# Patient Record
Sex: Male | Born: 1995 | Race: Black or African American | Hispanic: No | Marital: Single | Smoking: Current every day smoker
Health system: Southern US, Community
[De-identification: ages and names within clinical notes are randomized; demographics above are authoritative.]

## PROBLEM LIST (undated history)

## (undated) ENCOUNTER — Emergency Department (HOSPITAL_COMMUNITY): Admission: EM | Discharge status: Absent without leave | Payer: Medicaid Other

## (undated) VITALS — BP 131/80 | HR 113 | Temp 97.7°F | Resp 16

## (undated) VITALS — BP 130/81 | HR 78 | Temp 99.2°F | Resp 17 | Ht 73.0 in | Wt 220.0 lb

## (undated) DIAGNOSIS — F329 Major depressive disorder, single episode, unspecified: Secondary | ICD-10-CM

## (undated) DIAGNOSIS — F419 Anxiety disorder, unspecified: Secondary | ICD-10-CM

## (undated) DIAGNOSIS — F988 Other specified behavioral and emotional disorders with onset usually occurring in childhood and adolescence: Secondary | ICD-10-CM

## (undated) DIAGNOSIS — F32A Depression, unspecified: Secondary | ICD-10-CM

## (undated) DIAGNOSIS — Z9109 Other allergy status, other than to drugs and biological substances: Secondary | ICD-10-CM

## (undated) DIAGNOSIS — F259 Schizoaffective disorder, unspecified: Secondary | ICD-10-CM

---
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
## 2015-01-06 ENCOUNTER — Ambulatory Visit: Payer: Self-pay | Admitting: Internal Medicine

## 2015-04-21 ENCOUNTER — Ambulatory Visit: Payer: Medicaid Other | Attending: Internal Medicine | Admitting: Internal Medicine

## 2015-04-21 ENCOUNTER — Encounter: Payer: Self-pay | Admitting: Internal Medicine

## 2015-04-21 VITALS — BP 128/72 | HR 81 | Temp 98.8°F | Resp 16 | Ht 73.0 in | Wt 226.8 lb

## 2015-04-21 DIAGNOSIS — Z0000 Encounter for general adult medical examination without abnormal findings: Secondary | ICD-10-CM | POA: Diagnosis present

## 2015-04-21 LAB — LIPID PANEL
CHOL/HDL RATIO: 3.2 ratio (ref <=5.0)
CHOLESTEROL: 158 mg/dL (ref 125–170)
HDL: 50 mg/dL (ref 31–65)
LDL CALC: 100 mg/dL (ref <110)
Triglycerides: 40 mg/dL (ref 38–152)
VLDL: 8 mg/dL (ref <30)

## 2015-04-21 LAB — POCT URINALYSIS DIPSTICK
Bilirubin, UA: NEGATIVE
GLUCOSE UA: NEGATIVE
Ketones, UA: NEGATIVE
Leukocytes, UA: NEGATIVE
NITRITE UA: NEGATIVE
PH UA: 7.5
Protein, UA: NEGATIVE
RBC UA: NEGATIVE
SPEC GRAV UA: 1.015
UROBILINOGEN UA: 1

## 2015-04-21 LAB — COMPREHENSIVE METABOLIC PANEL
ALT: 38 U/L (ref 8–46)
AST: 23 U/L (ref 12–32)
Albumin: 4.4 g/dL (ref 3.6–5.1)
Alkaline Phosphatase: 69 U/L (ref 48–230)
BUN: 8 mg/dL (ref 7–20)
CHLORIDE: 102 mmol/L (ref 98–110)
CO2: 30 mmol/L (ref 20–31)
CREATININE: 1.01 mg/dL (ref 0.60–1.26)
Calcium: 10.5 mg/dL — ABNORMAL HIGH (ref 8.9–10.4)
GLUCOSE: 91 mg/dL (ref 65–99)
POTASSIUM: 5 mmol/L (ref 3.8–5.1)
SODIUM: 139 mmol/L (ref 135–146)
Total Bilirubin: 0.6 mg/dL (ref 0.2–1.1)
Total Protein: 6.9 g/dL (ref 6.3–8.2)

## 2015-04-21 LAB — CBC
HCT: 42.4 % (ref 39.0–52.0)
Hemoglobin: 14.7 g/dL (ref 13.0–17.0)
MCH: 28.8 pg (ref 26.0–34.0)
MCHC: 34.7 g/dL (ref 30.0–36.0)
MCV: 83 fL (ref 78.0–100.0)
MPV: 9.2 fL (ref 8.6–12.4)
PLATELETS: 241 10*3/uL (ref 150–400)
RBC: 5.11 MIL/uL (ref 4.22–5.81)
RDW: 13.4 % (ref 11.5–15.5)
WBC: 4.6 10*3/uL (ref 4.0–10.5)

## 2015-04-21 NOTE — Progress Notes (Signed)
Patient ID: Marvin Crawford, male   DOB: 11-01-1995, 19 y.o.   MRN: [id number]   [id number]:[id number]  YQM:[id number]  DOB - 01-Jul-1996  CC:  Chief Complaint  Patient presents with  . New Patient (Initial Visit)       HPI: Marvin Crawford is a 19 y.o. male here today to establish medical care.  Patient has a past medical history of major depressive disorder and ADHD. Patient has a severe stutter. He reports that he will be starting a new job with [employer] tomorrow and has been asked to come for a physical. 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 He denies drug use, tobacco use, and alcohol use. He is healthy and only takes Abilify and Remeron medications---given by [facility]. He has never had any surgeries. He is sexually active and uses protection every encounter.   No Known Allergies Past Medical History  Diagnosis Date  . Environmental allergies   . ADD (attention deficit disorder)    Current Outpatient Prescriptions on File Prior to Visit  Medication Sig Dispense Refill  . ARIPiprazole (ABILIFY) 10 MG tablet Take 1 tablet (10 mg total) by mouth daily. 30 tablet 1  . mirtazapine (REMERON) 30 MG tablet Take 1 tablet (30 mg total) by mouth at bedtime. 30 tablet 1   No current facility-administered medications on file prior to visit.   History reviewed. No pertinent family history. Social History   Social History  . Marital Status: Single    Spouse Name: N/A  . Number of Children: N/A  . Years of Education: N/A   Occupational History  . Not on file.   Social History Main Topics  . Smoking status: Never Smoker   . Smokeless tobacco: Not on file  . Alcohol Use: No  . Drug Use: No  . Sexual Activity: Not on file   Other Topics Concern  . Not on file   Social History Narrative    Review of Systems: Constitutional: Negative for fever, chills, diaphoresis, activity change, appetite change and fatigue. HENT: Negative for ear pain, nosebleeds, congestion, facial swelling,  rhinorrhea, neck pain, neck stiffness and ear discharge.  Eyes: Negative for pain, discharge, redness, itching and visual disturbance. Respiratory: Negative for cough, choking, chest tightness, shortness of breath, wheezing and stridor.  Cardiovascular: Negative for chest pain, palpitations and leg swelling. Gastrointestinal: Negative for abdominal distention. Genitourinary: Negative for dysuria, urgency, frequency, hematuria, flank pain, decreased urine volume, difficulty urinating and dyspareunia.  Musculoskeletal: Negative for back pain, joint swelling, arthralgia and gait problem. Neurological: Negative for dizziness, tremors, seizures, syncope, facial asymmetry, speech difficulty, weakness, light-headedness, numbness and headaches.  Hematological: Negative for adenopathy. Does not bruise/bleed easily. Psychiatric/Behavioral: Negative for hallucinations, behavioral problems, confusion, dysphoric mood, decreased concentration and agitation.    Objective:   Filed Vitals:   04/21/15 0934  BP: 128/72  Pulse: 81  Temp: 98.8 F (37.1 C)  Resp: 16    Physical Exam: Constitutional: Patient appears well-developed and well-nourished. No distress. HENT: Normocephalic, atraumatic, External right and left ear normal. Oropharynx is clear and moist.  Eyes: Conjunctivae and EOM are normal. PERRLA, no scleral icterus. Neck: Normal ROM. Neck supple. No JVD. No tracheal deviation. No thyromegaly. CVS: RRR, S1/S2 +, no murmurs, no gallops, no carotid bruit.  Pulmonary: Effort and breath sounds normal, no stridor, rhonchi, wheezes, rales.  Abdominal: Soft. BS +, no distension, tenderness, rebound or guarding.  Musculoskeletal: Normal range of motion. No edema and no tenderness.  Neuro: Alert. Normal reflexes, muscle tone  coordination. No cranial nerve deficit. Skin: Skin is warm and dry. No rash noted. Not diaphoretic. No erythema. No pallor. Psychiatric: Normal mood and affect. Behavior, judgment,  thought content normal.  Lab Results  Component Value Date   WBC 7.1 02/16/2013   HGB 15.0 02/16/2013   HCT 42.0 02/16/2013   MCV 82.0 02/16/2013   PLT 258 02/16/2013   Lab Results  Component Value Date   CREATININE 0.96 02/17/2013   BUN 10 02/17/2013   NA 139 02/17/2013   K 3.9 02/17/2013   CL 104 02/17/2013   CO2 26 02/17/2013    Lab Results  Component Value Date   HGBA1C 5.5 02/17/2013   Lipid Panel     Component Value Date/Time   CHOL 135 02/17/2013 0650   TRIG 36 02/17/2013 0650   HDL 45 02/17/2013 0650   CHOLHDL 3.0 02/17/2013 0650   VLDL 7 02/17/2013 0650   LDLCALC 83 02/17/2013 0650       Assessment and plan:   [name] was seen today for new patient (initial visit).  Diagnoses and all orders for this visit:  Annual physical exam -     CBC -     Comprehensive metabolic panel -     Lipid panel -     POCT urinalysis dipstick -     GC/chlamydia probe amp, urine   Return if symptoms worsen or fail to improve.   Ambrose Finland, NP-C [facility] [phone] 04/21/2015, 9:50 AM

## 2015-04-21 NOTE — Patient Instructions (Signed)
If you need papers signed for work please let me know and I will complete for you

## 2015-04-21 NOTE — Progress Notes (Signed)
Patient here to establish care Patient states he is here just for a check up

## 2015-04-22 LAB — GC/CHLAMYDIA PROBE AMP, URINE
CHLAMYDIA, SWAB/URINE, PCR: NEGATIVE
GC Probe Amp, Urine: NEGATIVE

## 2015-04-26 ENCOUNTER — Telehealth: Payer: Self-pay

## 2015-04-26 NOTE — Telephone Encounter (Signed)
-----   Message from Ambrose Finland, NP sent at 04/23/2015  6:38 PM EDT ----- Negative STD

## 2015-04-26 NOTE — Telephone Encounter (Signed)
Patient not available  Left message with mom to have him return our call

## 2015-08-05 ENCOUNTER — Encounter (HOSPITAL_COMMUNITY): Payer: Self-pay | Admitting: *Deleted

## 2015-08-05 ENCOUNTER — Encounter (HOSPITAL_COMMUNITY): Payer: Self-pay | Admitting: Emergency Medicine

## 2015-08-05 ENCOUNTER — Emergency Department (HOSPITAL_COMMUNITY)
Admission: EM | Admit: 2015-08-05 | Discharge: 2015-08-05 | Disposition: A | Discharge status: Other Acute Inpatient Hospital | Payer: Medicaid Other | Attending: Emergency Medicine | Admitting: Emergency Medicine

## 2015-08-05 ENCOUNTER — Inpatient Hospital Stay (HOSPITAL_COMMUNITY)
Admission: AD | Admit: 2015-08-05 | Discharge: 2015-08-13 | DRG: 885 | Disposition: A | Discharge status: Home / Self Care | Payer: Medicaid Other | Source: Intra-hospital | Attending: Psychiatry | Admitting: Psychiatry

## 2015-08-05 DIAGNOSIS — Z79899 Other long term (current) drug therapy: Secondary | ICD-10-CM | POA: Diagnosis not present

## 2015-08-05 DIAGNOSIS — F1210 Cannabis abuse, uncomplicated: Secondary | ICD-10-CM | POA: Diagnosis not present

## 2015-08-05 DIAGNOSIS — F800 Phonological disorder: Secondary | ICD-10-CM | POA: Diagnosis present

## 2015-08-05 DIAGNOSIS — F411 Generalized anxiety disorder: Secondary | ICD-10-CM | POA: Diagnosis present

## 2015-08-05 DIAGNOSIS — F332 Major depressive disorder, recurrent severe without psychotic features: Secondary | ICD-10-CM

## 2015-08-05 DIAGNOSIS — F333 Major depressive disorder, recurrent, severe with psychotic symptoms: Secondary | ICD-10-CM | POA: Diagnosis present

## 2015-08-05 DIAGNOSIS — F329 Major depressive disorder, single episode, unspecified: Secondary | ICD-10-CM | POA: Insufficient documentation

## 2015-08-05 DIAGNOSIS — F8081 Childhood onset fluency disorder: Secondary | ICD-10-CM | POA: Diagnosis present

## 2015-08-05 DIAGNOSIS — R45851 Suicidal ideations: Secondary | ICD-10-CM | POA: Diagnosis present

## 2015-08-05 DIAGNOSIS — Z62810 Personal history of physical and sexual abuse in childhood: Secondary | ICD-10-CM | POA: Diagnosis present

## 2015-08-05 DIAGNOSIS — F909 Attention-deficit hyperactivity disorder, unspecified type: Secondary | ICD-10-CM | POA: Diagnosis present

## 2015-08-05 DIAGNOSIS — R4183 Borderline intellectual functioning: Secondary | ICD-10-CM | POA: Diagnosis present

## 2015-08-05 DIAGNOSIS — F32A Depression, unspecified: Secondary | ICD-10-CM

## 2015-08-05 DIAGNOSIS — Z8249 Family history of ischemic heart disease and other diseases of the circulatory system: Secondary | ICD-10-CM

## 2015-08-05 DIAGNOSIS — Z23 Encounter for immunization: Secondary | ICD-10-CM

## 2015-08-05 DIAGNOSIS — G4700 Insomnia, unspecified: Secondary | ICD-10-CM | POA: Diagnosis present

## 2015-08-05 DIAGNOSIS — F1220 Cannabis dependence, uncomplicated: Secondary | ICD-10-CM | POA: Diagnosis present

## 2015-08-05 HISTORY — DX: Major depressive disorder, single episode, unspecified: F32.9

## 2015-08-05 HISTORY — DX: Depression, unspecified: F32.A

## 2015-08-05 HISTORY — DX: Anxiety disorder, unspecified: F41.9

## 2015-08-05 LAB — RAPID URINE DRUG SCREEN, HOSP PERFORMED
Amphetamines: NOT DETECTED
Barbiturates: NOT DETECTED
Benzodiazepines: NOT DETECTED
Cocaine: NOT DETECTED
Opiates: NOT DETECTED
Tetrahydrocannabinol: POSITIVE — AB

## 2015-08-05 LAB — COMPREHENSIVE METABOLIC PANEL
ALBUMIN: 4.3 g/dL (ref 3.5–5.0)
ALK PHOS: 77 U/L (ref 38–126)
ALT: 17 U/L (ref 17–63)
ANION GAP: 8 (ref 5–15)
AST: 18 U/L (ref 15–41)
BILIRUBIN TOTAL: 0.5 mg/dL (ref 0.3–1.2)
BUN: 12 mg/dL (ref 6–20)
CALCIUM: 10.1 mg/dL (ref 8.9–10.3)
CO2: 28 mmol/L (ref 22–32)
Chloride: 104 mmol/L (ref 101–111)
Creatinine, Ser: 1.13 mg/dL (ref 0.61–1.24)
GFR calc Af Amer: >60 mL/min (ref >60)
GLUCOSE: 113 mg/dL — AB (ref 65–99)
POTASSIUM: 3.8 mmol/L (ref 3.5–5.1)
Sodium: 140 mmol/L (ref 135–145)
TOTAL PROTEIN: 7 g/dL (ref 6.5–8.1)

## 2015-08-05 LAB — CBC
HEMATOCRIT: 44.4 % (ref 39.0–52.0)
Hemoglobin: 15.3 g/dL (ref 13.0–17.0)
MCH: 29.1 pg (ref 26.0–34.0)
MCHC: 34.5 g/dL (ref 30.0–36.0)
MCV: 84.4 fL (ref 78.0–100.0)
PLATELETS: 238 10*3/uL (ref 150–400)
RBC: 5.26 MIL/uL (ref 4.22–5.81)
RDW: 12.5 % (ref 11.5–15.5)
WBC: 6.1 10*3/uL (ref 4.0–10.5)

## 2015-08-05 LAB — SALICYLATE LEVEL: Salicylate Lvl: <4 mg/dL (ref 2.8–30.0)

## 2015-08-05 LAB — ETHANOL

## 2015-08-05 LAB — ACETAMINOPHEN LEVEL

## 2015-08-05 MED ORDER — ALUM & MAG HYDROXIDE-SIMETH 200-200-20 MG/5ML PO SUSP: 30.0000 mL | ORAL | Status: DC | PRN

## 2015-08-05 MED ORDER — HYDROXYZINE HCL 25 MG PO TABS
25.0000 mg | ORAL_TABLET | Freq: Four times a day (QID) | ORAL | Status: DC | PRN
  Administered 2015-08-05 – 2015-08-08 (×4): 25 mg via ORAL
  Filled 2015-08-05 (×5): qty 1

## 2015-08-05 MED ORDER — ZOLPIDEM TARTRATE 5 MG PO TABS: 5.0000 mg | ORAL_TABLET | Freq: Every evening | ORAL | Status: DC | PRN

## 2015-08-05 MED ORDER — ARIPIPRAZOLE 10 MG PO TABS
10.0000 mg | ORAL_TABLET | Freq: Every day | ORAL | Status: DC
  Administered 2015-08-05 – 2015-08-09 (×5): 10 mg via ORAL
  Filled 2015-08-05 (×9): qty 1

## 2015-08-05 MED ORDER — LORAZEPAM 1 MG PO TABS
1.0000 mg | ORAL_TABLET | Freq: Three times a day (TID) | ORAL | Status: DC | PRN
  Administered 2015-08-05: 1 mg via ORAL
  Filled 2015-08-05: qty 1

## 2015-08-05 MED ORDER — ONDANSETRON HCL 4 MG PO TABS: 4.0000 mg | ORAL_TABLET | Freq: Three times a day (TID) | ORAL | Status: DC | PRN

## 2015-08-05 MED ORDER — IBUPROFEN 200 MG PO TABS: 600.0000 mg | ORAL_TABLET | Freq: Three times a day (TID) | ORAL | Status: DC | PRN

## 2015-08-05 MED ORDER — MAGNESIUM HYDROXIDE 400 MG/5ML PO SUSP
30.0000 mL | Freq: Every day | ORAL | Status: DC | PRN
Start: 2015-08-05 — End: 2015-08-13

## 2015-08-05 MED ORDER — ACETAMINOPHEN 325 MG PO TABS: 650.0000 mg | ORAL_TABLET | Freq: Four times a day (QID) | ORAL | Status: DC | PRN

## 2015-08-05 MED ORDER — NICOTINE 21 MG/24HR TD PT24: 21.0000 mg | MEDICATED_PATCH | Freq: Every day | TRANSDERMAL | Status: DC

## 2015-08-05 MED ORDER — INFLUENZA VAC SPLIT QUAD 0.5 ML IM SUSY
0.5000 mL | PREFILLED_SYRINGE | INTRAMUSCULAR | Status: DC
  Filled 2015-08-05: qty 0.5

## 2015-08-05 MED ORDER — ACETAMINOPHEN 325 MG PO TABS: 650.0000 mg | ORAL_TABLET | ORAL | Status: DC | PRN

## 2015-08-05 MED ORDER — MIRTAZAPINE 30 MG PO TABS
30.0000 mg | ORAL_TABLET | Freq: Every day | ORAL | Status: DC
Start: 2015-08-05 — End: 2015-08-09
  Administered 2015-08-05 – 2015-08-08 (×4): 30 mg via ORAL
  Filled 2015-08-05 (×4): qty 1
  Filled 2015-08-05: qty 2
  Filled 2015-08-05 (×3): qty 1

## 2015-08-05 NOTE — [facility] Group Notes (Signed)
[facility] 08/05/2015 1:15pm  Type of Therapy: Mental Health Association Presentation  Participation Level: Patient was called out of group after 15mins of group starting.  Nira Retortelilah Dixie Coppa, MSW, LCSW Clinical Social Worker

## 2015-08-05 NOTE — BH Assessment (Addendum)
Tele Assessment Note  Marvin Crawford is a 19 y.o. African that reports passive suicidal ideation without a plan.     Documentation in the epic chart reports that the patient he called 911 tonight and asked to be taken to jail because he thought that he was mean to his cousin.   Mother states that he is becoming slightly more disorganized and unresponsive and his thoughts lately.  Patient reports suing marijuana today.  Patient reports that he has been smoking marijuana for a year or so. Patient reports that he smokes every on the day.   Patient reports that he only spends about $10.  Patient was brought to the ED by the police because the patient stated to the officer that, he kind of wanted to kill himself.  Patient reports increased depression due to the families' financial constraints.  Patient is also having difficulty with the transition from graduating  high school this year and now working.   His mother reports that he has not taken his medication in 6 months. Patient reports that he receives services from [facility] and has an appointment on August 16, 2015.   Patient reports a psychiatric hospitalization when he was 19 years old due to behavioral issues.  Patient reports that he resides with his mother and brother.  Patient reports having a very supportive family.   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   Patient denies physical, sexual or emotional abuse.   Patient denies HI and Psychosis.     Diagnosis: Major Depressive Disorder   Past Medical History:  Past Medical History  Diagnosis Date  . Environmental allergies   . ADD (attention deficit disorder)   . Depression     History reviewed. No pertinent past surgical history.  Family History: History reviewed. No pertinent family history.  Social History:  reports that he has never smoked. He does not have any smokeless tobacco history on file. He reports that he  does not drink alcohol or use illicit drugs.  Additional Social History:  Alcohol / Drug Use History of alcohol / drug use?: Yes Longest period of sobriety (when/how long): Patient reports, "years" Negative Consequences of Use:  (None Reported) Withdrawal Symptoms:  (None Reported) Substance #1 Name of Substance 1: Marijuanna  1 - Age of First Use: 18 1 - Amount (size/oz): one joint  1 - Frequency: Every other day  1 - Duration: For the past year or so 1 - Last Use / Amount: Today   CIWA: CIWA-Ar BP: 118/66 mmHg Pulse Rate: 88 COWS:    PATIENT STRENGTHS: (choose at least two) Average or above average intelligence Capable of independent living Communication skills Financial means Supportive family/friends Work skills  Allergies: No Known Allergies  Home Medications:  (Not in a hospital admission)  OB/GYN Status:  No LMP for male patient.  General Assessment Data Location of Assessment: [facility] TTS Assessment: In system Is this a Tele or Face-to-Face Assessment?: Tele Assessment Is this an Initial Assessment or a Re-assessment for this encounter?: Initial Assessment Marital status: Single Maiden name: NA Is patient pregnant?: No Pregnancy Status: No Living Arrangements: Parent (Mother) Can pt return to current living arrangement?: Yes Admission Status: Voluntary Is patient capable of signing voluntary admission?: Yes Referral Source: Self/Family/Friend Insurance type: Medicaid  Medical Screening Exam [facility] Walk-in ONLY) Medical Exam completed: Yes  Crisis Care Plan Living Arrangements: Parent (Mother) Name of Psychiatrist: [facility]  Name of Therapist: [facility]  Education Status Is patient currently in school?: No Current Grade: NA Highest grade of school patient has completed: 12TH Name of school: [employer] person: NA  Risk to self with the past 6 months Suicidal Ideation: Yes-Currently Present Has patient been a risk to self  within the past 6 months prior to admission? : No Suicidal Intent: No Has patient had any suicidal intent within the past 6 months prior to admission? : No Is patient at risk for suicide?: No Suicidal Plan?: No Has patient had any suicidal plan within the past 6 months prior to admission? : No Access to Means: No What has been your use of drugs/alcohol within the last 12 months?: Marijuanna Previous Attempts/Gestures: No How many times?: 0 Other Self Harm Risks: None Reported Triggers for Past Attempts: None known Intentional Self Injurious Behavior: None Family Suicide History: No Recent stressful life event(s): Financial Problems Persecutory voices/beliefs?: No Depression: Yes Depression Symptoms: Despondent, Loss of interest in usual pleasures, Feeling worthless/self pity Substance abuse history and/or treatment for substance abuse?: Yes Suicide prevention information given to non-admitted patients: Yes  Risk to Others within the past 6 months Homicidal Ideation: No Does patient have any lifetime risk of violence toward others beyond the six months prior to admission? : No Thoughts of Harm to Others: No Current Homicidal Intent: No Current Homicidal Plan: No Access to Homicidal Means: No Identified Victim: NA History of harm to others?: No Assessment of Violence: None Noted Violent Behavior Description: NA Does patient have access to weapons?: No Criminal Charges Pending?: No Does patient have a court date: No Is patient on probation?: No  Psychosis Hallucinations: None noted Delusions: None noted  Mental Status Report Appearance/Hygiene: In scrubs Eye Contact: Good Motor Activity: Freedom of movement, Restlessness Speech: Logical/coherent Level of Consciousness: Alert Mood: Depressed Affect: Appropriate to circumstance Anxiety Level: None Thought Processes: Coherent, Relevant Judgement: Unimpaired Orientation: Place, Person, Situation, Time Obsessive  Compulsive Thoughts/Behaviors: None  Cognitive Functioning Concentration: Decreased Memory: Recent Intact, Remote Intact IQ: Average Insight: Fair Impulse Control: Fair Appetite: Fair Weight Loss: 0 Weight Gain: 0 Sleep: No Change Total Hours of Sleep: 8 Vegetative Symptoms: None  ADLScreening [facility] Assessment Services) Patient's cognitive ability adequate to safely complete daily activities?: Yes Patient able to express need for assistance with ADLs?: Yes Independently performs ADLs?: Yes (appropriate for developmental age)  Prior Inpatient Therapy Prior Inpatient Therapy: Yes Prior Therapy Dates: 2014 Prior Therapy Facilty/Provider(s): [facility] Reason for Treatment: Behavioral   Prior Outpatient Therapy Prior Outpatient Therapy: Yes Prior Therapy Dates: Ongoing  Prior Therapy Facilty/Provider(s): [facility] Reason for Treatment: Meds and Therapy Does patient have an ACCT team?: No Does patient have Intensive In-House Services?  : No Does patient have [facility] services? : No Does patient have P4CC services?: No  ADL Screening (condition at time of admission) Patient's cognitive ability adequate to safely complete daily activities?: Yes Is the patient deaf or have difficulty hearing?: No Does the patient have difficulty seeing, even when wearing glasses/contacts?: No Does the patient have difficulty concentrating, remembering, or making decisions?: No Patient able to express need for assistance with ADLs?: Yes Does the patient have difficulty dressing or bathing?: No Independently performs ADLs?: Yes (appropriate for developmental age) Does the patient have difficulty walking or climbing stairs?: No Weakness of Legs: None Weakness of Arms/Hands: None  Home Assistive Devices/Equipment Home Assistive Devices/Equipment: None    Abuse/Neglect Assessment (Assessment to be complete while patient is alone) Physical  Abuse: Denies Verbal Abuse: Denies Sexual Abuse:  Denies Exploitation of patient/patient's resources: Denies Self-Neglect: Denies Values / Beliefs Cultural Requests During Hospitalization: None Spiritual Requests During Hospitalization: None Consults Spiritual Care Consult Needed: No Social Work Consult Needed: No Merchant navy officer (For Healthcare) Does patient have an advance directive?: No Would patient like information on creating an advanced directive?: No - patient declined information    Additional Information 1:1 In Past 12 Months?: No CIRT Risk: No Elopement Risk: No Does patient have medical clearance?: Yes     Disposition: Per Jamision, NP - patient meets criteria for inpatient hospitalization.  Per [facility] Everardo Pacific) patient can come to [facility] after 7:30am.  Patient accepted to [facility] Bed 404-2.   Disposition Initial Assessment Completed for this Encounter: Yes  Phillip Heal LaVerne 08/05/2015 3:57 AM

## 2015-08-05 NOTE — ED Notes (Signed)
Pt brought in by [employer] voluntarily  Pt has depression and tonight kept saying he wanted to go to jail  Pt states he has had thoughts of hurting himself "kinda"  Pt is calm and cooperative  Poor eye contact

## 2015-08-05 NOTE — ED Notes (Signed)
Telepsych in progress. 

## 2015-08-05 NOTE — ED Notes (Signed)
Marvin Crawford Angelngela Knotts (Mother) [phone]

## 2015-08-05 NOTE — BH Assessment (Signed)
Per Jamision, NP - patient meets criteria for inpatient hospitalization. [facility]. [facility]. Writer informed the patient, his mother rand the nurse of the disposition.

## 2015-08-05 NOTE — ED Notes (Signed)
[facility]  RN to call ED RN back for report.

## 2015-08-05 NOTE — ED Notes (Signed)
Pt has in belonging bag: green jacket, pt's mother took the rest of the belongings home
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
## 2015-08-05 NOTE — Tx Team (Signed)
Initial Interdisciplinary Treatment Plan   PATIENT STRESSORS: Educational concerns Financial difficulties Occupational concerns Substance abuse   PATIENT STRENGTHS: Ability for insight Average or above average intelligence Communication skills General fund of knowledge Motivation for treatment/growth Supportive family/friends Work skills   PROBLEM LIST: Problem List/Patient Goals Date to be addressed Date deferred Reason deferred Estimated date of resolution  "anxiety" 08/05/2015   D/c  "depression" 08/05/2015   D/c  "suicidal thoughts" 08/05/2015   D/c  "substance abuse" 08/05/2015   D/c                                 DISCHARGE CRITERIA:  Ability to meet basic life and health needs Adequate post-discharge living arrangements Improved stabilization in mood, thinking, and/or behavior Medical problems require only outpatient monitoring Motivation to continue treatment in a less acute level of care Need for constant or close observation no longer present Reduction of life-threatening or endangering symptoms to within safe limits Safe-care adequate arrangements made Verbal commitment to aftercare and medication compliance Withdrawal symptoms are absent or subacute and managed without 24-hour nursing intervention  PRELIMINARY DISCHARGE PLAN: Attend aftercare/continuing care group Attend PHP/IOP Attend 12-step recovery group Outpatient therapy Return to previous living arrangement Return to previous work or school arrangements  PATIENT/FAMIILY INVOLVEMENT: This treatment plan has been presented to and reviewed with the patient, Marvin Crawford.  The patient and family have been given the opportunity to ask questions and make suggestions.  Earline MayotteKnight, Ludmila Ebarb Shephard 08/05/2015, 11:25 AM
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
## 2015-08-05 NOTE — Progress Notes (Signed)
Adult Psychoeducational Group Note  Date:  08/05/2015 Time:  10:44 PM  Group Topic/Focus:  Wrap-Up Group:   The focus of this group is to help patients review their daily goal of treatment and discuss progress on daily workbooks.  Participation Level:  Active  Participation Quality:  Appropriate  Affect:  Appropriate  Cognitive:  Alert  Insight: Appropriate  Engagement in Group:  Engaged  Modes of Intervention:  Discussion  Additional Comments:  Patient stated his goal for today was to get prepared. Patient stated something positive that happened today was him meeting new friends.   Blenda Wisecup L Harbor Paster 08/05/2015, 10:44 PM
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
## 2015-08-05 NOTE — ED Notes (Signed)
[facility] Rm [id number]  [facility] transport (April) notified of voluntary transport.

## 2015-08-05 NOTE — ED Notes (Signed)
Nurse Tech Candis Musahris Taylor sitting with pt.  Pt talking to him about his marijuana use and not taking his medications, and his job.  Pt refuses to talk with this male Charity fundraiserN.

## 2015-08-05 NOTE — ED Notes (Signed)
Pt's mom will take his belongings home with her.

## 2015-08-06 NOTE — Progress Notes (Deleted)
[facility]. Bed should be available this afternoon. Rosey BathKelly Djimon Lundstrom, RN

## 2015-08-06 NOTE — Progress Notes (Signed)
Patient seen watching TV on Dayroom. Comes to nurses station several times requesting to the see this Clinical research associatewriter. Patient will walk away as soon as the writer comes and stated with a smile "never mind. It's nothing". He was verbally redirected from doing that after 5 times. Requested for sleeping pill and went to bed. Patient woke up at 3 am stated "I'm tired of sleeping. I want to be awake". Patient encourged to go back to bed.  Offered support and encouragement as needed. Due medications given as ordered. Every 15 minutes check for safety maintained. Will continue to monitor patient for safety and stability.

## 2015-08-06 NOTE — [facility] Group Notes (Signed)
[facility] LCSW Group Therapy  08/06/2015 2:27 PM  Type of Therapy:  Group Therapy   Patient did not attend group and declined invitation.     PICKETT JR, Legrand Lasser C 08/06/2015, 2:27 PM

## 2015-08-06 NOTE — Progress Notes (Signed)
D: Patient denies pain, SI,AH/VH at this time. Continues to exhibit some bizarre attitude and seems to have thought blocking. Asked for vistaril for anxiety and his scheduled Remeron taken together. Patient refused the vistaril after starring at it for sometime stated "I don't need it. I am not anxious, In fact, I am ok. Will take it tomorrow. Took the Remeron and walked away".  A: Offered support and encouragement as needed. Every 15 minutes check for safety maintained. Will continue to monitor patient for safety and stability.  R: Patient remains safe.

## 2015-08-06 NOTE — [facility] Group Notes (Signed)
[facility]   08/06/2015 9:58 AM  Participation Quality:  Patient did not attend group and declined invitation.     PICKETT JR, Virda Betters C
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
## 2015-08-06 NOTE — Progress Notes (Signed)
Marvin Crawford has Marvin bizarre affect in that when he makes eye contact, he maintains his gaze ...even after speaking to you. He takes his morning medications without diff and then immediately afterwards, he was observed asking other staff to " help" him with ( STD testing, HIV testing and "anal" problems" ). When this writer was notified by staff he asked for help from.., writer went to him and asked him to clarify. He said " oh..I  don';t know..I  just thought I might need those tests". Dr. Jama Crawford notified in treatment team this morning and to follow up with patient.  Marvin Crawford completed his daily assessment and on it he wrote he denied having SI today and he rated his depression, hopelessness and anxiety " 0/0/0", respectively.Affect is depressed. He demonstrates slow processing speed when he is speaking. He has diff articulating his thoughts and is delayed in his response time.  R Safety is in place.
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
## 2015-08-06 NOTE — Progress Notes (Signed)
Adult Psychoeducational Group Note  Date:  08/06/2015 Time:  9:09 PM  Group Topic/Focus:  Wrap-Up Group:   The focus of this group is to help patients review their daily goal of treatment and discuss progress on daily workbooks.  Participation Level:  Active  Participation Quality:  Appropriate  Affect:  Appropriate  Cognitive:  Alert  Insight: Appropriate  Engagement in Group:  Engaged  Modes of Intervention:  Discussion  Additional Comments:  Patient stated having a good day. Patient stated his mom and sister came to visit.   [name] L [name] 08/06/2015, 9:09 PM

## 2015-08-06 NOTE — Tx Team (Signed)
Interdisciplinary Treatment Plan Update (Adult) Date: 08/06/2015   Date: 08/06/2015 3:03 PM  Progress in Treatment:  Attending groups: No Participating in groups: No Taking medication as prescribed: Yes  Tolerating medication: Yes  Family/Significant othe contact made: No, CSW attempting to make contact with mother Patient understands diagnosis: Continuing to assess Discussing patient identified problems/goals with staff: Yes  Medical problems stabilized or resolved: Yes  Denies suicidal/homicidal ideation: Yes Patient has not harmed self or Others: Yes   New problem(s) identified: None identified at this time.   Discharge Plan or Barriers: CSW will assess for appropriate discharge plan and relevant barriers.   Additional comments:  Patient and CSW reviewed pt's identified goals and treatment plan. Patient verbalized understanding and agreed to treatment plan. CSW reviewed [facility] "Discharge Process and Patient Involvement" Form. Pt verbalized understanding of information provided and signed form.   Reason for Continuation of Hospitalization:  Depression Medication stabilization Suicidal ideation Psychotic  Estimated length of stay: 3-5 days  Review of initial/current patient goals per problem list:   1.  Goal(s): Patient will participate in aftercare plan  Met:  No  Target date: 3-5 days from date of admission   As evidenced by: Patient will participate within aftercare plan AEB aftercare provider and housing plan at discharge being identified.  08/06/15: CSW to work with Pt to assess for appropriate discharge plan and faciliate appointments and referrals as needed prior to d/c.  2.  Goal (s): Patient will exhibit decreased depressive symptoms and suicidal ideations.  Met:  Yes  Target date: 3-5 days from date of admission   As evidenced by: Patient will utilize self rating of depression at 3 or below and demonstrate decreased signs of depression or be deemed stable for  discharge by MD.  08/06/15: Pt rates depression at 0/10; denies SI at this time  5.  Goal(s): Patient will demonstrate decreased signs of psychosis  . Met:  No . Target date: 3-5 days from date of admission  . As evidenced by: Patient will demonstrate decreased frequency of AVH or return to baseline function    -08/06/15: Pt continues to present    with bizarre affect and disorganized    thoughts.   Attendees:  Patient:    Family:    Physician: Dr. Parke Poisson, MD  08/06/2015 3:03 PM  Nursing:   08/06/2015 3:03 PM  Clinical Social Worker Peri Maris, [place] 08/06/2015 3:03 PM  Other: Tilden Fossa, [place] 08/06/2015 3:03 PM  Clinical: Sandre Kitty, RN 08/06/2015 3:03 PM  Other: , RN Charge Nurse 08/06/2015 3:03 PM  Other: Hilda Lias, [place], [facility] Social Work [phone]

## 2015-08-07 DIAGNOSIS — F333 Major depressive disorder, recurrent, severe with psychotic symptoms: Principal | ICD-10-CM

## 2015-08-07 LAB — HIV ANTIBODY (ROUTINE TESTING W REFLEX): HIV SCREEN 4TH GENERATION: NONREACTIVE

## 2015-08-07 LAB — RPR: RPR: NONREACTIVE

## 2015-08-07 MED ORDER — IBUPROFEN 600 MG PO TABS: 600.0000 mg | ORAL_TABLET | Freq: Three times a day (TID) | ORAL | Status: DC | PRN

## 2015-08-07 MED ORDER — ALUM & MAG HYDROXIDE-SIMETH 200-200-20 MG/5ML PO SUSP
30.0000 mL | ORAL | Status: DC | PRN
  Filled 2015-08-07: qty 30

## 2015-08-07 MED ORDER — ZOLPIDEM TARTRATE 5 MG PO TABS: 5.0000 mg | ORAL_TABLET | Freq: Every evening | ORAL | Status: DC | PRN

## 2015-08-07 MED ORDER — NICOTINE 21 MG/24HR TD PT24
21.0000 mg | MEDICATED_PATCH | Freq: Every day | TRANSDERMAL | Status: DC
  Administered 2015-08-08 – 2015-08-13 (×5): 21 mg via TRANSDERMAL
  Filled 2015-08-07 (×10): qty 1

## 2015-08-07 MED ORDER — LORAZEPAM 1 MG PO TABS
1.0000 mg | ORAL_TABLET | Freq: Three times a day (TID) | ORAL | Status: DC | PRN
  Administered 2015-08-11: 1 mg via ORAL
  Filled 2015-08-07: qty 1

## 2015-08-07 MED ORDER — ONDANSETRON HCL 4 MG PO TABS
4.0000 mg | ORAL_TABLET | Freq: Three times a day (TID) | ORAL | Status: DC | PRN
  Administered 2015-08-08: 4 mg via ORAL
  Filled 2015-08-07: qty 1

## 2015-08-07 MED ORDER — ACETAMINOPHEN 325 MG PO TABS: 650.0000 mg | ORAL_TABLET | ORAL | Status: DC | PRN

## 2015-08-07 NOTE — Progress Notes (Signed)
D) Pt was on the 400 hall and was moved to the 500 which is more appropriate for him. Since moving there Pt. Has opened up and is talking with the other Patients. Affect is brighter and Pt is more involved in his care. Pt rates his depression at a 7, his hopelessness at a 10 and his anxiety at a 5. Denies SI and HI. A) Pt given support, reassurance and praise. Pt moved to the 500 hall due to his inability to freely interact with the folks on the 400 hall R) Pt interacting, affect brighter and Pt feeling a part of the milieu on 500.
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
## 2015-08-07 NOTE — [facility] Group Notes (Signed)
[facility] Group Notes:  (Clinical Social Work)   08/07/2015 10:00-11:00AM  Summary of Progress/Problems: In today's process group a decisional balance exercise was used to explore in depth the perceived benefits and costs of unhealthy coping techniques, as well as the benefits and costs of replacing these with healthy coping skills. Among the unhealthy coping techniques listed by the group were drinking, isolating, overtaking pain medications, directing anger inward, trying to control everything, stopping medications, denying grief, rumination, and anger outbursts. Motivational Interviewing and the whiteboard were utilized for the exercises. The patient stated he needs to stay on his medications when he leaves the hospital in order to stay well.  He did not talk during group, but seemed to listen.  After group, CSW asked him what he had learned, and he said "to be vulnerable."  Type of Therapy:  Group Therapy - Process   Participation Level:  Active  Participation Quality:  Attentive  Affect:  Blunted  Cognitive:  Seems to have limited understanding  Insight:  Limited  Engagement in Therapy:  Engaged  Modes of Intervention:  Education, Motivational Interviewing  Ambrose MantleMareida Grossman-Orr, LCSW 08/07/2015, 12:33 PM

## 2015-08-07 NOTE — Progress Notes (Signed)
Patient ID: Marvin Crawford, male   DOB: March 21, 1996, 19 y.o.   MRN: [id number] D: Client visible on the unit, seen visiting with mom today, in day room watching TV and on the phone several times. Client smiling today, reports no problems with medication. Client denies AVH,  A: Writer encourages client to report any concerns, reviews medications, administered as ordered. Staff will monitor q9815min for safety. R: Client is safe on the unit, attended group.

## 2015-08-07 NOTE — Progress Notes (Signed)
[facility] Group Notes:  (Nursing/MHT/Case Management/Adjunct)  Date:  08/07/2015  Time:  9:26 PM  Type of Therapy:  Psychoeducational Skills  Participation Level:  Active  Participation Quality:  Attentive  Affect:  Blunted  Cognitive:  Appropriate  Insight:  Appropriate  Engagement in Group:  Developing/Improving  Modes of Intervention:  Education  Summary of Progress/Problems: Patient states that he had a good day overall since he was able to go to the gym to play basketball and because he had a good family visit this evening (mother,brother, and sister). As for the theme of the day, his coping skill will be to talk more often and to use "deep breathing".   Hazle CocaGOODMAN, Saydie Gerdts S 08/07/2015, 9:26 PM

## 2015-08-08 NOTE — Progress Notes (Signed)
Patient ID: Marvin Crawford, male   DOB: 06/30/1996, 20 y.o.   MRN: [id number]  [facility] MD Progress Note  08/08/2015  Marvin Crawford  MRN:  [id number] Subjective:   Patient reports "I feel so much better today. The voices are much less than they were yesterday."  Objective: Pt seen and chart reviewed. Pt is alert/oriented x4, calm, cooperative, and appropriate to situation. Pt denies suicidal/homicidal ideation and does not appear to be responding to internal stimuli. Pt continues to affirm psychosis but reports that it is nearly gone. Reports intermitent hearing voices. He is behaving well on the unit.   Principal Problem: MDD (major depressive disorder), recurrent, severe, with psychosis (HCC) Diagnosis:   Patient Active Problem List   Diagnosis Date Noted  . MDD (major depressive disorder), recurrent episode, severe (HCC) [F33.2] 08/05/2015  . Cannabis dependence (HCC) [F12.20] 08/05/2015  . Alcohol abuse [F10.10] 02/17/2013  . MDD (major depressive disorder), recurrent, severe, with psychosis (HCC) [F33.3] 02/16/2013   Total Time spent with patient: 20 minutes    Past Medical History:  Past Medical History  Diagnosis Date  . Environmental allergies   . ADD (attention deficit disorder)   . Depression   . Anxiety    History reviewed. No pertinent past surgical history. Family History: History reviewed. No pertinent family history.  Social History:  History  Alcohol Use No     History  Drug Use  . Yes  . Special: Other-see comments    Comment: oil smoked in pipe calls "DAB", friends buy for him    Social History   Social History  . Marital Status: Single    Spouse Name: N/A  . Number of Children: N/A  . Years of Education: N/A   Social History Main Topics  . Smoking status: Never Smoker   . Smokeless tobacco: None  . Alcohol Use: No  . Drug Use: Yes    Special: Other-see comments     Comment: oil smoked in pipe calls "DAB", friends buy for him  . Sexual Activity: Yes     Birth Control/ Protection: Condom   Other Topics Concern  . None   Social History Narrative   Additional Social History:    Pain Medications: none Prescriptions: abikify    remeron Over the Counter: none History of alcohol / drug use?: Yes Longest period of sobriety (when/how long): unknown Negative Consequences of Use: Financial, Personal relationships Withdrawal Symptoms: Other (Comment) (anxiety   depression) Name of Substance 1: THC 1 - Age of First Use: 20 yrs old 1 - Amount (size/oz): one joint daily 1 - Frequency: daily 1 - Duration: since age 89 yrs 1 - Last Use / Amount: one blunt  Sleep: Good  Appetite:  Good  Current Medications: Current Facility-Administered Medications  Medication Dose Route Frequency Provider Last Rate Last Dose  . acetaminophen (TYLENOL) tablet 650 mg  650 mg Oral Q4H PRN Earney Navy, NP      . alum & mag hydroxide-simeth (MAALOX/MYLANTA) 200-200-20 MG/5ML suspension 30 mL  30 mL Oral PRN Earney Navy, NP      . ARIPiprazole (ABILIFY) tablet 10 mg  10 mg Oral Daily Thermon Leyland, NP   10 mg at 08/08/15 5784  . hydrOXYzine (ATARAX/VISTARIL) tablet 25 mg  25 mg Oral Q6H PRN Thermon Leyland, NP   25 mg at 08/08/15 0630  . ibuprofen (ADVIL,MOTRIN) tablet 600 mg  600 mg Oral Q8H PRN Earney Navy, NP      .  Influenza vac split quadrivalent PF (FLUARIX) injection 0.5 mL  0.5 mL Intramuscular Tomorrow-1000 Rockey SituFernando A Cobos, MD   0.5 mL at 08/07/15 1550  . LORazepam (ATIVAN) tablet 1 mg  1 mg Oral Q8H PRN Earney NavyJosephine C Onuoha, NP      . magnesium hydroxide (MILK OF MAGNESIA) suspension 30 mL  30 mL Oral Daily PRN Earney NavyJosephine C Onuoha, NP      . mirtazapine (REMERON) tablet 30 mg  30 mg Oral QHS Thermon LeylandLaura A Davis, NP   30 mg at 08/07/15 2143  . nicotine (NICODERM CQ - dosed in mg/24 hours) patch 21 mg  21 mg Transdermal Daily Earney NavyJosephine C Onuoha, NP   21 mg at 08/08/15 0823  . ondansetron (ZOFRAN) tablet 4 mg  4 mg Oral Q8H PRN Earney NavyJosephine C  Onuoha, NP      . zolpidem (AMBIEN) tablet 5 mg  5 mg Oral QHS PRN Earney NavyJosephine C Onuoha, NP        Lab Results:  Results for orders placed or performed during the hospital encounter of 08/05/15 (from the past 48 hour(s))  HIV antibody     Status: None   Collection Time: 08/07/15  6:46 AM  Result Value Ref Range   HIV Screen 4th Generation wRfx Non Reactive Non Reactive    Comment: (NOTE) Performed At: [facility] [address] [place], [place] [id number] Mila HomerHancock William F MD BJ:[id number]:[phone] Performed at [facility]   RPR     Status: None   Collection Time: 08/07/15  6:46 AM  Result Value Ref Range   RPR Ser Ql Non Reactive Non Reactive    Comment: (NOTE) Performed At: [facility] [address] [place], [place] [id number] Mila HomerHancock William F MD [phone]:[phone] Performed at [facility]     Physical Findings: AIMS: Facial and Oral Movements Muscles of Facial Expression: None, normal Lips and Perioral Area: None, normal Jaw: None, normal Tongue: None, normal,Extremity Movements Upper (arms, wrists, hands, fingers): None, normal Lower (legs, knees, ankles, toes): None, normal, Trunk Movements Neck, shoulders, hips: None, normal, Overall Severity Severity of abnormal movements (highest score from questions above): None, normal Incapacitation due to abnormal movements: None, normal Patient's awareness of abnormal movements (rate only patient's report): No Awareness, Dental Status Current problems with teeth and/or dentures?: No Does patient usually wear dentures?: No  CIWA:  CIWA-Ar Total: 1 COWS:  COWS Total Score: 2  Musculoskeletal: Strength & Muscle Tone: within normal limits Gait & Station: normal Patient leans: N/A  Psychiatric Specialty Exam: Review of Systems  Psychiatric/Behavioral: Positive for depression and hallucinations. The patient is nervous/anxious and has insomnia.   All other systems reviewed and are  negative.  denies headache, denies chest pain, denies shortness of breath, no rash   Blood pressure 112/70, pulse 78, temperature 98.1 F (36.7 C), temperature source Oral, resp. rate 16, height 5\' 11"  (1.803 m), weight 92.534 kg (204 lb), SpO2 100 %.Body mass index is 28.46 kg/(m^2).  General Appearance: Casual and Well Groomed  Eye Contact::  Good  Speech:  Clear and Coherent and Slow  Volume:  soft   Mood:  improved mood   Affect:  more reactive, smiles at times appropriately  Thought Process:  Coherent  Orientation:  Other:  fully alert and attentive   Thought Content:  WDL and Hallucinations, auditory, yet improving  Suicidal Thoughts:  No at this time denies any suicidal ideations, denies any self injurious ideations  Homicidal Thoughts:  No denies any violent  or homicidal ideations   Memory:  recent and remote grossly intact   Judgement:  Fair  Insight:  Fair  Psychomotor Activity:  Normal- no psychomotor agitation or restlessness   Concentration:  Good  Recall:  Good  Fund of Knowledge:Good  Language: Good  Akathisia:  No  Handed:  Right  AIMS (if indicated):     Assets:  Desire for Improvement Resilience Social Support  ADL's:  Intact  Cognition: WNL  Sleep:  Number of Hours: 6.25   MDD (major depressive disorder), recurrent, severe, with psychosis (HCC)  Today, on 08/08/2015, pt continues to improve in regard to a decrease in auditory hallucinations and depression. Will continue regimen below which appears to be effective thus far.   Treatment Plan Summary: Daily contact with patient to assess and evaluate symptoms and progress in treatment, Medication management, Plan inpatient admission and medications as below Continue to encourage milieu, group participation to work on coping skills and symptom reduction Continue Remeron 30 mgrs QHS for depression Continue Abilify 10 mgrs QDAY for psychotic symptoms Continue Vistaril 25 mgrs Q 6 hours  PRN for anxiety, as needed   Based on patient's reported concerns about STDs- see above-pending RPR/HIV.   Beau Fanny, FNP-BC 08/08/2015, 10:00 AM I agree with assessment and plan Madie Reno A. Dub Mikes, M.D.

## 2015-08-08 NOTE — Progress Notes (Signed)
DAR NOTE: Patient presents with anxious affect and depressed mood.  Denies pain, auditory and visual hallucinations. Pt endorses some confusion and ask same question over and over. As per self inventory, rates depression at 0, hopelessness at 0, and anxiety at 0.  Maintained on routine safety checks.  Medications given as prescribed.  Support and encouragement offered as needed. States goal for today is "to go home."  Patient observed socializing with peers in the dayroom.  Offered no complaint.

## 2015-08-08 NOTE — [facility] Group Notes (Signed)
[facility] Group Notes:  (Clinical Social Work)  08/08/2015  11:00AM-12:00PM  Summary of Progress/Problems:  The main focus of today's process group was to listen to a variety of genres of music and to identify that different types of music provoke different responses.  The patient then was able to identify personally what was soothing for them, as well as energizing.    The patient expressed understanding of how each type of music affected him and how this knowledge may be useful at home to change mood.  Prior to the group starting, he stated he was feeling sad, and afterward he said he still felt sad.  He did request a song and sang along with it, was there through the whole group unlike many others.  Type of Therapy:  Music Therapy   Participation Level:  Active  Participation Quality:  Attentive  Affect:  Blunted  Cognitive:  Oriented  Insight:  Improving  Engagement in Therapy:  Engaged  Modes of Intervention:   Activity, Exploration  Ambrose MantleMareida Grossman-Orr, LCSW 08/08/2015 12:44 PM

## 2015-08-08 NOTE — Progress Notes (Signed)
Adult Psychoeducational Group Note  Date:  08/08/2015 Time:  8:25 PM  Group Topic/Focus:  Wrap-Up Group:   The focus of this group is to help patients review their daily goal of treatment and discuss progress on daily workbooks.  Participation Level:  Active  Participation Quality:  Appropriate  Affect:  Appropriate  Cognitive:  Appropriate  Insight: Good  Engagement in Group:  Engaged  Modes of Intervention:  Discussion  Additional Comments:  Pt rated overall day a 10 out of 10 because he go to talk with his family. Pt reported that his goal for the day was "to start telling the truth", which patient says that he is working on completing.   Cleotilde NeerJasmine S Alisha Burgo 08/08/2015, 8:37 PM

## 2015-08-09 ENCOUNTER — Encounter (HOSPITAL_COMMUNITY): Payer: Self-pay | Admitting: Psychiatry

## 2015-08-09 DIAGNOSIS — F8081 Childhood onset fluency disorder: Secondary | ICD-10-CM | POA: Diagnosis present

## 2015-08-09 DIAGNOSIS — F1220 Cannabis dependence, uncomplicated: Secondary | ICD-10-CM | POA: Diagnosis present

## 2015-08-09 MED ORDER — MIRTAZAPINE 45 MG PO TABS
45.0000 mg | ORAL_TABLET | Freq: Every day | ORAL | Status: DC
  Administered 2015-08-09 – 2015-08-12 (×4): 45 mg via ORAL
  Filled 2015-08-09 (×2): qty 1
  Filled 2015-08-09: qty 3
  Filled 2015-08-09 (×4): qty 1

## 2015-08-09 MED ORDER — INFLUENZA VAC SPLIT QUAD 0.5 ML IM SUSY
0.5000 mL | PREFILLED_SYRINGE | INTRAMUSCULAR | Status: AC
  Administered 2015-08-10: 0.5 mL via INTRAMUSCULAR
  Filled 2015-08-09: qty 0.5

## 2015-08-09 MED ORDER — BENZTROPINE MESYLATE 0.5 MG PO TABS
0.5000 mg | ORAL_TABLET | Freq: Every day | ORAL | Status: DC
  Administered 2015-08-09 – 2015-08-12 (×4): 0.5 mg via ORAL
  Filled 2015-08-09 (×6): qty 1

## 2015-08-09 MED ORDER — ARIPIPRAZOLE 10 MG PO TABS
12.0000 mg | ORAL_TABLET | Freq: Every day | ORAL | Status: DC
  Administered 2015-08-10: 12 mg via ORAL
  Filled 2015-08-09 (×2): qty 1

## 2015-08-09 MED ORDER — ARIPIPRAZOLE 2 MG PO TABS
2.0000 mg | ORAL_TABLET | Freq: Every day | ORAL | Status: AC
  Administered 2015-08-09: 2 mg via ORAL
  Filled 2015-08-09: qty 1

## 2015-08-09 NOTE — Progress Notes (Signed)
Patient ID: Marvin Crawford, male   DOB: 1996/06/10, 20 y.o.   MRN: [id number]  DAR: Pt. Denies SI and A/V Hallucinations. He does report HI towards his mother but when Clinical research associatewriter inquired about this patient was unable to elaborate. Shortly after this he wanted to call his mother. He reports sleep was good, appetite is good, and concentration is good. He checks on his daily inventory sheet all the symptoms of withdrawal however patient does not have any outward signs of withdrawal from anything. Patient does not report any pain at this time. Support and encouragement provided to the patient. Scheduled medications administered to patient per physician's orders. Patient is receptive and cooperative. He is seen in the milieu and is appropriate interacting with staff. Q15 minute checks are maintained for safety.

## 2015-08-09 NOTE — Progress Notes (Signed)
Adult Psychoeducational Group Note  Date:  08/09/2015 Time:  8:55 PM  Group Topic/Focus:  Wrap-Up Group:   The focus of this group is to help patients review their daily goal of treatment and discuss progress on daily workbooks.  Participation Level:  Did Not Attend   Additional Comments:  Pt did not attend wrap up. RN notified.   Burman FreestoneCraddock, Donnajean Chesnut L 08/09/2015, 8:55 PM

## 2015-08-09 NOTE — Progress Notes (Signed)
Pt has been out in the milieu and pleasant and appropriate with staff and peers.  He denies SI/HI/AVH at this time.  He attended evening wrap-up group.  He had a visit from his mother which he says was good.  He hopes to discharge home soon.  He seems limited when in conversation with Clinical research associatewriter.  He makes his needs known to staff.  Support and encouragement offered.  Discharge plans are in process.  Safety maintained with q15 minute checks.

## 2015-08-09 NOTE — [facility] Group Notes (Signed)
[facility] LCSW Group Therapy  08/09/2015 2:27 PM   Type of Therapy:  Group Therapy  Participation Level:  Active  Participation Quality:  Attentive  Affect:  Appropriate  Cognitive:  Appropriate  Insight:  Improving  Engagement in Therapy:  Engaged  Modes of Intervention:  Clarification, Education, Exploration and Socialization  Summary of Progress/Problems: Today's group focused on relapse prevention.  We defined the term, and then brainstormed on ways to prevent relapse.  Sat quietly at the beginning.  When asked directly, stated he is a quiet person.  As time went by, and he heard others speak, he began contributing little snippets when there was a lull in the conversation.  "I graduated from [employer] in 2016."  Later, "I grew up without my father."  Later still, "I am dealing with depression."  And finally, "[place]  Declined to elaborate on any of the above statements.  Presents as someone might with an intellectual disability.  Marvin Crawford, Marvin Crawford 08/09/2015 , 2:27 PM

## 2015-08-09 NOTE — [facility] Suicide Risk Assessment (Addendum)
[facility] INPATIENT:  Family/Significant Other Suicide Prevention Education  Suicide Prevention Education:  Education Completed; Marvin Crawford, mother, [phone]  has been identified by the patient as the family member/significant other with whom the patient will be residing, and identified as the person(s) who will aid the patient in the event of a mental health crisis (suicidal ideations/suicide attempt).  With written consent from the patient, the family member/significant other has been provided the following suicide prevention education, prior to the and/or following the discharge of the patient.  The suicide prevention education provided includes the following:  Suicide risk factors  Suicide prevention and interventions  National Suicide Hotline telephone number  [facility] assessment telephone number  [facility] Emergency Assistance 911  [facility] and/or [facility]idential Mobile Crisis Unit telephone number  Request made of family/significant other to:  Remove weapons (e.g., guns, rifles, knives), all items previously/currently identified as safety concern.    Remove drugs/medications (over-the-counter, prescriptions, illicit drugs), all items previously/currently identified as a safety concern.  The family member/significant other verbalizes understanding of the suicide prevention education information provided.  The family member/significant other agrees to remove the items of safety concern listed above. Informed mother that patient had expressed HI towards her during his stay here, with plan of putting medication in her food or drink.  Marvin Crawford, Marvin Crawford B 08/09/2015, 2:56 PM

## 2015-08-09 NOTE — Progress Notes (Signed)
Patient ID: Marvin Crawford, male   DOB: 1995/09/06, 20 y.o.   MRN: [id number]  [facility]  08/09/2015  Marvin Crawford  MRN:  [id number] Subjective:   Patient reports "I feel depressed, mostly because I am away from family. My depression is a 10/10.'   Objective: Pt seen and chart reviewed. Pt is alert/oriented x4, calm, cooperative, and appropriate to situation.  Pt continues to endorse depressive sx, and rates it high. Pt has a speech difficulty , but is able to talk appropriately. Pt continues to endorse AH - states he hears people talking, but is unable to elaborate more. Pt denies any other concerns - reports sleep and appetite as improving. Per nursing pt - reports HI towards mother - which he did not report to Clinical research associatewriter. Pt continues to need encouragement and support.    Principal Problem: MDD (major depressive disorder), recurrent, severe, with psychosis (HCC) Diagnosis:   Patient Active Problem List   Diagnosis Date Noted  . Stuttering [F80.81] 08/09/2015  . Cannabis use disorder, moderate, dependence (HCC) [F12.20] 08/09/2015  . Alcohol abuse [F10.10] 02/17/2013  . MDD (major depressive disorder), recurrent, severe, with psychosis (HCC) [F33.3] 02/16/2013   Total Time spent with patient: 25 minutes    Past Medical History:  Past Medical History  Diagnosis Date  . Environmental allergies   . ADD (attention deficit disorder)   . Depression   . Anxiety    Family psychiatric History: denies hx of mental illness in family. Family medical hx: Mother has HTN.  Social History: Patient reports he lives with mother and brother and works for [employer]. History  Alcohol Use No     History  Drug Use  . Yes  . Special: Other-see comments    Comment: oil smoked in pipe calls "DAB", friends buy for him    Social History   Social History  . Marital Status: Single    Spouse Name: N/A  . Number of Children: N/A  . Years of Education: N/A   Social History Main Topics  .  Smoking status: Never Smoker   . Smokeless tobacco: None  . Alcohol Use: No  . Drug Use: Yes    Special: Other-see comments     Comment: oil smoked in pipe calls "DAB", friends buy for him  . Sexual Activity: Yes    Birth Control/ Protection: Condom   Other Topics Concern  . None   Social History Narrative   Additional Social History:    Pain Medications: none Prescriptions: abikify    remeron Over the Counter: none History of alcohol / drug use?: Yes Longest period of sobriety (when/how long): unknown Negative Consequences of Use: Financial, Personal relationships Withdrawal Symptoms: Other (Comment) (anxiety   depression) Name of Substance 1: THC 1 - Age of First Use: 20 yrs old 1 - Amount (size/oz): one joint daily 1 - Frequency: daily 1 - Duration: since age 20 yrs 1 - Last Use / Amount: one blunt  Sleep: Fair  Appetite:  Fair  Current Medications: Current Facility-Administered Medications  Medication Dose Route Frequency Provider Last Rate Last Dose  . acetaminophen (TYLENOL) tablet 650 mg  650 mg Oral Q4H PRN Earney NavyJosephine C Onuoha, NP      . alum & mag hydroxide-simeth (MAALOX/MYLANTA) 200-200-20 MG/5ML suspension 30 mL  30 mL Oral PRN Earney NavyJosephine C Onuoha, NP      . Melene Muller[START ON 08/10/2015] ARIPiprazole (ABILIFY) tablet 12 mg  12 mg Oral QHS Jomarie LongsSaramma Giani Winther, MD      .  ARIPiprazole (ABILIFY) tablet 2 mg  2 mg Oral QHS Fernando A Cobos, MD      . benztropine (COGENTIN) tablet 0.5 mg  0.5 mg Oral QHS Kinnedy Mongiello, MD      . hydrOXYzine (ATARAX/VISTARIL) tablet 25 mg  25 mg Oral Q6H PRN Thermon Leyland, NP   25 mg at 08/08/15 1806  . ibuprofen (ADVIL,MOTRIN) tablet 600 mg  600 mg Oral Q8H PRN Earney Navy, NP      . Influenza vac split quadrivalent PF (FLUARIX) injection 0.5 mL  0.5 mL Intramuscular Tomorrow-1000 Rockey Situ Cobos, MD   0.5 mL at 08/07/15 1550  . LORazepam (ATIVAN) tablet 1 mg  1 mg Oral Q8H PRN Earney Navy, NP      . magnesium hydroxide (MILK OF  MAGNESIA) suspension 30 mL  30 mL Oral Daily PRN Earney Navy, NP      . mirtazapine (REMERON) tablet 45 mg  45 mg Oral QHS Carol Theys, MD      . nicotine (NICODERM CQ - dosed in mg/24 hours) patch 21 mg  21 mg Transdermal Daily Earney Navy, NP   21 mg at 08/09/15 0746  . ondansetron (ZOFRAN) tablet 4 mg  4 mg Oral Q8H PRN Earney Navy, NP   4 mg at 08/08/15 1422  . zolpidem (AMBIEN) tablet 5 mg  5 mg Oral QHS PRN Earney Navy, NP        Lab Results:  No results found for this or any previous visit (from the past 48 hour(s)).  Physical Findings: AIMS: Facial and Oral Movements Muscles of Facial Expression: None, normal Lips and Perioral Area: None, normal Jaw: None, normal Tongue: None, normal,Extremity Movements Upper (arms, wrists, hands, fingers): None, normal Lower (legs, knees, ankles, toes): None, normal, Trunk Movements Neck, shoulders, hips: None, normal, Overall Severity Severity of abnormal movements (highest score from questions above): None, normal Incapacitation due to abnormal movements: None, normal Patient's awareness of abnormal movements (rate only patient's report): No Awareness, Dental Status Current problems with teeth and/or dentures?: No Does patient usually wear dentures?: No  CIWA:  CIWA-Ar Total: 1 COWS:  COWS Total Score: 2  Musculoskeletal: Strength & Muscle Tone: within normal limits Gait & Station: normal Patient leans: N/A  Psychiatric Specialty Exam: Review of Systems  Psychiatric/Behavioral: Positive for depression and hallucinations. The patient is nervous/anxious.   All other systems reviewed and are negative.  denies headache, denies chest pain, denies shortness of breath, no rash   Blood pressure 127/69, pulse 89, temperature 97.3 F (36.3 C), temperature source Oral, resp. rate 20, height 5\' 11"  (1.803 m), weight 92.534 kg (204 lb), SpO2 100 %.Body mass index is 28.46 kg/(m^2).  General Appearance: Casual and  Well Groomed  Eye Contact::  Good  Speech:  Slow and Has stuttering  Volume:  soft   Mood:  Depressed and Dysphoric  Affect:  Depressed  Thought Process:  Coherent  Orientation:  Full (Time, Place, and Person)  Thought Content:  Hallucinations: Auditory and Rumination  Suicidal Thoughts:  No at this time denies any suicidal ideations, denies any self injurious ideations  Homicidal Thoughts:  No denies any violent or homicidal ideations - however reported to nursing that he was having HI.  Memory:  recent and remote grossly intact , IMMEDIATE - FAIR  Judgement:  Fair  Insight:  Fair  Psychomotor Activity:  Normal- no psychomotor agitation or restlessness   Concentration:  Good  Recall:  Good  Fund  of Knowledge:Good  Language: Good  Akathisia:  No  Handed:  Right  AIMS (if indicated):     Assets:  Desire for Improvement Resilience Social Support  ADL's:  Intact  Cognition: WNL  Sleep:  Number of Hours: 6.75     Treatment Plan Summary:Patient today rates his depression at a 10/10 - and also reported to nursing that he was having HI towards mother. Will readjust medications and continue treatment. Daily contact with patient to assess and evaluate symptoms and progress in treatment, Medication management, Plan inpatient admission and medications as below Continue to encourage milieu, group participation to work on coping skills and symptom reduction Increase Remeron to 45 mgrs PO QHS for depression Increase Abilify to 12mg rs PO QHS for psychotic symptoms Continue Vistaril 25 mgrs Q 6 hours  PRN for anxiety, as needed  Based on patient's reported concerns about STDs-  RPR/HIV- NON REACTIVE ( 08/09/15)  Daaron Dimarco, md 08/09/2015, 2;15 pm

## 2015-08-09 NOTE — Progress Notes (Signed)
Recreation Therapy Notes  01.02.2017 @ approximately LRT met with patient to investigate ways to enhance tx. Patient has previously been a patient of LRT when he was a patient on the C/A unit, approximately 2014. Patient stated he remembered LRT from previous admission and he was happy to work with her again. Patient explained that he called the cops to say he had killed someone, but he had not really hurt anyone. Patient stated his depression made him do that. Patient shared he has a job at [employer], but they will not allow him to have his phone so he can listen to music at work. 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 Patient additionally shared that he uses gardening as a coping skill as well. LRT inquired about patient use of coping skills and he stated that he stopped using them when he graduated high school. Patient was not able to articulate why he stopped at this time. Patient open to working with LRT on investigating coping skills and developing plan for use post d/c.   Laureen Ochs Raaga Maeder, LRT/CTRS   Brenner Visconti L 08/09/2015 3:00 PM

## 2015-08-09 NOTE — Plan of Care (Signed)
Problem: Diagnosis: Increased Risk For Suicide Attempt Goal: STG-Patient Will Comply With Medication Regime Outcome: Progressing Marvin Crawford is compliant with medications at this time.

## 2015-08-09 NOTE — [facility] Group Notes (Signed)
[facility]   08/09/2015 11:15 AM  Participation Quality:  Was in group initially.  Came back in, went directly to phone and dialed even though asked multiple times to wait until group was over.  Called his mother and told her to pick him up.  Left and did not return.    Daryel GeraldNorth, Takisha Pelle B

## 2015-08-10 DIAGNOSIS — R4183 Borderline intellectual functioning: Secondary | ICD-10-CM

## 2015-08-10 MED ORDER — TRAZODONE HCL 50 MG PO TABS: 50.0000 mg | ORAL_TABLET | Freq: Every evening | ORAL | Status: DC | PRN

## 2015-08-10 NOTE — Progress Notes (Signed)
DAR Note: Marvin Crawford has been up and visible on the unit.  Minimal interaction with peers.  He is very restless but pleasant and cooperative.  He denies any SI/HI or A/V hallucinations.  He attends groups.  Flu vaccine given in left deltoid but he was anxious about getting vaccine.  He was able to take the shot with assistance from other staff.  He tolerated well.  He completed his self inventory and reports that his depression and hopelessness are 0/10 and his anxiety is 10/10.  He states that his goal for today is "to go home."  He states that he his looking forward to leaving because he is planning on returning to work at [employer] and [facility] [name] to get GED/Diploma.  He denies any pain at this time and appears to be in no physical distress.  Encouraged participation in group and unit activities.  Q 15 minute checks maintained for safety.  We will continue to monitor the progress towards his goals.

## 2015-08-10 NOTE — Progress Notes (Signed)
Tonight, pt seems a little more restless.  He went to group tonight, but was in and out of the dayroom several times.  He did not participate in the group.  He denies SI/HI/AVH.  Pt has been acting a little more childlike this evening.  He came to the nurse's station stating that he was bored, but when staff suggested things for pt to do, he did not want to do any of them.  Discharge plans are in process.  The plan is for him to return home.  Support and encouragement offered.  Pt is pleasant and cooperative.  Safety maintained with q15 minute checks.

## 2015-08-10 NOTE — [facility] Group Notes (Signed)
[facility] Group Notes:  (Nursing/MHT/Case Management/Adjunct)  Date:  08/10/2015  Time:  10:55 AM  Type of Therapy:  Nurse Education  Participation Level:  Active  Participation Quality:  Appropriate and Sharing  Affect:  Flat  Cognitive:  Alert and Appropriate  Insight:  Limited  Engagement in Group:  Limited and Supportive  Modes of Intervention:  Discussion and Education  Summary of Progress/Problems:  Group topic today was Recovery and goal setting.  [name] was able to state that he wants to try to drink more milk as a goal for today.  He was able to answer questions appropriately.    Norm ParcelHeather V Bryndan Bilyk 08/10/2015, 10:55 AM

## 2015-08-10 NOTE — Progress Notes (Signed)
  [facility] :  Will you be returning to the same living situation after discharge:  Yes,  home At discharge, do you have transportation home?: Yes,  mother Do you have the ability to pay for your medications: Yes,  MCD  Release of information consent forms completed and in the chart;  Patient's signature needed at discharge.  Patient to Follow up at: Follow-up Information    Follow up with [facility] . Go on 08/16/2015.   Why:  You have an appointment on January 9th, 2017 at 1:00pm. You will be given an appointment for medication management when you for this appointment.   Contact information:   [facility]  [address] Judie PetitM, [place], [place] [address] [phone]      Next level of care provider has [facility] Link:no  Safety Planning and Suicide Prevention discussed: Yes,  yes  Have you used any form of tobacco in the last 30 days? (Cigarettes, Smokeless Tobacco, Cigars, and/or Pipes): No  Has patient been referred to the Quitline?: N/A patient is not a smoker  Patient has been referred for addiction treatment: Yes  Ida Rogueorth, Mukesh Kornegay B 08/10/2015, 5:21 PM

## 2015-08-10 NOTE — [facility] Group Notes (Signed)
[facility] LCSW Group Therapy  08/10/2015 3:42 PM  Type of Therapy:  Group Therapy  Participation Level:  Minimal  Participation Quality:  Appropriate and Sharing  Affect:  Patient stated he did not feel well, however, decided to stay in group and participate  Cognitive:  Disorganized and Confused  Insight:  Lacking and Off Topic  Engagement in Therapy:  Developing/Improving  Modes of Intervention:  Discussion, Education, Exploration and Support  Summary of Progress/Problems: Today's group focused on the term community. Participants were asked to define the term, and then identify positive aspects of a community. Marvin Crawford participated in group on today, however informed CSW that he did not feel well. Marvin Crawford was quiet throughout the group. Patient appeared to lack insight of topic being discussed. Patient began to ask questions regarding "How he could get saved, and give his life back to God". CSW was able to redirect patient's thoughts to group topic. Patient stated "he views a community as friends and family".   [name] Comp 08/10/2015, 3:42 PM

## 2015-08-10 NOTE — Progress Notes (Signed)
Recreation Therapy Notes  Animal-Assisted Activity (AAA) Program Checklist/Progress Notes Patient Eligibility Criteria Checklist & Daily Group note for Rec Tx Intervention  Date: 01.03.2017 Time: 2:45pm [address]    AAA/T Program Assumption of Risk Form signed by Patient/ or Parent Legal Guardian yes  Patient is free of allergies or sever asthma yes  Patient reports no fear of animals yes  Patient reports no history of cruelty to animals yes  Patient understands his/her participation is voluntary yes  Patient washes hands before animal contact yes  Patient washes hands after animal contact yes  Behavioral Response: Appropriate   Education: Hand Washing, Appropriate Animal Interaction   Education Outcome: Acknowledges education.   Clinical Observations/Feedback: Patient engaged appropriately with therapy dog, petting him appropriately and appearing to enjoy time spent with dog. Patient interacted appropriately with peers during session. Patient tolerated session for approximately 10 minutes, at which time he returned to [address].   Marykay Lexenise L Aryanah Enslow, LRT/CTRS  Lular Letson L 08/10/2015 2:12 PM

## 2015-08-10 NOTE — Tx Team (Signed)
Interdisciplinary Treatment Plan Update (Adult) Date: 08/10/2015   Date: 08/10/2015 3:17 PM  Progress in Treatment:  Attending groups: Yes Participating in groups: Yes Taking medication as prescribed: Yes  Tolerating medication: Yes  Family/Significant othe contact made: Yes Patient understands diagnosis: No  Limited insight Discussing patient identified problems/goals with staff: Yes  Medical problems stabilized or resolved: Yes  Denies suicidal/homicidal ideation: Yes Patient has not harmed self or Others: Yes   New problem(s) identified: None identified at this time.   Discharge Plan or Barriers: Additional comments:  Patient and CSW reviewed pt's identified goals and treatment plan. Patient verbalized understanding and agreed to treatment plan. CSW reviewed [facility] "Discharge Process and Patient Involvement" Form. Pt verbalized understanding of information provided and signed form.   Reason for Continuation of Hospitalization:    Estimated length of stay: Likely d/c tomorrow  Review of initial/current patient goals per problem list:   1.  Goal(s): Patient will participate in aftercare plan  Met: Yes  Target date: 3-5 days from date of admission   As evidenced by: Patient will participate within aftercare plan AEB aftercare provider and housing plan at discharge being identified.  08/06/15: CSW to work with Pt to assess for appropriate discharge plan and faciliate appointments and referrals as needed prior to d/c.    08/10/15:  Return home with mother, follow up [facility].  Goal (s): Patient will exhibit decreased depressive symptoms and suicidal ideations.  Met:  Yes  Target date: 3-5 days from date of admission   As evidenced by: Patient will utilize self rating of depression at 3 or below and demonstrate decreased signs of depression or be deemed stable for discharge by MD.  08/06/15: Pt rates depression at 0/10; denies SI at this time  5.  Goal(s): Patient will  demonstrate decreased signs of psychosis  . Met:  Yes . Target date: 3-5 days from date of admission  . As evidenced by: Patient will demonstrate decreased frequency of AVH or return to baseline function    -08/06/15: Pt continues to present with bizarre affect and disorganized    Thoughts.    08/10/15:  No signs nor symptoms of psychosis today.   Attendees:  Patient:    Family:    Physician: Ursula Alert  08/10/2015 3:17 PM  Nursing:   08/10/2015 3:17 PM  Clinical Social Worker Rod [employer]  08/10/2015 3:17 PM  Other: Erasmo Downer Drinkard, [place] 08/10/2015 3:17 PM  Clinical: Manuella Ghazi  08/10/2015 3:17 PM  Other: , RN Charge Nurse 08/10/2015 3:17 PM  Other: Hilda Lias, [place]

## 2015-08-10 NOTE — Progress Notes (Signed)
Patient ID: Marvin Crawford, male   DOB: Jun 30, 1996, 20 y.o.   MRN: [id number]  [facility] MD Progress Note  08/10/2015  Marvin Crawford  MRN:  [id number] Subjective:   Patient reports "I am a bit better today ."    Objective: Pt seen and chart reviewed. Pt is alert/oriented x4, calm, cooperative, and appropriate to situation. Pt presented after he was delusional at home .Patient had called 911 asking to be taken to jail after being mean to his cousin. Pt here continues to be bizarre , childlike , has a hx of learning disability as well as stuttering. Pt is also limited intellectually , likely borderline intellectual function- his IQ is not documented. Pt continues to appear as flat and depressed , continues to need reassurance throughout the day. Pt also reports on and off HI towards his mother with whom he lives - pt reports he had come up with a plan to grind all his medications together and mix it in her drink and give it to her.Pt reports he still has these thoughts on and off. Pt per staff - is seen as restless on the unit . Pt also had sleep issues last night . Pt is currently tolerating his medications well. His dosage was increased last night. Will continue to observe on the unit.     Principal Problem: MDD (major depressive disorder), recurrent, severe, with psychosis (HCC) Diagnosis:   Patient Active Problem List   Diagnosis Date Noted  . 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 Stuttering [F80.81] 08/09/2015  . Cannabis use disorder, moderate, dependence (HCC) [F12.20] 08/09/2015  . Alcohol abuse [F10.10] 02/17/2013  . MDD (major depressive disorder), recurrent, severe, with psychosis (HCC) [F33.3] 02/16/2013   Total Time spent with patient: 25 minutes    Past Medical History:  Past Medical History  Diagnosis Date  . Environmental allergies   . ADD (attention deficit disorder)   . Depression   . Anxiety    Family psychiatric History: denies hx of mental  illness in family. Family medical hx: Mother has HTN.  Social History: Patient reports he lives with mother and brother and works for [employer]. History  Alcohol Use No     History  Drug Use  . Yes  . Special: Other-see comments    Comment: oil smoked in pipe calls "DAB", friends buy for him    Social History   Social History  . Marital Status: Single    Spouse Name: N/A  . Number of Children: N/A  . Years of Education: N/A   Social History Main Topics  . Smoking status: Never Smoker   . Smokeless tobacco: None  . Alcohol Use: No  . Drug Use: Yes    Special: Other-see comments     Comment: oil smoked in pipe calls "DAB", friends buy for him  . Sexual Activity: Yes    Birth Control/ Protection: Condom   Other Topics Concern  . None   Social History Narrative   Additional Social History:    Pain Medications: none Prescriptions: abikify    remeron Over the Counter: none History of alcohol / drug use?: Yes Longest period of sobriety (when/how long): unknown Negative Consequences of Use: Financial, Personal relationships Withdrawal Symptoms: Other (Comment) (anxiety   depression) Name of Substance 1: THC 1 - Age of First Use: 20 yrs old 1 - Amount (size/oz): one joint daily 1 - Frequency: daily 1 - Duration: since age 43 yrs 1 - Last Use / Amount: one  blunt  Sleep: Poor  Appetite:  Fair  Current Medications: Current Facility-Administered Medications  Medication Dose Route Frequency Provider Last Rate Last Dose  . acetaminophen (TYLENOL) tablet 650 mg  650 mg Oral Q4H PRN Earney NavyJosephine C Onuoha, NP      . alum & mag hydroxide-simeth (MAALOX/MYLANTA) 200-200-20 MG/5ML suspension 30 mL  30 mL Oral PRN Earney NavyJosephine C Onuoha, NP      . ARIPiprazole (ABILIFY) tablet 12 mg  12 mg Oral QHS Fern Asmar, MD      . benztropine (COGENTIN) tablet 0.5 mg  0.5 mg Oral QHS Shristi Scheib, MD   0.5 mg at 08/09/15 2115  . hydrOXYzine (ATARAX/VISTARIL) tablet 25 mg  25 mg Oral Q6H PRN  Thermon LeylandLaura A Davis, NP   25 mg at 08/08/15 1806  . ibuprofen (ADVIL,MOTRIN) tablet 600 mg  600 mg Oral Q8H PRN Earney NavyJosephine C Onuoha, NP      . LORazepam (ATIVAN) tablet 1 mg  1 mg Oral Q8H PRN Earney NavyJosephine C Onuoha, NP      . magnesium hydroxide (MILK OF MAGNESIA) suspension 30 mL  30 mL Oral Daily PRN Earney NavyJosephine C Onuoha, NP      . mirtazapine (REMERON) tablet 45 mg  45 mg Oral QHS Nikol Lemar, MD   45 mg at 08/09/15 2115  . nicotine (NICODERM CQ - dosed in mg/24 hours) patch 21 mg  21 mg Transdermal Daily Earney NavyJosephine C Onuoha, NP   21 mg at 08/10/15 0808  . ondansetron (ZOFRAN) tablet 4 mg  4 mg Oral Q8H PRN Earney NavyJosephine C Onuoha, NP   4 mg at 08/08/15 1422  . traZODone (DESYREL) tablet 50 mg  50 mg Oral QHS PRN Jomarie LongsSaramma Augustino Savastano, MD        Lab Results:  No results found for this or any previous visit (from the past 48 hour(s)).  Physical Findings: AIMS: Facial and Oral Movements Muscles of Facial Expression: None, normal Lips and Perioral Area: None, normal Jaw: None, normal Tongue: None, normal,Extremity Movements Upper (arms, wrists, hands, fingers): None, normal Lower (legs, knees, ankles, toes): None, normal, Trunk Movements Neck, shoulders, hips: None, normal, Overall Severity Severity of abnormal movements (highest score from questions above): None, normal Incapacitation due to abnormal movements: None, normal Patient's awareness of abnormal movements (rate only patient's report): No Awareness, Dental Status Current problems with teeth and/or dentures?: No Does patient usually wear dentures?: No  CIWA:  CIWA-Ar Total: 1 COWS:  COWS Total Score: 2  Musculoskeletal: Strength & Muscle Tone: within normal limits Gait & Station: normal Patient leans: N/A  Psychiatric Specialty Exam: Review of Systems  Psychiatric/Behavioral: Positive for depression and hallucinations. The patient is nervous/anxious.   All other systems reviewed and are negative.  denies headache, denies chest pain, denies  shortness of breath, no rash   Blood pressure 114/68, pulse 81, temperature 97.5 F (36.4 C), temperature source Oral, resp. rate 16, height 5\' 11"  (1.803 m), weight 92.534 kg (204 lb), SpO2 100 %.Body mass index is 28.46 kg/(m^2).  General Appearance: Casual and Well Groomed  Eye Contact::  Good  Speech:  Slow and Has stuttering  Volume:  soft   Mood:  Depressed and Dysphoric  Affect:  Depressed with some improvement  Thought Process:  Coherent  Orientation:  Full (Time, Place, and Person)  Thought Content:  Hallucinations: Auditory and Rumination improving  Suicidal Thoughts:  No at this time denies any suicidal ideations, denies any self injurious ideations  Homicidal Thoughts:  Yes.  with intent/plan reports  HI on and off towards mother with whom he lives   Memory:  recent and remote grossly intact , IMMEDIATE - FAIR  Judgement:  Fair  Insight:  Fair  Psychomotor Activity:  Normal- no psychomotor agitation or restlessness   Concentration:  Good  Recall:  Good  Fund of Knowledge:Good  Language: Good  Akathisia:  No  Handed:  Right  AIMS (if indicated):     Assets:  Desire for Improvement Resilience Social Support  ADL's:  Intact  Cognition: WNL  Sleep:  Number of Hours: 6.75     Treatment Plan Summary:Patient today continues to appear depressed and has a depressed affect as well as has on and off HI with plan to hurt his mother. Pt with limited intellectual function , will continue treatment.    rDaily contact with patient to assess and evaluate symptoms and progress in treatment, Medication management, Plan inpatient admission and medications as below Continue to encourage milieu, group participation to work on coping skills and symptom reduction Increased Remeron to 45 mgrs PO QHS for depression Start Trazodone 50 mg po qhs prn for sleep.Discontinue Ambien. Increase Abilify to 12mg rs PO QHS for psychotic symptoms Continue Vistaril 25 mgrs Q 6 hours  PRN for anxiety, as  needed  Based on patient's reported concerns about STDs-  RPR/HIV- NON REACTIVE ( 08/09/15)  Nikkia Devoss, md 08/10/2015, 12:11 PM

## 2015-08-10 NOTE — Progress Notes (Addendum)
Marvin Crawford has come to the nursing staff to inform us that he wants to be called "Marvin Crawford" and will not wear his hospital bracelet.  Encouraged him to wear the bracelet with his birth name and we will work on calling him by the name that he choses.  He continues to decline wearing the bracelet.  He continued to ask multiple times for new bracelet.  Encouraged him to call Mom to bring in correct paperwork and we will gladly change his name.  Will pass this information off on report.

## 2015-08-10 NOTE — Progress Notes (Signed)
Recreation Therapy Notes  01.03.2017 @ approximately 3:50pm. LRT met with patient to investigate coping skills patient can use post d/c. Patient demonstrated difficulty identifying emotions requiring coping skills and how those emotions make his body react. Due to difficulty patient was only asked to identify two emotions. Patient identified sad and angry. Patient was not able to identify specific physical reactions to emotions, rather he was able to identify how the emotion makes him feel. For example patient stated that angry makes him feel like no one is listening to him. LRT attempted to have patient identify physical reaction, however patient ultimately unable to do. Patient was able to identify music and walking as coping skills for identified emotions. LRT will continue work during admission.   Laureen Ochs Kamen Hanken, LRT/CTRS  Maxine Huynh L 08/10/2015 4:07 PM

## 2015-08-11 MED ORDER — LORAZEPAM 2 MG/ML IJ SOLN
INTRAMUSCULAR | Status: AC
  Filled 2015-08-11: qty 1

## 2015-08-11 MED ORDER — OLANZAPINE 10 MG IM SOLR
INTRAMUSCULAR | Status: AC
  Filled 2015-08-11: qty 10

## 2015-08-11 MED ORDER — FAMOTIDINE 20 MG PO TABS
20.0000 mg | ORAL_TABLET | Freq: Two times a day (BID) | ORAL | Status: DC
  Administered 2015-08-12 – 2015-08-13 (×3): 20 mg via ORAL
  Filled 2015-08-11 (×8): qty 1

## 2015-08-11 MED ORDER — ARIPIPRAZOLE 15 MG PO TABS
15.0000 mg | ORAL_TABLET | Freq: Every day | ORAL | Status: DC
  Administered 2015-08-11 – 2015-08-12 (×2): 15 mg via ORAL
  Filled 2015-08-11 (×4): qty 1

## 2015-08-11 MED ORDER — LORAZEPAM 2 MG/ML IJ SOLN: 1.0000 mg | Freq: Two times a day (BID) | INTRAMUSCULAR | Status: DC | PRN

## 2015-08-11 MED ORDER — OLANZAPINE 10 MG PO TBDP
10.0000 mg | ORAL_TABLET | Freq: Two times a day (BID) | ORAL | Status: DC | PRN
  Administered 2015-08-12: 10 mg via ORAL
  Filled 2015-08-11 (×2): qty 1

## 2015-08-11 MED ORDER — LORAZEPAM 1 MG PO TABS
1.0000 mg | ORAL_TABLET | Freq: Two times a day (BID) | ORAL | Status: DC | PRN
  Administered 2015-08-11: 1 mg via ORAL
  Filled 2015-08-11: qty 1

## 2015-08-11 MED ORDER — OLANZAPINE 10 MG IM SOLR: 10.0000 mg | Freq: Two times a day (BID) | INTRAMUSCULAR | Status: DC | PRN

## 2015-08-11 NOTE — [facility] Group Notes (Signed)
[facility]   08/11/2015 10:41 AM  Participation Quality:  Minimal  Mood/Affect:  Flat  Depression Rating:    Anxiety Rating:    Thoughts of Suicide:  No Will you contract for safety?   NA  Current AVH:  No  Plan for Discharge/Comments:  Although pt had told me before group that his name was "Marvin Crawford" and he needed to be sent to the ED "because I don't know you and you don't know me,"  [facility] group [name] and claimed that as his name.  Stated that his sleep is good, his appetite is good and he does not need a note for work when he leaves.  Transportation Means:   Supports:  Marvin Crawford, Marvin Crawford

## 2015-08-11 NOTE — Progress Notes (Signed)
1:1 note:   D: Pt is calm and cooperative at this time.  He describes his mood as "good."  He denies SI and HI.  He reported he had a good visit with his mother and aunt.  Pt denies hallucinations, denies pain.  He is visible in the milieu at this time.    A: Pt is on 1:1 monitoring for safety.  Met with pt and provided support and encouragement.  Actively listened to pt.    R: Pt denies needs and concerns at this time.  He reports that he will notify staff if he has thoughts of hurting himself or others prior to acting on these thoughts.  Pt is safe on the unit.  Will continue to monitor and assess.   

## 2015-08-11 NOTE — Progress Notes (Signed)
Nursing Note: [id number]  D:  Pt guarded today, told this Clinical research associatewriter that his name was Jess BartersXavier this morning and was requesting a new armband. Later, returned to [place] and when asked who Jess BartersXavier was, her reported, "I don't know."  Pt asking to go the ER intially to have his name changed on ID band and then later due to "stomach ache." Pepcid ordered, pt would not take for this RN despite many gentle requests and explanations.  Pt called EMS from phone. Pt told that there is medical staff here who can help with this problem.  1600:  Pt called 911 for help, this RN asked pt why he is calling?  "I am sad, I need to go to jail because I need to tell the Policeman what was done to me and what I did to someone."  Five minutes later, the pt took off his shirt and wrapped around throat like a noose and pulled up tightly, it took 4 staff members to pull off.  Pt was not harmed.  Immediately initiated 1:1 for suicide risk/safety. Mother notified.  A: Assessed abdomen:  Abdomen is soft with hypoactive bowel sounds, tenderness noted upon palpating lower abd. Pt verbalizes that he had a BM this morning which was formed.  This RN asked pt to save vomitus if this occurs, he stated that he did vomit, but did not save in toilet.  He later asked to eat something and    Continued close monitoring. Encouraged to verbalize needs and concerns, active listening and support provided.  Pt given Lorazapam for anxiety with slight improvement.  Continued Q 15 minute safety checks.    R:  Pt. Has 1:1 sitter for safety

## 2015-08-11 NOTE — Progress Notes (Addendum)
1:1 note:  D: Pt has been calm and cooperative this evening.  He is currently resting in his room with his eyes closed.  Respirations are even and unlabored.  Pt does not appear to be in any distress.    A: Medication administered per order.  Pt remains on 1:1 monitoring.    R: Pt is safe on the unit.  1:1 staff is monitoring pt per order.  He was compliant with scheduled medication earlier tonight.  Will continue to monitor and assess.

## 2015-08-11 NOTE — Plan of Care (Signed)
Problem: Alteration in mood & ability to function due to Goal: LTG-Pt reports reduction in suicidal thoughts (Patient reports reduction in suicidal thoughts and is able to verbalize a safety plan for whenever patient is feeling suicidal)  Outcome: Progressing Pt denies SI and verbally contracts for safety.       

## 2015-08-11 NOTE — Progress Notes (Signed)
Adult Psychoeducational Group Note  Date:  08/11/2015 Time:  10:16 PM  Group Topic/Focus:  Wrap-Up Group:   The focus of this group is to help patients review their daily goal of treatment and discuss progress on daily workbooks.  Participation Level:  Active  Participation Quality:  Appropriate  Affect:  Appropriate  Cognitive:  Appropriate  Insight: Good  Engagement in Group:  Engaged  Modes of Intervention:  Activity  Additional Comments:  Patient rated his day an 8. Goal is to get better.  Claria DiceKiara M Brihanna Devenport 08/11/2015, 10:16 PM

## 2015-08-11 NOTE — Progress Notes (Addendum)
Patient ID: NAYEF COLLEGE, male   DOB: 1996/03/27, 20 y.o.   MRN: [id number]  [facility] MD Progress Note  08/11/2015  CARVIN ALMAS  MRN:  [id number] Subjective:   Patient reports "I am [place] . I felt my name was "xavier" before , since the tech here told me it was. But now I do not think it is. "    Objective: Pt seen and chart reviewed. Pt is alert/oriented x4, calm, cooperative, and appropriate to situation. Pt this AM had presented to other staff on the unit with a new name and was delusional that he was not [place]. Pt  Pt was irritable and wanted to be evaluated as a new pt according to staff. However , when Clinical research associate evaluated him later on , he reported his name as Zackariah and reported he just made an error in thinking. Pt denied all other concerns and reported doing well.  As per notes in EHR - pt attended groups and was introducing himself as Jess Barters " to other patients. Pt later on was seen as making 911 calls - calling EMS to take him to the ED and had to be redirected. Pt reported abdominal pain and was examined by Clinical research associate. Pt on exam : reported tenderness - lower abdominal , denied any fever, chills, nausea diarrhea or other associated sx. Pt was given symptomatic rx for abdominal pain and was advised to let staff know if it worsens.  Pt continues to be delusional, intrusive and needs redirection and support.    Principal Problem: MDD (major depressive disorder), recurrent, severe, with psychosis (HCC) Diagnosis:   Patient Active Problem List   Diagnosis Date Noted  . 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 Stuttering [F80.81] 08/09/2015  . Cannabis use disorder, moderate, dependence (HCC) [F12.20] 08/09/2015  . Alcohol abuse [F10.10] 02/17/2013  . MDD (major depressive disorder), recurrent, severe, with psychosis (HCC) [F33.3] 02/16/2013   Total Time spent with patient: 25 minutes    Past Medical History:  Past Medical History  Diagnosis Date  .  Environmental allergies   . ADD (attention deficit disorder)   . Depression   . Anxiety    Family psychiatric History: denies hx of mental illness in family. Family medical hx: Mother has HTN.  Social History: Patient reports he lives with mother and brother and works for [employer]. History  Alcohol Use No     History  Drug Use  . Yes  . Special: Other-see comments    Comment: oil smoked in pipe calls "DAB", friends buy for him    Social History   Social History  . Marital Status: Single    Spouse Name: N/A  . Number of Children: N/A  . Years of Education: N/A   Social History Main Topics  . Smoking status: Never Smoker   . Smokeless tobacco: None  . Alcohol Use: No  . Drug Use: Yes    Special: Other-see comments     Comment: oil smoked in pipe calls "DAB", friends buy for him  . Sexual Activity: Yes    Birth Control/ Protection: Condom   Other Topics Concern  . None   Social History Narrative   Additional Social History:    Pain Medications: none Prescriptions: abikify    remeron Over the Counter: none History of alcohol / drug use?: Yes Longest period of sobriety (when/how long): unknown Negative Consequences of Use: Financial, Personal relationships Withdrawal Symptoms: Other (Comment) (anxiety   depression) Name of Substance 1: THC 1 -  Age of First Use: 20 yrs old 1 - Amount (size/oz): one joint daily 1 - Frequency: daily 1 - Duration: since age 20 yrs 1 - Last Use / Amount: one blunt  Sleep: Poor improving  Appetite:  Fair  Current Medications: Current Facility-Administered Medications  Medication Dose Route Frequency Provider Last Rate Last Dose  . acetaminophen (TYLENOL) tablet 650 mg  650 mg Oral Q4H PRN Earney NavyJosephine C Onuoha, NP      . alum & mag hydroxide-simeth (MAALOX/MYLANTA) 200-200-20 MG/5ML suspension 30 mL  30 mL Oral PRN Earney NavyJosephine C Onuoha, NP      . ARIPiprazole (ABILIFY) tablet 12 mg  12 mg Oral QHS Jomarie LongsSaramma Ryzen Deady, MD   12 mg at 08/10/15  2101  . benztropine (COGENTIN) tablet 0.5 mg  0.5 mg Oral QHS Jomarie LongsSaramma Dyon Rotert, MD   0.5 mg at 08/10/15 2101  . famotidine (PEPCID) tablet 20 mg  20 mg Oral BID Jomarie LongsSaramma Islay Polanco, MD      . hydrOXYzine (ATARAX/VISTARIL) tablet 25 mg  25 mg Oral Q6H PRN Thermon LeylandLaura A Davis, NP   25 mg at 08/08/15 1806  . ibuprofen (ADVIL,MOTRIN) tablet 600 mg  600 mg Oral Q8H PRN Earney NavyJosephine C Onuoha, NP      . LORazepam (ATIVAN) tablet 1 mg  1 mg Oral Q8H PRN Earney NavyJosephine C Onuoha, NP   1 mg at 08/11/15 0933  . magnesium hydroxide (MILK OF MAGNESIA) suspension 30 mL  30 mL Oral Daily PRN Earney NavyJosephine C Onuoha, NP      . mirtazapine (REMERON) tablet 45 mg  45 mg Oral QHS Jomarie LongsSaramma Zidane Renner, MD   45 mg at 08/10/15 2101  . nicotine (NICODERM CQ - dosed in mg/24 hours) patch 21 mg  21 mg Transdermal Daily Earney NavyJosephine C Onuoha, NP   21 mg at 08/10/15 0808  . ondansetron (ZOFRAN) tablet 4 mg  4 mg Oral Q8H PRN Earney NavyJosephine C Onuoha, NP   4 mg at 08/08/15 1422  . traZODone (DESYREL) tablet 50 mg  50 mg Oral QHS PRN Jomarie LongsSaramma Elhadj Girton, MD        Lab Results:  No results found for this or any previous visit (from the past 48 hour(s)).  Physical Findings: AIMS: Facial and Oral Movements Muscles of Facial Expression: None, normal Lips and Perioral Area: None, normal Jaw: None, normal Tongue: None, normal,Extremity Movements Upper (arms, wrists, hands, fingers): None, normal Lower (legs, knees, ankles, toes): None, normal, Trunk Movements Neck, shoulders, hips: None, normal, Overall Severity Severity of abnormal movements (highest score from questions above): None, normal Incapacitation due to abnormal movements: None, normal Patient's awareness of abnormal movements (rate only patient's report): No Awareness, Dental Status Current problems with teeth and/or dentures?: No Does patient usually wear dentures?: No  CIWA:  CIWA-Ar Total: 1 COWS:  COWS Total Score: 2  Musculoskeletal: Strength & Muscle Tone: within normal limits Gait & Station:  normal Patient leans: N/A  Psychiatric Specialty Exam: Review of Systems  Psychiatric/Behavioral: Positive for depression and hallucinations. The patient is nervous/anxious.   All other systems reviewed and are negative.  denies headache, denies chest pain, denies shortness of breath, no rash   Blood pressure 137/76, pulse 95, temperature 97.7 F (36.5 C), temperature source Oral, resp. rate 18, height 5\' 11"  (1.803 m), weight 92.534 kg (204 lb), SpO2 100 %.Body mass index is 28.46 kg/(m^2).  General Appearance: Casual and Well Groomed  Eye Contact::  Good  Speech:  Slow and Has stuttering  Volume:  soft   Mood:  Anxious, Dysphoric and Irritable  Affect:  Labile   Thought Process:  Coherent  Orientation:  Full (Time, Place, and Person)  Thought Content:  Delusions and Rumination talking about being a completely different person and having a new name.  Suicidal Thoughts:  No at this time denies any suicidal ideations, denies any self injurious ideations  Homicidal Thoughts:  No   Memory:  recent and remote grossly intact , IMMEDIATE - FAIR  Judgement:  Fair  Insight:  Fair  Psychomotor Activity:  Restlessness  Concentration:  Good  Recall:  Good  Fund of Knowledge:Good  Language: Good  Akathisia:  No  Handed:  Right  AIMS (if indicated):     Assets:  Desire for Improvement Resilience Social Support  ADL's:  Intact  Cognition: WNL  Sleep:  Number of Hours: 6.75     Treatment Plan Summary:Patient today continues to appear anxious , irritable, delusional and has somatic complaints, needs redirection on the unit.  Pt with limited intellectual function , will continue treatment.    rDaily contact with patient to assess and evaluate symptoms and progress in treatment, Medication management, Plan inpatient admission and medications as below Continue to encourage milieu, group participation to work on coping skills and symptom reduction Continue Remeron to 45 mgrs PO QHS for  depression Continue Trazodone 50 mg po qhs prn for sleep.Discontinue Ambien. Increase Abilify to 15 mgrs PO QHS for psychotic symptoms Continue Vistaril 25 mgrs Q 6 hours  PRN for anxiety, as needed  Add Pepcid 40 mg po daily in two divided doses for abdominal sx. Based on patient's reported concerns about STDs-  RPR/HIV- NON REACTIVE ( 08/09/15)  Marlyce Mcdougald, md 08/11/2015, 11:21 AM
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
## 2015-08-11 NOTE — Progress Notes (Signed)
D: Pt has anxious affect and mood.  He interacts with others cautiously.  Pt reports he had a good visit with his mother and grandfather tonight.  He reports he had a "good" day.  Pt denies SI/HI, denies hallucinations, denies pain.  Pt attended evening group.   A: Introduced self to pt.  Met with pt 1:1 and offered support and encouragement.  Actively listened to pt.  Medications administered per order.   R: Pt is compliant with medications.  Pt verbally contracts for safety.  Will continue to monitor and assess.

## 2015-08-11 NOTE — [facility] Group Notes (Signed)
[facility]  08/11/2015 , 1:24 PM    Type of Therapy:  [facility] Presentation  Participation Level:  Active  Participation Quality:  Attentive  Affect:  Blunted  Cognitive:  Oriented  Insight:  Limited  Engagement in Therapy:  Engaged  Modes of Intervention:  Discussion, Education and Socialization  Summary of Progress/Problems:  Onalee HuaDavid from [facility] came to present his recovery story and play the guitar.  Invited.  In bed, asleep.  Came in later for 10 minutes.  Left again, and did not return.  [name], Alexsia Klindt B 08/11/2015 , 1:24 PM

## 2015-08-12 MED ORDER — BUSPIRONE HCL 5 MG PO TABS
5.0000 mg | ORAL_TABLET | Freq: Three times a day (TID) | ORAL | Status: DC
  Administered 2015-08-12 – 2015-08-13 (×4): 5 mg via ORAL
  Filled 2015-08-12 (×9): qty 1

## 2015-08-12 MED ORDER — ARIPIPRAZOLE 5 MG PO TABS
5.0000 mg | ORAL_TABLET | Freq: Every day | ORAL | Status: DC
  Administered 2015-08-12 – 2015-08-13 (×2): 5 mg via ORAL
  Filled 2015-08-12 (×3): qty 1
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
## 2015-08-12 NOTE — Progress Notes (Signed)
1:1 note:   D: Pt is currently watching television in the day room.  He denies SI/HI, denies hallucinations, denies pain.  He is calm and cooperative at this time.  He reports his goal today is to "go home."   A: 1:1 monitoring continues for safety.    R: Pt is safe on the unit.  He reports that he will talk to staff if he has thoughts of hurting himself or others prior to trying to act on them.  Pt denies needs and concerns at this time.  Will continue to monitor.

## 2015-08-12 NOTE — Progress Notes (Signed)
EKG completed and was abnormal.  Notified [name] NP of the findings of the EKG.  Plan to monitor vital signs.  He appears to be in no physical distress.  We will continue to monitor the progress towards his goals.

## 2015-08-12 NOTE — Progress Notes (Signed)
Close Observation DAR Note: Marvin Crawford remains on one to one with sitter in sight.  Marvin Crawford was able to go down to gym for lunch.  He was able to understand that he will not be leaving today and that he will need to come back to the unit without problems.  Per MHT, he was very appropriate in the gym and did come back when asked.  He continues to voice no SI/HI or A/V hallucinations.  He states that he is looking forward to going home tomorrow.  Encouraged him to continue to follow the rules on the unit and attend group/unit activities.  Sitter remains by side.  Q 15 minute checks maintained for safety.  We will continue to monitor the progress towards his goals.  Marvin Crawford remains safe on the unit.
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
## 2015-08-12 NOTE — Progress Notes (Signed)
Recreation Therapy Notes  01.05.2017 Approximately 2:45pm. LRT met with patient to continue work on coping skills identification and plan for use post d/c. Patient reports he threw away the worksheet started during previous 1:1. LRT encouraged patient to keep materials provided during tx, patient agreed. LRT asked patient to identify the emotions he know. Patient responded with angry, mad, upset and sad. Patient related all feelings to others actions towards him, for example "When people leave you." Patient able to identify coping skill of deep breathing for both mad and upset, but unable to why this would be a good coping skill for these emotions. Patient expressed he anticipates d/c this afternoon, LRT attempted to divert patient from this, as he is not scheduled for d/c, however patient unwaivering on this point.   Approximately 3:00pm - patient approached LRT at RN desk and asked if he was d/c today, LRT stated he would not, Patient expressed joy that he was not leaving at this time, stating he likes inpatient hospitalization and he is happy to stay in the hospital for some time.   Marvin Crawford 08/12/2015 4:02 PM

## 2015-08-12 NOTE — Progress Notes (Signed)
1:1 note:   D: Pt is resting in bed with his eyes closed.  Respirations are even and unlabored.  Pt does not appear to be in any acute distress.  A: 1:1 staff remains with pt for safety.    R: Pt is safe on the unit.  Will continue to monitor and assess.

## 2015-08-12 NOTE — Progress Notes (Signed)
Patient was in bed at the beginning of the shift resting. Patient remains paranoid and delusional. He refused his HS medication because of the color of Abilify. He said his Abilify was blue. Writer explained to patient that sometimes colors of medication might change due different dosage or manufacturer. He insisted that Clinical research associatewriter wrote the names of the medications for him, he then went and called the other RN on the unit to explain to him why the color of the Abilify was blue. He later took the medication from the other RN. 1:1 observations continues for safety.

## 2015-08-12 NOTE — Progress Notes (Addendum)
Close Observation DAR Note: Marvin Crawford has been very agitated this afternoon.  He has been pacing the hall repeatedly saying "yes, yes, yes."  He states that he has been hearing voices and the voices having been telling him that he isn't supposed to live.  PRN Zyprexa given with no relief.  He is stating that his name is Marvin Crawford and that he is going home tomorrow.  Sitter by side for safety.  He didn't eat much supper tonight but was noted eating a lot of snacks.  Close Observation continues for safety.  Encouraged continued participation in group and unit activities.  Q 15 minute checks maintained for safety.  We will continue to monitor the progress towards his goals.  Marvin Crawford remains safe on the unit.

## 2015-08-12 NOTE — [facility] Group Notes (Signed)
[facility] Group Notes:  (Nursing/MHT/Case Management/Adjunct)  Date:  08/12/2015  Time:  10:52 AM  Type of Therapy:  Nurse Education  Participation Level:  Active  Participation Quality:  Appropriate and Attentive  Affect:  Appropriate  Cognitive:  Alert and Appropriate  Insight:  Appropriate and Good  Engagement in Group:  Engaged  Modes of Intervention:  Activity, Discussion, Education and Exploration  Summary of Progress/Problems: Topic was on leisure and lifestyle changes. Discussed the importance of choosing a healthy leisure activities. Group encouraged to surround themselves with positive and healthy group/support system when changing to a healthy lifestyle. Patient was receptive.   Marvin Crawford 08/12/2015, 10:52 AM

## 2015-08-12 NOTE — Progress Notes (Signed)
Adult Psychoeducational Group Note  Date:  08/12/2015 Time:  9:07 PM  Group Topic/Focus:  Wrap-Up Group:   The focus of this group is to help patients review their daily goal of treatment and discuss progress on daily workbooks.  Participation Level:  Active  Participation Quality:  Appropriate  Affect:  Appropriate  Cognitive:  Appropriate  Insight: Good  Engagement in Group:  Engaged  Modes of Intervention:  Activity  Additional Comments:  Patient rated his day a 2. Patient stated his goal is to be nicer to others.  Claria DiceKiara M Krisi Azua 08/12/2015, 9:07 PM

## 2015-08-12 NOTE — Progress Notes (Signed)
1:1 DAR Note: Marvin Crawford has been switched to close observation with sitter by side.  He denies any SI/HI or A/V hallucinations.  He took his medications without difficulty.  He attended morning group but had minimal participation.  He completed his self inventory and reports that his depression, hopelessness and anxiety are 0/10.  He states that his goal for today is to "be calm" and he will accomplish this goal by "being respectful."  Close observation continues to for safety.  Encouraged continued participation in group and unit activities.  Q 15 minutes checks for safety.  We will continue to monitor the progress towards his goals.

## 2015-08-13 MED ORDER — ARIPIPRAZOLE 5 MG PO TABS: ORAL_TABLET | ORAL | Status: DC

## 2015-08-13 MED ORDER — FAMOTIDINE 20 MG PO TABS: 20.0000 mg | ORAL_TABLET | Freq: Two times a day (BID) | ORAL | Status: DC

## 2015-08-13 MED ORDER — NICOTINE 21 MG/24HR TD PT24: 21.0000 mg | MEDICATED_PATCH | Freq: Every day | TRANSDERMAL | Status: DC

## 2015-08-13 MED ORDER — MIRTAZAPINE 45 MG PO TABS: 45.0000 mg | ORAL_TABLET | Freq: Every day | ORAL | Status: DC

## 2015-08-13 MED ORDER — BENZTROPINE MESYLATE 0.5 MG PO TABS: 0.5000 mg | ORAL_TABLET | Freq: Every day | ORAL | Status: DC

## 2015-08-13 MED ORDER — BUSPIRONE HCL 5 MG PO TABS: 5.0000 mg | ORAL_TABLET | Freq: Three times a day (TID) | ORAL | Status: DC

## 2015-08-13 MED ORDER — TRAZODONE HCL 50 MG PO TABS: 50.0000 mg | ORAL_TABLET | Freq: Every evening | ORAL | Status: DC | PRN

## 2015-08-13 MED ORDER — HYDROXYZINE HCL 25 MG PO TABS
25.0000 mg | ORAL_TABLET | Freq: Four times a day (QID) | ORAL | Status: DC | PRN
Start: 2015-08-13 — End: 2015-09-16

## 2015-08-13 NOTE — Progress Notes (Signed)
  [facility] :  Will you be returning to the same living situation after discharge:  Yes,  home with mother At discharge, do you have transportation home?: Yes,  mother Do you have the ability to pay for your medications: Yes,  MCD  Release of information consent forms completed and in the chart;  Patient's signature needed at discharge.  Patient to Follow up at: Follow-up Information    Follow up with [facility] . Go on 08/16/2015.   Why:  You have an appointment on January 9th, 2017 at 1:00pm. You will be given an appointment for medication management when you for this appointment.   Contact information:   [facility]  [address] [place], [place], [place] [address] [phone]      Next level of care provider [facility]:no  Safety Planning and Suicide Prevention discussed: Yes,  yes  Have you used any form of tobacco in the last 30 days? (Cigarettes, Smokeless Tobacco, Cigars, and/or Pipes): No  Has patient been referred to the Quitline?: N/A patient is not a smoker  Patient has been referred for addiction treatment: Yes  [name], Aleczander Fandino B 08/13/2015, 10:57 AM
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
## 2015-08-13 NOTE — Progress Notes (Signed)
Patient resting quietly with eyes closed . Respirations even and unlabored. No distress noted. 1:1 observations continues as ordered for safety.

## 2015-08-13 NOTE — Progress Notes (Signed)
Patient reported having upset stomach this morning. He said he stopped taking his medications for 2 years before coming in for treatment and that the medications he is taking now are causing the stomach upset and making him sick. He also stated that he has been having unproductive sex and he would like to have HIV test done. Writer offered patient ginger ale and Mylanta. He drank the ginger ale but refused to take the Mylanta after it was opened. Patient remains delusional and paranoid. Writer also encouraged patient to report to let the physician know about his concern. Will notified day shift RN to give patient HIV consent form. 1:1 observations continues for safety.

## 2015-08-13 NOTE — Progress Notes (Signed)
D: Patient pleasant on approach this am. Mostly complains of stomach ache but better after gingerale and Pepcid. Reports depression but no active SI at this time. No urges to harm self today. Behavior still remains childlike on the unit and he is very needy. Mother wants patient to discharge to her today. A: Staff will monitor on close observation for safety.\ R: Cooperative on the unit.

## 2015-08-13 NOTE — Progress Notes (Signed)
Patient ID: Marvin Crawford, male   DOB: [birth year], 20 y.o.   MRN: [id number]  D: Patient spoke with physician and social work. Mother wants him to come home today and the treatment team agreed to send him home. No active SI at this time. At baseline of functioning. A: Obtained all belongings, prescriptions, & follow-up appointment. R: Mother came to pick up. Went over morning medication and noon medication with mother.

## 2015-08-13 NOTE — [facility] Suicide Risk Assessment (Signed)
[facility]   Demographic Factors:  Male  Total Time spent with patient: 30 minutes  Musculoskeletal: Strength & Muscle Tone: within normal limits Gait & Station: normal Patient leans: N/A  Psychiatric Specialty Exam: Physical Exam  Review of Systems  Psychiatric/Behavioral: Positive for substance abuse. Negative for depression, suicidal ideas and hallucinations. The patient is not nervous/anxious.   All other systems reviewed and are negative.   Blood pressure 130/105, pulse 84, temperature 98.3 F (36.8 C), temperature source Oral, resp. rate 20, height 5\' 11"  (1.803 m), weight 92.534 kg (204 lb), SpO2 100 %.Body mass index is 28.46 kg/(m^2).  General Appearance: Casual  Eye Contact::  Fair  Speech:  Clear and Coherent409  Volume:  Normal  Mood:  Euthymic  Affect:  Appropriate  Thought Process:  Coherent  Orientation:  Full (Time, Place, and Person)  Thought Content:  WDL  Suicidal Thoughts:  No  Homicidal Thoughts:  No  Memory:  Immediate;   Fair Recent;   Fair Remote;   Fair  Judgement:  Fair  Insight:  Lacking  Psychomotor Activity:  Normal  Concentration:  Fair  Recall:  FiservFair  Fund of Knowledge:Fair  Language: Fair  Akathisia:  No  Handed:  Right  AIMS (if indicated):     Assets:  Communication Skills Desire for Improvement  Sleep:  Number of Hours: 6.75  Cognition: WNL  ADL's:  Intact   Have you used any form of tobacco in the last 30 days? (Cigarettes, Smokeless Tobacco, Cigars, and/or Pipes): No  Has this patient used any form of tobacco in the last 30 days? (Cigarettes, Smokeless Tobacco, Cigars, and/or Pipes) No  Mental Status Per Nursing Assessment::   On Admission:  NA  Current Mental Status by Physician: Pt denies SI/HI/AH/VH  Loss Factors: NA  Historical Factors: Impulsivity  Risk Reduction Factors:   Positive social support  Continued Clinical Symptoms:  Alcohol/Substance Abuse/Dependencies Previous  Psychiatric Diagnoses and Treatments  Cognitive Features That Contribute To Risk:  Thought constriction (tunnel vision)    Suicide Risk:  Minimal: No identifiable suicidal ideation.  Patients presenting with no risk factors but with morbid ruminations; may be classified as minimal risk based on the severity of the depressive symptoms  Principal Problem: MDD (major depressive disorder), recurrent, severe, with psychosis [facility](HCC) Discharge Diagnoses:  Patient Active Problem List   Diagnosis Date Noted  . 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 Stuttering [F80.81] 08/09/2015  . Cannabis use disorder, moderate, dependence (HCC) [F12.20] 08/09/2015  . Alcohol abuse [F10.10] 02/17/2013  . MDD (major depressive disorder), recurrent, severe, with psychosis (HCC) [F33.3] 02/16/2013    Follow-up Information    Follow up with [facility] . Go on 08/16/2015.   Why:  You have an appointment on January 9th, 2017 at 1:00pm. You will be given an appointment for medication management when you for this appointment.   Contact information:   [facility]  [address] Judie Petit[place], [place], [place] [address] [phone]      Plan Of Care/Follow-up recommendations:  Activity:  No restrictions Diet:  regular Tests:  as needed Other:  follow up with after care  Is patient on multiple antipsychotic therapies at discharge:  No   Has Patient had three or more failed trials of antipsychotic monotherapy by history:  No  Recommended Plan for Multiple Antipsychotic Therapies: NA    Tyon Cerasoli md  08/13/2015, 9:42 AM

## 2015-09-09 ENCOUNTER — Encounter (HOSPITAL_COMMUNITY): Payer: Self-pay

## 2015-09-09 ENCOUNTER — Emergency Department (HOSPITAL_COMMUNITY)
Admission: EM | Admit: 2015-09-09 | Discharge: 2015-09-09 | Disposition: A | Discharge status: Other Acute Inpatient Hospital | Payer: Medicaid Other | Attending: Emergency Medicine | Admitting: Emergency Medicine

## 2015-09-09 ENCOUNTER — Inpatient Hospital Stay (HOSPITAL_COMMUNITY)
Admission: RE | Admit: 2015-09-09 | Discharge: 2015-09-16 | DRG: 885 | Disposition: A | Discharge status: Home / Self Care | Payer: Medicaid Other | Attending: Psychiatry | Admitting: Psychiatry

## 2015-09-09 ENCOUNTER — Encounter (HOSPITAL_COMMUNITY): Payer: Self-pay | Admitting: Emergency Medicine

## 2015-09-09 DIAGNOSIS — F329 Major depressive disorder, single episode, unspecified: Secondary | ICD-10-CM | POA: Diagnosis not present

## 2015-09-09 DIAGNOSIS — F39 Unspecified mood [affective] disorder: Secondary | ICD-10-CM | POA: Diagnosis present

## 2015-09-09 DIAGNOSIS — R45851 Suicidal ideations: Secondary | ICD-10-CM | POA: Diagnosis present

## 2015-09-09 DIAGNOSIS — G4700 Insomnia, unspecified: Secondary | ICD-10-CM | POA: Diagnosis present

## 2015-09-09 DIAGNOSIS — R4183 Borderline intellectual functioning: Secondary | ICD-10-CM

## 2015-09-09 DIAGNOSIS — F1220 Cannabis dependence, uncomplicated: Secondary | ICD-10-CM | POA: Diagnosis present

## 2015-09-09 DIAGNOSIS — Z23 Encounter for immunization: Secondary | ICD-10-CM

## 2015-09-09 DIAGNOSIS — F459 Somatoform disorder, unspecified: Secondary | ICD-10-CM

## 2015-09-09 DIAGNOSIS — Z87891 Personal history of nicotine dependence: Secondary | ICD-10-CM

## 2015-09-09 DIAGNOSIS — R41843 Psychomotor deficit: Secondary | ICD-10-CM

## 2015-09-09 DIAGNOSIS — K219 Gastro-esophageal reflux disease without esophagitis: Secondary | ICD-10-CM | POA: Diagnosis present

## 2015-09-09 DIAGNOSIS — F8081 Childhood onset fluency disorder: Secondary | ICD-10-CM | POA: Diagnosis present

## 2015-09-09 DIAGNOSIS — F333 Major depressive disorder, recurrent, severe with psychotic symptoms: Principal | ICD-10-CM | POA: Diagnosis present

## 2015-09-09 DIAGNOSIS — Z8249 Family history of ischemic heart disease and other diseases of the circulatory system: Secondary | ICD-10-CM

## 2015-09-09 DIAGNOSIS — Z79899 Other long term (current) drug therapy: Secondary | ICD-10-CM | POA: Insufficient documentation

## 2015-09-09 DIAGNOSIS — F419 Anxiety disorder, unspecified: Secondary | ICD-10-CM | POA: Diagnosis not present

## 2015-09-09 DIAGNOSIS — Z008 Encounter for other general examination: Secondary | ICD-10-CM | POA: Diagnosis present

## 2015-09-09 LAB — CBC
HCT: 42.5 % (ref 39.0–52.0)
Hemoglobin: 15.2 g/dL (ref 13.0–17.0)
MCH: 29.5 pg (ref 26.0–34.0)
MCHC: 35.8 g/dL (ref 30.0–36.0)
MCV: 82.4 fL (ref 78.0–100.0)
PLATELETS: 236 10*3/uL (ref 150–400)
RBC: 5.16 MIL/uL (ref 4.22–5.81)
RDW: 12.2 % (ref 11.5–15.5)
WBC: 4.3 10*3/uL (ref 4.0–10.5)

## 2015-09-09 LAB — RAPID URINE DRUG SCREEN, HOSP PERFORMED
AMPHETAMINES: NOT DETECTED
BENZODIAZEPINES: NOT DETECTED
Barbiturates: NOT DETECTED
Cocaine: NOT DETECTED
OPIATES: NOT DETECTED
Tetrahydrocannabinol: NOT DETECTED

## 2015-09-09 LAB — COMPREHENSIVE METABOLIC PANEL
ALT: 22 U/L (ref 17–63)
ANION GAP: 10 (ref 5–15)
AST: 25 U/L (ref 15–41)
Albumin: 5.2 g/dL — ABNORMAL HIGH (ref 3.5–5.0)
Alkaline Phosphatase: 65 U/L (ref 38–126)
BUN: 10 mg/dL (ref 6–20)
CHLORIDE: 105 mmol/L (ref 101–111)
CO2: 27 mmol/L (ref 22–32)
Calcium: 10.6 mg/dL — ABNORMAL HIGH (ref 8.9–10.3)
Creatinine, Ser: 1.2 mg/dL (ref 0.61–1.24)
Glucose, Bld: 105 mg/dL — ABNORMAL HIGH (ref 65–99)
POTASSIUM: 3.9 mmol/L (ref 3.5–5.1)
Sodium: 142 mmol/L (ref 135–145)
TOTAL PROTEIN: 8 g/dL (ref 6.5–8.1)
Total Bilirubin: 0.9 mg/dL (ref 0.3–1.2)

## 2015-09-09 LAB — ETHANOL

## 2015-09-09 LAB — SALICYLATE LEVEL

## 2015-09-09 LAB — ACETAMINOPHEN LEVEL

## 2015-09-09 MED ORDER — BUSPIRONE HCL 5 MG PO TABS
5.0000 mg | ORAL_TABLET | Freq: Three times a day (TID) | ORAL | Status: DC
  Administered 2015-09-10: 5 mg via ORAL
  Filled 2015-09-09 (×5): qty 1

## 2015-09-09 MED ORDER — MAGNESIUM HYDROXIDE 400 MG/5ML PO SUSP: 30.0000 mL | Freq: Every day | ORAL | Status: DC | PRN

## 2015-09-09 MED ORDER — BENZTROPINE MESYLATE 0.5 MG PO TABS
0.5000 mg | ORAL_TABLET | Freq: Every day | ORAL | Status: DC
  Administered 2015-09-09 – 2015-09-15 (×7): 0.5 mg via ORAL
  Filled 2015-09-09 (×10): qty 1

## 2015-09-09 MED ORDER — MIRTAZAPINE 45 MG PO TABS
45.0000 mg | ORAL_TABLET | Freq: Every day | ORAL | Status: DC
  Administered 2015-09-09: 45 mg via ORAL
  Filled 2015-09-09: qty 3
  Filled 2015-09-09 (×2): qty 1

## 2015-09-09 MED ORDER — ACETAMINOPHEN 325 MG PO TABS
650.0000 mg | ORAL_TABLET | Freq: Four times a day (QID) | ORAL | Status: DC | PRN
Start: 2015-09-09 — End: 2015-09-16

## 2015-09-09 MED ORDER — FAMOTIDINE 20 MG PO TABS
20.0000 mg | ORAL_TABLET | Freq: Two times a day (BID) | ORAL | Status: DC
  Administered 2015-09-09 – 2015-09-16 (×14): 20 mg via ORAL
  Filled 2015-09-09 (×19): qty 1

## 2015-09-09 MED ORDER — HYDROXYZINE HCL 25 MG PO TABS
25.0000 mg | ORAL_TABLET | Freq: Four times a day (QID) | ORAL | Status: DC | PRN
  Administered 2015-09-13: 25 mg via ORAL
  Filled 2015-09-09: qty 1

## 2015-09-09 MED ORDER — LORAZEPAM 1 MG PO TABS
1.0000 mg | ORAL_TABLET | Freq: Once | ORAL | Status: AC
  Administered 2015-09-09: 1 mg via ORAL
  Filled 2015-09-09: qty 1

## 2015-09-09 MED ORDER — ALUM & MAG HYDROXIDE-SIMETH 200-200-20 MG/5ML PO SUSP: 30.0000 mL | ORAL | Status: DC | PRN

## 2015-09-09 MED ORDER — ARIPIPRAZOLE 10 MG PO TABS
10.0000 mg | ORAL_TABLET | Freq: Two times a day (BID) | ORAL | Status: DC
  Administered 2015-09-09 – 2015-09-13 (×8): 10 mg via ORAL
  Filled 2015-09-09 (×14): qty 1

## 2015-09-09 NOTE — BH Assessment (Signed)
Assessment Note  Marvin Crawford is an 20 y.o. male.   Diagnosis: Mood Disorder   Past Medical History:  Past Medical History  Diagnosis Date  . Environmental allergies   . ADD (attention deficit disorder)   . Depression   . Anxiety     History reviewed. No pertinent past surgical history.  Family History:  Family History  Problem Relation Age of Onset  . Hypertension Mother     Social History:  reports that he has never smoked. He does not have any smokeless tobacco history on file. He reports that he uses illicit drugs (Other-see comments). He reports that he does not drink alcohol.  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 1 - Age of First Use: 20 yrs old 1 - Amount (size/oz): one joint daily 1 - Frequency: daily 1 - Duration: since age 67 yrs 1 - Last Use / Amount: one blunt  CIWA: CIWA-Ar BP: (!) 130/105 mmHg Pulse Rate: 84 Nausea and Vomiting: no nausea and no vomiting Tactile Disturbances: none Tremor: no tremor Auditory Disturbances: not present Paroxysmal Sweats: no sweat visible Visual Disturbances: not present Anxiety: mildly anxious Headache, Fullness in Head: none present Agitation: normal activity Orientation and Clouding of Sensorium: oriented and can do serial additions CIWA-Ar Total: 1 COWS: Clinical Opiate Withdrawal Scale (COWS) Resting Pulse Rate: Pulse Rate 81-100 Sweating: No report of chills or flushing Restlessness: Able to sit still Pupil Size: Pupils pinned or normal size for room light Bone or Joint Aches: Not present Runny Nose or Tearing: Not present GI Upset: No GI  symptoms Tremor: No tremor Yawning: No yawning Anxiety or Irritability: Patient reports increasing irritability or anxiousness Gooseflesh Skin: Skin is smooth COWS Total Score: 2  Allergies: No Known Allergies  Home Medications:  No prescriptions prior to admission    OB/GYN Status:  No LMP for male patient.  General Assessment Data [facility] TTS Assessment: In system Is this a Tele or Face-to-Face Assessment?: Face-to-Face Is this an Initial Assessment or a Re-assessment for this encounter?: Initial Assessment Marital status: Single Maiden name: NA Is patient pregnant?: No Pregnancy Status: No Living Arrangements: Parent, Other relatives Can pt return to current living arrangement?: Yes Admission Status: Voluntary Is patient capable of signing voluntary admission?: Yes Referral Source: Self/Family/Friend Insurance type: Medicaid  Medical Screening Exam [facility] Walk-in ONLY) Medical Exam completed: Yes  Crisis Care Plan Living Arrangements: Parent, Other relatives Legal Guardian:  (NA) [facility] of [facility]iatrist[facility]  [facility] of [facility]rapist: [facility]  Education Status Is patient currently in school?: No Current Grade: NA Highest grade of school patient has completed: 12TH Name of school: [employer] person: NA  Risk to self with the past 6 months Suicidal Ideation: Yes-Currently Present Has patient been a risk to self within the past 6 months prior to admission? : Yes Suicidal Intent: Yes-Currently Present Has patient had any suicidal intent within the past 6 months prior to admission? : Yes Is patient at risk for suicide?: Yes Suicidal Plan?: Yes-Currently Present Has patient had any suicidal plan within the past 6 months prior to admission? : Yes Specify Current Suicidal Plan: Patient  reprots that the voices will tell him how to kill himself Access to Means: No What has been your use of drugs/alcohol within the last 12  months?: NA Previous Attempts/Gestures: No How many times?: 0 Other Self Harm Risks: None Reported Triggers for Past Attempts: None known Intentional Self Injurious Behavior: None Family Suicide History: No Recent stressful life event(s): Other (Comment) (Hearing voices and hallucinations) Persecutory voices/beliefs?: Yes Depression: Yes Depression Symptoms: Despondent, Insomnia, Tearfulness, Isolating, Fatigue, Guilt, Loss of interest in usual pleasures, Feeling worthless/self pity Substance abuse history and/or treatment for substance abuse?: No Suicide prevention information given to non-admitted patients: Yes  Risk to Others within the past 6 months Homicidal Ideation: No Does patient have any lifetime risk of violence toward others beyond the six months prior to admission? : No Thoughts of Harm to Others: No Current Homicidal Intent: No Current Homicidal Plan: No Access to Homicidal Means: No Identified Victim: None Reported History of harm to others?: No Assessment of Violence: None Noted Violent Behavior Description: None Reported Does patient have access to weapons?: No Criminal Charges Pending?: No Does patient have a court date: No Is patient on probation?: No  Psychosis Hallucinations: Auditory, Visual Delusions: None noted  Mental Status Report Appearance/Hygiene: Unremarkable Eye Contact: Good Motor Activity: Freedom of movement Speech: Soft, Slow, Tangential Level of Consciousness: Alert Mood: Anxious, Despair Affect: Blunted, Sad, Flat Anxiety Level: None Thought Processes: Flight of Ideas Judgement: Unimpaired Orientation: Place, Person, Situation, Time Obsessive Compulsive Thoughts/Behaviors: None  Cognitive Functioning Concentration: Decreased Memory: Recent Intact, Remote Intact IQ: Average Insight: Fair Impulse Control: Poor Appetite: Poor Weight Loss: 5 Weight Gain: 0 Sleep: Decreased Total Hours of Sleep: 3 Vegetative Symptoms:  Decreased grooming  ADLScreening [facility] Assessment Services) Patient's cognitive ability adequate to safely complete daily activities?: Yes Patient able to express need for assistance with ADLs?: No Independently performs ADLs?: Yes (appropriate for developmental age)  Prior Inpatient Therapy Prior Inpatient Therapy: Yes Prior Therapy Dates: 2016; 2014 Prior Therapy Facilty/Provider(s): [facility] Reason for Treatment: Behavioral  Prior Outpatient Therapy Prior Outpatient Therapy: Yes Prior Therapy Dates: Ongoing  Prior Therapy [facility] Reason for Treatment: Meds and Therapy Does patient have an ACCT team?: No Does patient have Intensive In-House Services?  : No Does patient have [facility] services? : No Does patient have P4CC services?: No  ADL Screening (condition at time of admission) Patient's cognitive ability adequate to safely complete daily activities?: Yes Is the patient deaf or have difficulty hearing?: No Does the patient have difficulty seeing, even when wearing glasses/contacts?: No Does the patient have difficulty concentrating, remembering, or making decisions?: Yes Patient able to express need for assistance with ADLs?: No Does the patient have difficulty dressing or bathing?: No Independently performs ADLs?: Yes (appropriate for developmental age) Does the patient have difficulty walking or climbing stairs?: No Weakness of Legs: None Weakness of Arms/Hands: None  Home Assistive Devices/Equipment Home Assistive Devices/Equipment: None  Therapy Consults (therapy consults require a physician order) PT Evaluation Needed: No OT Evalulation Needed: No SLP Evaluation Needed: No Abuse/Neglect Assessment (Assessment to be complete while patient is alone) Physical Abuse: Denies Verbal Abuse: Denies Sexual Abuse: Denies Exploitation of patient/patient's resources: Denies Self-Neglect: Denies Possible abuse reported to:: Other (Comment) (no abuse to  report) Values / Beliefs Cultural Requests During Hospitalization: None Spiritual Requests During Hospitalization: None Consults Spiritual Care Consult Needed: No Social Work Consult Needed: No Merchant navy officer (For Healthcare) Does patient have an advance directive?: No Would patient like information on creating  an advanced directive?: No - patient declined information Nutrition Screen- [facility] Patient's home diet: Regular Has the patient recently lost weight without trying?: No Has the patient been eating poorly because of a decreased appetite?: No Malnutrition Screening Tool Score: 0  Additional Information 1:1 In Past 12 Months?: No CIRT Risk: No Elopement Risk: No Does patient have medical clearance?: Yes     Disposition: Per Renata Caprice, patient meets criteria for inpatient hospitalization.  Per Minerva Areola [facility]) patient accepted to [facility] Bed 502-3.  Patient has signed support paperwork.  Disposition Initial Assessment Completed for this Encounter: Yes  On Site Evaluation by:   Reviewed with Physician:    Phillip Heal LaVerne 09/09/2015 2:39 PM

## 2015-09-09 NOTE — BHH Group Notes (Signed)
Pt did not attend karaoke group.  Bray Vickerman, MHT  

## 2015-09-09 NOTE — Tx Team (Signed)
Initial Interdisciplinary Treatment Plan   PATIENT STRESSORS: Marital or family conflict Medication change or noncompliance   PATIENT STRENGTHS: Supportive family/friends   PROBLEM LIST: Problem List/Patient Goals Date to be addressed Date deferred Reason deferred Estimated date of resolution  Psychosis                                                       DISCHARGE CRITERIA:  Improved stabilization in mood, thinking, and/or behavior Motivation to continue treatment in a less acute level of care Need for constant or close observation no longer present Verbal commitment to aftercare and medication compliance  PRELIMINARY DISCHARGE PLAN: Attend aftercare/continuing care group Attend PHP/IOP Outpatient therapy Return to previous living arrangement  PATIENT/FAMIILY INVOLVEMENT: This treatment plan has been presented to and reviewed with the patient, Marvin Crawford.  The patient and family have been given the opportunity to ask questions and make suggestions.  Lauris Poag 09/09/2015, 7:08 PM

## 2015-09-09 NOTE — ED Notes (Signed)
Bed: WTR8 Expected date:  Expected time:  Means of arrival:  Comments: Phlebotomy

## 2015-09-09 NOTE — BH Assessment (Addendum)
Assessment Note  Marvin Crawford is a 20 year old male who presented to the [facility] as a walk in with his mother.   Patient reports hearing voices telling him to harm himself.  Patient denies having a plan.  Patient reports that the voices will tell him how to kill himself.  Patient reports see hallucinations; however, the patient is not able to describe what he is seeing.   Patients mother reports that the patient that the patient has been confused and he was running out of the leaving the house.  Patient mother reports that his speech is slow and delayed.  Patient and his mother reports that he does not had any head traumas in the past. Patient was recently discharged from [facility] on 08-05-2015 on the [address].   Patient mother reports that the patient has been responding to internal stimuli for the past couple of days.  His mother reports that he took him to his outpatient provider [facility] and the instructed her to bring him to [facility] for inpatient psychiatric hospitalization.    Patient denies HI and Substance Abuse. Patient denies physical, sexual or emotional abuse.     Diagnosis: Mood Disorder   Past Medical History:  Past Medical History  Diagnosis Date  . Environmental allergies   . ADD (attention deficit disorder)   . Depression   . Anxiety     History reviewed. No pertinent past surgical history.  Family History:  Family History  Problem Relation Age of Onset  . Hypertension Mother     Social History:  reports that he has never smoked. He does not have any smokeless tobacco history on file. He reports that he uses illicit drugs (Other-see comments). He reports that he does not drink alcohol.  Additional Social History:  Alcohol / Drug Use History of alcohol / drug use?: No history of alcohol / drug abuse  CIWA: CIWA-Ar BP: 136/91 mmHg Pulse Rate: 84 COWS:    Allergies: No Known Allergies  Home Medications:  (Not in a hospital admission)  OB/GYN Status:  No  LMP for male patient.  General Assessment Data Location of Assessment: [facility] Assessment Services TTS Assessment: In system Is this a Tele or Face-to-Face Assessment?: Face-to-Face Is this an Initial Assessment or a Re-assessment for this encounter?: Initial Assessment Marital status: Single Maiden name: NA Is patient pregnant?: No Pregnancy Status: No Living Arrangements: Parent Can pt return to current living arrangement?: Yes Admission Status: Voluntary Is patient capable of signing voluntary admission?: Yes Referral Source: Self/Family/Friend Insurance type: Medicaid  Medical Screening Exam [facility] Walk-in ONLY) Medical Exam completed:  (Medical clearance obtained at [facility] ED)  Crisis Care Plan Living Arrangements: Parent Legal Guardian:  (NA) [facility] of [facility]iatrist[facility]  Name of Therapist: [facility]  Education Status Is patient currently in school?: No Current Grade: 1th  Highest grade of school patient has completed: 12TH Name of school: [employer] person: NA  Risk to self with the past 6 months Suicidal Ideation: Yes-Currently Present Has patient been a risk to self within the past 6 months prior to admission? : Yes Suicidal Intent: Yes-Currently Present Has patient had any suicidal intent within the past 6 months prior to admission? : No Is patient at risk for suicide?: Yes Suicidal Plan?: Yes-Currently Present Has patient had any suicidal plan within the past 6 months prior to admission? : No Specify Current Suicidal Plan: Voices will tell him how to kill himself, per patient Access to Means:  No Specify Access to Suicidal Means: None Reported What has been your use of drugs/alcohol within the last 12 months?: None Reported Previous Attempts/Gestures: No How many times?: 0 Other Self Harm Risks: None Reported Triggers for Past Attempts: None known Intentional Self Injurious Behavior: None Family Suicide History: No Recent stressful life  event(s): Other (Comment) (Confused, hearing voices) Persecutory voices/beliefs?: Yes Depression: Yes Depression Symptoms: Despondent, Isolating, Fatigue, Loss of interest in usual pleasures, Feeling worthless/self pity Substance abuse history and/or treatment for substance abuse?: No Suicide prevention information given to non-admitted patients: Yes  Risk to Others within the past 6 months Homicidal Ideation: No Does patient have any lifetime risk of violence toward others beyond the six months prior to admission? : No Thoughts of Harm to Others: No Current Homicidal Intent: No Current Homicidal Plan: No Access to Homicidal Means: No Identified Victim: None Reported History of harm to others?: No Assessment of Violence: None Noted Violent Behavior Description: None Reported Does patient have access to weapons?: No Criminal Charges Pending?: No Does patient have a court date: No Is patient on probation?: No  Psychosis Hallucinations: Visual, Auditory Delusions: None noted  Mental Status Report Appearance/Hygiene: Disheveled Eye Contact: Fair Motor Activity: Freedom of movement Speech: Soft, Slow, Tangential Level of Consciousness: Alert Mood: Depressed, Suspicious Affect: Blunted, Flat Anxiety Level: None Thought Processes: Flight of Ideas Judgement: Unimpaired Orientation: Place, Person, Situation, Time Obsessive Compulsive Thoughts/Behaviors: None  Cognitive Functioning Concentration: Decreased Memory: Recent Intact, Remote Intact IQ: Average Insight: Fair Impulse Control: Poor Appetite: Fair Weight Loss: 0 Weight Gain: 0 Sleep: Decreased Total Hours of Sleep: 3 Vegetative Symptoms: Staying in bed  ADLScreening [facility] Assessment Services) Patient's cognitive ability adequate to safely complete daily activities?: Yes Patient able to express need for assistance with ADLs?: Yes Independently performs ADLs?: Yes (appropriate for developmental age)  Prior  Inpatient Therapy Prior Inpatient Therapy: Yes Prior Therapy Dates: 2014 Prior Therapy Facilty/Provider(s): [facility] Reason for Treatment: Behavioral   Prior Outpatient Therapy Prior Outpatient Therapy: Yes Prior Therapy Dates: Ongoing  Prior Therapy Facilty/Provider([facility]): [facility] Reason for Treatment: Meds and Therapy Does patient have an ACCT team?: No Does patient have Intensive In-House Services?  : No Does patient have [facility] services? : No Does patient have P4CC services?: No  ADL Screening (condition at time of admission) Patient's cognitive ability adequate to safely complete daily activities?: Yes Is the patient deaf or have difficulty hearing?: No Does the patient have difficulty seeing, even when wearing glasses/contacts?: No Does the patient have difficulty concentrating, remembering, or making decisions?: Yes Patient able to express need for assistance with ADLs?: Yes Does the patient have difficulty dressing or bathing?: No Independently performs ADLs?: Yes (appropriate for developmental age) Does the patient have difficulty walking or climbing stairs?: No Weakness of Legs: None Weakness of Arms/Hands: None  Home Assistive Devices/Equipment Home Assistive Devices/Equipment: None    Abuse/Neglect Assessment (Assessment to be complete while patient is alone) Physical Abuse: Denies Verbal Abuse: Denies Sexual Abuse: Denies Exploitation of patient/patient's resources: Denies Self-Neglect: Denies Values / Beliefs Cultural Requests During Hospitalization: None Spiritual Requests During Hospitalization: None Consults Spiritual Care Consult Needed: No Social Work Consult Needed: No Merchant navy officer (For Healthcare) Does patient have an advance directive?: No Would patient like information on creating an advanced directive?: No - patient declined information    Additional Information 1:1 In Past 12 Months?: No CIRT Risk: No Elopement Risk: No Does patient  have medical clearance?: Yes     Disposition: Per Renata Caprice,  patient meets criteria for inpatient hospitalization.  Per [facility] patient accepted to [facility]  Writer informed the [facility] that the patient will be coming for medical clearance.  Disposition Initial Assessment Completed for this Encounter: Yes Disposition of Patient: Inpatient treatment program Type of inpatient treatment program: Adult (Per Renata Caprice, patient meets criteria for inpatient hospitaliza)  On Site Evaluation by:   Reviewed with Physician:    Phillip Heal LaVerne 09/09/2015 4:15 PM

## 2015-09-09 NOTE — Progress Notes (Signed)
Patient ID: Marvin Crawford, male   DOB: 11-Jul-1996, 20 y.o.   MRN: [id number]  Pt admitted from [facility] states his reason for coming is "to fight for my dad to come back home", pt reports that he has been off his meds and is unsure for how long, denies SI/HI/AVH, skin and contraband search done, skin intact, no contraband found, oriented pt to unit and rules, pt reports that his father is his legal guardian Oseias Horsey [phone], pt was cooperative during the admission process.

## 2015-09-09 NOTE — BH Assessment (Signed)
Per Renata Caprice, DNP - patient meets criteria for inpatient hospitalization.  Per [facility]ola) patient accepted to [facility] Bed 501-3.  Patient will be sent to [facility] for medical clearance.  Patient signed voluntary support paperwork.  Writer informed Merchandiser, retail at [employer].

## 2015-09-09 NOTE — ED Notes (Signed)
Pt went to behavior health today to talk to his councilor about his depression, [facility] sent patient here for med clearance but states there is a bed for him once he is cleared. Denies SI/HI/AVH.

## 2015-09-09 NOTE — BH Assessment (Signed)
Assessment Note  Marvin Crawford is an 20 y.o. male.   Diagnosis: Mood Disorder   Past Medical History:  Past Medical History  Diagnosis Date  . Environmental allergies   . ADD (attention deficit disorder)   . Depression   . Anxiety     History reviewed. No pertinent past surgical history.  Family History:  Family History  Problem Relation Age of Onset  . Hypertension Mother     Social History:  reports that he has never smoked. He does not have any smokeless tobacco history on file. He reports that he uses illicit drugs (Other-see comments). He reports that he does not drink alcohol.  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 1 - Age of First Use: 20 yrs old 1 - Amount (size/oz): one joint daily 1 - Frequency: daily 1 - Duration: since age 67 yrs 1 - Last Use / Amount: one blunt  CIWA: CIWA-Ar BP: (!) 130/105 mmHg Pulse Rate: 84 Nausea and Vomiting: no nausea and no vomiting Tactile Disturbances: none Tremor: no tremor Auditory Disturbances: not present Paroxysmal Sweats: no sweat visible Visual Disturbances: not present Anxiety: mildly anxious Headache, Fullness in Head: none present Agitation: normal activity Orientation and Clouding of Sensorium: oriented and can do serial additions CIWA-Ar Total: 1 COWS: Clinical Opiate Withdrawal Scale (COWS) Resting Pulse Rate: Pulse Rate 81-100 Sweating: No report of chills or flushing Restlessness: Able to sit still Pupil Size: Pupils pinned or normal size for room light Bone or Joint Aches: Not present Runny Nose or Tearing: Not present GI Upset: No GI  symptoms Tremor: No tremor Yawning: No yawning Anxiety or Irritability: Patient reports increasing irritability or anxiousness Gooseflesh Skin: Skin is smooth COWS Total Score: 2  Allergies: No Known Allergies  Home Medications:  No prescriptions prior to admission    OB/GYN Status:  No LMP for male patient.  General Assessment Data [facility] of Assessment: [facility] TTS Assessment: In system Is this a Tele or Face-to-Face Assessment?: Face-to-Face Is this an Initial Assessment or a Re-assessment for this encounter?: Initial Assessment Marital status: Single Maiden name: NA Is patient pregnant?: No Pregnancy Status: No Living Arrangements: Parent, Other relatives Can pt return to current living arrangement?: Yes Admission Status: Voluntary Is patient capable of signing voluntary admission?: Yes Referral Source: Self/Family/Friend Insurance type: Medicaid  Medical Screening Exam [facility] Walk-in ONLY) Medical Exam completed: Yes  Crisis Care Plan Living Arrangements: Parent, Other relatives Legal Guardian:  (NA) Name of Psychiatrist: [facility]  Name of Therapist: [facility]  Education Status Is patient currently in school?: No Current Grade: NA Highest grade of school patient has completed: 12TH Name of school: [employer] person: NA  Risk to self with the past 6 months Suicidal Ideation: Yes-Currently Present Has patient been a risk to self within the past 6 months prior to admission? : Yes Suicidal Intent: Yes-Currently Present Has patient had any suicidal intent within the past 6 months prior to admission? : Yes Is patient at risk for suicide?: Yes Suicidal Plan?: Yes-Currently Present Has patient had any suicidal plan within the past 6 months prior to admission? : Yes Specify Current Suicidal Plan: Patient  reprots that the voices will tell him how to kill himself Access to Means: No What has been your use of drugs/alcohol within the last 12  months?: NA Previous Attempts/Gestures: No How many times?: 0 Other Self Harm Risks: None Reported Triggers for Past Attempts: None known Intentional Self Injurious Behavior: None Family Suicide History: No Recent stressful life event(s): Other (Comment) (Hearing voices and hallucinations) Persecutory voices/beliefs?: Yes Depression: Yes Depression Symptoms: Despondent, Insomnia, Tearfulness, Isolating, Fatigue, Guilt, Loss of interest in usual pleasures, Feeling worthless/self pity Substance abuse history and/or treatment for substance abuse?: No Suicide prevention information given to non-admitted patients: Yes  Risk to Others within the past 6 months Homicidal Ideation: No Does patient have any lifetime risk of violence toward others beyond the six months prior to admission? : No Thoughts of Harm to Others: No Current Homicidal Intent: No Current Homicidal Plan: No Access to Homicidal Means: No Identified Victim: None Reported History of harm to others?: No Assessment of Violence: None Noted Violent Behavior Description: None Reported Does patient have access to weapons?: No Criminal Charges Pending?: No Does patient have a court date: No Is patient on probation?: No  Psychosis Hallucinations: Auditory, Visual Delusions: None noted  Mental Status Report Appearance/Hygiene: Unremarkable Eye Contact: Good Motor Activity: Freedom of movement Speech: Soft, Slow, Tangential Level of Consciousness: Alert Mood: Anxious, Despair Affect: Blunted, Sad, Flat Anxiety Level: None Thought Processes: Flight of Ideas Judgement: Unimpaired Orientation: Place, Person, Situation, Time Obsessive Compulsive Thoughts/Behaviors: None  Cognitive Functioning Concentration: Decreased Memory: Recent Intact, Remote Intact IQ: Average Insight: Fair Impulse Control: Poor Appetite: Poor Weight Loss: 5 Weight Gain: 0 Sleep: Decreased Total Hours of Sleep: 3 Vegetative Symptoms:  Decreased grooming  ADLScreening [facility] Assessment Services) Patient's cognitive ability adequate to safely complete daily activities?: Yes Patient able to express need for assistance with ADLs?: No Independently performs ADLs?: Yes (appropriate for developmental age)  Prior Inpatient Therapy Prior Inpatient Therapy: Yes Prior Therapy Dates: 2016; 2014 Prior Therapy Facilty/Provider(s): [facility] Reason for Treatment: Behavioral  Prior Outpatient Therapy Prior Outpatient Therapy: Yes Prior Therapy Dates: Ongoing  Prior Therapy Facilty/Provider([facility]): [facility] Reason for Treatment: Meds and Therapy Does patient have an ACCT team?: No Does patient have Intensive In-House Services?  : No Does patient have [facility] services? : No Does patient have P4CC services?: No  ADL Screening (condition at time of admission) Patient's cognitive ability adequate to safely complete daily activities?: Yes Is the patient deaf or have difficulty hearing?: No Does the patient have difficulty seeing, even when wearing glasses/contacts?: No Does the patient have difficulty concentrating, remembering, or making decisions?: Yes Patient able to express need for assistance with ADLs?: No Does the patient have difficulty dressing or bathing?: No Independently performs ADLs?: Yes (appropriate for developmental age) Does the patient have difficulty walking or climbing stairs?: No Weakness of Legs: None Weakness of Arms/Hands: None  Home Assistive Devices/Equipment Home Assistive Devices/Equipment: None  Therapy Consults (therapy consults require a physician order) PT Evaluation Needed: No OT Evalulation Needed: No SLP Evaluation Needed: No Abuse/Neglect Assessment (Assessment to be complete while patient is alone) Physical Abuse: Denies Verbal Abuse: Denies Sexual Abuse: Denies Exploitation of patient/patient's resources: Denies Self-Neglect: Denies Possible abuse reported to:: Other (Comment) (no abuse to  report) Values / Beliefs Cultural Requests During Hospitalization: None Spiritual Requests During Hospitalization: None Consults Spiritual Care Consult Needed: No Social Work Consult Needed: No Merchant navy officer (For Healthcare) Does patient have an advance directive?: No Would patient like information on creating  an advanced directive?: No - patient declined information Nutrition Screen- [facility] Patient's home diet: Regular Has the patient recently lost weight without trying?: No Has the patient been eating poorly because of a decreased appetite?: No Malnutrition Screening Tool Score: 0  Additional Information 1:1 In Past 12 Months?: No CIRT Risk: No Elopement Risk: No Does patient have medical clearance?: Yes     Disposition:  Disposition Initial Assessment Completed for this Encounter: Yes  On Site Evaluation by:   Reviewed with Physician:    Phillip Heal LaVerne 09/09/2015 2:41 PM

## 2015-09-09 NOTE — ED Provider Notes (Signed)
CSN: [id number]     Arrival date & time 09/09/15  1442 History   First MD Initiated Contact with Patient 09/09/15 1521     Chief Complaint  Patient presents with  . Medical Clearance     (Consider location/radiation/quality/duration/timing/severity/associated sxs/prior Treatment) HPI   Blood pressure 136/91, pulse 84, temperature 98.1 F (36.7 C), temperature source Oral, resp. rate 16, SpO2 100 %.  Marvin Crawford is a 20 y.o. male set from behavioral health for medical clearance, this patient has a bed at behavioral health. Marvin Crawford Mother via phone: He states that she took the patient to behavioral health secondary to 2 weeks of increasing lethargy, confusion. She's had multiple medication alterations, initially he was on 8 psychiatric medications and he's been progressively weaning down to the point where right now he is only on Abilify. There may have been a questionable dystonic reaction. Patient has no complaints. He states he just wants to talk to his mother. He denies suicidal ideation, homicidal ideation, auditory or visual hallucinations, pain.    Past Medical History  Diagnosis Date  . Environmental allergies   . ADD (attention deficit disorder)   . Depression   . Anxiety    History reviewed. No pertinent past surgical history. Family History  Problem Relation Age of Onset  . Hypertension Mother    Social History  Substance Use Topics  . Smoking status: Never Smoker   . Smokeless tobacco: None  . Alcohol Use: No    Review of Systems  10 systems reviewed and found to be negative, except as noted in the HPI.  Allergies  Review of patient's allergies indicates no known allergies.  Home Medications   Prior to Admission medications   Medication Sig Start Date End Date Taking? Authorizing Provider  ARIPiprazole (ABILIFY) 10 MG tablet Take 10 mg by mouth 2 (two) times daily. 08/26/15  Yes Historical Provider, MD  benztropine (COGENTIN) 0.5 MG tablet Take 1 tablet  (0.5 mg total) by mouth at bedtime. For prevention of drug induced tremors 08/13/15  Yes Sanjuana Kava, NP  busPIRone (BUSPAR) 5 MG tablet Take 1 tablet (5 mg total) by mouth 3 (three) times daily. For anxiety 08/13/15  Yes Sanjuana Kava, NP  famotidine (PEPCID) 20 MG tablet Take 1 tablet (20 mg total) by mouth 2 (two) times daily. For acid reflux 08/13/15  Yes Sanjuana Kava, NP  hydrOXYzine (ATARAX/VISTARIL) 25 MG tablet Take 1 tablet (25 mg total) by mouth every 6 (six) hours as needed for anxiety. 08/13/15  Yes Sanjuana Kava, NP  mirtazapine (REMERON) 45 MG tablet Take 1 tablet (45 mg total) by mouth at bedtime. For depression/insomnia 08/13/15  Yes Sanjuana Kava, NP  nicotine (NICODERM CQ - DOSED IN MG/24 HOURS) 21 mg/24hr patch Place 1 patch (21 mg total) onto the skin daily. For smoking cessation 08/13/15  Yes Sanjuana Kava, NP  traZODone (DESYREL) 50 MG tablet Take 1 tablet (50 mg total) by mouth at bedtime as needed for sleep. 08/13/15  Yes Sanjuana Kava, NP  ARIPiprazole (ABILIFY) 5 MG tablet Take 1 tablet (5 mg) daily & 3 tablets (15 mg) at bedtime: For mood control Patient not taking: Reported on 09/09/2015 08/13/15   Sanjuana Kava, NP   BP 136/91 mmHg  Pulse 84  Temp(Src) 98.1 F (36.7 C) (Oral)  Resp 16  SpO2 100% Physical Exam  Constitutional: He is oriented to person, place, and time. He appears well-developed and well-nourished. No distress.  HENT:  Head: Normocephalic and atraumatic.  Mouth/Throat: Oropharynx is clear and moist.  Eyes: Conjunctivae and EOM are normal. Pupils are equal, round, and reactive to light.  Neck: Normal range of motion.  Cardiovascular: Normal rate, regular rhythm and intact distal pulses.   Pulmonary/Chest: Effort normal and breath sounds normal. No stridor. No respiratory distress. He has no wheezes. He has no rales. He exhibits no tenderness.  Abdominal: Soft. Bowel sounds are normal. He exhibits no distension and no mass. There is no tenderness. There is no  rebound and no guarding.  Musculoskeletal: Normal range of motion.  Neurological: He is alert and oriented to person, place, and time.  Follows commands, Clear, goal oriented speech, Strength is 5 out of 5x4 extremities, patient ambulates with a coordinated in nonantalgic gait. Sensation is grossly intact.   Psychiatric: His affect is blunt. His speech is delayed. His speech is not slurred. He is slowed. Thought content is not paranoid. He expresses no homicidal and no suicidal ideation. He is communicative.  Slowed response. Oriented 3, cooperative  Nursing note and vitals reviewed.   ED Course  Procedures (including critical care time)  3:30 PM went to evaluate this patient in room triage 8, he was not in the room, discussed with staff and they say that this patient went into the lobby to get a candy bar. I've asked him to try to locate the patient in the waiting room, they could not locate him and there was no response to calling his name in the waiting room.  3:45 PM spoke with Ava at behavioral health who states that patient's mother just called her. A church member saw this patient wandering around the parking lot confused. According to Ava: This patient has baseline learning disability but he is behaving more slowly than normally. Was here voluntarily.  4:00 PM asked charge nurse to send security to look for this patient in the parking lot  Labs Review Labs Reviewed  COMPREHENSIVE METABOLIC PANEL - Abnormal; Notable for the following:    Glucose, Bld 105 (*)    Calcium 10.6 (*)    Albumin 5.2 (*)    All other components within normal limits  ACETAMINOPHEN LEVEL - Abnormal; Notable for the following:    Acetaminophen (Tylenol), Serum <10 (*)    All other components within normal limits  ETHANOL  SALICYLATE LEVEL  CBC  URINE RAPID DRUG SCREEN, HOSP PERFORMED    Imaging Review No results found. I have personally reviewed and evaluated these images and lab results as part of  my medical decision-making.   EKG Interpretation None      MDM   Final diagnoses:  Psychomotor retardation    Filed Vitals:   09/09/15 1451  BP: 136/91  Pulse: 84  Temp: 98.1 F (36.7 C)  TempSrc: Oral  Resp: 16  SpO2: 100%    Marvin Crawford is 20 y.o. male presenting for medical confusion secondary to slowed responses. My neuro exam is nonfocal, he is oriented 3, no florid psychosis.  Patient is medically cleared for psychiatric evaluation will be transferred to the psych ED. TTS consulted, home meds and psych standard holding orders placed.        Wynetta Emery, PA-C 09/09/15 1725  Gerhard Munch, MD 09/13/15 [phone]

## 2015-09-09 NOTE — Progress Notes (Addendum)
pcp listed as per medicaid response hx is [facility] [address] [place], [place] [address] [phone] Cm called this pcp and pt is not and active pt and has not been to facility CM provided pt with a list of [place] medicaid providers to assist with changing providers with assist of DSS case worker Provided Local DSS contact information   Entered in d/c instructions  Please contact the [employer] DSS to get assist with changing you pcp on your medicaid card  call or go to DSS to get this dr changed to one that is accepting new patients or one of your preference  This pcp or doctor on your medicaid card [facility] [address] [place], [place] [address] [phone] Provides only pediatric services ages 0-18 yr

## 2015-09-09 NOTE — Progress Notes (Signed)
CM went to speak with pt about changing pcp Pt returned from bathroom with sitter. Pt became emotional (but no tears) as CM asked him questions and asked to call his mother.  Pt informed CM his mother sent him here. Pt calm by time CM left room and eating sandwich Triage RN updated.

## 2015-09-10 ENCOUNTER — Encounter (HOSPITAL_COMMUNITY): Payer: Self-pay | Admitting: Psychiatry

## 2015-09-10 DIAGNOSIS — F1220 Cannabis dependence, uncomplicated: Secondary | ICD-10-CM

## 2015-09-10 DIAGNOSIS — R4183 Borderline intellectual functioning: Secondary | ICD-10-CM

## 2015-09-10 LAB — URINALYSIS W MICROSCOPIC (NOT AT ARMC)
BACTERIA UA: NONE SEEN
Bilirubin Urine: NEGATIVE
GLUCOSE, UA: NEGATIVE mg/dL
HGB URINE DIPSTICK: NEGATIVE
KETONES UR: NEGATIVE mg/dL
LEUKOCYTES UA: NEGATIVE
NITRITE: NEGATIVE
PROTEIN: NEGATIVE mg/dL
RBC / HPF: NONE SEEN RBC/hpf (ref 0–5)
Specific Gravity, Urine: 1.025 (ref 1.005–1.030)
pH: 7 (ref 5.0–8.0)

## 2015-09-10 MED ORDER — LORAZEPAM 1 MG PO TABS: 1.0000 mg | ORAL_TABLET | Freq: Four times a day (QID) | ORAL | Status: DC | PRN

## 2015-09-10 MED ORDER — RISPERIDONE 1 MG PO TBDP: 1.0000 mg | ORAL_TABLET | Freq: Three times a day (TID) | ORAL | Status: DC | PRN

## 2015-09-10 MED ORDER — DIPHENHYDRAMINE HCL 25 MG PO CAPS: 25.0000 mg | ORAL_CAPSULE | Freq: Three times a day (TID) | ORAL | Status: DC | PRN

## 2015-09-10 MED ORDER — ESCITALOPRAM OXALATE 10 MG PO TABS
10.0000 mg | ORAL_TABLET | Freq: Every day | ORAL | Status: DC
  Administered 2015-09-10 – 2015-09-13 (×4): 10 mg via ORAL
  Filled 2015-09-10 (×7): qty 1

## 2015-09-10 MED ORDER — LORAZEPAM 2 MG/ML IJ SOLN: 1.0000 mg | Freq: Four times a day (QID) | INTRAMUSCULAR | Status: DC | PRN

## 2015-09-10 MED ORDER — TRAZODONE HCL 100 MG PO TABS
100.0000 mg | ORAL_TABLET | Freq: Every evening | ORAL | Status: DC | PRN
  Administered 2015-09-10 – 2015-09-12 (×3): 100 mg via ORAL
  Filled 2015-09-10 (×3): qty 1

## 2015-09-10 NOTE — Tx Team (Signed)
Initial Interdisciplinary Treatment Plan   PATIENT STRESSORS: Health problems Marital or family conflict Medication change or noncompliance   PATIENT STRENGTHS: Barrister's clerk for treatment/growth Physical Health Supportive family/friends   PROBLEM LIST: Problem List/Patient Goals Date to be addressed Date deferred Reason deferred Estimated date of resolution  Psychosis 09/10/15     Depression 09/10/15     Medication Noncompliance 09/10/15                                          DISCHARGE CRITERIA:  Adequate post-discharge living arrangements Motivation to continue treatment in a less acute level of care Verbal commitment to aftercare and medication compliance  PRELIMINARY DISCHARGE PLAN: Attend aftercare/continuing care group Attend PHP/IOP Outpatient therapy Return to previous living arrangement  PATIENT/FAMIILY INVOLVEMENT: This treatment plan has been presented to and reviewed with the patient, Marvin Crawford.  The patient and family have been given the opportunity to ask questions and make suggestions.  Mickie Bail 09/10/2015, 12:20 PM
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
## 2015-09-10 NOTE — Progress Notes (Signed)
Did not attend group 

## 2015-09-10 NOTE — Progress Notes (Signed)
   D: Writer introduced herself to the pt. Asked pt if he planned to attend karaoke pt stated, "No, I want my dad to be here". Pt continued to pace the hall, use the telephone or stand at the nurses desk.  When asked if he was hearing voices or seeing things that aren't there pt stated, "no, but my dad does".  Later pt asked the writer to tell him all of his medications. Pt wrote several meds down. Pt has no questions or concerns.    A:  Support and encouragement was offered. 15 min checks continued for safety.  R: Pt remains safe.

## 2015-09-10 NOTE — [facility] Suicide Risk Assessment (Signed)
[facility] INPATIENT:  Family/Significant Other Suicide Prevention Education  Suicide Prevention Education:  Education Completed; Marylene Land (mom/guardian) [phone], has been identified by the patient as the family member/significant other with whom the patient will be residing, and identified as the person(s) who will aid the patient in the event of a mental health crisis (suicidal ideations/suicide attempt).  With written consent from the patient, the family member/significant other has been provided the following suicide prevention education, prior to the and/or following the discharge of the patient.  The suicide prevention education provided includes the following:  Suicide risk factors  Suicide prevention and interventions  National Suicide Hotline telephone number  [facility] assessment telephone number  [facility] Emergency Assistance 911  [facility] and/or Residential Mobile Crisis Unit telephone number  Request made of family/significant other to:  Remove weapons (e.g., guns, rifles, knives), all items previously/currently identified as safety concern.    Remove drugs/medications (over-the-counter, prescriptions, illicit drugs), all items previously/currently identified as a safety concern.  The family member/significant other verbalizes understanding of the suicide prevention education information provided.  The family member/significant other agrees to remove the items of safety concern listed above.  Mom is interested in possible day program and one-on-one therapy for pt.  Jonathon Jordan 09/10/2015, 1:01 PM

## 2015-09-10 NOTE — [facility] Group Notes (Signed)
[facility] LCSW Group Therapy   09/10/2015 1:53 PM  Type of Therapy: Group Therapy  Participation Level: Was present for the beginning of group. Left after a few minutes and did not return.  Summary of Progress/Problems: Chaplain was here to lead a group on themes of hope and/or courage.   Marvin Crawford 09/10/2015 1:53 PM

## 2015-09-10 NOTE — [facility] Suicide Risk Assessment (Signed)
[facility] Admission Suicide Risk Assessment   Nursing information obtained from:    Demographic factors:    Current Mental Status:    Loss Factors:    Historical Factors:    Risk Reduction Factors:     Total Time spent with patient: 30 minutes Principal Problem: MDD (major depressive disorder), recurrent, severe, with psychosis (HCC) Diagnosis:   Patient Active Problem List   Diagnosis Date Noted  . 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 Stuttering [F80.81] 08/09/2015  . Cannabis use disorder, moderate, dependence (HCC) [F12.20] 08/09/2015  . MDD (major depressive disorder), recurrent, severe, with psychosis (HCC) [F33.3] 02/16/2013   Subjective Data: Please see H&P.   Continued Clinical Symptoms:  Alcohol Use Disorder Identification Test Final Score (AUDIT): 0 The "Alcohol Use Disorders Identification Test", Guidelines for Use in Primary Care, Second Edition.  World Science writer [facility]). Score between 0-7:  no or low risk or alcohol related problems. Score between 8-15:  moderate risk of alcohol related problems. Score between 16-19:  high risk of alcohol related problems. Score 20 or above:  warrants further diagnostic evaluation for alcohol dependence and treatment.   CLINICAL FACTORS:   Severe Anxiety and/or Agitation Depression:   Impulsivity Insomnia Alcohol/Substance Abuse/Dependencies Previous Psychiatric Diagnoses and Treatments    Psychiatric Specialty Exam: Review of Systems  Psychiatric/Behavioral: Positive for depression and substance abuse. The patient is nervous/anxious and has insomnia.   All other systems reviewed and are negative.   Blood pressure 127/104, pulse 98, temperature 98.3 F (36.8 C), temperature source Oral, resp. rate 18, height  (1.803 m), weight 91.173 kg (201 lb).Body mass index is 28.05 kg/(m^2).                    Please see H&P.                                     COGNITIVE  FEATURES THAT CONTRIBUTE TO RISK:  Closed-mindedness, Polarized thinking and Thought constriction (tunnel vision)    SUICIDE RISK:   Mild:  Suicidal ideation of limited frequency, intensity, duration, and specificity.  There are no identifiable plans, no associated intent, mild dysphoria and related symptoms, good self-control (both objective and subjective assessment), few other risk factors, and identifiable protective factors, including available and accessible social support.  PLAN OF CARE: Please see H&P.   I certify that inpatient services furnished can reasonably be expected to improve the patient's condition.   Robert Sperl, MD 09/10/2015, 10:53 AM

## 2015-09-10 NOTE — [facility] Group Notes (Signed)
[facility] LCSW Aftercare Discharge Planning Group Note   09/10/2015 11:09 AM  Participation Quality:  Minimal  Mood/Affect:  Flat  Depression Rating:    Anxiety Rating:    Thoughts of Suicide:  No Will you contract for safety?   NA  Current AVH:  denies  Plan for Discharge/Comments:  In and out of group several times.  States he has not been following up with outpt provider and has not been taking meds.  Willing to agree that could be related to his readmission.  When asked why he is here, "My mom and I are trying to bring my dad back home from [facility]." Disorganized with repetitive thoughts.  Transportation Means:   Supports:  [name]

## 2015-09-10 NOTE — Progress Notes (Signed)
DAR NOTE: Patient presents with anxious affect and depressed mood.  Denies pain, auditory and visual hallucinations.  Rates depression at 5, hopelessness at 7, and anxiety at 8.  Maintained on routine safety checks.  Medications given as prescribed.  Support and encouragement offered as needed.  Attended group and participated.     Offered no complaint.
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
## 2015-09-10 NOTE — Tx Team (Signed)
Interdisciplinary Treatment Plan Update (Adult)  Date:  09/10/2015 Time Reviewed:  10:59 AM  Progress in Treatment: Attending groups: Yes. Participating in groups: Yes. Taking medication as prescribed:  Yes. Tolerating medication:  Yes. Family/Significant othe contact made:  Yes, individual(s) contacted:  [name] (Mom/Guardian) [phone] Patient understands diagnosis:  No, limited insight. Discussing patient identified problems/goals with staff:  Yes, see initial care plan. Medical problems stabilized or resolved:  Yes Denies suicidal/homicidal ideation: Yes. Issues/concerns per patient self-inventory: No. Other:  New problem(s) identified:   Discharge Plan or Barriers: See below   Reason for Continuation of Hospitalization: Depression Hallucinations Medication stabilization Suicidal ideation  Comments: Marvin Crawford is a 20 year old AA male , single, unemployed , has a hx of MDD, Borderline intellectual functioning, speech impairment as well as cannabis abuse , who on initial assessment endorsed suicidal ideation with no plan. Per notes in epic the patient called 911 asking to be taken to jail for being mean to his cousin. Marvin Crawford reported increased depression due to financial problems in his family. His mother reported patient has not been taking any medications for at least six months but has been smoking marijuana daily. He has a history of one psychiatric admission at 17 with Dr. Creig Hines on the adolescent unit. From reviewing this assessment patient is documented to have experienced suicidal thoughts at that time along with auditory hallucinations of the devil speaking to him. The patient has intellectual disability, and multiple developmental delays, including a phonological disorder. Abilify, Congentin, Lexapro trial  Estimated length of stay: 4-5 days  New goal(s):  Review of initial/current patient goals per problem list:  1. Goal(s): Patient will participate in  aftercare plan  Met:Yes   Target date: at discharge  As evidenced by: Patient will participate within aftercare plan AEB aftercare provider and housing plan at discharge being identified. 09/10/15: Pt will return home with mother and follow-up outpt with [facility].  2. Goal (s): Patient will exhibit decreased depressive symptoms and suicidal ideations.  Met: No  Target date: at discharge  As evidenced by: Patient will utilize self rating of depression at 3 or below and demonstrate decreased signs of depression or be deemed stable for discharge by MD. 09/10/15: Pt endorses SI and depressive symptoms.   4. Goal(s): Patient will demonstrate decreased signs of psychosis.  Met: No  Target date:at discharge  As evidenced by: Patient will demonstrate decreased signs of psychosis as evidenced by a reduction in AVH, paranoia, and/or delusions.   09/10/15: Pt appears to be attending to internal stimuli as evidenced by thought blocking and inability to concentrate.     Attendees: Patient:  09/10/2015 10:59 AM  Family:   09/10/2015 10:59 AM  Physician:  Dr. Ursula Alert, MD 09/10/2015 10:59 AM  Nursing:  Wynonia Hazard, RN 09/10/2015 10:59 AM  Case Manager:  Roque Lias, LCSW 09/10/2015 10:59 AM  Counselor:  Matthew Saras, MSW Intern 09/10/2015 10:59 AM  Other:   09/10/2015 10:59 AM  Other:   09/10/2015 10:59 AM  Other:   09/10/2015 10:59 AM  Other:  09/10/2015 10:59 AM  Other:    Other:    Other:    Other:    Other:    Other:      Scribe for Treatment Team:   Georga Kaufmann, MSW Intern 09/10/2015 10:59 AM

## 2015-09-10 NOTE — Progress Notes (Signed)
Recreation Therapy Notes  02.03.2017 Recreation therapy order acknowledged. Patient on unit, but sleeping at this time. LRT will return to work with patient during admission. Marykay Lex Sheryll Dymek, LRT/CTRS   Jearl Klinefelter 09/10/2015 3:40 PM

## 2015-09-10 NOTE — Progress Notes (Signed)
Vince states today was "good" because he is eating better. His goal tomorrow is to "eat more food and tell the truth". He did not attend group even after asking what time did group start. He denies SI/HI/AAVH. Contracts for safety. When I administered his pm medication he attempted to place it in his pocket after staring at it for a prolonged time. When I asked him what was in his pocket he pulled out a pencil. There was a moment of silence and then he pulled the pill out and placed it in his mouth and swallowed. I praised him for taking his medication. Encouragement and support given. Medication administered as prescribed. Continue to monitor Q 15 minutes for patient safety and medication effectiveness.

## 2015-09-11 DIAGNOSIS — F333 Major depressive disorder, recurrent, severe with psychotic symptoms: Principal | ICD-10-CM

## 2015-09-11 DIAGNOSIS — R45851 Suicidal ideations: Secondary | ICD-10-CM

## 2015-09-11 LAB — LIPID PANEL
CHOL/HDL RATIO: 3.3 ratio
Cholesterol: 131 mg/dL (ref 0–200)
HDL: 40 mg/dL — AB (ref >40)
LDL Cholesterol: 79 mg/dL (ref 0–99)
Triglycerides: 58 mg/dL (ref <150)
VLDL: 12 mg/dL (ref 0–40)

## 2015-09-11 LAB — TSH: TSH: 1.175 u[IU]/mL (ref 0.350–4.500)

## 2015-09-11 NOTE — Progress Notes (Signed)
[facility] MD Progress Note  09/11/2015 10:36 AM Marvin Crawford  MRN:  [id number] Subjective:  I am hearing voices  Objective; patient is 20 year old African-American single and unemployed man who was admitted due to decompensation into his illness.  He admitted not taking his medication resulting in hallucination, confusion, paranoia and responding to internal stimuli.  [facility]  He was restarted on Abilify and Lexapro.  Now he is taking the medication but he continued to endorse paranoia, hallucination and having thought blocking.  He was noticed responding to internal stimuli.  He has limited social interaction.  He appears preoccupied to himself.  He endorsed poor sleep racing thought and paranoia but he is not aggressive.  He is taking the medication.  He has no tremors or shakes.  He still feel depressed.  Principal Problem: MDD (major depressive disorder), recurrent, severe, with psychosis (HCC) Diagnosis:   Patient Active Problem List   Diagnosis Date Noted  . 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 Stuttering [F80.81] 08/09/2015  . Cannabis use disorder, moderate, dependence (HCC) [F12.20] 08/09/2015  . MDD (major depressive disorder), recurrent, severe, with psychosis (HCC) [F33.3] 02/16/2013   Total Time spent with patient: 30 minutes  Past Psychiatric History: See history and physical  Past Medical History:  Past Medical History  Diagnosis Date  . Environmental allergies   . ADD (attention deficit disorder)   . Depression   . Anxiety    History reviewed. No pertinent past surgical history. Family History:  Family History  Problem Relation Age of Onset  . Hypertension Mother   . Hypertension Father   . Mental illness Neg Hx    Family Psychiatric  History: See history and physical Social History:  History  Alcohol Use No     History  Drug Use  . Yes  . Special: Other-see comments, Marijuana     Comment: oil smoked in pipe calls "DAB", friends buy for him    Social History   Social History  . Marital Status: Single    Spouse Name: N/A  . Number of Children: N/A  . Years of Education: N/A   Social History Main Topics  . Smoking status: Never Smoker   . Smokeless tobacco: None  . Alcohol Use: No  . Drug Use: Yes    Special: Other-see comments, Marijuana     Comment: oil smoked in pipe calls "DAB", friends buy for him  . Sexual Activity: Yes    Birth Control/ Protection: Condom   Other Topics Concern  . None   Social History Narrative   Additional Social History:                         Sleep: Poor  Appetite:  Fair  Current Medications: Current Facility-Administered Medications  Medication Dose Route Frequency Provider Last Rate Last Dose  . acetaminophen (TYLENOL) tablet 650 mg  650 mg Oral Q6H PRN Worthy Flank, NP      . alum & mag hydroxide-simeth (MAALOX/MYLANTA) 200-200-20 MG/5ML suspension 30 mL  30 mL Oral Q4H PRN Worthy Flank, NP      . ARIPiprazole (ABILIFY) tablet 10 mg  10 mg Oral BID Worthy Flank, NP   10 mg at 09/11/15 0836  . benztropine (COGENTIN) tablet 0.5 mg  0.5 mg Oral QHS Worthy Flank, NP   0.5 mg at 09/10/15 2133  . risperiDONE (RISPERDAL M-TABS) disintegrating tablet 1 mg  1  mg Oral TID PRN Jomarie Longs, MD       And  . diphenhydrAMINE (BENADRYL) capsule 25 mg  25 mg Oral Q8H PRN Saramma Eappen, MD      . escitalopram (LEXAPRO) tablet 10 mg  10 mg Oral Daily Jomarie Longs, MD   10 mg at 09/11/15 0836  . famotidine (PEPCID) tablet 20 mg  20 mg Oral BID Worthy Flank, NP   20 mg at 09/11/15 0836  . hydrOXYzine (ATARAX/VISTARIL) tablet 25 mg  25 mg Oral Q6H PRN Worthy Flank, NP      . LORazepam (ATIVAN) tablet 1 mg  1 mg Oral Q6H PRN Jomarie Longs, MD       Or  . LORazepam (ATIVAN) injection 1 mg  1 mg Intramuscular Q6H PRN Jomarie Longs, MD      . magnesium hydroxide (MILK OF MAGNESIA) suspension 30 mL  30 mL  Oral Daily PRN Worthy Flank, NP      . traZODone (DESYREL) tablet 100 mg  100 mg Oral QHS PRN Jomarie Longs, MD   100 mg at 09/10/15 2132    Lab Results:  Results for orders placed or performed during the hospital encounter of 09/09/15 (from the past 48 hour(s))  Urinalysis with microscopic (not at [facility])     Status: Abnormal   Collection Time: 09/10/15  6:31 PM  Result Value Ref Range   Color, Urine YELLOW YELLOW   APPearance CLEAR CLEAR   Specific Gravity, Urine 1.025 1.005 - 1.030   pH 7.0 5.0 - 8.0   Glucose, UA NEGATIVE NEGATIVE mg/dL   Hgb urine dipstick NEGATIVE NEGATIVE   Bilirubin Urine NEGATIVE NEGATIVE   Ketones, ur NEGATIVE NEGATIVE mg/dL   Protein, ur NEGATIVE NEGATIVE mg/dL   Nitrite NEGATIVE NEGATIVE   Leukocytes, UA NEGATIVE NEGATIVE   WBC, UA 0-5 0 - 5 WBC/hpf   RBC / HPF NONE SEEN 0 - 5 RBC/hpf   Bacteria, UA NONE SEEN NONE SEEN   Squamous Epithelial / LPF 0-5 (A) NONE SEEN   Crystals CA OXALATE CRYSTALS (A) NEGATIVE   Urine-Other MUCOUS PRESENT     Comment: Performed at [facility]  TSH     Status: None   Collection Time: 09/11/15  6:30 AM  Result Value Ref Range   TSH 1.175 0.350 - 4.500 uIU/mL    Comment: Performed at [facility]    Physical Findings: AIMS: Facial and Oral Movements Muscles of Facial Expression: None, normal Lips and Perioral Area: None, normal Jaw: None, normal Tongue: None, normal,Extremity Movements Upper (arms, wrists, hands, fingers): None, normal Lower (legs, knees, ankles, toes): None, normal, Trunk Movements Neck, shoulders, hips: None, normal, Overall Severity Severity of abnormal movements (highest score from questions above): None, normal Incapacitation due to abnormal movements: None, normal Patient's awareness of abnormal movements (rate only patient's report): No Awareness, Dental Status Current problems with teeth and/or dentures?: No Does patient usually wear dentures?: No   CIWA:    COWS:     Musculoskeletal: Strength & Muscle Tone: within normal limits Gait & Station: normal Patient leans: N/A  Psychiatric Specialty Exam: Review of Systems  Cardiovascular: Negative for chest pain.  Musculoskeletal: Negative.   Skin: Negative for itching and rash.  Neurological: Negative for dizziness, tingling and headaches.  Psychiatric/Behavioral: Positive for depression, suicidal ideas and hallucinations. The patient is nervous/anxious and has insomnia.     Blood pressure 107/66, pulse 143, temperature 97.9 F (36.6 C), temperature source Oral,  resp. rate 18, height 5\' 11"  (1.803 m), weight 91.173 kg (201 lb).Body mass index is 28.05 kg/(m^2).  General Appearance: Fairly Groomed and Guarded  Patent attorney::  Minimal  Speech:  Slow  Volume:  Decreased  Mood:  Anxious, Depressed and Hopeless  Affect:  Depressed, Flat, Inappropriate and Restricted  Thought Process:  Circumstantial, Disorganized, Irrelevant, Logical, Loose and Tangential  Orientation:  Full (Time, Place, and Person)  Thought Content:  Hallucinations: Auditory and Paranoid Ideation  Suicidal Thoughts:  Yes.  without intent/plan  Homicidal Thoughts:  No  Memory:  Immediate;   Poor Recent;   Fair Remote;   Poor  Judgement:  Impaired  Insight:  Lacking  Psychomotor Activity:  Decreased  Concentration:  Poor  Recall:  Poor  Fund of Knowledge:Fair  Language: Fair  Akathisia:  No  Handed:  Right  AIMS (if indicated):     Assets:  Housing Social Support  ADL's:  Intact  Cognition: Impaired,  Mild  Sleep:  Number of Hours: 6.75   Treatment Plan Summary: Daily contact with patient to assess and evaluate symptoms and progress in treatment and Medication management   Will continue Abilify 10 mg po BID for psychosis/augmenting the effect of antidepressant. Will continue Lexapro 10 mg po daily for affective sx. Continue Trazodone 100 mg po qhs prn for sleep. Will make available PRN medications as  per agitation protocol. Will continue to monitor vitals ,medication compliance and treatment side effects while patient is here.  Will monitor for medical issues as well as call consult as needed.  Reviewed labs ,UDS- negative,BAL<5,CBC-wnl,CMP - wnl - TSH, lipid panel and prolactin still in process.   CSW will start working on disposition.  Patient to participate in therapeutic milieu .   Ridge Lafond T., MD 09/11/2015, 10:36 AM

## 2015-09-11 NOTE — Plan of Care (Signed)
Problem: Ineffective individual coping Goal: STG: Patient will remain free from self harm Outcome: Progressing Patient has not engaged in self harm. Denies SI.  Problem: Alteration in thought process Goal: STG-Patient is able to sleep at least 6 hours per night Outcome: Progressing Patient sleeping 6.75 hours nightly.

## 2015-09-11 NOTE — Progress Notes (Signed)
D: Pt denies SI/HI/AVH. Pt is pleasant and cooperative. Pt appears to be responding even though he denies, but is appropriate at this time. Pt suspicious and guarded , but will talk .   A: Pt was offered support and encouragement. Pt was given scheduled medications. Pt was encourage to attend groups. Q 15 minute checks were done for safety.   R:Pt attends groups and interacts well with peers and staff. Pt is taking medication. Pt has no complaints at this time .Pt receptive to treatment and safety maintained on unit.

## 2015-09-11 NOTE — [facility] Group Notes (Signed)
[facility] Group Notes:  (Clinical Social Work)  09/11/2015  11:15-12:00PM  Summary of Progress/Problems:   Today's process group involved patients discussing their feelings related to being hospitalized, as well as how they can use their present feelings to make a plan for how to stay out of the hospital.  There was an emphasis on setting boundaries, making "I" statements, going to AA/support groups/therapy/doctor's appointments. The patient expressed his primary feeling about being hospitalized is "good, no, nothing."  That is all he said in group, and he left the room several times, returning each time but never participating, although he was alert and leaning forward as though listening.  Type of Therapy:  Group Therapy - Process  Participation Level:  Minimal  Participation Quality:  Attentive  Affect:  Anxious  Cognitive:  Lacking  Insight:  Limited  Engagement in Therapy:  Poor  Modes of Intervention:  Exploration, Discussion  Ambrose Mantle, LCSW 09/11/2015, 12:50 PM

## 2015-09-11 NOTE — Progress Notes (Signed)
Patient up and visible in the milieu. On observation, patient internally preoccupied. Restless, pacing. Turned in self inventory sheet however asked for it back 4 times to add information, primarily physical complaints. Patient then asking for a new self inventory indicating he "messed this one up." Patient had denied pain, physical issues to this writer prior to this. Affect flat, speech slow. Minimal information offered. Patient offered emotional support and reassurance. Medicated per orders. No prn's requested or required. Patient denies SI/HI and remains safe on level III obs.  Lawrence Marseilles

## 2015-09-12 LAB — URINE CULTURE
Culture: NO GROWTH
Special Requests: NORMAL

## 2015-09-12 NOTE — Progress Notes (Signed)
[facility] MD Progress Note  09/12/2015 12:36 PM GLOYD HAPP  MRN:  [id number] Subjective:  I'm sleeping better.  I like medication.    Objective; Patient is 20 year old African-American single and unemployed man who was admitted due to decompensation into his illness.  He admitted not taking his medication resulting in hallucination, confusion, paranoia and responding to internal stimuli.  [facility]   Patient seen chart reviewed.  He is feeling much better.  He is taking his medication without any issues.  He is still endorse hallucination paranoia but denies any suicidal thoughts.  He is still has thought blocking and some time appears preoccupied to himself and noticed talking to himself.  His sleep is improved.  He is taking Abilify and Lexapro.    Principal Problem: MDD (major depressive disorder), recurrent, severe, with psychosis (HCC) Diagnosis:   Patient Active Problem List   Diagnosis Date Noted  . 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 Stuttering [F80.81] 08/09/2015  . Cannabis use disorder, moderate, dependence (HCC) [F12.20] 08/09/2015  . MDD (major depressive disorder), recurrent, severe, with psychosis (HCC) [F33.3] 02/16/2013   Total Time spent with patient: 20 minutes  Past Psychiatric History: See history and physical  Past Medical History:  Past Medical History  Diagnosis Date  . Environmental allergies   . ADD (attention deficit disorder)   . Depression   . Anxiety    History reviewed. No pertinent past surgical history. Family History:  Family History  Problem Relation Age of Onset  . Hypertension Mother   . Hypertension Father   . Mental illness Neg Hx    Family Psychiatric  History: See history and physical Social History:  History  Alcohol Use No     History  Drug Use  . Yes  . Special: Other-see comments, Marijuana    Comment: oil smoked in pipe calls "DAB", friends buy for him     Social History   Social History  . Marital Status: Single    Spouse Name: N/A  . Number of Children: N/A  . Years of Education: N/A   Social History Main Topics  . Smoking status: Never Smoker   . Smokeless tobacco: None  . Alcohol Use: No  . Drug Use: Yes    Special: Other-see comments, Marijuana     Comment: oil smoked in pipe calls "DAB", friends buy for him  . Sexual Activity: Yes    Birth Control/ Protection: Condom   Other Topics Concern  . None   Social History Narrative   Additional Social History:  Sleep: Fair  Appetite:  Fair  Current Medications: Current Facility-Administered Medications  Medication Dose Route Frequency Provider Last Rate Last Dose  . acetaminophen (TYLENOL) tablet 650 mg  650 mg Oral Q6H PRN Worthy Flank, NP      . alum & mag hydroxide-simeth (MAALOX/MYLANTA) 200-200-20 MG/5ML suspension 30 mL  30 mL Oral Q4H PRN Worthy Flank, NP      . ARIPiprazole (ABILIFY) tablet 10 mg  10 mg Oral BID Worthy Flank, NP   10 mg at 09/12/15 0806  . benztropine (COGENTIN) tablet 0.5 mg  0.5 mg Oral QHS Worthy Flank, NP   0.5 mg at 09/11/15 2158  . risperiDONE (RISPERDAL M-TABS) disintegrating tablet 1 mg  1 mg Oral TID PRN Jomarie Longs, MD       And  . diphenhydrAMINE (BENADRYL) capsule 25 mg  25 mg Oral Q8H PRN Jomarie Longs, MD      .  escitalopram (LEXAPRO) tablet 10 mg  10 mg Oral Daily Jomarie Longs, MD   10 mg at 09/12/15 0805  . famotidine (PEPCID) tablet 20 mg  20 mg Oral BID Worthy Flank, NP   20 mg at 09/12/15 0805  . hydrOXYzine (ATARAX/VISTARIL) tablet 25 mg  25 mg Oral Q6H PRN Worthy Flank, NP      . LORazepam (ATIVAN) tablet 1 mg  1 mg Oral Q6H PRN Jomarie Longs, MD       Or  . LORazepam (ATIVAN) injection 1 mg  1 mg Intramuscular Q6H PRN Jomarie Longs, MD      . magnesium hydroxide (MILK OF MAGNESIA) suspension 30 mL  30 mL Oral Daily PRN Worthy Flank, NP      . traZODone (DESYREL) tablet 100 mg  100 mg Oral QHS PRN  Jomarie Longs, MD   100 mg at 09/11/15 2158    Lab Results:  Results for orders placed or performed during the hospital encounter of 09/09/15 (from the past 48 hour(s))  Urinalysis with microscopic (not at [facility])     Status: Abnormal   Collection Time: 09/10/15  6:31 PM  Result Value Ref Range   Color, Urine YELLOW YELLOW   APPearance CLEAR CLEAR   Specific Gravity, Urine 1.025 1.005 - 1.030   pH 7.0 5.0 - 8.0   Glucose, UA NEGATIVE NEGATIVE mg/dL   Hgb urine dipstick NEGATIVE NEGATIVE   Bilirubin Urine NEGATIVE NEGATIVE   Ketones, ur NEGATIVE NEGATIVE mg/dL   Protein, ur NEGATIVE NEGATIVE mg/dL   Nitrite NEGATIVE NEGATIVE   Leukocytes, UA NEGATIVE NEGATIVE   WBC, UA 0-5 0 - 5 WBC/hpf   RBC / HPF NONE SEEN 0 - 5 RBC/hpf   Bacteria, UA NONE SEEN NONE SEEN   Squamous Epithelial / LPF 0-5 (A) NONE SEEN   Crystals CA OXALATE CRYSTALS (A) NEGATIVE   Urine-Other MUCOUS PRESENT     Comment: Performed at [facility]  Urine culture     Status: None   Collection Time: 09/10/15  6:31 PM  Result Value Ref Range   Specimen Description      URINE, CLEAN CATCH Performed at [facility]    Special Requests      Normal Performed at [facility]    Culture      NO GROWTH 1 DAY Performed at [facility]    Report Status 09/12/2015 FINAL   TSH     Status: None   Collection Time: 09/11/15  6:30 AM  Result Value Ref Range   TSH 1.175 0.350 - 4.500 uIU/mL    Comment: Performed at [facility]  Lipid panel     Status: Abnormal   Collection Time: 09/11/15  6:30 AM  Result Value Ref Range   Cholesterol 131 0 - 200 mg/dL   Triglycerides 58 <161 mg/dL   HDL 40 (L) >09 mg/dL   Total CHOL/HDL Ratio 3.3 RATIO   VLDL 12 0 - 40 mg/dL   LDL Cholesterol 79 0 - 99 mg/dL    Comment:        Total Cholesterol/HDL:CHD Risk Coronary Heart Disease Risk Table                     Men   Women  1/2 Average Risk   3.4    3.3  Average Risk       5.0   4.4  2 X Average Risk  9.6   7.1  3 X Average Risk  23.4   11.0        Use the calculated Patient Ratio above and the CHD Risk Table to determine the patient's CHD Risk.        ATP III CLASSIFICATION (LDL):  <100     mg/dL   Optimal  696-295  mg/dL   Near or Above                    Optimal  130-159  mg/dL   Borderline  284-132  mg/dL   High  >440     mg/dL   Very High Performed at [facility]     Physical Findings: AIMS: Facial and Oral Movements Muscles of Facial Expression: None, normal Lips and Perioral Area: None, normal Jaw: None, normal Tongue: None, normal,Extremity Movements Upper (arms, wrists, hands, fingers): None, normal Lower (legs, knees, ankles, toes): None, normal, Trunk Movements Neck, shoulders, hips: None, normal, Overall Severity Severity of abnormal movements (highest score from questions above): None, normal Incapacitation due to abnormal movements: None, normal Patient's awareness of abnormal movements (rate only patient's report): No Awareness, Dental Status Current problems with teeth and/or dentures?: No Does patient usually wear dentures?: No  CIWA:    COWS:     Musculoskeletal: Strength & Muscle Tone: within normal limits Gait & Station: normal Patient leans: N/A  Psychiatric Specialty Exam: Review of Systems  Cardiovascular: Negative for chest pain.  Musculoskeletal: Negative.   Skin: Negative for itching and rash.  Neurological: Negative for dizziness, tingling and headaches.  Psychiatric/Behavioral: Positive for depression and hallucinations. The patient is nervous/anxious and has insomnia.     Blood pressure 118/64, pulse 104, temperature 97.9 F (36.6 C), temperature source Oral, resp. rate 16, height  (1.803 m), weight 91.173 kg (201 lb).Body mass index is 28.05 kg/(m^2).  General Appearance: Fairly Groomed and Guarded  Patent attorney::  Minimal  Speech:  Slow  Volume:  Decreased   Mood:  Anxious and Depressed  Affect:  Depressed, Flat, Inappropriate and Restricted  Thought Process:  Circumstantial, Disorganized, Irrelevant, Logical and Loose  Orientation:  Full (Time, Place, and Person)  Thought Content:  Hallucinations: Auditory and Paranoid Ideation  Suicidal Thoughts:  No  Homicidal Thoughts:  No  Memory:  Immediate;   Poor Recent;   Fair Remote;   Poor  Judgement:  Impaired  Insight:  Lacking  Psychomotor Activity:  Decreased  Concentration:  Poor  Recall:  Poor  Fund of Knowledge:Fair  Language: Fair  Akathisia:  No  Handed:  Right  AIMS (if indicated):     Assets:  Housing Social Support  ADL's:  Intact  Cognition: Impaired,  Mild  Sleep:  Number of Hours: 6.75   Treatment Plan Summary: Daily contact with patient to assess and evaluate symptoms and progress in treatment and Medication management   Will continue Abilify 10 mg po BID for psychosis/augmenting the effect of antidepressant. Will continue Lexapro 10 mg po daily for affective sx. Continue Trazodone 100 mg po qhs prn for sleep. Will make available PRN medications as per agitation protocol. Will continue to monitor vitals ,medication compliance and treatment side effects while patient is here.  Will monitor for medical issues as well as call consult as needed.  Reviewed labs ,UDS- negative,BAL<5,CBC-wnl,CMP - wnl - TSH, lipid panel and prolactin still in process.   CSW will start working on disposition.  Patient to participate in therapeutic milieu .   ARFEEN,SYED  T., MD 09/12/2015, 12:36 PM

## 2015-09-12 NOTE — [facility] Group Notes (Signed)
[facility] Group Notes:  (Clinical Social Work)  09/12/2015  [facility] Group Notes:  (Clinical Social Work)  09/12/2015  11:00AM-12:00PM  Summary of Progress/Problems:  The main focus of today's process group was to listen to a variety of genres of music and to identify that different types of music provoke different responses.  The patient then was able to identify personally what was soothing for them, as well as energizing.    The patient expressed no feelings about the songs during group, was late and perhaps did not understand.  Type of Therapy:  Music Therapy   Participation Level:  Active  Participation Quality:  Attentive   Affect:  Blunted  Cognitive:  Confused  Insight:  Limited  Engagement in Therapy: Limited  Modes of Intervention:   Activity, Exploration  Ambrose Mantle, LCSW 09/12/2015

## 2015-09-12 NOTE — Progress Notes (Signed)
D: Marvin Crawford has been calm and cooperative on the unit. He admits some SI but contracts for safety and says he has no plan. He seems to have some thought blocking. On his self-inventory sheet, he rated his depression, feelings of hopelessness, and anxiety all at 10. He hopes working on talking more with people today. A: Meds given as ordered. Q15 safety checks maintained. Support/encouragement offered. R: Pt remains free from harm and continues with treatment. Will continue to monitor for needs/safety.

## 2015-09-12 NOTE — Progress Notes (Signed)
[facility] Group Notes:  (Nursing/MHT/Case Management/Adjunct)  Date:  09/12/2015  Time:  1:23 AM  Type of Therapy:  Psychoeducational Skills  Participation Level:  Minimal  Participation Quality:  Resistant  Affect:  Blunted  Cognitive:  Appropriate  Insight:  Limited  Engagement in Group:  Resistant  Modes of Intervention:  Education  Summary of Progress/Problems: The patient was hesitant about responding in group. He did state that he attended groups but offered no additional details. As for the theme of the day, his coping skill will be to listen to music, read, and write.   Marvin Crawford 09/12/2015, 1:23 AM

## 2015-09-13 LAB — HEMOGLOBIN A1C
Hgb A1c MFr Bld: 5.7 % — ABNORMAL HIGH (ref 4.8–5.6)
MEAN PLASMA GLUCOSE: 117 mg/dL

## 2015-09-13 LAB — PROLACTIN: Prolactin: 2.1 ng/mL — ABNORMAL LOW (ref 4.0–15.2)

## 2015-09-13 MED ORDER — ESCITALOPRAM OXALATE 20 MG PO TABS
20.0000 mg | ORAL_TABLET | Freq: Every day | ORAL | Status: DC
  Administered 2015-09-14 – 2015-09-16 (×3): 20 mg via ORAL
  Filled 2015-09-13 (×5): qty 1

## 2015-09-13 MED ORDER — ARIPIPRAZOLE 15 MG PO TABS
15.0000 mg | ORAL_TABLET | Freq: Two times a day (BID) | ORAL | Status: DC
  Administered 2015-09-13 – 2015-09-16 (×6): 15 mg via ORAL
  Filled 2015-09-13 (×10): qty 1

## 2015-09-13 NOTE — Progress Notes (Signed)
[facility] MD Progress Note  09/13/2015 2:46 PM Marvin Crawford  MRN:  [id number] Subjective:  I'm depressed, I am hearing voices and I am seeing shadows and I have suicidal thoughts with a plan to cut.'   Objective;Marvin Crawford is a 20 year old AA male , single, unemployed , has a hx of MDD, Borderline intellectual functioning, speech impairment as well as cannabis abuse , who presented to the [facility] as a walk in with his mother.  Pt seen and chart reviewed. Pt discussed with treatment team. Pt today seen as depressed, appears to be in distress - reports he is having AH as well as VH of seeing shadows. Pt also is suicidal with a plan to cut. Pt also endorse depressive sx like sadness, low energy. Discussed with pt that his medications can be titrated higher. Pt encouraged to attend groups and participate in milieu. Per staff - pt continues to be depressed, labile , continues to need a lot of encouragement and support.     Principal Problem: MDD (major depressive disorder), recurrent, severe, with psychosis (HCC) Diagnosis:   Patient Active Problem List   Diagnosis Date Noted  . 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 Stuttering [F80.81] 08/09/2015  . Cannabis use disorder, moderate, dependence (HCC) [F12.20] 08/09/2015  . MDD (major depressive disorder), recurrent, severe, with psychosis (HCC) [F33.3] 02/16/2013   Total Time spent with patient: 30 minutes  Past Psychiatric History: See history and physical  Past Medical History:  Past Medical History  Diagnosis Date  . Environmental allergies   . ADD (attention deficit disorder)   . Depression   . Anxiety    Family History:  Family History  Problem Relation Age of Onset  . Hypertension Mother   . Hypertension Father   . Mental illness Neg Hx    Family Psychiatric  History: See history and physical Social History: Pt is single, unemployed , lives with mother, father is also involved currently. Pt used to work , but  lost job after he was admitted to the hospital last time History  Alcohol Use No     History  Drug Use  . Yes  . Special: Other-see comments, Marijuana    Comment: oil smoked in pipe calls "DAB", friends buy for him    Social History   Social History  . Marital Status: Single    Spouse Name: N/A  . Number of Children: N/A  . Years of Education: N/A   Social History Main Topics  . Smoking status: Never Smoker   . Smokeless tobacco: None  . Alcohol Use: No  . Drug Use: Yes    Special: Other-see comments, Marijuana     Comment: oil smoked in pipe calls "DAB", friends buy for him  . Sexual Activity: Yes    Birth Control/ Protection: Condom   Other Topics Concern  . None   Social History Narrative   Additional Social History:  Sleep: Fair  Appetite:  Fair  Current Medications: Current Facility-Administered Medications  Medication Dose Route Frequency Provider Last Rate Last Dose  . acetaminophen (TYLENOL) tablet 650 mg  650 mg Oral Q6H PRN Worthy Flank, NP      . alum & mag hydroxide-simeth (MAALOX/MYLANTA) 200-200-20 MG/5ML suspension 30 mL  30 mL Oral Q4H PRN Worthy Flank, NP      . ARIPiprazole (ABILIFY) tablet 15 mg  15 mg Oral BID Aurelio Mccamy, MD      . benztropine (COGENTIN) tablet 0.5 mg  0.5  mg Oral QHS Worthy Flank, NP   0.5 mg at 09/12/15 2126  . risperiDONE (RISPERDAL M-TABS) disintegrating tablet 1 mg  1 mg Oral TID PRN Jomarie Longs, MD       And  . diphenhydrAMINE (BENADRYL) capsule 25 mg  25 mg Oral Q8H PRN Jomarie Longs, MD      . Melene Muller ON 09/14/2015] escitalopram (LEXAPRO) tablet 20 mg  20 mg Oral Daily Thaine Garriga, MD      . famotidine (PEPCID) tablet 20 mg  20 mg Oral BID Worthy Flank, NP   20 mg at 09/13/15 0816  . hydrOXYzine (ATARAX/VISTARIL) tablet 25 mg  25 mg Oral Q6H PRN Worthy Flank, NP      . LORazepam (ATIVAN) tablet 1 mg  1 mg Oral Q6H PRN Jomarie Longs, MD       Or  . LORazepam (ATIVAN) injection 1 mg  1 mg  Intramuscular Q6H PRN Jaley Yan, MD      . magnesium hydroxide (MILK OF MAGNESIA) suspension 30 mL  30 mL Oral Daily PRN Worthy Flank, NP      . traZODone (DESYREL) tablet 100 mg  100 mg Oral QHS PRN Jomarie Longs, MD   100 mg at 09/12/15 2223    Lab Results:  No results found for this or any previous visit (from the past 48 hour(s)).  Physical Findings: AIMS: Facial and Oral Movements Muscles of Facial Expression: None, normal Lips and Perioral Area: None, normal Jaw: None, normal Tongue: None, normal,Extremity Movements Upper (arms, wrists, hands, fingers): None, normal Lower (legs, knees, ankles, toes): None, normal, Trunk Movements Neck, shoulders, hips: None, normal, Overall Severity Severity of abnormal movements (highest score from questions above): None, normal Incapacitation due to abnormal movements: None, normal Patient's awareness of abnormal movements (rate only patient's report): No Awareness, Dental Status Current problems with teeth and/or dentures?: No Does patient usually wear dentures?: No  CIWA:    COWS:     Musculoskeletal: Strength & Muscle Tone: within normal limits Gait & Station: normal Patient leans: N/A  Psychiatric Specialty Exam: Review of Systems  Musculoskeletal: Negative.   Psychiatric/Behavioral: Positive for depression, suicidal ideas, hallucinations and substance abuse. The patient is nervous/anxious.     Blood pressure 109/61, pulse 115, temperature 98.1 F (36.7 C), temperature source Oral, resp. rate 18, height 5\' 11"  (1.803 m), weight 91.173 kg (201 lb).Body mass index is 28.05 kg/(m^2).  General Appearance: Fairly Groomed and Guarded  Patent attorney::  Minimal  Speech:  Slow  Volume:  Decreased  Mood:  Anxious and Depressed  Affect:  Depressed  Thought Process:  Linear  Orientation:  Full (Time, Place, and Person)  Thought Content:  Hallucinations: Auditory Visual and Paranoid Ideation reports seeing shadows and having voices  - is unable to elaborate on his AH.  Suicidal Thoughts:  Yes.  with intent/plan To cut  Homicidal Thoughts:  No  Memory:  Immediate;   Fair Recent;   Fair Remote;   Fair  Judgement:  Impaired  Insight:  Lacking  Psychomotor Activity:  Decreased  Concentration:  Poor  Recall:  Poor  Fund of Knowledge:Fair  Language: Fair  Akathisia:  No  Handed:  Right  AIMS (if indicated):     Assets:  Housing Social Support  ADL's:  Intact  Cognition: Impaired,  Mild  Sleep:  Number of Hours: 6.75   Treatment Plan Summary:Marvin Crawford is a 20 year old AA male ,who has a hx of MDD, Borderline intellectual  functioning, cannabis abuse , who presented with worsening depression, anxiety, insomnia. Pt continues to be anxious , depressed .Will make medication readjustment. Will continue inpatient stay.  Daily contact with patient to assess and evaluate symptoms and progress in treatment and Medication management   Will increase Abilify to 15  mg po BID for psychosis/augmenting the effect of antidepressant. Will increase Lexapro to 20 mg po daily for affective sx. Continue Trazodone 100 mg po qhs prn for sleep. Will make available PRN medications as per agitation protocol. Will continue to monitor vitals ,medication compliance and treatment side effects while patient is here.  Will monitor for medical issues as well as call consult as needed.  Reviewed labs ,UDS- negative,BAL<5,CBC-wnl,CMP - wnl - TSH- wnl , lipid panel- wnl  and prolactin - 2.1 , Hba1c- wnl.  CSW will start working on disposition.  Patient to participate in therapeutic milieu .   AmeLie Hollars, MD 09/13/2015, 2:46 PM

## 2015-09-13 NOTE — Clinical Social Work Note (Signed)
Per mother Marylene Land, she is his mother, not his legal guardian.  FYI flag should be removed.  \ Santa Genera, LCSW Lead Clinical Social Worker Phone:  [phone]

## 2015-09-13 NOTE — Progress Notes (Signed)
Adult Psychoeducational Group Note  Date:  09/13/2015 Time:  9:10 PM  Group Topic/Focus:  Wrap-Up Group:   The focus of this group is to help patients review their daily goal of treatment and discuss progress on daily workbooks.  Participation Level:  Active  Participation Quality:  Appropriate  Affect:  Appropriate  Cognitive:  Appropriate  Insight: Appropriate  Engagement in Group:  Engaged  Modes of Intervention:  Discussion  Additional Comments: The patient expressed that he attended group.The patient also said that he rates his day a 10 which is good.  Marvin Crawford 09/13/2015, 9:10 PM

## 2015-09-13 NOTE — Progress Notes (Signed)
Patient ID: Marvin Crawford, male   DOB: 01/11/1996, 20 y.o.   MRN: [id number] D: Client is visible on the unit, has visit with dad and sister today. Client reports depression "5" of 10. "came back cause I wasn't feeling well" "hearing voices, but not a lot now, medication helping" Client likes attention at nurse's station a lot, asking for ginger ale. Client consenting to dad having information about his care, appropriate form signed by dad. A: Writer provided emotional support, encouraged client to consider drinking more water. Medication reviewed and administered as ordered. Staff will monitor q68min for safety. R: Client is safe on the unit.

## 2015-09-13 NOTE — Plan of Care (Signed)
Problem: Ineffective individual coping Goal: STG: Patient will remain free from self harm Outcome: Progressing Pt safe on the unit at this time     

## 2015-09-13 NOTE — Progress Notes (Signed)
D: Pt +ve SI/ AVH, denies HI but contracts for safety. . Pt is pleasant and cooperative. Pt stated he felt a little better. Pt plans to go back home with his mom when he D/C  A: Pt was offered support and encouragement. Pt was given scheduled medications. Pt was encourage to attend groups. Q 15 minute checks were done for safety.   R:Pt attends groups and interacts well with peers and staff. Pt is taking medication. Pt has no complaints at this time .Pt receptive to treatment and safety maintained on unit.

## 2015-09-13 NOTE — [facility] Group Notes (Signed)
[facility] LCSW Group Therapy  09/13/2015 5:38 PM  Type of Therapy:  Group Therapy  Participation Level:  Did Not Attend, remained in room but was invited.  Summary of Progress/Problems: Today's Topic: Overcoming Obstacles. Patients identified one short term goal and potential obstacles in reaching this goal. Patients processed barriers involved in overcoming these obstacles. Patients identified steps necessary for overcoming these obstacles and explored motivation (internal and external) for facing these difficulties head on. Today's Topic: Overcoming Obstacles. Patients identified one short term goal and potential obstacles in reaching this goal. Patients processed barriers involved in overcoming these obstacles. Patients identified steps necessary for overcoming these obstacles and explored motivation (internal and external) for facing these difficulties head on.    Sallee Lange 09/13/2015, 5:38 PM

## 2015-09-13 NOTE — Progress Notes (Signed)
D-  Patient has been compliant with medications this shift.  Patient reported a decrease in AVH but states he still hears negative voices and that he sees shadowy movements.  Patient denies SI and HI.   A- Assess for safety, offer medications as prescribed, engage patient in therapeutic staff talks,   R-  Patient able to contract for safety.

## 2015-09-13 NOTE — Progress Notes (Signed)
Recreation Therapy Notes  02.06.2017 LRT attempted to work with patient, however patient sleeping in his room at this time. LRT will return to work with patient during admission. Marykay Lex Shelise Maron, LRT/CTRS   Jearl Klinefelter 09/13/2015 3:41 PM

## 2015-09-13 NOTE — Plan of Care (Signed)
Problem: Diagnosis: Increased Risk For Suicide Attempt Goal: STG-Patient Will Comply With Medication Regime Outcome: Progressing Client is compliant with medication regime AEB taking medication as scheduled and asking for medications to aide in sleeping, without incidence. "Can I get my sleeping medicine" "I need my medicine"

## 2015-09-13 NOTE — Clinical Social Work Note (Addendum)
Patient has TCT Special educational needs teacher is Dene Gentry.  Per Lorie Phenix, she has notified him.  Santa Genera, LCSW Lead Clinical Social Worker Phone:  [phone]

## 2015-09-13 NOTE — [facility] Group Notes (Signed)
[facility] LCSW Aftercare Discharge Planning Group Note   09/13/2015 12:07 PM  Participation Quality:  Invited, wandered in/out of group but did not participate in meaningful way.    Plan for Discharge/Comments:  Concerned about getting his check from DSS payee, wants to find out how to replace free cell phone, follows up w [employer] [facility].    Transportation Means: thinks mother or [employer] will assist  Supports: Mother supportive  Sallee Lange

## 2015-09-14 NOTE — Progress Notes (Signed)
Adult Psychoeducational Group Note  Date:  09/14/2015 Time:  8:40 PM  Group Topic/Focus:  Wrap-Up Group:   The focus of this group is to help patients review their daily goal of treatment and discuss progress on daily workbooks.  Participation Level:  Active  Participation Quality:  Appropriate  Affect:  Appropriate  Cognitive:  Appropriate  Insight: Appropriate  Engagement in Group:  Engaged  Modes of Intervention:  Discussion  Additional Comments: The patient expressed that he attended group.The patient also said that he rates his day a 8.  Marvin Crawford 09/14/2015, 8:40 PM

## 2015-09-14 NOTE — Progress Notes (Signed)
D-  Patient has been isolative to his room this shift.  Patient disclosed today that he tried crack cocaine prior to admission and that he wants to try LSD and heroin "to get it out of the way"  Patient has no insight into how these drugs will affect him or the potential for addiction.  Patient reports feeling depressed, but denies SI, HI, and AVH.   A- assess patient for safety, offer medications as prescribed, engaged patient in 1:1 therapeutic talks.   R-  Patient able to contract for safety.

## 2015-09-14 NOTE — Progress Notes (Signed)
[facility] MD Progress Note  09/14/2015 2:31 PM Marvin Crawford  MRN:  [id number] Subjective: Pt states  " I feel better today.'    Objective;Marvin Crawford is a 20 year old AA male , single, unemployed , has a hx of MDD, Borderline intellectual functioning, speech impairment as well as cannabis abuse , who presented to the [facility] as a walk in with his mother.  Pt seen and chart reviewed. Pt discussed with treatment team. Pt today seen as less depressed,appears to be more pleasant today than yesterday . Pt reports that his AH/VH is improving and he does not appear to be paranoid . Pt did talk to RT about his recent cocaine abuse ( please see RT notes) . Pt reports that he was given cocaine by a friend and he felt this was part of becoming an adult . Provided education about substance abuse. Per staff - pt is less depressed, less psychotic , sleep is improved.  Principal Problem: MDD (major depressive disorder), recurrent, severe, with psychosis (HCC) Diagnosis:   Patient Active Problem List   Diagnosis Date Noted  . 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 Stuttering [F80.81] 08/09/2015  . Cannabis use disorder, moderate, dependence (HCC) [F12.20] 08/09/2015  . MDD (major depressive disorder), recurrent, severe, with psychosis (HCC) [F33.3] 02/16/2013   Total Time spent with patient: 20 minutes  Past Psychiatric History: See history and physical  Past Medical History:  Past Medical History  Diagnosis Date  . Environmental allergies   . ADD (attention deficit disorder)   . Depression   . Anxiety    Family History:  Family History  Problem Relation Age of Onset  . Hypertension Mother   . Hypertension Father   . Mental illness Neg Hx    Family Psychiatric  History: See history and physical Social History: Pt is single, unemployed , lives with mother, father is also involved currently. Pt used to work , but lost job after he was admitted to the hospital last time History   Alcohol Use No     History  Drug Use  . Yes  . Special: Other-see comments, Marijuana    Comment: oil smoked in pipe calls "DAB", friends buy for him    Social History   Social History  . Marital Status: Single    Spouse Name: N/A  . Number of Children: N/A  . Years of Education: N/A   Social History Main Topics  . Smoking status: Never Smoker   . Smokeless tobacco: None  . Alcohol Use: No  . Drug Use: Yes    Special: Other-see comments, Marijuana     Comment: oil smoked in pipe calls "DAB", friends buy for him  . Sexual Activity: Yes    Birth Control/ Protection: Condom   Other Topics Concern  . None   Social History Narrative   Additional Social History:  Sleep: Fair  Appetite:  Fair  Current Medications: Current Facility-Administered Medications  Medication Dose Route Frequency Provider Last Rate Last Dose  . acetaminophen (TYLENOL) tablet 650 mg  650 mg Oral Q6H PRN Worthy Flank, NP      . alum & mag hydroxide-simeth (MAALOX/MYLANTA) 200-200-20 MG/5ML suspension 30 mL  30 mL Oral Q4H PRN Worthy Flank, NP      . ARIPiprazole (ABILIFY) tablet 15 mg  15 mg Oral BID Jomarie Longs, MD   15 mg at 09/14/15 0802  . benztropine (COGENTIN) tablet 0.5 mg  0.5 mg Oral QHS Worthy Flank, NP  0.5 mg at 09/13/15 2322  . risperiDONE (RISPERDAL M-TABS) disintegrating tablet 1 mg  1 mg Oral TID PRN Jomarie Longs, MD       And  . diphenhydrAMINE (BENADRYL) capsule 25 mg  25 mg Oral Q8H PRN Donelda Mailhot, MD      . escitalopram (LEXAPRO) tablet 20 mg  20 mg Oral Daily Jomarie Longs, MD   20 mg at 09/14/15 0802  . famotidine (PEPCID) tablet 20 mg  20 mg Oral BID Worthy Flank, NP   20 mg at 09/14/15 0804  . hydrOXYzine (ATARAX/VISTARIL) tablet 25 mg  25 mg Oral Q6H PRN Worthy Flank, NP   25 mg at 09/13/15 2322  . LORazepam (ATIVAN) tablet 1 mg  1 mg Oral Q6H PRN Jomarie Longs, MD       Or  . LORazepam (ATIVAN) injection 1 mg  1 mg Intramuscular Q6H PRN Coburn Knaus, MD      . magnesium hydroxide (MILK OF MAGNESIA) suspension 30 mL  30 mL Oral Daily PRN Worthy Flank, NP      . traZODone (DESYREL) tablet 100 mg  100 mg Oral QHS PRN Jomarie Longs, MD   100 mg at 09/12/15 2223    Lab Results:  No results found for this or any previous visit (from the past 48 hour(s)).  Physical Findings: AIMS: Facial and Oral Movements Muscles of Facial Expression: None, normal Lips and Perioral Area: None, normal Jaw: None, normal Tongue: None, normal,Extremity Movements Upper (arms, wrists, hands, fingers): None, normal Lower (legs, knees, ankles, toes): None, normal, Trunk Movements Neck, shoulders, hips: None, normal, Overall Severity Severity of abnormal movements (highest score from questions above): None, normal Incapacitation due to abnormal movements: None, normal Patient's awareness of abnormal movements (rate only patient's report): No Awareness, Dental Status Current problems with teeth and/or dentures?: No Does patient usually wear dentures?: No  CIWA:    COWS:     Musculoskeletal: Strength & Muscle Tone: within normal limits Gait & Station: normal Patient leans: N/A  Psychiatric Specialty Exam: Review of Systems  Musculoskeletal: Negative.   Psychiatric/Behavioral: Positive for depression and substance abuse. The patient is nervous/anxious.   All other systems reviewed and are negative.   Blood pressure 110/65, pulse 93, temperature 98.1 F (36.7 C), temperature source Oral, resp. rate 20, height  (1.803 m), weight 91.173 kg (201 lb).Body mass index is 28.05 kg/(m^2).  General Appearance: Fairly Groomed and Guarded  Patent attorney::  Minimal  Speech:  Slow  Volume:  Decreased  Mood:  Anxious and Depressed improving  Affect:  Depressed  Thought Process:  Linear  Orientation:  Full (Time, Place, and Person)  Thought Content:  Hallucinations: Auditory Visual and Paranoid Ideation improved  Suicidal Thoughts:  No  Homicidal  Thoughts:  No  Memory:  Immediate;   Fair Recent;   Fair Remote;   Fair  Judgement:  Impaired  Insight:  Lacking  Psychomotor Activity:  Normal  Concentration:  Fair  Recall:  [employer] of Knowledge:Fair  Language: Fair  Akathisia:  No  Handed:  Right  AIMS (if indicated):     Assets:  Housing Social Support  ADL's:  Intact  Cognition: Impaired,  Mild  Sleep:  Number of Hours: 6.5   Treatment Plan Summary:Lamaj Paget is a 20 year old AA male ,who has a hx of MDD, Borderline intellectual functioning, cannabis abuse , who presented with worsening depression, anxiety, insomnia. Pt is making progress.  Will continue inpatient  stay.  Daily contact with patient to assess and evaluate symptoms and progress in treatment and Medication management   Will continue  Abilify 15  mg po BID for psychosis/augmenting the effect of antidepressant. Will continue Lexapro  20 mg po daily for affective sx. Continue Trazodone 100 mg po qhs prn for sleep. Will make available PRN medications as per agitation protocol. Will continue to monitor vitals ,medication compliance and treatment side effects while patient is here.  Will monitor for medical issues as well as call consult as needed.  Reviewed labs ,UDS- negative,BAL<5,CBC-wnl,CMP - wnl - TSH- wnl , lipid panel- wnl  and prolactin - 2.1 , Hba1c- wnl.  CSW will start working on disposition.  Patient to participate in therapeutic milieu .   Isack Lavalley, MD 09/14/2015, 2:31 PM

## 2015-09-14 NOTE — Progress Notes (Signed)
Recreation Therapy Notes  02.07.2017 approximately 8:15am per MD order LRT met with patient to determine ways to enhance tx. Patient recently admitted, LRT familiar with patient in inquired about patient handling of anger following d/c. Patient reports he is managing his anger effectively, identifying coping skills of TV, music and deep breathing. Patient reports catalyst for this admission was smoking crack for the first time. Patient stated that he "wanted to know how it feels." Patient reports he additionally wants to try LSD and heroin. Patient identified that he has these desires to try drugs "just to get it out of the way" as it is a right of passage. Patient related drug use to growing up and becoming an adult and shared that people he associates with use drugs. LRT counseled patient on the fact not all adults use drugs and that drug use is a necessary part of being an adult. Patient receptive. Patient reports only desires for admission are to get on sleep medication to help him sleep.    Laureen Ochs Naviyah Schaffert, LRT/CTRS   Everlee Quakenbush L 09/14/2015 10:31 AM

## 2015-09-14 NOTE — Progress Notes (Signed)
D:Patient in the hallway on approach.  Patient states his day was ok.  Patient states she still is depressed and hearing voices.  Patient denies HI.  Patient verbally contracts for safety.  Patient states he is trying to use his coping skills such as walking and listening to music when available.   A: Staff to monitor Q 15 mins for safety.  Encouragement and support offered.  Scheduled medications administered per orders. R: Patient remains safe on the unit.  Patient attended group tonight.  Patient taking administered medications.  Patient visible on the unit tonight.

## 2015-09-14 NOTE — [facility] Group Notes (Signed)
[facility] LCSW Group Therapy  09/14/2015 , 3:08 PM   Type of Therapy:  Group Therapy  Participation Level:  Active  Participation Quality:  Attentive  Affect:  Appropriate  Cognitive:  Alert  Insight:  Improving  Engagement in Therapy:  Engaged  Modes of Intervention:  Discussion, Exploration and Socialization  Summary of Progress/Problems: Today's group focused on the term Diagnosis.  Participants were asked to define the term, and then pronounce whether it is a negative, positive or neutral term. Came in and out of group several times. Did not appear to be tuned in to topic nor discussion, and did not contribute anything.  Marvin Crawford 09/14/2015 , 3:08 PM

## 2015-09-15 MED ORDER — TRAZODONE HCL 50 MG PO TABS
125.0000 mg | ORAL_TABLET | Freq: Every evening | ORAL | Status: DC | PRN
  Administered 2015-09-15: 125 mg via ORAL
  Filled 2015-09-15: qty 3

## 2015-09-15 MED ORDER — HYDROXYZINE HCL 25 MG PO TABS
25.0000 mg | ORAL_TABLET | ORAL | Status: DC
  Administered 2015-09-15 – 2015-09-16 (×2): 25 mg via ORAL
  Filled 2015-09-15 (×6): qty 1

## 2015-09-15 MED ORDER — HYDROXYZINE HCL 25 MG PO TABS: 25.0000 mg | ORAL_TABLET | Freq: Every day | ORAL | Status: DC | PRN

## 2015-09-15 NOTE — Progress Notes (Signed)
Pt presents with depressed mood, affect blunted. Marvin Crawford was not forthcoming and would not engage writer much on approach this am. He reported his sleep was '' poor '' and declined to elaborate. Staff from previous shift did report that patient had to be moved from his room to the quiet room as he and peer unable to room together last night. The patient did report his mood was '' poor '' but declined to elaborate. Marvin Crawford denies any thoughts of suicidal ideation or self injurious behaviors. Discussed above with dr. Elna Breslow and treatment team. Pt was moved from room to another room to help prevent further sleep disturbance tonight. R. Pt is safe, denies any other acute concerns at this time. No further voiced concerns at this time.

## 2015-09-15 NOTE — [facility] Group Notes (Signed)
[facility] LCSW Aftercare Discharge Planning Group Note   09/15/2015 2:21 PM  Participation Quality:  In and out of group multiple times.  Never settled.    Marvin Crawford

## 2015-09-15 NOTE — [facility] Group Notes (Signed)
[facility] LCSW Group Therapy  09/15/2015 2:01 PM  Type of Therapy: Group Therapy  Participation Level: Was present for the beginning of group. Left a few minutes after group started and did not return.  Summary of Progress/Problems: Onalee Hua from [employer] was here to tell his story of recovery and play his guitar.  Marvin Crawford. Marvin Crawford 09/15/2015 2:01 PM

## 2015-09-15 NOTE — Progress Notes (Signed)
Recreation Therapy Notes  Animal-Assisted Activity (AAA) Program Checklist/Progress Notes Patient Eligibility Criteria Checklist & Daily Group note for Rec Tx Intervention  Date: 02.07.2017 Time: 2:45pm Location: [address]    AAA/T Program Assumption of Risk Form signed by Patient/ or Parent Legal Guardian yes  Patient is free of allergies or sever asthma yes  Patient reports no fear of animals yes  Patient reports no history of cruelty to animals yes  Patient understands his/her participation is voluntary yes  Patient washes hands before animal contact yes  Patient washes hands after animal contact yes  Behavioral Response: Appropriate  Education: Hand Washing, Appropriate Animal Interaction   Education Outcome: Acknowledges education.   Clinical Observations/Feedback: Patient evaluated for appropriateness to attend session, MD and LRT in agreement patient can attend session. Patient offered opportunity to attend and interact with therapy dog. Patient accepted offer and signed necessary consent form. Patient interacted appropriately with peers and therapy dog during session, due to large crowd in session patient returned to 500 hall within 10 minutes of session starting.   Marvin Crawford Marvin Crawford, LRT/CTRS  Marvin Crawford L 09/15/2015 10:05 AM

## 2015-09-15 NOTE — Plan of Care (Signed)
Problem: Alteration in thought process Goal: STG-Patient is able to discuss thoughts with staff Outcome: Progressing Pt completed self inventory, reports his mood is good. He is able to give limited description of thoughts and feelings.

## 2015-09-15 NOTE — Tx Team (Signed)
Interdisciplinary Treatment Plan Update (Adult)  Date:  09/15/2015 Time Reviewed:  8:45 AM  Progress in Treatment: Attending groups: Yes. Participating in groups: Yes. Taking medication as prescribed:  Yes. Tolerating medication:  Yes. Family/Significant othe contact made:  Yes, individual(s) contacted:  [name] (Mom/Guardian) [phone] Patient understands diagnosis:  No, limited insight. Discussing patient identified problems/goals with staff:  Yes, see initial care plan. Medical problems stabilized or resolved:  Yes Denies suicidal/homicidal ideation: Yes. Issues/concerns per patient self-inventory: No. Other:  New problem(s) identified: 09/15/15-Insomnia and Anxiety.  Medication adjustment for both.  See below  Discharge Plan or Barriers: See below   Reason for Continuation of Hospitalization: Depression Hallucinations Medication stabilization Suicidal ideation  Comments: Marvin Crawford is a 20 year old AA male , single, unemployed , has a hx of MDD, Borderline intellectual functioning, speech impairment as well as cannabis abuse , who on initial assessment endorsed suicidal ideation with no plan. Per notes in epic the patient called 911 asking to be taken to jail for being mean to his cousin. Marvin Crawford reported increased depression due to financial problems in his family. His mother reported patient has not been taking any medications for at least six months but has been smoking marijuana daily. He has a history of one psychiatric admission at 66 with Dr. Creig Hines on the adolescent unit. From reviewing this assessment patient is documented to have experienced suicidal thoughts at that time along with auditory hallucinations of the devil speaking to him. The patient has intellectual disability, and multiple developmental delays, including a phonological disorder. Abilify, Congentin, Lexapro trial  09/15/15: Pt had a restless night last night , appears anxious today. Will continue  inpatient stay  Will continue Abilify 15 mg po BID for psychosis/augmenting the effect of antidepressant. Will continue Lexapro 20 mg po daily for affective sx. Will add Vistaril 25 mg po bid , vistaril 25 mg po daily prn for anxiety sx. Increase Trazodone to 125 mg po qhs prn for sleep.  Estimated length of stay: 2-3 days days  New goal(s):  Review of initial/current patient goals per problem list:  1. Goal(s): Patient will participate in aftercare plan  Met:Yes   Target date: at discharge  As evidenced by: Patient will participate within aftercare plan AEB aftercare provider and housing plan at discharge being identified. 09/10/15: Pt will return home with mother and follow-up outpt with [facility].  2. Goal (s): Patient will exhibit decreased depressive symptoms and suicidal ideations.  Met: Yes  Target date: at discharge  As evidenced by: Patient will utilize self rating of depression at 3 or below and demonstrate decreased signs of depression or be deemed stable for discharge by MD. 09/10/15: Pt endorses SI and depressive symptoms. 09/15/15:  Pt denies depression today   4. Goal(s): Patient will demonstrate decreased signs of psychosis.  Met: Yes  Target date:at discharge  As evidenced by: Patient will demonstrate decreased signs of psychosis as evidenced by a reduction in AVH, paranoia, and/or delusions.   09/10/15: Pt appears to be attending to internal stimuli as evidenced by thought blocking and inability to concentrate. 09/15/15:  Pt is at baseline     Attendees: Patient:  09/15/2015 8:45 AM  Family:   09/15/2015 8:45 AM  Physician:  Dr. Ursula Alert, MD 09/15/2015 8:45 AM  Nursing:  Manuella Ghazi, RN 09/15/2015 8:45 AM  Case Manager:  Roque Lias, LCSW 09/15/2015 8:45 AM  Counselor:  Matthew Saras, MSW Intern 09/15/2015 8:45 AM  Other:   09/15/2015 8:45  AM  Other:   09/15/2015 8:45 AM  Other:   09/15/2015 8:45 AM  Other:  09/15/2015 8:45 AM  Other:     Other:    Other:    Other:    Other:    Other:      Scribe for Treatment Team:   Georga Kaufmann, MSW Intern 09/15/2015 8:45 AM

## 2015-09-15 NOTE — Progress Notes (Signed)
Adult Psychoeducational Group Note  Date:  09/15/2015 Time:  9:18 PM  Group Topic/Focus:  Wrap-Up Group:   The focus of this group is to help patients review their daily goal of treatment and discuss progress on daily workbooks.  Participation Level:  Active  Participation Quality:  Appropriate  Affect:  Appropriate  Cognitive:  Alert  Insight: Appropriate  Engagement in Group:  Engaged  Modes of Intervention:  Discussion  Additional Comments:  Patient goal for today was to take his medication for depression and anxiety. On a scale from 1-10, (1=worst, 10=best) patient rated his day as a 5.  Rut Betterton L Mcdonald Reiling 09/15/2015, 9:18 PM

## 2015-09-15 NOTE — Progress Notes (Signed)
[facility] MD Progress Note  09/15/2015 10:04 AM AYDON SWAMY  MRN:  [id number] Subjective: Pt states  " I feel anxious , I did not sleep all that well.'     Objective;Khylan Krejci is a 20 year old AA male , single, unemployed , has a hx of MDD, Borderline intellectual functioning, speech impairment as well as cannabis abuse , who presented to the [facility] as a walk in with his mother.  Pt seen and chart reviewed. Pt discussed with treatment team.  Pt today seen as anxious , has a depressed and anxious affect . Pt reports that sleep last night was restless and he reports it was mostly due to being anxious . Discussed with pt about his stressors and what is making him anxious. Pt with borderline intellectual functioning as well as speech impairment , pt has difficulty verbalizing how he feels. Pt observed as restless during evaluation. Pt advised to use coping techniques as well as PRN medications. Discussed that his medications will be readjusted. Per staff - pt appears depressed , unable to talk about his distress, had difficulty sleeping last night.     Principal Problem: MDD (major depressive disorder), recurrent, severe, with psychosis (HCC) Diagnosis:   Patient Active Problem List   Diagnosis Date Noted  . 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 Stuttering [F80.81] 08/09/2015  . Cannabis use disorder, moderate, dependence (HCC) [F12.20] 08/09/2015  . MDD (major depressive disorder), recurrent, severe, with psychosis (HCC) [F33.3] 02/16/2013   Total Time spent with patient: 30 minutes  Past Psychiatric History: See history and physical  Past Medical History:  Past Medical History  Diagnosis Date  . Environmental allergies   . ADD (attention deficit disorder)   . Depression   . Anxiety    Family History:  Family History  Problem Relation Age of Onset  . Hypertension Mother   . Hypertension Father   . Mental illness Neg Hx    Family Psychiatric  History: See  history and physical Social History: Pt is single, unemployed , lives with mother, father is also involved currently. Pt used to work , but lost job after he was admitted to the hospital last time History  Alcohol Use No     History  Drug Use  . Yes  . Special: Other-see comments, Marijuana    Comment: oil smoked in pipe calls "DAB", friends buy for him    Social History   Social History  . Marital Status: Single    Spouse Name: N/A  . Number of Children: N/A  . Years of Education: N/A   Social History Main Topics  . Smoking status: Never Smoker   . Smokeless tobacco: None  . Alcohol Use: No  . Drug Use: Yes    Special: Other-see comments, Marijuana     Comment: oil smoked in pipe calls "DAB", friends buy for him  . Sexual Activity: Yes    Birth Control/ Protection: Condom   Other Topics Concern  . None   Social History Narrative   Additional Social History:  Sleep: Fair  Appetite:  Fair  Current Medications: Current Facility-Administered Medications  Medication Dose Route Frequency Provider Last Rate Last Dose  . acetaminophen (TYLENOL) tablet 650 mg  650 mg Oral Q6H PRN Worthy Flank, NP      . alum & mag hydroxide-simeth (MAALOX/MYLANTA) 200-200-20 MG/5ML suspension 30 mL  30 mL Oral Q4H PRN Worthy Flank, NP      . ARIPiprazole (ABILIFY) tablet 15 mg  15 mg Oral BID Jomarie Longs, MD   15 mg at 09/15/15 0743  . benztropine (COGENTIN) tablet 0.5 mg  0.5 mg Oral QHS Worthy Flank, NP   0.5 mg at 09/14/15 2209  . risperiDONE (RISPERDAL M-TABS) disintegrating tablet 1 mg  1 mg Oral TID PRN Jomarie Longs, MD       And  . diphenhydrAMINE (BENADRYL) capsule 25 mg  25 mg Oral Q8H PRN Shauntell Iglesia, MD      . escitalopram (LEXAPRO) tablet 20 mg  20 mg Oral Daily Jomarie Longs, MD   20 mg at 09/15/15 0743  . famotidine (PEPCID) tablet 20 mg  20 mg Oral BID Worthy Flank, NP   20 mg at 09/15/15 0743  . hydrOXYzine (ATARAX/VISTARIL) tablet 25 mg  25 mg Oral  BH-qamhs Woods Gangemi, MD      . hydrOXYzine (ATARAX/VISTARIL) tablet 25 mg  25 mg Oral Daily PRN Kambry Takacs, MD      . LORazepam (ATIVAN) tablet 1 mg  1 mg Oral Q6H PRN Jomarie Longs, MD       Or  . LORazepam (ATIVAN) injection 1 mg  1 mg Intramuscular Q6H PRN [place] Monds, MD      . magnesium hydroxide (MILK OF MAGNESIA) suspension 30 mL  30 mL Oral Daily PRN Worthy Flank, NP      . traZODone (DESYREL) tablet 125 mg  125 mg Oral QHS PRN Jomarie Longs, MD        Lab Results:  No results found for this or any previous visit (from the past 48 hour(s)).  Physical Findings: AIMS: Facial and Oral Movements Muscles of Facial Expression: None, normal Lips and Perioral Area: None, normal Jaw: None, normal Tongue: None, normal,Extremity Movements Upper (arms, wrists, hands, fingers): None, normal Lower (legs, knees, ankles, toes): None, normal, Trunk Movements Neck, shoulders, hips: None, normal, Overall Severity Severity of abnormal movements (highest score from questions above): None, normal Incapacitation due to abnormal movements: None, normal Patient's awareness of abnormal movements (rate only patient's report): No Awareness, Dental Status Current problems with teeth and/or dentures?: No Does patient usually wear dentures?: No  CIWA:    COWS:     Musculoskeletal: Strength & Muscle Tone: within normal limits Gait & Station: normal Patient leans: N/A  Psychiatric Specialty Exam: Review of Systems  Musculoskeletal: Negative.   Psychiatric/Behavioral: Positive for depression and substance abuse. The patient is nervous/anxious.   All other systems reviewed and are negative.   Blood pressure 100/65, pulse 111, temperature 98.1 F (36.7 C), temperature source Oral, resp. rate 18, height  (1.803 m), weight 91.173 kg (201 lb).Body mass index is 28.05 kg/(m^2).  General Appearance: Fairly Groomed and Guarded  Patent attorney::  Minimal  Speech:  Slow  Volume:   Decreased  Mood:  Anxious and Depressed   Affect:  Depressed  Thought Process:  Linear  Orientation:  Full (Time, Place, and Person)  Thought Content:  Hallucinations: Auditory Visual and Paranoid Ideation improved  Suicidal Thoughts:  No  Homicidal Thoughts:  No  Memory:  Immediate;   Fair Recent;   Fair Remote;   Fair  Judgement:  Impaired  Insight:  Lacking  Psychomotor Activity:  Normal  Concentration:  Fair  Recall:  [employer] of Knowledge:Fair  Language: Fair  Akathisia:  No  Handed:  Right  AIMS (if indicated):     Assets:  Housing Social Support  ADL's:  Intact  Cognition: Impaired,  Mild  Sleep:  Number of Hours: 5   Treatment Plan Summary:Manolo Newberry is a 20 year old AA male ,who has a hx of MDD, Borderline intellectual functioning, cannabis abuse , who presented with worsening depression, anxiety, insomnia. Pt had a restless night last night , appears anxious today.   Will continue inpatient stay.  Daily contact with patient to assess and evaluate symptoms and progress in treatment and Medication management   Will continue  Abilify 15  mg po BID for psychosis/augmenting the effect of antidepressant. Will continue Lexapro  20 mg po daily for affective sx. Will add Vistaril 25 mg po bid , vistaril 25 mg po daily prn for anxiety sx. Increase Trazodone to 125 mg po qhs prn for sleep. Will make available PRN medications as per agitation protocol. Will continue to monitor vitals ,medication compliance and treatment side effects while patient is here.  Will monitor for medical issues as well as call consult as needed.  Reviewed labs ,UDS- negative,BAL<5,CBC-wnl,CMP - wnl - TSH- wnl , lipid panel- wnl  and prolactin - 2.1 , Hba1c- wnl.  CSW will start working on disposition.  Patient to participate in therapeutic milieu .   Cornelio Parkerson, MD 09/15/2015, 10:04 AM

## 2015-09-15 NOTE — Progress Notes (Signed)
Patient ID: Marvin Crawford, male   DOB: 01-15-1996, 20 y.o.   MRN: [id number] D: Client is visible on the unit, at nurses station a lot. Client reports "depression, anxiety, paranoia" also reports has thoughts of harming self, but no plan. A: Writer provided emotional support, encouraged client to speak to staff with any concerns and report if thoughts of self harm turns to an actual plan. Client agrees and contracts. Medications reviewed and administered as ordered. Staff will monitor q50min for safety. R. Client is safe on the unit.

## 2015-09-16 MED ORDER — ESCITALOPRAM OXALATE 20 MG PO TABS: 20.0000 mg | ORAL_TABLET | Freq: Every day | ORAL | Status: DC

## 2015-09-16 MED ORDER — FAMOTIDINE 20 MG PO TABS: 20.0000 mg | ORAL_TABLET | Freq: Two times a day (BID) | ORAL | Status: DC

## 2015-09-16 MED ORDER — ARIPIPRAZOLE 15 MG PO TABS: 15.0000 mg | ORAL_TABLET | Freq: Two times a day (BID) | ORAL | Status: DC

## 2015-09-16 MED ORDER — TRAZODONE HCL 150 MG PO TABS: 125.0000 mg | ORAL_TABLET | Freq: Every evening | ORAL | Status: DC | PRN

## 2015-09-16 MED ORDER — HYDROXYZINE HCL 25 MG PO TABS: 25.0000 mg | ORAL_TABLET | Freq: Every day | ORAL | Status: DC | PRN

## 2015-09-16 MED ORDER — BENZTROPINE MESYLATE 0.5 MG PO TABS: 0.5000 mg | ORAL_TABLET | Freq: Every day | ORAL | Status: DC

## 2015-09-16 NOTE — Discharge Summary (Signed)
Physician Discharge Summary Note  Patient:  Marvin Crawford is an 20 y.o., male MRN:  [id number] DOB:  04-08-1996 Patient phone:  [phone] (home)  Patient address:   [address] [place] [address],  Total Time spent with patient: 30 minutes  Date of Admission:  09/09/2015 Date of Discharge:  09/16/2015   Reason for Admission:  Auditory hallucinations  Principal Problem: MDD (major depressive disorder), recurrent, severe, with psychosis [facility]) Discharge Diagnoses: Patient Active Problem List   Diagnosis Date Noted  . 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 Stuttering [F80.81] 08/09/2015  . Cannabis use disorder, moderate, dependence (HCC) [F12.20] 08/09/2015  . MDD (major depressive disorder), recurrent, severe, with psychosis (HCC) [F33.3] 02/16/2013    Past Psychiatric History:  See above noted  Past Medical History:  Past Medical History  Diagnosis Date  . Environmental allergies   . ADD (attention deficit disorder)   . Depression   . Anxiety    History reviewed. No pertinent past surgical history. Family History:  Family History  Problem Relation Age of Onset  . Hypertension Mother   . Hypertension Father   . Mental illness Neg Hx    Family Psychiatric  History:  Denied Social History:  History  Alcohol Use No     History  Drug Use  . Yes  . Special: Other-see comments, Marijuana    Comment: oil smoked in pipe calls "DAB", friends buy for him    Social History   Social History  . Marital Status: Single    Spouse Name: N/A  . Number of Children: N/A  . Years of Education: N/A   Social History Main Topics  . Smoking status: Never Smoker   . Smokeless tobacco: None  . Alcohol Use: No  . Drug Use: Yes    Special: Other-see comments, Marijuana     Comment: oil smoked in pipe calls "DAB", friends buy for him  . Sexual Activity: Yes    Birth Control/ Protection: Condom   Other Topics Concern  . None   Social History  Narrative    Hospital Course:  Marvin Crawford, 20 year old male with hx of MDD, Borderline intellectual functioning, speech impairment as well as cannabis abuse, who presented to the [facility] as a walk in with his mother.  He reported hearing voices telling him to harm himself.  Marvin Crawford was admitted for MDD (major depressive disorder), recurrent, severe, with psychosis (HCC) and crisis management.  He was treated with the following medications, Abilify 15 mg po BID for psychosis/augmenting the effect of antidepressant, Lexapro 20 mg po daily for affective sx, Vistaril 25 mg po bid and prn for anxiety sx, Trazodone to 125 mg po qhs prn for sleep.  Patient participated in therapeutic milieu .     MELECIO CUETO was discharged with current medication and was instructed on how to take medications as prescribed; (details listed below under Medication List).  Medical problems were identified and treated as needed.  Home medications were restarted as appropriate.  Vital signs monitored, medication compliance and treatment side effects monitored.  Reviewed labs.  Improvement was monitored by observation and Marvin Crawford daily report of symptom reduction.  Emotional and mental status was monitored by daily self-inventory reports completed by Marvin Crawford and clinical staff.         CHIDERA THIVIERGE was evaluated by the treatment team for stability and plans for continued recovery upon discharge.  RASHOD GOUGEON motivation was  an integral factor for scheduling further treatment.  Employment, transportation, bed availability, health status, family support, and any pending legal issues were also considered during his hospital stay.  He was offered further treatment options upon discharge including but not limited to Residential, Intensive Outpatient, and Outpatient treatment.  WELFORD CHRISTMAS will follow up with the services as listed below under Follow Up Information.     Upon completion of this admission the  [name] was both mentally and medically stable for discharge denying suicidal/homicidal ideation, auditory/visual/tactile hallucinations, delusional thoughts and paranoia.     Physical Findings: AIMS: Facial and Oral Movements Muscles of Facial Expression: None, normal Lips and Perioral Area: None, normal Jaw: None, normal Tongue: None, normal,Extremity Movements Upper (arms, wrists, hands, fingers): None, normal Lower (legs, knees, ankles, toes): None, normal, Trunk Movements Neck, shoulders, hips: None, normal, Overall Severity Severity of abnormal movements (highest score from questions above): None, normal Incapacitation due to abnormal movements: None, normal Patient's awareness of abnormal movements (rate only patient's report): No Awareness, Dental Status Current problems with teeth and/or dentures?: No Does patient usually wear dentures?: No  CIWA:    COWS:     Musculoskeletal: Strength & Muscle Tone: within normal limits Gait & Station: normal Patient leans: N/A  Psychiatric Specialty Exam:  SEE MD SRA Review of Systems  All other systems reviewed and are negative.   Blood pressure 111/69, pulse 83, temperature 97.8 F (36.6 C), temperature source Oral, resp. rate 20, height  (1.803 m), weight 91.173 kg (201 lb).Body mass index is 28.05 kg/(m^2).  Have you used any form of tobacco in the last 30 days? (Cigarettes, Smokeless Tobacco, Cigars, and/or Pipes): No  Has this patient used any form of tobacco in the last 30 days? (Cigarettes, Smokeless Tobacco, Cigars, and/or Pipes) Yes,  Patient refused  Metabolic Disorder Labs:  Lab Results  Component Value Date   HGBA1C 5.7* 09/11/2015   MPG 117 09/11/2015   MPG 111 02/17/2013   Lab Results  Component Value Date   PROLACTIN 2.1* 09/11/2015   Lab Results  Component Value Date   CHOL 131 09/11/2015   TRIG 58 09/11/2015   HDL 40* 09/11/2015   CHOLHDL 3.3 09/11/2015   VLDL 12 09/11/2015   LDLCALC 79  09/11/2015   LDLCALC 100 04/21/2015    See Psychiatric Specialty Exam and Suicide Risk Assessment completed by Attending Physician prior to discharge.  Discharge destination:  Home  Is patient on multiple antipsychotic therapies at discharge:  No   Has Patient had three or more failed trials of antipsychotic monotherapy by history:  No  Recommended Plan for Multiple Antipsychotic Therapies: NA     Medication List    STOP taking these medications        busPIRone 5 MG tablet  Commonly known as:  BUSPAR     mirtazapine 45 MG tablet  Commonly known as:  REMERON     nicotine 21 mg/24hr patch  Commonly known as:  NICODERM CQ - dosed in mg/24 hours      TAKE these medications      Indication   ARIPiprazole 15 MG tablet  Commonly known as:  ABILIFY  Take 1 tablet (15 mg total) by mouth 2 (two) times daily.   Indication:  Major Depressive Disorder, Crawford stabilization     benztropine 0.5 MG tablet  Commonly known as:  COGENTIN  Take 1 tablet (0.5 mg total) by mouth at bedtime.   Indication:  Extrapyramidal Reaction caused  by Medications     escitalopram 20 MG tablet  Commonly known as:  LEXAPRO  Take 1 tablet (20 mg total) by mouth daily.   Indication:  Major Depressive Disorder     famotidine 20 MG tablet  Commonly known as:  PEPCID  Take 1 tablet (20 mg total) by mouth 2 (two) times daily.   Indication:  Gastroesophageal Reflux Disease     hydrOXYzine 25 MG tablet  Commonly known as:  ATARAX/VISTARIL  Take 1 tablet (25 mg total) by mouth daily as needed for anxiety.   Indication:  Anxiety Neurosis     traZODone 150 MG tablet  Commonly known as:  DESYREL  Take 1 tablet (150 mg total) by mouth at bedtime as needed for sleep.   Indication:  Trouble Sleeping           Follow-up Information    Follow up with [facility].   Why:  Berenice Bouton or the therapist will call you about appointment times and dates for the therpaist and the Dr.    Benay Pillow information:   [address] [place]  [place], [place] [address] Phone: [phone] Fax:  [phone]      Follow up with [facility].   Why:  Call [name] at this number to set up an assessment time and date.   Contact information:   [address]  [place]  [phone]       Follow-up recommendations:  Activity:  as tol Diet:  as tol  Comments:  1.  Take all your medications as prescribed.              2.  Report any adverse side effects to outpatient provider.                       3.  Patient instructed to not use alcohol or illegal drugs while on prescription medicines.            4.  In the event of worsening symptoms, instructed patient to call 911, the crisis hotline or go to nearest emergency room for evaluation of symptoms.  Signed: Lindwood Qua, NP [facility] 09/16/2015, 1:55 PM

## 2015-09-16 NOTE — BHH Suicide Risk Assessment (Signed)
[facility] Discharge Suicide Risk Assessment   Principal Problem: MDD (major depressive disorder), recurrent, severe, with psychosis (HCC) Discharge Diagnoses:  Patient Active Problem List   Diagnosis Date Noted  . 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 Stuttering [F80.81] 08/09/2015  . Cannabis use disorder, moderate, dependence (HCC) [F12.20] 08/09/2015  . MDD (major depressive disorder), recurrent, severe, with psychosis (HCC) [F33.3] 02/16/2013    Total Time spent with patient: 30 minutes  Musculoskeletal: Strength & Muscle Tone: within normal limits Gait & Station: normal Patient leans: N/A  Psychiatric Specialty Exam: Review of Systems  Psychiatric/Behavioral: Positive for substance abuse. Negative for depression, suicidal ideas and hallucinations. The patient is not nervous/anxious.   All other systems reviewed and are negative.   Blood pressure 111/69, pulse 83, temperature 97.8 F (36.6 C), temperature source Oral, resp. rate 20, height  (1.803 m), weight 91.173 kg (201 lb).Body mass index is 28.05 kg/(m^2).  General Appearance: Casual  Eye Contact::  Fair  Speech:  Clear and Coherent409  Volume:  Normal  Mood:  Euthymic  Affect:  Appropriate  Thought Process:  Coherent  Orientation:  Full (Time, Place, and Person)  Thought Content:  WDL  Suicidal Thoughts:  No  Homicidal Thoughts:  No  Memory:  Immediate;   Fair Recent;   Fair Remote;   Fair  Judgement:  Fair  Insight:  Fair  Psychomotor Activity:  Normal  Concentration:  Fair  Recall:  [employer] of Knowledge:Fair  Language: Fair  Akathisia:  No  Handed:  Right  AIMS (if indicated):     Assets:  Desire for Improvement  Sleep:  Number of Hours: 6.5  Cognition: WNL  ADL's:  Intact   Mental Status Per Nursing Assessment::   On Admission:     Demographic Factors:  Male, Adolescent or young adult and Unemployed  Loss Factors: NA  Historical Factors: Impulsivity  Risk  Reduction Factors:   Positive social support  Continued Clinical Symptoms:  Previous Psychiatric Diagnoses and Treatments  Cognitive Features That Contribute To Risk:  Polarized thinking    Suicide Risk:  Minimal: No identifiable suicidal ideation.  Patients presenting with no risk factors but with morbid ruminations; may be classified as minimal risk based on the severity of the depressive symptoms  Follow-up Information    Follow up with [facility].   Why:  Patient is in [place] program and receives medication management.  SAIOP meets Mon/Weds/Fri.  Next appt for meds mgmt is    Contact information:   [address],  [place], [place] [address] Phone: [phone] Fax:  [phone]      Plan Of Care/Follow-up recommendations:  Activity:  No restriction Diet:  regular Tests:  as needed Other:  follow up with after care  Athen Riel, MD 09/16/2015, 9:30 AM

## 2015-09-16 NOTE — BHH Group Notes (Signed)
BHH Group Notes:  (Nursing/MHT/Case Management/Adjunct)  Date:  09/16/2015    Time:  0930 Type of Therapy:  Nurse Education  Participation Level:  Active  Participation Quality:  Appropriate and Attentive  Affect:  Appropriate  Cognitive:  Alert and Appropriate  Insight:  Appropriate and Good  Engagement in Group:  Engaged  Modes of Intervention:  Activity, Discussion, Education and Exploration  Summary of Progress/Problems: Topic was on leisure and lifestyle changes. Discussed the importance of choosing a healthy leisure activities. Group encouraged to surround themselves with positive and healthy group/support system when changing to a healthy lifestyle. Patient was receptive and contributed.   Mickie Bail 09/16/2015, 12:30 PM

## 2015-09-16 NOTE — Progress Notes (Signed)
Patient discharged home with prescriptions. Patient was stable and appreciative at that time. All papers and prescriptions were given and valuables returned. Verbal understanding expressed. Denies SI/HI and A/VH. Pt given opportunity to express concerns and ask questions.

## 2015-09-17 NOTE — Progress Notes (Signed)
  [facility] Adult Case Management Discharge Plan :  Will you be returning to the same living situation after discharge:  Yes,  home At discharge, do you have transportation home?: Yes,  mother Do you have the ability to pay for your medications: Yes,  MCD  Release of information consent forms completed and in the chart;  Patient's signature needed at discharge.  Patient to Follow up at: Follow-up Information    Follow up with [facility].   Why:  Marvin Crawford or the therapist will call you about appointment times and dates for the therpaist and the Dr.   [name] information:   [address]  [place], [place] [address] Phone: [phone] Fax:  [phone]      Follow up with [facility].   Why:  Call Marvin Crawford at this number to set up an assessment time and date.   Contact information:   [address]  [place]  [phone]       Next level of care provider has access to [place] [facility]: no  Safety Planning and Suicide Prevention discussed: Yes,  yes  Have you used any form of tobacco in the last 30 days? (Cigarettes, Smokeless Tobacco, Cigars, and/or Pipes): No  Has patient been referred to the Quitline?: N/A patient is not a smoker  Patient has been referred for addiction treatment: Yes  Marvin Crawford 09/17/2015, 2:36 PM

## 2016-03-15 ENCOUNTER — Emergency Department (HOSPITAL_COMMUNITY)
Admission: EM | Admit: 2016-03-15 | Discharge: 2016-03-15 | Disposition: A | Discharge status: Home / Self Care | Payer: No Typology Code available for payment source | Attending: Emergency Medicine | Admitting: Emergency Medicine

## 2016-03-15 ENCOUNTER — Encounter (HOSPITAL_COMMUNITY): Payer: Self-pay | Admitting: *Deleted

## 2016-03-15 DIAGNOSIS — Y9389 Activity, other specified: Secondary | ICD-10-CM | POA: Diagnosis not present

## 2016-03-15 DIAGNOSIS — Y92410 Unspecified street and highway as the place of occurrence of the external cause: Secondary | ICD-10-CM | POA: Diagnosis not present

## 2016-03-15 DIAGNOSIS — F1290 Cannabis use, unspecified, uncomplicated: Secondary | ICD-10-CM | POA: Diagnosis not present

## 2016-03-15 DIAGNOSIS — Y999 Unspecified external cause status: Secondary | ICD-10-CM | POA: Insufficient documentation

## 2016-03-15 DIAGNOSIS — Z041 Encounter for examination and observation following transport accident: Secondary | ICD-10-CM | POA: Insufficient documentation

## 2016-03-15 DIAGNOSIS — M791 Myalgia: Secondary | ICD-10-CM | POA: Insufficient documentation

## 2016-03-15 DIAGNOSIS — M79605 Pain in left leg: Secondary | ICD-10-CM | POA: Diagnosis present

## 2016-03-15 MED ORDER — IBUPROFEN 600 MG PO TABS: 600.0000 mg | ORAL_TABLET | Freq: Four times a day (QID) | ORAL | 0 refills | Status: DC | PRN
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
## 2016-03-15 NOTE — Discharge Instructions (Signed)
Take your medication as prescribed. I also recommend applying ice to affected area for 15-20 minutes 3-4 times daily for pain relief. Please follow up with a primary care provider from the Resource Guide provided below in 1 week as needed. Please return to the Emergency Department if symptoms worsen or new onset of fever, headache, visual changes, lightheadedness, dizziness, chest pain, difficulty breathing, abdominal pain, vomiting, numbness, tingling, weakness.
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
## 2016-12-23 ENCOUNTER — Emergency Department (HOSPITAL_COMMUNITY)
Admission: EM | Admit: 2016-12-23 | Discharge: 2016-12-23 | Disposition: A | Discharge status: Home / Self Care | Payer: Medicaid Other | Attending: Emergency Medicine | Admitting: Emergency Medicine

## 2016-12-23 ENCOUNTER — Encounter (HOSPITAL_COMMUNITY): Payer: Self-pay | Admitting: Oncology

## 2016-12-23 DIAGNOSIS — K649 Unspecified hemorrhoids: Secondary | ICD-10-CM

## 2016-12-23 DIAGNOSIS — K644 Residual hemorrhoidal skin tags: Secondary | ICD-10-CM | POA: Insufficient documentation

## 2016-12-23 DIAGNOSIS — Z79899 Other long term (current) drug therapy: Secondary | ICD-10-CM | POA: Insufficient documentation

## 2016-12-23 DIAGNOSIS — F909 Attention-deficit hyperactivity disorder, unspecified type: Secondary | ICD-10-CM | POA: Insufficient documentation

## 2016-12-23 DIAGNOSIS — K6289 Other specified diseases of anus and rectum: Secondary | ICD-10-CM | POA: Diagnosis present

## 2016-12-23 MED ORDER — HYDROCORTISONE 2.5 % RE CREA: TOPICAL_CREAM | RECTAL | 1 refills | Status: DC

## 2016-12-23 MED ORDER — DOCUSATE SODIUM 100 MG PO CAPS: 100.0000 mg | ORAL_CAPSULE | Freq: Two times a day (BID) | ORAL | 0 refills | Status: DC

## 2016-12-23 NOTE — ED Provider Notes (Signed)
[facility] DEPT Provider Note   CSN: [id number] Arrival date & time: 12/23/16  1909  By signing my name below, I, Diona Browner, attest that this documentation has been prepared under the direction and in the presence of Ok Edwards, [place]. Electronically Signed: Diona Browner, ED Scribe. 12/23/16. 8:01 PM.   History   Chief Complaint Chief Complaint  Patient presents with  . Rectal Pain    HPI Marvin Crawford is a 21 y.o. male who presents to the Emergency Department complaining of gradually worsening, burning and throbbing rectal pain for the last few days. He reports having a hemorrhoid for the past few years, that has recently worsened in discomfort. Associated sx include scant amounts of blood in stool.  Pain is exacerbated by BM. He rates his pain a 10/10 severity. He has been using suppositories with mild to no relief. Pt denies dysuria.   The history is provided by the patient. No language interpreter was used.    Past Medical History:  Diagnosis Date  . ADD (attention deficit disorder)   . Anxiety   . Depression   . Environmental allergies     Patient Active Problem List   Diagnosis Date Noted  . 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 Stuttering 08/09/2015  . Cannabis use disorder, moderate, dependence (HCC) 08/09/2015  . MDD (major depressive disorder), recurrent, severe, with psychosis (HCC) 02/16/2013    History reviewed. No pertinent surgical history.     Home Medications    Prior to Admission medications   Medication Sig Start Date End Date Taking? Authorizing Provider  ARIPiprazole (ABILIFY) 15 MG tablet Take 1 tablet (15 mg total) by mouth 2 (two) times daily. Patient not taking: Reported on 03/15/2016 09/16/15   Adonis Brook, NP  benztropine (COGENTIN) 0.5 MG tablet Take 1 tablet (0.5 mg total) by mouth at bedtime. Patient not taking: Reported on 03/15/2016 09/16/15   Adonis Brook, NP  escitalopram (LEXAPRO) 20 MG tablet Take 1  tablet (20 mg total) by mouth daily. Patient not taking: Reported on 03/15/2016 09/16/15   Adonis Brook, NP  famotidine (PEPCID) 20 MG tablet Take 1 tablet (20 mg total) by mouth 2 (two) times daily. Patient not taking: Reported on 03/15/2016 09/16/15   Adonis Brook, NP  hydrOXYzine (ATARAX/VISTARIL) 25 MG tablet Take 1 tablet (25 mg total) by mouth daily as needed for anxiety. Patient not taking: Reported on 03/15/2016 09/16/15   Adonis Brook, NP  ibuprofen (ADVIL,MOTRIN) 600 MG tablet Take 1 tablet (600 mg total) by mouth every 6 (six) hours as needed. 03/15/16   Barrett Henle, PA-C  traZODone (DESYREL) 150 MG tablet Take 1 tablet (150 mg total) by mouth at bedtime as needed for sleep. Patient not taking: Reported on 03/15/2016 09/16/15   Adonis Brook, NP    Family History Family History  Problem Relation Age of Onset  . Hypertension Mother   . Hypertension Father   . Mental illness Neg Hx     Social History Social History  Substance Use Topics  . Smoking status: Never Smoker  . Smokeless tobacco: Never Used  . Alcohol use No     Allergies   Patient has no known allergies.   Review of Systems Review of Systems  Gastrointestinal: Positive for blood in stool.  Genitourinary: Negative for dysuria.  All other systems reviewed and are negative.    Physical Exam Updated Vital Signs BP 138/72 (BP Location: Right Arm)   Pulse 86   Temp 98.3 F (36.8 C) (  Oral)   Resp 18   Ht 6\' 1"  (1.854 m)   Wt 262 lb (118.8 kg)   SpO2 99%   BMI 34.57 kg/m   Physical Exam  Constitutional: He appears well-developed and well-nourished. No distress.  HENT:  Head: Normocephalic and atraumatic.  Eyes: Conjunctivae are normal.  Neck: Normal range of motion.  Cardiovascular: Normal rate.   Pulmonary/Chest: Effort normal.  Abdominal: He exhibits no distension.  Musculoskeletal: Normal range of motion.  Small 5 mm external hemorrhoid.  Neurological: He is alert.  Skin: No  pallor.  Psychiatric: He has a normal mood and affect. His behavior is normal.  Nursing note and vitals reviewed.    ED Treatments / Results  DIAGNOSTIC STUDIES: Oxygen Saturation is 99% on RA, normal by my interpretation.   COORDINATION OF CARE: 8:01 PM-Discussed next steps with pt which includes soaking his bottom in a tub. Pt verbalized understanding and is agreeable with the plan.    Labs (all labs ordered are listed, but only abnormal results are displayed) Labs Reviewed - No data to display  EKG  EKG Interpretation None       Radiology No results found.  Procedures Procedures (including critical care time)  Medications Ordered in ED Medications - No data to display   Initial Impression / Assessment and Plan / ED Course  I have reviewed the triage vital signs and the nursing notes.  Pertinent labs & imaging results that were available during my care of the patient were reviewed by me and considered in my medical decision making (see chart for details).       Final Clinical Impressions(s) / ED Diagnoses   Final diagnoses:  Hemorrhoids, unspecified hemorrhoid type    New Prescriptions New Prescriptions   DOCUSATE SODIUM (COLACE) 100 MG CAPSULE    Take 1 capsule (100 mg total) by mouth every 12 (twelve) hours.   HYDROCORTISONE (ANUSOL-HC) 2.5 % RECTAL CREAM    Apply rectally 2 times daily   An After Visit Summary was printed and given to the patient.  I personally performed the services in this documentation, which was scribed in my presence.  The recorded information has been reviewed and considered.   Barnet PallKaren SofiaPAC.     Osie CheeksSofia, Shubham Thackston K, PA-C 12/23/16 2049    Jacalyn LefevreHaviland, Julie, MD 12/24/16 [phone]

## 2016-12-23 NOTE — ED Triage Notes (Signed)
Pt states that he has had rectal pain w/ BM x several days.  States he believes he may have a hemorrhoid.  Pt reports seeing scant amount of blood when he wipes after a BM.  Rates pain 10/10, burning and throbbing in nature.

## 2017-02-22 ENCOUNTER — Encounter (HOSPITAL_COMMUNITY): Payer: Self-pay | Admitting: Emergency Medicine

## 2017-02-22 ENCOUNTER — Emergency Department (HOSPITAL_COMMUNITY)
Admission: EM | Admit: 2017-02-22 | Discharge: 2017-02-22 | Disposition: A | Discharge status: Home / Self Care | Payer: Medicaid Other | Attending: Emergency Medicine | Admitting: Emergency Medicine

## 2017-02-22 DIAGNOSIS — L0231 Cutaneous abscess of buttock: Secondary | ICD-10-CM | POA: Diagnosis not present

## 2017-02-22 DIAGNOSIS — L0291 Cutaneous abscess, unspecified: Secondary | ICD-10-CM

## 2017-02-22 MED ORDER — IBUPROFEN 800 MG PO TABS: 800.0000 mg | ORAL_TABLET | Freq: Three times a day (TID) | ORAL | 0 refills | Status: DC

## 2017-02-22 MED ORDER — LIDOCAINE-EPINEPHRINE-TETRACAINE (LET) SOLUTION
3.0000 mL | Freq: Once | NASAL | Status: AC
  Administered 2017-02-22: 3 mL via TOPICAL
  Filled 2017-02-22: qty 3

## 2017-02-22 MED ORDER — LIDOCAINE HCL (PF) 1 % IJ SOLN: 10.0000 mL | Freq: Once | INTRAMUSCULAR | Status: DC

## 2017-02-22 MED ORDER — LIDOCAINE HCL 1 % IJ SOLN
INTRAMUSCULAR | Status: AC
  Administered 2017-02-22: 20 mL
  Filled 2017-02-22: qty 20

## 2017-02-22 MED ORDER — AMOXICILLIN-POT CLAVULANATE 875-125 MG PO TABS: 1.0000 | ORAL_TABLET | Freq: Two times a day (BID) | ORAL | 0 refills | Status: DC

## 2017-02-22 MED ORDER — SODIUM BICARBONATE 4 % IV SOLN
5.0000 mL | Freq: Once | INTRAVENOUS | Status: AC
  Administered 2017-02-22: 5 mL via SUBCUTANEOUS
  Filled 2017-02-22: qty 5

## 2017-02-22 MED ORDER — LIDOCAINE-EPINEPHRINE (PF) 2 %-1:200000 IJ SOLN
10.0000 mL | Freq: Once | INTRAMUSCULAR | Status: DC
  Filled 2017-02-22: qty 20

## 2017-02-22 NOTE — Discharge Instructions (Signed)
Return to the Emergency Department if you develop fever, you have worsening pain or swelling or other concerning symptoms.  If you have to have a bowel movement, do a [employer] (instructions in your discharge papers) to clean the abscess area. Dry it well and apply gauze dressing again.

## 2017-02-22 NOTE — ED Triage Notes (Signed)
Patient c/o left buttock that has been there about week. patient denies any drainage.

## 2017-02-22 NOTE — ED Provider Notes (Signed)
[facility] Provider Note   CSN: [id number] Arrival date & time: 02/22/17  0809     History   Chief Complaint Chief Complaint  Patient presents with  . Abscess    HPI Marvin Crawford is a 21 y.o. male.  HPI Patient reports a swollen tender area has been developing in his buttocks close to the anus over the past week. He reports as become more tender. There is been no drainage. He denies history of similar problem. Past Medical History:  Diagnosis Date  . ADD (attention deficit disorder)   . Anxiety   . Depression   . Environmental allergies     Patient Active Problem List   Diagnosis Date Noted  . 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 Stuttering 08/09/2015  . Cannabis use disorder, moderate, dependence (HCC) 08/09/2015  . MDD (major depressive disorder), recurrent, severe, with psychosis (HCC) 02/16/2013    History reviewed. No pertinent surgical history.     Home Medications    Prior to Admission medications   Medication Sig Start Date End Date Taking? Authorizing Provider  amoxicillin-clavulanate (AUGMENTIN) 875-125 MG tablet Take 1 tablet by mouth 2 (two) times daily. One po bid x 7 days 02/22/17   Arby Barrette, MD  ARIPiprazole (ABILIFY) 15 MG tablet Take 1 tablet (15 mg total) by mouth 2 (two) times daily. Patient not taking: Reported on 03/15/2016 09/16/15   Adonis Brook, NP  benztropine (COGENTIN) 0.5 MG tablet Take 1 tablet (0.5 mg total) by mouth at bedtime. Patient not taking: Reported on 03/15/2016 09/16/15   Adonis Brook, NP  docusate sodium (COLACE) 100 MG capsule Take 1 capsule (100 mg total) by mouth every 12 (twelve) hours. 12/23/16   Elson Areas, PA-C  escitalopram (LEXAPRO) 20 MG tablet Take 1 tablet (20 mg total) by mouth daily. Patient not taking: Reported on 03/15/2016 09/16/15   Adonis Brook, NP  famotidine (PEPCID) 20 MG tablet Take 1 tablet (20 mg total) by mouth 2 (two) times daily. Patient not taking: Reported on  03/15/2016 09/16/15   Adonis Brook, NP  hydrocortisone (ANUSOL-HC) 2.5 % rectal cream Apply rectally 2 times daily 12/23/16   Elson Areas, PA-C  hydrOXYzine (ATARAX/VISTARIL) 25 MG tablet Take 1 tablet (25 mg total) by mouth daily as needed for anxiety. Patient not taking: Reported on 03/15/2016 09/16/15   Adonis Brook, NP  ibuprofen (ADVIL,MOTRIN) 600 MG tablet Take 1 tablet (600 mg total) by mouth every 6 (six) hours as needed. 03/15/16   Barrett Henle, PA-C  ibuprofen (ADVIL,MOTRIN) 800 MG tablet Take 1 tablet (800 mg total) by mouth 3 (three) times daily. 02/22/17   Arby Barrette, MD  traZODone (DESYREL) 150 MG tablet Take 1 tablet (150 mg total) by mouth at bedtime as needed for sleep. Patient not taking: Reported on 03/15/2016 09/16/15   Adonis Brook, NP    Family History Family History  Problem Relation Age of Onset  . Hypertension Mother   . Hypertension Father   . Mental illness Neg Hx     Social History Social History  Substance Use Topics  . Smoking status: Never Smoker  . Smokeless tobacco: Never Used  . Alcohol use No     Allergies   Patient has no known allergies.   Review of Systems Review of Systems Constitutional: No fever no chills no malaise GI: No abdominal pain no nausea vomiting or diarrhea.  Physical Exam Updated Vital Signs BP 133/76 (BP Location: Left Arm)   Pulse 71  Temp 98.1 F (36.7 C) (Oral)   Resp 16   Ht 6\' 1"  (1.854 m)   SpO2 99%   Physical Exam  Constitutional: He is oriented to person, place, and time. He appears well-developed and well-nourished.  HENT:  Head: Normocephalic and atraumatic.  Eyes: Conjunctivae are normal.  Cardiovascular:  No murmur heard. Pulmonary/Chest: Effort normal. No respiratory distress.  Abdominal: Soft. There is no tenderness.  Genitourinary:  Genitourinary Comments: 2 cm fluctuant abscess adjacent to anus on the left gluteal cleft. This is outside of the anal margins. No surrounding  cellulitis or significant induration. Ultrasound used to confirm focal area of approximately 2 cm abscess pocket.  Musculoskeletal: He exhibits no edema.  Neurological: He is alert and oriented to person, place, and time. No cranial nerve deficit. He exhibits normal muscle tone. Coordination normal.  Skin: Skin is warm and dry.  Psychiatric: He has a normal mood and affect.  Nursing note and vitals reviewed.    ED Treatments / Results  Labs (all labs ordered are listed, but only abnormal results are displayed) Labs Reviewed - No data to display  EKG  EKG Interpretation None       Radiology No results found.  Procedures .Marland Kitchen.Incision and Drainage Date/Time: 02/22/2017 9:38 AM Performed by: Arby BarrettePFEIFFER, Judye Lorino Authorized by: Arby BarrettePFEIFFER, Theophil Thivierge   Consent:    Consent obtained:  Verbal   Consent given by:  Patient Location:    Type:  Abscess   Location:  Anogenital   Anogenital location:  Perianal Pre-procedure details:    Skin preparation:  Betadine Anesthesia (see MAR for exact dosages):    Anesthesia method:  Topical application and local infiltration   Topical anesthetic:  LET   Local anesthetic:  Sodium bicarbonate and lidocaine 1% w/o epi Procedure type:    Complexity:  Complex Procedure details:    Incision types:  Single with marsupialization   Incision depth:  Subcutaneous   Scalpel blade:  11   Wound management:  Probed and deloculated   Drainage:  Purulent   Drainage amount:  Moderate   Packing materials:  1/4 in iodoform gauze   Amount 1/4" iodoform:  10cm Post-procedure details:    Patient tolerance of procedure:  Tolerated well, no immediate complications   (including critical care time)  Medications Ordered in ED Medications  lidocaine-EPINEPHrine (XYLOCAINE W/EPI) 2 %-1:200000 (PF) injection 10 mL (10 mLs Infiltration Not Given 02/22/17 0839)  lidocaine (XYLOCAINE) 1 % (with pres) injection (not administered)  lidocaine (PF) (XYLOCAINE) 1 % injection 10  mL (not administered)  lidocaine-EPINEPHrine-tetracaine (LET) solution (3 mLs Topical Given 02/22/17 0839)  sodium bicarbonate (NEUT) 4 % injection 5 mL (5 mLs Subcutaneous Given 02/22/17 0849)     Initial Impression / Assessment and Plan / ED Course  I have reviewed the triage vital signs and the nursing notes.  Pertinent labs & imaging results that were available during my care of the patient were reviewed by me and considered in my medical decision making (see chart for details).     Final Clinical Impressions(s) / ED Diagnoses   Final diagnoses:  Abscess   Patient was focal abscess that drained easily with significant purulent drainage. This does not appear to include the direct perianal tissues or track to a rectal abscess. Patient nontoxic and otherwise well. Follow-up instructions reviewed. New Prescriptions New Prescriptions   AMOXICILLIN-CLAVULANATE (AUGMENTIN) 875-125 MG TABLET    Take 1 tablet by mouth 2 (two) times daily. One po bid x 7 days  IBUPROFEN (ADVIL,MOTRIN) 800 MG TABLET    Take 1 tablet (800 mg total) by mouth 3 (three) times daily.     Arby Barrette, MD 02/22/17 [phone]

## 2017-05-08 ENCOUNTER — Encounter (HOSPITAL_COMMUNITY): Payer: Self-pay

## 2017-05-08 ENCOUNTER — Emergency Department (HOSPITAL_COMMUNITY)
Admission: EM | Admit: 2017-05-08 | Discharge: 2017-05-09 | Disposition: A | Discharge status: Other Acute Inpatient Hospital | Payer: Medicaid Other | Attending: Emergency Medicine | Admitting: Emergency Medicine

## 2017-05-08 ENCOUNTER — Ambulatory Visit (HOSPITAL_COMMUNITY)
Admission: AD | Admit: 2017-05-08 | Discharge: 2017-05-08 | Disposition: A | Discharge status: Home / Self Care | Payer: Medicaid Other | Source: Home / Self Care | Attending: Psychiatry | Admitting: Psychiatry

## 2017-05-08 DIAGNOSIS — R45851 Suicidal ideations: Secondary | ICD-10-CM | POA: Diagnosis not present

## 2017-05-08 DIAGNOSIS — Z79899 Other long term (current) drug therapy: Secondary | ICD-10-CM | POA: Insufficient documentation

## 2017-05-08 DIAGNOSIS — F329 Major depressive disorder, single episode, unspecified: Secondary | ICD-10-CM | POA: Insufficient documentation

## 2017-05-08 DIAGNOSIS — R443 Hallucinations, unspecified: Secondary | ICD-10-CM | POA: Diagnosis present

## 2017-05-08 DIAGNOSIS — Z791 Long term (current) use of non-steroidal anti-inflammatories (NSAID): Secondary | ICD-10-CM | POA: Diagnosis not present

## 2017-05-08 LAB — ACETAMINOPHEN LEVEL: Acetaminophen (Tylenol), Serum: <10 ug/mL — ABNORMAL LOW (ref 10–30)

## 2017-05-08 LAB — COMPREHENSIVE METABOLIC PANEL WITH GFR
ALT: 27 U/L (ref 17–63)
AST: 25 U/L (ref 15–41)
Albumin: 4.4 g/dL (ref 3.5–5.0)
Alkaline Phosphatase: 73 U/L (ref 38–126)
Anion gap: 7 (ref 5–15)
BUN: 8 mg/dL (ref 6–20)
CO2: 26 mmol/L (ref 22–32)
Calcium: 9.7 mg/dL (ref 8.9–10.3)
Chloride: 107 mmol/L (ref 101–111)
Creatinine, Ser: 1.03 mg/dL (ref 0.61–1.24)
GFR calc Af Amer: >60 mL/min (ref >60)
GFR calc non Af Amer: >60 mL/min (ref >60)
Glucose, Bld: 93 mg/dL (ref 65–99)
Potassium: 3.9 mmol/L (ref 3.5–5.1)
Sodium: 140 mmol/L (ref 135–145)
Total Bilirubin: 0.5 mg/dL (ref 0.3–1.2)
Total Protein: 7.2 g/dL (ref 6.5–8.1)

## 2017-05-08 LAB — RAPID URINE DRUG SCREEN, HOSP PERFORMED
Amphetamines: NOT DETECTED
Barbiturates: NOT DETECTED
Benzodiazepines: NOT DETECTED
Cocaine: NOT DETECTED
Opiates: NOT DETECTED
Tetrahydrocannabinol: NOT DETECTED

## 2017-05-08 LAB — CBC
HCT: 40.8 % (ref 39.0–52.0)
Hemoglobin: 14.3 g/dL (ref 13.0–17.0)
MCH: 29.5 pg (ref 26.0–34.0)
MCHC: 35 g/dL (ref 30.0–36.0)
MCV: 84.3 fL (ref 78.0–100.0)
Platelets: 250 K/uL (ref 150–400)
RBC: 4.84 MIL/uL (ref 4.22–5.81)
RDW: 12.8 % (ref 11.5–15.5)
WBC: 4.9 K/uL (ref 4.0–10.5)

## 2017-05-08 LAB — ETHANOL

## 2017-05-08 LAB — SALICYLATE LEVEL: Salicylate Lvl: <7 mg/dL (ref 2.8–30.0)

## 2017-05-08 NOTE — ED Triage Notes (Signed)
Pt sent from [facility] for medical clearance He has a ready bed when he his medically cleared Pt went there for help with depression and SI

## 2017-05-08 NOTE — ED Notes (Signed)
Report given to [place] at [facility].

## 2017-05-08 NOTE — [facility] Counselor (Signed)
Per Donell Sievert, PA-C: Patient meets criteria for inpatient treatment.  Per AC, Fransico Michael, RN: Patient accepted to [facility].  Patient can come at anytime.    Room: 505-01 Attending Provider: Jama Flavors, MD Nursing Report: [phone]  [facility] notified Patria Mane, MD at [phone].

## 2017-05-08 NOTE — ED Notes (Signed)
[place] en route 

## 2017-05-08 NOTE — ED Notes (Signed)
Attempted to call report to [facility]. They state that they will have a nurse call me back.
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
## 2017-05-08 NOTE — ED Provider Notes (Signed)
[facility]-EMERGENCY DEPT Provider Note   CSN: [id number] Arrival date & time: 05/08/17  2055     History   Chief Complaint Chief Complaint  Patient presents with  . Medical Clearance    HPI Marvin Crawford is a 21 y.o. male.  HPI Patient is brought to the emergency department for medical clearance.  He was seen in behavioral health for increasing suicidal thoughts and hallucinations.  His had history of suicide attempt before in the past.  He is present with his mother.  He has no medical complaints at this time.  Denies chest or abdominal pain.  No headaches.   Past Medical History:  Diagnosis Date  . ADD (attention deficit disorder)   . Anxiety   . Depression   . Environmental allergies     Patient Active Problem List   Diagnosis Date Noted  . 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 Stuttering 08/09/2015  . Cannabis use disorder, moderate, dependence (HCC) 08/09/2015  . MDD (major depressive disorder), recurrent, severe, with psychosis (HCC) 02/16/2013    History reviewed. No pertinent surgical history.     Home Medications    Prior to Admission medications   Medication Sig Start Date End Date Taking? Authorizing Provider  amoxicillin-clavulanate (AUGMENTIN) 875-125 MG tablet Take 1 tablet by mouth 2 (two) times daily. One po bid x 7 days 02/22/17   Arby Barrette, MD  ARIPiprazole (ABILIFY) 15 MG tablet Take 1 tablet (15 mg total) by mouth 2 (two) times daily. Patient not taking: Reported on 03/15/2016 09/16/15   Adonis Brook, NP  benztropine (COGENTIN) 0.5 MG tablet Take 1 tablet (0.5 mg total) by mouth at bedtime. Patient not taking: Reported on 03/15/2016 09/16/15   Adonis Brook, NP  docusate sodium (COLACE) 100 MG capsule Take 1 capsule (100 mg total) by mouth every 12 (twelve) hours. 12/23/16   Elson Areas, PA-C  escitalopram (LEXAPRO) 20 MG tablet Take 1 tablet (20 mg total) by mouth daily. Patient not taking: Reported on 03/15/2016 09/16/15   Adonis Brook, NP  famotidine (PEPCID) 20 MG tablet Take 1 tablet (20 mg total) by mouth 2 (two) times daily. Patient not taking: Reported on 03/15/2016 09/16/15   Adonis Brook, NP  hydrocortisone (ANUSOL-HC) 2.5 % rectal cream Apply rectally 2 times daily 12/23/16   Elson Areas, PA-C  hydrOXYzine (ATARAX/VISTARIL) 25 MG tablet Take 1 tablet (25 mg total) by mouth daily as needed for anxiety. Patient not taking: Reported on 03/15/2016 09/16/15   Adonis Brook, NP  ibuprofen (ADVIL,MOTRIN) 600 MG tablet Take 1 tablet (600 mg total) by mouth every 6 (six) hours as needed. 03/15/16   Barrett Henle, PA-C  ibuprofen (ADVIL,MOTRIN) 800 MG tablet Take 1 tablet (800 mg total) by mouth 3 (three) times daily. 02/22/17   Arby Barrette, MD  traZODone (DESYREL) 150 MG tablet Take 1 tablet (150 mg total) by mouth at bedtime as needed for sleep. Patient not taking: Reported on 03/15/2016 09/16/15   Adonis Brook, NP    Family History Family History  Problem Relation Age of Onset  . Hypertension Mother   . Hypertension Father   . Mental illness Neg Hx     Social History Social History  Substance Use Topics  . Smoking status: Never Smoker  . Smokeless tobacco: Never Used  . Alcohol use No     Allergies   Patient has no known allergies.   Review of Systems Review of Systems  All other systems reviewed and are negative.  Physical Exam Updated Vital Signs BP 124/86 (BP Location: Left Arm)   Pulse 79   Temp 98.7 F (37.1 C) (Oral)   Resp 14   SpO2 99%   Physical Exam  Constitutional: He is oriented to person, place, and time. He appears well-developed and well-nourished.  HENT:  Head: Normocephalic.  Eyes: EOM are normal.  Neck: Normal range of motion.  Pulmonary/Chest: Effort normal.  Abdominal: He exhibits no distension.  Musculoskeletal: Normal range of motion.  Neurological: He is alert and oriented to person, place, and time.  Psychiatric:  SI.  Hallucinations.   Depressed mood  Nursing note and vitals reviewed.    ED Treatments / Results  Labs (all labs ordered are listed, but only abnormal results are displayed) Labs Reviewed  COMPREHENSIVE METABOLIC PANEL  ETHANOL  SALICYLATE LEVEL  ACETAMINOPHEN LEVEL  CBC  RAPID URINE DRUG SCREEN, HOSP PERFORMED    EKG  EKG Interpretation None       Radiology No results found.  Procedures Procedures (including critical care time)  Medications Ordered in ED Medications - No data to display   Initial Impression / Assessment and Plan / ED Course  I have reviewed the triage vital signs and the nursing notes.  Pertinent labs & imaging results that were available during my care of the patient were reviewed by me and considered in my medical decision making (see chart for details).     Medically clear at this time.  Labs pending.  Patient be admitted to inpatient psych.  Final Clinical Impressions(s) / ED Diagnoses   Final diagnoses:  None    New Prescriptions New Prescriptions   No medications on file     Azalia Bilis, MD 05/08/17 2150

## 2017-05-09 ENCOUNTER — Encounter (HOSPITAL_COMMUNITY): Payer: Self-pay

## 2017-05-09 ENCOUNTER — Inpatient Hospital Stay (HOSPITAL_COMMUNITY)
Admission: EM | Admit: 2017-05-09 | Discharge: 2017-05-15 | DRG: 885 | Disposition: A | Discharge status: Home / Self Care | Payer: Medicaid Other | Source: Intra-hospital | Attending: Psychiatry | Admitting: Psychiatry

## 2017-05-09 DIAGNOSIS — F17200 Nicotine dependence, unspecified, uncomplicated: Secondary | ICD-10-CM | POA: Diagnosis present

## 2017-05-09 DIAGNOSIS — R45851 Suicidal ideations: Secondary | ICD-10-CM | POA: Diagnosis present

## 2017-05-09 DIAGNOSIS — F988 Other specified behavioral and emotional disorders with onset usually occurring in childhood and adolescence: Secondary | ICD-10-CM | POA: Diagnosis present

## 2017-05-09 DIAGNOSIS — F1210 Cannabis abuse, uncomplicated: Secondary | ICD-10-CM | POA: Diagnosis not present

## 2017-05-09 DIAGNOSIS — F419 Anxiety disorder, unspecified: Secondary | ICD-10-CM | POA: Diagnosis not present

## 2017-05-09 DIAGNOSIS — F1220 Cannabis dependence, uncomplicated: Secondary | ICD-10-CM | POA: Diagnosis present

## 2017-05-09 DIAGNOSIS — F1910 Other psychoactive substance abuse, uncomplicated: Secondary | ICD-10-CM | POA: Diagnosis not present

## 2017-05-09 DIAGNOSIS — F258 Other schizoaffective disorders: Secondary | ICD-10-CM

## 2017-05-09 DIAGNOSIS — R450 Nervousness: Secondary | ICD-10-CM | POA: Diagnosis not present

## 2017-05-09 DIAGNOSIS — R440 Auditory hallucinations: Secondary | ICD-10-CM | POA: Diagnosis not present

## 2017-05-09 DIAGNOSIS — F333 Major depressive disorder, recurrent, severe with psychotic symptoms: Secondary | ICD-10-CM | POA: Diagnosis present

## 2017-05-09 DIAGNOSIS — Z9114 Patient's other noncompliance with medication regimen: Secondary | ICD-10-CM | POA: Diagnosis not present

## 2017-05-09 DIAGNOSIS — R441 Visual hallucinations: Secondary | ICD-10-CM | POA: Diagnosis not present

## 2017-05-09 DIAGNOSIS — R4587 Impulsiveness: Secondary | ICD-10-CM | POA: Diagnosis not present

## 2017-05-09 DIAGNOSIS — F39 Unspecified mood [affective] disorder: Secondary | ICD-10-CM | POA: Diagnosis not present

## 2017-05-09 DIAGNOSIS — R451 Restlessness and agitation: Secondary | ICD-10-CM | POA: Diagnosis not present

## 2017-05-09 DIAGNOSIS — F79 Unspecified intellectual disabilities: Secondary | ICD-10-CM | POA: Diagnosis present

## 2017-05-09 DIAGNOSIS — R4183 Borderline intellectual functioning: Secondary | ICD-10-CM

## 2017-05-09 DIAGNOSIS — F29 Unspecified psychosis not due to a substance or known physiological condition: Secondary | ICD-10-CM | POA: Diagnosis not present

## 2017-05-09 DIAGNOSIS — F22 Delusional disorders: Secondary | ICD-10-CM | POA: Diagnosis not present

## 2017-05-09 DIAGNOSIS — F8081 Childhood onset fluency disorder: Secondary | ICD-10-CM | POA: Diagnosis present

## 2017-05-09 DIAGNOSIS — Z8249 Family history of ischemic heart disease and other diseases of the circulatory system: Secondary | ICD-10-CM

## 2017-05-09 DIAGNOSIS — G4700 Insomnia, unspecified: Secondary | ICD-10-CM | POA: Diagnosis not present

## 2017-05-09 DIAGNOSIS — F17210 Nicotine dependence, cigarettes, uncomplicated: Secondary | ICD-10-CM | POA: Diagnosis not present

## 2017-05-09 DIAGNOSIS — F259 Schizoaffective disorder, unspecified: Secondary | ICD-10-CM | POA: Diagnosis present

## 2017-05-09 DIAGNOSIS — F1290 Cannabis use, unspecified, uncomplicated: Secondary | ICD-10-CM | POA: Diagnosis not present

## 2017-05-09 DIAGNOSIS — R443 Hallucinations, unspecified: Secondary | ICD-10-CM | POA: Diagnosis not present

## 2017-05-09 MED ORDER — NICOTINE 21 MG/24HR TD PT24
21.0000 mg | MEDICATED_PATCH | Freq: Every day | TRANSDERMAL | Status: DC
  Administered 2017-05-09 – 2017-05-10 (×2): 21 mg via TRANSDERMAL
  Filled 2017-05-09 (×5): qty 1

## 2017-05-09 MED ORDER — TRAZODONE HCL 100 MG PO TABS
100.0000 mg | ORAL_TABLET | Freq: Every evening | ORAL | Status: DC | PRN
  Administered 2017-05-09: 100 mg via ORAL
  Filled 2017-05-09 (×5): qty 1

## 2017-05-09 MED ORDER — ALUM & MAG HYDROXIDE-SIMETH 200-200-20 MG/5ML PO SUSP: 30.0000 mL | ORAL | Status: DC | PRN

## 2017-05-09 MED ORDER — QUETIAPINE FUMARATE ER 50 MG PO TB24
50.0000 mg | ORAL_TABLET | Freq: Every day | ORAL | Status: DC
  Administered 2017-05-09: 50 mg via ORAL
  Filled 2017-05-09 (×3): qty 1

## 2017-05-09 MED ORDER — TRAZODONE HCL 100 MG PO TABS
ORAL_TABLET | ORAL | Status: AC
  Filled 2017-05-09: qty 1

## 2017-05-09 MED ORDER — ACETAMINOPHEN 325 MG PO TABS
650.0000 mg | ORAL_TABLET | Freq: Four times a day (QID) | ORAL | Status: DC | PRN
  Administered 2017-05-11: 650 mg via ORAL
  Filled 2017-05-09: qty 2

## 2017-05-09 MED ORDER — MAGNESIUM HYDROXIDE 400 MG/5ML PO SUSP: 30.0000 mL | Freq: Every day | ORAL | Status: DC | PRN

## 2017-05-09 MED ORDER — QUETIAPINE FUMARATE 25 MG PO TABS
25.0000 mg | ORAL_TABLET | Freq: Two times a day (BID) | ORAL | Status: DC
  Administered 2017-05-10 – 2017-05-15 (×11): 25 mg via ORAL
  Filled 2017-05-09 (×17): qty 1

## 2017-05-09 MED ORDER — QUETIAPINE FUMARATE 50 MG PO TABS
50.0000 mg | ORAL_TABLET | Freq: Every day | ORAL | Status: DC
  Administered 2017-05-10 – 2017-05-11 (×2): 50 mg via ORAL
  Filled 2017-05-09 (×5): qty 1

## 2017-05-09 MED ORDER — FAMOTIDINE 20 MG PO TABS
20.0000 mg | ORAL_TABLET | Freq: Two times a day (BID) | ORAL | Status: DC
  Administered 2017-05-09 – 2017-05-15 (×13): 20 mg via ORAL
  Filled 2017-05-09 (×17): qty 1

## 2017-05-09 MED ORDER — TRAZODONE HCL 50 MG PO TABS
50.0000 mg | ORAL_TABLET | Freq: Every evening | ORAL | Status: DC | PRN
  Administered 2017-05-09 – 2017-05-14 (×6): 50 mg via ORAL
  Filled 2017-05-09 (×5): qty 1

## 2017-05-09 MED ORDER — HYDROXYZINE HCL 25 MG PO TABS
25.0000 mg | ORAL_TABLET | Freq: Four times a day (QID) | ORAL | Status: DC | PRN
  Administered 2017-05-10 – 2017-05-14 (×2): 25 mg via ORAL
  Filled 2017-05-09: qty 1

## 2017-05-09 MED ORDER — TRAZODONE HCL 100 MG PO TABS: 100.0000 mg | ORAL_TABLET | Freq: Every evening | ORAL | Status: DC | PRN

## 2017-05-09 MED ORDER — ESCITALOPRAM OXALATE 20 MG PO TABS
20.0000 mg | ORAL_TABLET | Freq: Every day | ORAL | Status: DC
  Administered 2017-05-09 – 2017-05-15 (×8): 20 mg via ORAL
  Filled 2017-05-09: qty 2
  Filled 2017-05-09 (×9): qty 1

## 2017-05-09 NOTE — Tx Team (Signed)
Initial Treatment Plan 05/09/2017 1:37 AM Marvin Crawford ONG:[id number]    PATIENT STRESSORS: Medication change or noncompliance Substance abuse   PATIENT STRENGTHS: Financial means General fund of knowledge Supportive family/friends Work skills   PATIENT IDENTIFIED PROBLEMS: Psychosis  Suicidal ideation  Depression  "Take medicines"  "I want the voices to go away"             DISCHARGE CRITERIA:  Motivation to continue treatment in a less acute level of care Verbal commitment to aftercare and medication compliance  PRELIMINARY DISCHARGE PLAN: Outpatient therapy Medication management  PATIENT/FAMILY INVOLVEMENT: This treatment plan has been presented to and reviewed with the patient, Marvin Crawford.  The patient and family have been given the opportunity to ask questions and make suggestions.  Levin Bacon, RN 05/09/2017, 1:37 AM

## 2017-05-09 NOTE — Progress Notes (Signed)
Report received from admitting RN.  Pt requests medication for sleep.  Medication administered per order.  He verbally contracts for safety.  Will continue to monitor and assess.

## 2017-05-09 NOTE — Plan of Care (Signed)
Problem: Activity: Goal: Interest or engagement in activities will improve Outcome: Not Progressing Patient has remained in his room this shift, except for meals. Goal: Sleeping patterns will improve Outcome: Not Progressing Napping through out the day.  Problem: Coping: Goal: Ability to demonstrate self-control will improve Outcome: Progressing Patient has remained appropriate this shift.  Problem: Safety: Goal: Periods of time without injury will increase Outcome: Progressing Patient has remained free from injury.

## 2017-05-09 NOTE — Progress Notes (Signed)
Recreation Therapy Notes  Date: 05/09/17 Time: 1000 Location: 500 Hall Dayroom  Group Topic: Goal Setting  Goal Area(s) Addresses:  Patient will be able to identify at least 3 life goals.  Patient will be able to identify benefit of investing in life goals.  Patient will be able to identify benefit of setting life goals.   Intervention: Life goals worksheet, pencils  Activity: Life Goals.  Patients were given a worksheet with six categories ( family, friends, work/school, spirituality, body and mental health).  For each category, patients were to identify what they were doing well, where they needed to improve and set a goal for how to make that improvement.  Education:  Discharge Planning, Coping Skills, Goal Setting   Education Outcome: Acknowledges Education/In Group Clarification Provided/Needs Additional Education  Clinical Observations: Pt did not attend group.   Caroll Rancher, LRT/CTRS         Caroll Rancher A 05/09/2017 12:18 PM

## 2017-05-09 NOTE — Progress Notes (Signed)
Data. Patient denies SI/HI/AVH. Patient appears paranoid and is cautious with interaction and watchful. Verbally contracts for safety on the unit and to come to staff before acting of any self harm thoughts/feelings/voices.  Patient spent most of the shift in his to room and only came out to get his meds and his food, and then went back to his room to eat. Affect is flat and does not brighten with interaction. Patient did not complete a self assessment. Action. Emotional support and encouragement offered. Education provided on medication, indications and side effect. Q 15 minute checks done for safety. Response. Safety on the unit maintained through 15 minute checks.  Medications taken as prescribed. Remained calm through out shift.
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
## 2017-05-09 NOTE — Progress Notes (Signed)
Marvin Crawford is a 21 year old male being admitted voluntarily to [facility] from [facility].  He came in with his mother for suicidal ideation with plan to be hit by a car.  He had recent hospitalization at [facility] for stepping out of a vehicle when [place] [address]. He is reporting hearing auditory hallucinations telling him he could do better and visual hallucinations of black and white images.   He reported using cannabis and having behavior changes with the cannabis.  He is diagnosed with Major Depressive Disorder with psychosis.  During [facility] admission, he continues to report suicidal ideation and is able to contract for safety on the unit.  He reports hearing voices telling him to hurt himself.  He denies HI.  He denies any pain or discomfort and appears to be in no physical distress.  Oriented him to the unit.  Admission paperwork completed and signed.  Belongings searched and no locker needed on admission.  Skin assessment completed and no skin issues noted.  Q 15 minute checks initiated for safety.  We will monitor the progress towards his goals.

## 2017-05-09 NOTE — Progress Notes (Signed)
D: Pt was in bed in his room upon initial approach.  Pt presents with depressed affect and mood.  Forwards little information.  Describes his day as "good."  Goal is to "get out of here."  Pt denies SI/HI, denies hallucinations, denies pain.  Pt has been isolative to room tonight and he did not attend evening group.   A: Introduced self to pt.  Actively listened to pt and offered support and encouragement. Medication offered per order.  PRN medication administered for sleep.  Pt remains on 1:1 for safety.   R: Pt is safe on the unit.  Pt refused scheduled Seroquel.  Pt verbally contracts for safety.  Will continue to monitor 1:1 for safety.

## 2017-05-09 NOTE — Social Work (Signed)
Referred to [facility], is [place] resident.  Anne Cunningham, LCSW Lead Clinical Social Worker Phone:  [phone]  

## 2017-05-09 NOTE — Progress Notes (Signed)
Psychoeducational Group Note  Date:  05/09/2017 Time:  2209  Group Topic/Focus:  Wrap-Up Group:   The focus of this group is to help patients review their daily goal of treatment and discuss progress on daily workbooks.  Participation Level: Did Not Attend  Participation Quality:  Not Applicable  Affect:  Not Applicable  Cognitive:  Not Applicable  Insight:  Not Applicable  Engagement in Group: Not Applicable  Additional Comments:  The patient did not attend group this evening.   Kody Vigil S 05/09/2017, 10:09 PM
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
## 2017-05-09 NOTE — BHH Group Notes (Signed)
LCSW Group Therapy Note  05/09/2017 1:15pm  Type of Therapy/Topic:  Group Therapy:  Balance in Life  Participation Level:  Minimal  Description of Group:    This group will address the concept of balance and how it feels and looks when one is unbalanced. Patients will be encouraged to process areas in their lives that are out of balance and identify reasons for remaining unbalanced. Facilitators will guide patients in utilizing problem-solving interventions to address and correct the stressor making their life unbalanced. Understanding and applying boundaries will be explored and addressed for obtaining and maintaining a balanced life. Patients will be encouraged to explore ways to assertively make their unbalanced needs known to significant others in their lives, using other group members and facilitator for support and feedback.  Therapeutic Goals: 1. Patient will identify two or more emotions or situations they have that consume much of in their lives. 2. Patient will identify signs/triggers that life has become out of balance:  3. Patient will identify two ways to set boundaries in order to achieve balance in their lives:  4. Patient will demonstrate ability to communicate their needs through discussion and/or role plays  Summary of Patient Progress:   Marvin Crawford attended group but was pulled out to see the doctor.  He did not return to the group.  He could not identify an emotion and where he feels that emotion.   Therapeutic Modalities:   Cognitive Behavioral Therapy Solution-Focused Therapy Assertiveness Training  Carlynn Herald Work 05/09/2017 1:23 PM

## 2017-05-09 NOTE — Progress Notes (Signed)
Recreation Therapy Notes  INPATIENT RECREATION THERAPY ASSESSMENT  Patient Details Name: Marvin Crawford MRN: [id number] DOB: June 15, 1996 Today's Date: 05/09/2017  Patient Stressors: Family, Friends, Work  Pt stated he was here for depression and being anxious.  Coping Skills:   Isolate, Arguments, Substance Abuse, Avoidance, Self-Injury, Exercise, Talking, Music, Sports  Pt stated he was drinking and smoking.  Personal Challenges: Anger, Communication, Concentration, Decision-Making, Expressing Yourself, Problem-Solving, Relationships, Self-Esteem/Confidence, Social Interaction, Stress Management, Substance Abuse, Time Management, Trusting Others, Work Nutritional therapist (2+):  Music - Listen, Individual - TV, Community - Other (Comment) (Work)  Biochemist, clinical Resources:  Yes  Community Resources:  Research scientist (physical sciences), Tree surgeon  Current Use: Yes  Patient Strengths:  Positive person; STD negative  Patient Identified Areas of Improvement:  Moving on   Current Recreation Participation:  1-2 times a week  Patient Goal for Hospitalization:  "Find the right medication"  [place] of Residence:  [place] of Residence:  [place]  Current SI (including self-harm):  Yes (Rated a 3; contrats for safety)  Current HI:  No  Consent to Intern Participation: N/A   Caroll Rancher, LRT/CTRS  Lillia Abed, Conny Situ A 05/09/2017, 2:02 PM

## 2017-05-09 NOTE — Tx Team (Signed)
Interdisciplinary Treatment and Diagnostic Plan Update  05/11/2017 Time of Session: 3:12 PM  Marvin Crawford MRN: [id number]  Principal Diagnosis: Schizo-affective schizophrenia, chronic condition with acute exacerbation (HCC)  Secondary Diagnoses: Principal Problem:   Schizo-affective schizophrenia, chronic condition with acute exacerbation (HCC)   Current Medications:  Current Facility-Administered Medications  Medication Dose Route Frequency Provider Last Rate Last Dose  . acetaminophen (TYLENOL) tablet 650 mg  650 mg Oral Q6H PRN Kerry Hough, PA-C      . alum & mag hydroxide-simeth (MAALOX/MYLANTA) 200-200-20 MG/5ML suspension 30 mL  30 mL Oral Q4H PRN Kerry Hough, PA-C      . escitalopram (LEXAPRO) tablet 20 mg  20 mg Oral Daily Donell Sievert E, PA-C   20 mg at 05/11/17 4098  . famotidine (PEPCID) tablet 20 mg  20 mg Oral BID Kerry Hough, PA-C   20 mg at 05/11/17 1191  . hydrOXYzine (ATARAX/VISTARIL) tablet 25 mg  25 mg Oral Q6H PRN Kerry Hough, PA-C   25 mg at 05/10/17 2119  . magnesium hydroxide (MILK OF MAGNESIA) suspension 30 mL  30 mL Oral Daily PRN Donell Sievert E, PA-C      . nicotine (NICODERM CQ - dosed in mg/24 hours) patch 21 mg  21 mg Transdermal Daily Cobos, Rockey Situ, MD   21 mg at 05/10/17 0800  . QUEtiapine (SEROQUEL) tablet 25 mg  25 mg Oral BID Cobos, Rockey Situ, MD   25 mg at 05/11/17 4782  . QUEtiapine (SEROQUEL) tablet 50 mg  50 mg Oral QHS Cobos, Rockey Situ, MD   50 mg at 05/10/17 2120  . traZODone (DESYREL) tablet 50 mg  50 mg Oral QHS PRN Cobos, Rockey Situ, MD   50 mg at 05/10/17 2119    PTA Medications: Prescriptions Prior to Admission  Medication Sig Dispense Refill Last Dose  . amoxicillin-clavulanate (AUGMENTIN) 875-125 MG tablet Take 1 tablet by mouth 2 (two) times daily. One po bid x 7 days (Patient not taking: Reported on 05/08/2017) 14 tablet 0 Completed Course at Unknown time  . ARIPiprazole (ABILIFY) 15 MG tablet Take 1 tablet  (15 mg total) by mouth 2 (two) times daily. 30 tablet 0 05/07/2017 at Unknown time  . benztropine (COGENTIN) 0.5 MG tablet Take 1 tablet (0.5 mg total) by mouth at bedtime. (Patient not taking: Reported on 03/15/2016) 30 tablet 0 Not Taking at Unknown time  . docusate sodium (COLACE) 100 MG capsule Take 1 capsule (100 mg total) by mouth every 12 (twelve) hours. (Patient not taking: Reported on 05/08/2017) 60 capsule 0 Completed Course at Unknown time  . escitalopram (LEXAPRO) 20 MG tablet Take 1 tablet (20 mg total) by mouth daily. 30 tablet 0 Past Week at Unknown time  . famotidine (PEPCID) 20 MG tablet Take 1 tablet (20 mg total) by mouth 2 (two) times daily. 60 tablet 0 Past Week at Unknown time  . hydrocortisone (ANUSOL-HC) 2.5 % rectal cream Apply rectally 2 times daily 28 g 1 Past Month at Unknown time  . hydrOXYzine (ATARAX/VISTARIL) 25 MG tablet Take 1 tablet (25 mg total) by mouth daily as needed for anxiety. (Patient not taking: Reported on 03/15/2016) 30 tablet 0 Not Taking at Unknown time  . ibuprofen (ADVIL,MOTRIN) 600 MG tablet Take 1 tablet (600 mg total) by mouth every 6 (six) hours as needed. (Patient not taking: Reported on 05/08/2017) 30 tablet 0 Completed Course at Unknown time  . ibuprofen (ADVIL,MOTRIN) 800 MG tablet Take 1 tablet (800 mg  total) by mouth 3 (three) times daily. (Patient not taking: Reported on 05/08/2017) 21 tablet 0 Completed Course at Unknown time  . traZODone (DESYREL) 150 MG tablet Take 1 tablet (150 mg total) by mouth at bedtime as needed for sleep. (Patient not taking: Reported on 03/15/2016) 30 tablet 0 Not Taking at Unknown time    Treatment Modalities: Medication Management, Group therapy, Case management,  1 to 1 session with clinician, Psychoeducation, Recreational therapy.  Patient Stressors: Medication change or noncompliance Substance abuse  Patient Strengths: Astronomer fund of knowledge Supportive family/friends Work Librarian, academic for Primary Diagnosis: Schizo-affective schizophrenia, chronic condition with acute exacerbation (HCC) Long Term Goal(s): Improvement in symptoms so as ready for discharge  Short Term Goals: Ability to identify changes in lifestyle to reduce recurrence of condition will improve Ability to verbalize feelings will improve Ability to disclose and discuss suicidal ideas Compliance with prescribed medications will improve Ability to verbalize feelings will improve Ability to disclose and discuss suicidal ideas Compliance with prescribed medications will improve  Medication Management: Evaluate patient's response, side effects, and tolerance of medication regimen.  Therapeutic Interventions: 1 to 1 sessions, Unit Group sessions and Medication administration.  Evaluation of Outcomes: Progressing  Physician Treatment Plan for Secondary Diagnosis: Principal Problem:   Schizo-affective schizophrenia, chronic condition with acute exacerbation (HCC)  Long Term Goal(s): Improvement in symptoms so as ready for discharge  Short Term Goals: Ability to identify changes in lifestyle to reduce recurrence of condition will improve Ability to verbalize feelings will improve Ability to disclose and discuss suicidal ideas Compliance with prescribed medications will improve Ability to verbalize feelings will improve Ability to disclose and discuss suicidal ideas Compliance with prescribed medications will improve  Medication Management: Evaluate patient's response, side effects, and tolerance of medication regimen.  Therapeutic Interventions: 1 to 1 sessions, Unit Group sessions and Medication administration.  Evaluation of Outcomes: Progressing   RN Treatment Plan for Primary Diagnosis: Schizo-affective schizophrenia, chronic condition with acute exacerbation (HCC) Long Term Goal(s): Knowledge of disease and therapeutic regimen to maintain health will improve  Short Term Goals:  Ability to remain free from injury will improve, Ability to verbalize feelings will improve, Ability to identify and develop effective coping behaviors will improve and Compliance with prescribed medications will improve  Medication Management: RN will administer medications as ordered by provider, will assess and evaluate patient's response and provide education to patient for prescribed medication. RN will report any adverse and/or side effects to prescribing provider.  Therapeutic Interventions: 1 on 1 counseling sessions, Psychoeducation, Medication administration, Evaluate responses to treatment, Monitor vital signs and CBGs as ordered, Perform/monitor CIWA, COWS, AIMS and Fall Risk screenings as ordered, Perform wound care treatments as ordered.  Evaluation of Outcomes: Progressing    Recreational Therapy Treatment Plan for Primary Diagnosis: Schizo-affective schizophrenia, chronic condition with acute exacerbation (HCC) Long Term Goal(s): Patient will participate in recreation therapy treatment in at least 2 group sessions without prompting from LRT  Short Term Goals: Patient will be able to identify at least 5 coping skills for admitting diagnosis by conclusion of recreation therapy treatment  Treatment Modalities: Group and Pet Therapy  Therapeutic Interventions: Psychoeducation  Evaluation of Outcomes: Progressing   LCSW Treatment Plan for Primary Diagnosis: Schizo-affective schizophrenia, chronic condition with acute exacerbation (HCC) Long Term Goal(s): Safe transition to appropriate next level of care at discharge, Engage patient in therapeutic group addressing interpersonal concerns.  Short Term Goals: Engage patient in aftercare planning with  referrals and resources, Increase social support, Facilitate acceptance of mental health diagnosis and concerns, Facilitate patient progression through stages of change regarding substance use diagnoses and concerns, Identify triggers  associated with mental health/substance abuse issues and Increase skills for wellness and recovery  Therapeutic Interventions: Assess for all discharge needs, 1 to 1 time with Social worker, Explore available resources and support systems, Assess for adequacy in community support network, Educate family and significant other(s) on suicide prevention, Complete Psychosocial Assessment, Interpersonal group therapy.  Evaluation of Outcomes: Progressing   Progress in Treatment: Attending groups: Yes Participating in groups: Yes Taking medication as prescribed: Yes Toleration of medication: Yes, no side effects reported at this time Family/Significant other contact made: Valentino Hue [name] [phone] (Mother) Patient understands diagnosis: No, limited insight Discussing patient identified problems/goals with staff: Yes Medical problems stabilized or resolved: Yes Denies suicidal/homicidal ideation: Yes Issues/concerns per patient self-inventory: None Other: N/A  New problem(s) identified: None identified at this time.   New Short Term/Long Term Goal(s): Pt wants help with "Being more social"   Discharge Plan or Barriers: Pt will return home with mother  Reason for Continuation of Hospitalization: Depression Hallucinations Medication stabilization Suicidal ideation Withdrawal symptoms  Estimated Length of Stay: 05/14/17  Attendees: Patient: Marvin Crawford  05/11/2017  3:12 PM  Physician: Nehemiah Massed, MD 05/11/2017  3:12 PM  Nursing: Erskine Squibb RN 05/11/2017  3:12 PM  RN Care Manager:  05/11/2017  3:12 PM  Social Worker: Santa Genera, LCSW;  05/11/2017  3:12 PM  Recreational Therapist:  Huston Foley LRT 05/11/2017  3:12 PM  Other:  05/11/2017  3:12 PM  Other:  05/11/2017  3:12 PM  Other: 05/11/2017  3:12 PM    Scribe for Treatment Team: Sallee Lange, LCSW 05/11/2017 3:12 PM

## 2017-05-09 NOTE — Social Work (Signed)
Referred to [facility], is [place] resident.  Maxden Naji, LCSW Lead Clinical Social Worker Phone:  [phone]  

## 2017-05-09 NOTE — ED Notes (Signed)
[facility] at bedside to take patient

## 2017-05-09 NOTE — [facility] Suicide Risk Assessment (Signed)
[facility] INPATIENT:  Family/Significant Other Suicide Prevention Education  Suicide Prevention Education:  Education Completed; Marvin Crawford [phone] (mother) has been identified by the patient as the family member/significant other with whom the patient will be residing, and identified as the person(s) who will aid the patient in the event of a mental health crisis (suicidal ideations/suicide attempt).  With written consent from the patient, the family member/significant other has been provided the following suicide prevention education, prior to the and/or following the discharge of the patient.  The suicide prevention education provided includes the following:  Suicide risk factors  Suicide prevention and interventions  National Suicide Hotline telephone number  [facility] assessment telephone number  [facility] Emergency Assistance 911  [facility] and/or Residential Mobile Crisis Unit telephone number  Request made of family/significant other to:  Remove weapons (e.g., guns, rifles, knives), all items previously/currently identified as safety concern.    Remove drugs/medications (over-the-counter, prescriptions, illicit drugs), all items previously/currently identified as a safety concern.  The family member/significant other verbalizes understanding of the suicide prevention education information provided.  The family member/significant other agrees to remove the items of safety concern listed above.  Marvin Crawford 05/09/2017, 10:17 AM

## 2017-05-09 NOTE — H&P (Signed)
Psychiatric Admission Assessment Adult  Patient Identification: Marvin Crawford MRN:  [id number] Date of Evaluation:  05/09/2017 Chief Complaint:  mdd with psych features Principal Diagnosis: Schizo-affective schizophrenia, chronic condition with acute exacerbation [facility]) Diagnosis:   Patient Active Problem List   Diagnosis Date Noted  . Schizo-affective schizophrenia, chronic condition with acute exacerbation ([place]) [F25.8] 05/09/2017  . 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 Stuttering [F80.81] 08/09/2015  . Cannabis use disorder, moderate, dependence ([place]) [F12.20] 08/09/2015  . MDD (major depressive disorder), recurrent, severe, with psychosis ([place]) [F33.3] 02/16/2013   History of Present Illness: Per assessment-  Marvin Crawford is a 21 year old male being admitted voluntarily to [facility] from [facility].  He came in with his mother for suicidal ideation with plan to be hit by a car.  He had recent hospitalization at [facility] for stepping out of a vehicle when [place] on [address]. He is reporting hearing auditory hallucinations telling him he could do better and visual hallucinations of black and white images.   He reported using cannabis and having behavior changes with the cannabis.  He is diagnosed with Major Depressive Disorder with psychosis.  During [facility] admission, he continues to report suicidal ideation and is able to contract for safety on the unit.  He reports hearing voices telling him to hurt himself.  He denies HI.  He denies any pain or discomfort and appears to be in no physical distress.  Oriented him to the unit.  Admission paperwork completed and signed.  Belongings searched and no locker needed on admission.  Skin assessment completed and no skin issues noted.  Q 15 minute checks initiated for safety.  We will monitor the progress towards his goals.   On Evaluation:Marvin Crawford is awake, alert.  seen attending group session. Patient reports he is unsure why he is here. States " my  mother just dropped me off."  Patient reports he has been unable to sleep well during the night. However reports he goes to sleep about midnight. Denies racing thoughts or auditory hallucinations. Reports taken trazodone which is helpful. Patient currently denies suicidal ideations or auditory or visual hallucination. Noted during this assessment patient is responding "yes and no" only to questions. Support, encouragement and reassurance was provided.    Associated Signs/Symptoms: Depression Symptoms:  depressed mood, difficulty concentrating, disturbed sleep, (Hypo) Manic Symptoms:  Distractibility, Hallucinations, Impulsivity, Anxiety Symptoms:  Excessive Worry, Psychotic Symptoms:  Hallucinations: Auditory PTSD Symptoms: Avoidance:  Decreased Interest/Participation Total Time spent with patient: 30 minutes  Past Psychiatric History:   Is the patient at risk to self? Yes.    Has the patient been a risk to self in the past 6 months? Yes.    Has the patient been a risk to self within the distant past? Yes.    Is the patient a risk to others? No.  Has the patient been a risk to others in the past 6 months? No.  Has the patient been a risk to others within the distant past? No.   Prior Inpatient Therapy:   Prior Outpatient Therapy:    Alcohol Screening: 1. How often do you have a drink containing alcohol?: Monthly or less 2. How many drinks containing alcohol do you have on a typical day when you are drinking?: 1 or 2 3. How often do you have six or more drinks on one occasion?: Never Preliminary Score: 0 9. Have you or someone else been injured as a result of your drinking?: No 10. Has a relative  or friend or a doctor or another health worker been concerned about your drinking or suggested you cut down?: No Alcohol Use Disorder Identification Test Final Score (AUDIT): 1 Brief Intervention: AUDIT score less than 7 or less-screening does not suggest unhealthy drinking-brief  intervention not indicated Substance Abuse History in the last 12 months:  No. Consequences of Substance Abuse: NA Previous Psychotropic Medications: NO Psychological Evaluations: no Past Medical History:  Past Medical History:  Diagnosis Date  . ADD (attention deficit disorder)   . Anxiety   . Depression   . Environmental allergies    History reviewed. No pertinent surgical history. Family History:  Family History  Problem Relation Age of Onset  . Hypertension Mother   . Hypertension Father   . Mental illness Neg Hx    Family Psychiatric  History: unknown Tobacco Screening: Have you used any form of tobacco in the last 30 days? (Cigarettes, Smokeless Tobacco, Cigars, and/or Pipes): Yes Tobacco use, Select all that apply: 5 or more cigarettes per day Are you interested in Tobacco Cessation Medications?: Yes, will notify MD for an order Counseled patient on smoking cessation including recognizing danger situations, developing coping skills and basic information about quitting provided: Refused/Declined practical counseling Social History:  History  Alcohol Use No     History  Drug Use  . Types: Other-see comments, Marijuana    Comment: oil smoked in pipe calls "D[employer]", friends buy for him    Additional Social History:                           Allergies:  No Known Allergies Lab Results:  Results for orders placed or performed during the hospital encounter of 05/08/17 (from the past 48 hour(s))  Rapid urine drug screen (hospital performed)     Status: None   Collection Time: 05/08/17  9:51 PM  Result Value Ref Range   Opiates NONE DETECTED NONE DETECTED   Cocaine NONE DETECTED NONE DETECTED   Benzodiazepines NONE DETECTED NONE DETECTED   Amphetamines NONE DETECTED NONE DETECTED   Tetrahydrocannabinol NONE DETECTED NONE DETECTED   Barbiturates NONE DETECTED NONE DETECTED    Comment:        DRUG SCREEN FOR MEDICAL PURPOSES ONLY.  IF CONFIRMATION IS NEEDED FOR  ANY PURPOSE, NOTIFY LAB WITHIN 5 DAYS.        LOWEST DETECTABLE LIMITS FOR URINE DRUG SCREEN Drug Class       Cutoff (ng/mL) Amphetamine      1000 Barbiturate      200 Benzodiazepine   157 Tricyclics       262 Opiates          300 Cocaine          300 THC              50   Comprehensive metabolic panel     Status: None   Collection Time: 05/08/17 10:04 PM  Result Value Ref Range   Sodium 140 135 - 145 mmol/L   Potassium 3.9 3.5 - 5.1 mmol/L   Chloride 107 101 - 111 mmol/L   CO2 26 22 - 32 mmol/L   Glucose, Bld 93 65 - 99 mg/dL   BUN 8 6 - 20 mg/dL   Creatinine, Ser 1.03 0.61 - 1.24 mg/dL   Calcium 9.7 8.9 - 10.3 mg/dL   Total Protein 7.2 6.5 - 8.1 g/dL   Albumin 4.4 3.5 - 5.0 g/dL   AST 25 15 -  41 U/L   ALT 27 17 - 63 U/L   Alkaline Phosphatase 73 38 - 126 U/L   Total Bilirubin 0.5 0.3 - 1.2 mg/dL   GFR calc non Af Amer >60 >60 mL/min   GFR calc Af Amer >60 >60 mL/min    Comment: (NOTE) The eGFR has been calculated using the CKD EPI equation. This calculation has not been validated in all clinical situations. eGFR's persistently <60 mL/min signify possible Chronic Kidney Disease.    Anion gap 7 5 - 15  Ethanol     Status: None   Collection Time: 05/08/17 10:04 PM  Result Value Ref Range   Alcohol, Ethyl (B) <10 <10 mg/dL    Comment:        LOWEST DETECTABLE LIMIT FOR SERUM ALCOHOL IS 10 mg/dL FOR MEDICAL PURPOSES ONLY Please note change in reference range.   Salicylate level     Status: None   Collection Time: 05/08/17 10:04 PM  Result Value Ref Range   Salicylate Lvl <2.7 2.8 - 30.0 mg/dL  Acetaminophen level     Status: Abnormal   Collection Time: 05/08/17 10:04 PM  Result Value Ref Range   Acetaminophen (Tylenol), Serum <10 (L) 10 - 30 ug/mL    Comment:        THERAPEUTIC CONCENTRATIONS VARY SIGNIFICANTLY. A RANGE OF 10-30 ug/mL MAY BE AN EFFECTIVE CONCENTRATION FOR MANY PATIENTS. HOWEVER, SOME ARE BEST TREATED AT CONCENTRATIONS OUTSIDE  THIS RANGE. ACETAMINOPHEN CONCENTRATIONS >150 ug/mL AT 4 HOURS AFTER INGESTION AND >50 ug/mL AT 12 HOURS AFTER INGESTION ARE OFTEN ASSOCIATED WITH TOXIC REACTIONS.   cbc     Status: None   Collection Time: 05/08/17 10:04 PM  Result Value Ref Range   WBC 4.9 4.0 - 10.5 K/uL   RBC 4.84 4.22 - 5.81 MIL/uL   Hemoglobin 14.3 13.0 - 17.0 g/dL   HCT 40.8 39.0 - 52.0 %   MCV 84.3 78.0 - 100.0 fL   MCH 29.5 26.0 - 34.0 pg   MCHC 35.0 30.0 - 36.0 g/dL   RDW 12.8 11.5 - 15.5 %   Platelets 250 150 - 400 K/uL    Blood Alcohol level:  Lab Results  Component Value Date   ETH <10 05/08/2017   ETH <5 25/36/6440    Metabolic Disorder Labs:  Lab Results  Component Value Date   HGBA1C 5.7 (H) 09/11/2015   MPG 117 09/11/2015   MPG 111 02/17/2013   Lab Results  Component Value Date   PROLACTIN 2.1 (L) 09/11/2015   Lab Results  Component Value Date   CHOL 131 09/11/2015   TRIG 58 09/11/2015   HDL 40 (L) 09/11/2015   CHOLHDL 3.3 09/11/2015   VLDL 12 09/11/2015   LDLCALC 79 09/11/2015   LDLCALC 100 04/21/2015    Current Medications: Current Facility-Administered Medications  Medication Dose Route Frequency Provider Last Rate Last Dose  . acetaminophen (TYLENOL) tablet 650 mg  650 mg Oral Q6H PRN Laverle Hobby, PA-C      . alum & mag hydroxide-simeth (MAALOX/MYLANTA) 200-200-20 MG/5ML suspension 30 mL  30 mL Oral Q4H PRN Laverle Hobby, PA-C      . escitalopram (LEXAPRO) tablet 20 mg  20 mg Oral Daily Patriciaann Clan E, PA-C   20 mg at 05/09/17 0831  . famotidine (PEPCID) tablet 20 mg  20 mg Oral BID Laverle Hobby, PA-C   20 mg at 05/09/17 0831  . hydrOXYzine (ATARAX/VISTARIL) tablet 25 mg  25 mg Oral Q6H PRN  Laverle Hobby, PA-C      . magnesium hydroxide (MILK OF MAGNESIA) suspension 30 mL  30 mL Oral Daily PRN Patriciaann Clan E, PA-C      . nicotine (NICODERM CQ - dosed in mg/24 hours) patch 21 mg  21 mg Transdermal Daily Guida Asman, Myer Peer, MD   21 mg at 05/09/17 0831   . QUEtiapine (SEROQUEL XR) 24 hr tablet 50 mg  50 mg Oral Daily Patriciaann Clan E, PA-C   50 mg at 05/09/17 0831  . traZODone (DESYREL) tablet 100 mg  100 mg Oral QHS,MR X 1 Laverle Hobby, PA-C   100 mg at 05/09/17 0102   PTA Medications: Prescriptions Prior to Admission  Medication Sig Dispense Refill Last Dose  . amoxicillin-clavulanate (AUGMENTIN) 875-125 MG tablet Take 1 tablet by mouth 2 (two) times daily. One po bid x 7 days (Patient not taking: Reported on 05/08/2017) 14 tablet 0 Completed Course at Unknown time  . ARIPiprazole (ABILIFY) 15 MG tablet Take 1 tablet (15 mg total) by mouth 2 (two) times daily. 30 tablet 0 05/07/2017 at Unknown time  . benztropine (COGENTIN) 0.5 MG tablet Take 1 tablet (0.5 mg total) by mouth at bedtime. (Patient not taking: Reported on 03/15/2016) 30 tablet 0 Not Taking at Unknown time  . docusate sodium (COLACE) 100 MG capsule Take 1 capsule (100 mg total) by mouth every 12 (twelve) hours. (Patient not taking: Reported on 05/08/2017) 60 capsule 0 Completed Course at Unknown time  . escitalopram (LEXAPRO) 20 MG tablet Take 1 tablet (20 mg total) by mouth daily. 30 tablet 0 Past Week at Unknown time  . famotidine (PEPCID) 20 MG tablet Take 1 tablet (20 mg total) by mouth 2 (two) times daily. 60 tablet 0 Past Week at Unknown time  . hydrocortisone (ANUSOL-HC) 2.5 % rectal cream Apply rectally 2 times daily 28 g 1 Past Month at Unknown time  . hydrOXYzine (ATARAX/VISTARIL) 25 MG tablet Take 1 tablet (25 mg total) by mouth daily as needed for anxiety. (Patient not taking: Reported on 03/15/2016) 30 tablet 0 Not Taking at Unknown time  . ibuprofen (ADVIL,MOTRIN) 600 MG tablet Take 1 tablet (600 mg total) by mouth every 6 (six) hours as needed. (Patient not taking: Reported on 05/08/2017) 30 tablet 0 Completed Course at Unknown time  . ibuprofen (ADVIL,MOTRIN) 800 MG tablet Take 1 tablet (800 mg total) by mouth 3 (three) times daily. (Patient not taking: Reported on  05/08/2017) 21 tablet 0 Completed Course at Unknown time  . traZODone (DESYREL) 150 MG tablet Take 1 tablet (150 mg total) by mouth at bedtime as needed for sleep. (Patient not taking: Reported on 03/15/2016) 30 tablet 0 Not Taking at Unknown time    Musculoskeletal: Strength & Muscle Tone: within normal limits Gait & Station: normal Patient leans: N/A  Psychiatric Specialty Exam: Physical Exam  ROS  Blood pressure (!) 141/84, pulse 89, temperature 98.7 F (37.1 C), temperature source Oral, resp. rate 16, height '6\' 1"'  (1.854 m), weight 105.2 kg (232 lb).Body mass index is 30.61 kg/m.  General Appearance: Casual  Eye Contact:  Fair  Speech:  Clear and Coherent  Volume:  Normal  Mood:  Depressed and Dysphoric  Affect:  Depressed  Thought Process:  Linear and Descriptions of Associations: Intact  Orientation:  Other:  Person and Date  Thought Content:  Hallucinations: Auditory  Suicidal Thoughts:  No  Homicidal Thoughts:  No  Memory:  Immediate;   Fair Recent;   Fair Remote;  Fair  Judgement:  Fair  Insight:  Lacking and Shallow  Psychomotor Activity:  Normal  Concentration:  Concentration: Poor  Recall:  [place] of Knowledge:  UTA  Language:  Fair  Akathisia:  No  Handed:  Right  AIMS (if indicated):     Assets:  Communication Skills Desire for Improvement Social Support Talents/Skills  ADL's:  Intact  Cognition:  WNL and Impaired,  Mild noted as borderline intellectual fuctioning  Sleep:  Number of Hours: 3.75    Treatment Plan Summary: Daily contact with patient to assess and evaluate symptoms and progress in treatment and Medication management   Continue with Seroquel 50 mg, Lexapro 20 mg mgs for mood stabilization. Continue with Trazodone 100 mg for insomnia  Will continue to monitor vitals ,medication compliance and treatment side effects while patient is here.  Reviewed labs BAL - , UDS -  CSW will start working on disposition.  Patient to participate  in therapeutic milieu  Observation Level/Precautions:  15 minute checks  Laboratory:  CBC Chemistry Profile HCG UDS  Psychotherapy:  Individual and group session  Medications:  See SRA by MD  Consultations:  CSW and Psychiatry  Discharge Concerns: Safety, stabilization, and risk of access to medication and medication stabilization    Estimated LOS:5-7 days  Other:     Physician Treatment Plan for Primary Diagnosis: Schizo-affective schizophrenia, chronic condition with acute exacerbation ([place]) Long Term Goal(s): Improvement in symptoms so as ready for discharge  Short Term Goals: Ability to identify changes in lifestyle to reduce recurrence of condition will improve, Ability to verbalize feelings will improve, Ability to disclose and discuss suicidal ideas and Compliance with prescribed medications will improve  Physician Treatment Plan for Secondary Diagnosis: Principal Problem:   Schizo-affective schizophrenia, chronic condition with acute exacerbation ([place])  Long Term Goal(s): Improvement in symptoms so as ready for discharge  Short Term Goals: Ability to verbalize feelings will improve, Ability to disclose and discuss suicidal ideas and Compliance with prescribed medications will improve  I certify that inpatient services furnished can reasonably be expected to improve the patient's condition.    Derrill Center, NP 10/3/20181:42 PM   Case reviewed with NP and patient seen by me  Agree with NP assessment  Patient is 21 year old male, lives with mother.  Patient presents as limited historian at this time, denying symptoms, answering most questions with " I don't know" or monosyllables. Chart notes indicate patient hd reported suicidal ideations with thoughts of getting hit by a car.  He had also reported depression, neuro-vegetative symptoms,  auditory hallucinations and visual hallucinations. At this time patient denies having had suicidal ideations and denies recent  hallucinations, but does state he has been feeling " sick", " awful". Chart notes indicate history of cannabis abuse, with behavioral changes associated with drug use . Admission BAL <10, UDS negative   Patient has history of a recent psychiatric admission for jumping out of a car and  prior admission to psychiatric unit in 2017 for psychotic symptoms- at the time was diagnosed with MDD with Psychotic Features, Cannabis Use Disorder and Borderline Intellectual Functioning   Home meds - Abilify 15 mgrs BID, Lexapro 20 mgrs QDAY . It is unclear if he has been compliant with these recently.  Denies medical illnesses   Dx- MDD with psychotic features, consider Schizoaffective Disorder, Cannabis Abuse by History   Plan- Inpatient admission, has been started on Lexapro 20 mgrs QDAY abn on Seroquel 25 mgrs QAM and  50 mgrs QHS . Check Lipid Panel, Hgb A1C, Prolactin.

## 2017-05-09 NOTE — H&P (Signed)
Behavioral Health Medical Screening Exam  Marvin Crawford is an 21 y.o. male is accompanied with his mother, with whom he lives with and I believe still has guardianship. Marvin Crawford is a pleasant 21 y/o AAM with hx of intellectual disability and Schizoaffective d/o, endorsing non compliance with previous prescribed medications and depression with SI, plan, intent and means. He is also endorsing AVH. He is denying any acute ailments or concurrent co-morbid conditions.  Total Time spent with patient: 20 minutes  Psychiatric Specialty Exam: Physical Exam  Constitutional: He is oriented to person, place, and time. He appears well-developed and well-nourished. No distress.  HENT:  Head: Normocephalic.  Eyes: Pupils are equal, round, and reactive to light.  Respiratory: Effort normal and breath sounds normal. No respiratory distress.  GI: Soft.  Neurological: He is alert and oriented to person, place, and time. No cranial nerve deficit.  Skin: Skin is warm and dry. He is not diaphoretic.  Psychiatric: Judgment normal. His mood appears anxious. His affect is blunt. His speech is delayed. He is withdrawn and actively hallucinating. Cognition and memory are impaired. He exhibits a depressed mood. He expresses suicidal ideation. He expresses suicidal plans.    Review of Systems  Psychiatric/Behavioral: Positive for depression, hallucinations and suicidal ideas. The patient is nervous/anxious.   All other systems reviewed and are negative.   There were no vitals taken for this visit.There is no height or weight on file to calculate BMI.  General Appearance: Casual  Eye Contact:  Good  Speech:  Clear and Coherent  Volume:  Normal  Mood:  Depressed  Affect:  Congruent  Thought Process:  Goal Directed  Orientation:  Full (Time, Place, and Person)  Thought Content:  Hallucinations: Auditory  Suicidal Thoughts:  Yes.  with intent/plan  Homicidal Thoughts:  No  Memory:  Immediate;   Good  Judgement:   Impaired  Insight:  Lacking  Psychomotor Activity:  Negative  Concentration: Concentration: Fair  Recall:  Fair  Fund of Knowledge:Fair  Language: Negative  Akathisia:  Negative  Handed:  Right  AIMS (if indicated):     Assets:  Desire for Improvement  Sleep:       Musculoskeletal: Strength & Muscle Tone: within normal limits Gait & Station: normal Patient leans: N/A  There were no vitals taken for this visit.  Recommendations:  Based on my evaluation the patient appears to have an emergency medical condition for which I recommend the patient be transferred to the emergency department for further evaluation.  Kerry Hough, PA-C 05/09/2017, 12:29 AM

## 2017-05-10 DIAGNOSIS — F419 Anxiety disorder, unspecified: Secondary | ICD-10-CM

## 2017-05-10 DIAGNOSIS — F39 Unspecified mood [affective] disorder: Secondary | ICD-10-CM

## 2017-05-10 DIAGNOSIS — F1290 Cannabis use, unspecified, uncomplicated: Secondary | ICD-10-CM

## 2017-05-10 DIAGNOSIS — R451 Restlessness and agitation: Secondary | ICD-10-CM

## 2017-05-10 DIAGNOSIS — F258 Other schizoaffective disorders: Secondary | ICD-10-CM

## 2017-05-10 DIAGNOSIS — F17210 Nicotine dependence, cigarettes, uncomplicated: Secondary | ICD-10-CM

## 2017-05-10 LAB — HEMOGLOBIN A1C
HEMOGLOBIN A1C: 5.2 % (ref 4.8–5.6)
MEAN PLASMA GLUCOSE: 102.54 mg/dL

## 2017-05-10 LAB — LIPID PANEL
CHOL/HDL RATIO: 3.6 ratio
Cholesterol: 125 mg/dL (ref 0–200)
HDL: 35 mg/dL — AB (ref >40)
LDL CALC: 79 mg/dL (ref 0–99)
Triglycerides: 54 mg/dL (ref <150)
VLDL: 11 mg/dL (ref 0–40)

## 2017-05-10 LAB — TSH: TSH: 0.743 u[IU]/mL (ref 0.350–4.500)

## 2017-05-10 NOTE — Progress Notes (Signed)
D: Pt is resting in bed with eyes closed.  Respirations are even and unlabored.  No distress noted.  A: 1:1 staff remains with pt for safety.   R: Pt is safe.  

## 2017-05-10 NOTE — Progress Notes (Signed)
[facility] Post 1:1 Observation Documentation  For the first (8) hours following discontinuation of 1:1 precautions, a progress note entry by nursing staff should be documented at least every 2 hours, reflecting the patient's behavior, condition, mood, and conversation.  Use the progress notes for additional entries.  Time 1:1 discontinued:  0900  Patient's Behavior:  Pt. Pleasant, calm and cooperative.  Patient's Condition: stable   Patient's Conversation: Pt. Continues to forward little but responds appropriately to questions asked.    Carlisle Cater 05/10/2017, 7:28 PM

## 2017-05-10 NOTE — Progress Notes (Signed)
D:  Pt presents with depressed, anxious affect and mood.  Describes his day as "good" and reports goal is to "get out of here, start going to more groups."  He endorses SI without a plan at this time.  When asked what triggered his SI, pt states "when I couldn't get my clothes washed."  He reports his clothes have been washed but he still has SI.  Refuses to verbally contract for safety.  Pt denies HI, denies hallucinations, denies pain.  Pt has been visible in milieu interacting with peers and staff cautiously.  Pt attended evening group.    A: Introduced self to pt.  Actively listened to pt and offered support and encouragement. Medication administered per order.  PRN medication administered for anxiety and sleep.  Pt being monitored on continuous observation for safety.  R: Pt is safe on the unit.  Pt is compliant with medications.  Will continue to monitor and assess.

## 2017-05-10 NOTE — Progress Notes (Signed)
Adult Psychoeducational Group Note  Date:  05/10/2017 Time:  10:50 PM  Group Topic/Focus:  Wrap-Up Group:   The focus of this group is to help patients review their daily goal of treatment and discuss progress on daily workbooks.  Participation Level:  Active  Participation Quality:  Appropriate  Affect:  Appropriate  Cognitive:  Appropriate  Insight: Appropriate  Engagement in Group:  Engaged  Modes of Intervention:  Discussion  Additional Comments:  Pt stated his goals for today was to have a productive day. Pt stated that he accomplished his day for the most part. Pt rated his over all day a 10. Pt stated the goal for tomorrow is to attended all groups held.  Marvin Crawford 05/10/2017, 10:50 PM

## 2017-05-10 NOTE — Progress Notes (Signed)
D: Patient denies SI, HI or AVH during assessment but voiced to the MHT that he has intermittent, passive self harm thoughts but contracts for safety. Patient has a depressed mood, flat affect and is childlike with interaction.  Pt. Has been somewhat visible on the unit today, reported that his goal for the day was to attend more groups and stay out of his room.  Pt. Was discontinued from 1:1 per Dr. Jama Flavors and patient has been pleasant and cooperative.  A: Patient given emotional support from RN. Patient encouraged to come to staff with concerns and/or questions. Patient's medication routine continued. Patient's orders and plan of care reviewed.   R: Patient remains appropriate and cooperative. Will continue to monitor patient q15 minutes for safety.

## 2017-05-10 NOTE — Progress Notes (Signed)
[facility] Post 1:1 Observation Documentation  For the first (8) hours following discontinuation of 1:1 precautions, a progress note entry by nursing staff should be documented at least every 2 hours, reflecting the patient's behavior, condition, mood, and conversation.  Use the progress notes for additional entries.  Time 1:1 discontinued:  0900  Patient's Behavior:  Pt. Pleasant, calm and cooperative.  Patient's Condition:  Stable  Patient's Conversation: Pt. Forwards little but responds appropriately to questions asked and receives medication without difficulty.   Carlisle Cater 05/10/2017, 7:29 PM

## 2017-05-10 NOTE — Progress Notes (Signed)
[facility] Post 1:1 Observation Documentation  For the first (8) hours following discontinuation of 1:1 precautions, a progress note entry by nursing staff should be documented at least every 2 hours, reflecting the patient's behavior, condition, mood, and conversation.  Use the progress notes for additional entries.  Time 1:1 discontinued:  0900  Patient's Behavior:  Pt. Remains pleasant, calm and cooperative, sitting in his room.  Patient's Condition:  stable  Patient's Conversation:  Pt. Forwards little but responds appropriately to questions.  Carlisle Cater 05/10/2017, 7:27 PM

## 2017-05-10 NOTE — Progress Notes (Signed)
Recreation Therapy Notes  Date: 05/10/17 Time: 1000 Location: 500 Hall Dayroom  Group Topic: Communication, Team Building, Problem Solving  Goal Area(s) Addresses:  Patient will effectively work with peer towards shared goal.  Patient will identify skill used to make activity successful.  Patient will identify how skills used during activity can be used to reach post d/c goals.   Intervention: Rubber discs  Activity: Each patient was given a rubber disc with one extra disc on the nurse's station.  Patients were to work together to get to the end of the hall and back to their starting point.    Education: Pharmacist, community, Building control surveyor.   Education Outcome: Acknowledges education/In group clarification offered/Needs additional education.   Clinical Observations/Feedback:  Pt did not attend group.   Caroll Rancher, LRT/CTRS         Lillia Abed, Tino Ronan A 05/10/2017 11:06 AM

## 2017-05-10 NOTE — [facility] Group Notes (Signed)
LCSW Group Therapy 05/10/2017 1:15pm  Type of Therapy and Topic:  Group Therapy:  Change and Accountability  Participation Level:  Did Not Attend  Description of Group In this group, patients discussed power and accountability for change.  The group identified the challenges related to accountability and the difficulty of accepting the outcomes of negative behaviors.  Patients were encouraged to openly discuss a challenge/change they could take responsibility for.  Patients discussed the use of "change talk" and positive thinking as ways to support achievement of personal goals.  The group discussed ways to give support and empowerment to peers.  Therapeutic Goals: 1. Patients will state the relationship between personal power and accountability in the change process 2. Patients will identify the positive and negative consequences of a personal choice they have made 3. Patients will identify one challenge/choice they will take responsibility for making 4. Patients will discuss the role of "change talk" and the impact of positive thinking as it supports successful personal change 5. Patients will verbalize support and affirmation of change efforts in peers  Summary of Patient Progress:    Therapeutic Modalities Solution Focused Brief Therapy Motivational Interviewing Cognitive Behavioral Therapy  Carlynn Herald Work 05/10/2017 1:26 PM

## 2017-05-10 NOTE — Progress Notes (Signed)
[facility] MD Progress Note  05/10/2017 4:18 PM NAKAI POLLIO  MRN:  [id number]  Subjective: Willson reports, "My mom made me come here. I don't know, but I think she thought I was not safe or something. My mood is good".  Objective: Dahl is seen, chart reviewed. He is in his room, lying down in his bed. He presents with a restricted affect. Staff reports that he is visible on the unit, attending group sessions. He says his mood is good. He denies any issues. Tolerating medications well. No longer on the 1:1 supervision.  Principal Problem: Schizo-affective schizophrenia, chronic condition with acute exacerbation (HCC)  Diagnosis:   Patient Active Problem List   Diagnosis Date Noted  . Schizo-affective schizophrenia, chronic condition with acute exacerbation (HCC) [F25.8] 05/09/2017  . 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 Stuttering [F80.81] 08/09/2015  . Cannabis use disorder, moderate, dependence (HCC) [F12.20] 08/09/2015  . MDD (major depressive disorder), recurrent, severe, with psychosis (HCC) [F33.3] 02/16/2013   Total Time spent with patient: 25 minutes  Past Psychiatric History: Schizoaffective disorder, Cannabis use disorder.  Past Medical History:  Past Medical History:  Diagnosis Date  . ADD (attention deficit disorder)   . Anxiety   . Depression   . Environmental allergies    History reviewed. No pertinent surgical history. Family History:  Family History  Problem Relation Age of Onset  . Hypertension Mother   . Hypertension Father   . Mental illness Neg Hx    Family Psychiatric  History: See H&P.  Social History:  History  Alcohol Use No     History  Drug Use  . Types: Other-see comments, Marijuana    Comment: oil smoked in pipe calls "DAB", friends buy for him    Social History   Social History  . Marital status: Single    Spouse name: N/A  . Number of children: N/A  . Years of education: N/A   Social History Main Topics  .  Smoking status: Current Every Day Smoker    Packs/day: 2.00  . Smokeless tobacco: Never Used  . Alcohol use No  . Drug use: Yes    Types: Other-see comments, Marijuana     Comment: oil smoked in pipe calls "DAB", friends buy for him  . Sexual activity: Yes    Birth control/ protection: Condom   Other Topics Concern  . None   Social History Narrative  . None   Additional Social History:   Sleep: Fair  Appetite:  Fair  Current Medications: Current Facility-Administered Medications  Medication Dose Route Frequency Provider Last Rate Last Dose  . acetaminophen (TYLENOL) tablet 650 mg  650 mg Oral Q6H PRN Kerry Hough, PA-C      . alum & mag hydroxide-simeth (MAALOX/MYLANTA) 200-200-20 MG/5ML suspension 30 mL  30 mL Oral Q4H PRN Kerry Hough, PA-C      . escitalopram (LEXAPRO) tablet 20 mg  20 mg Oral Daily Donell Sievert E, PA-C   20 mg at 05/10/17 0801  . famotidine (PEPCID) tablet 20 mg  20 mg Oral BID Kerry Hough, PA-C   20 mg at 05/10/17 0801  . hydrOXYzine (ATARAX/VISTARIL) tablet 25 mg  25 mg Oral Q6H PRN Donell Sievert E, PA-C      . magnesium hydroxide (MILK OF MAGNESIA) suspension 30 mL  30 mL Oral Daily PRN Donell Sievert E, PA-C      . nicotine (NICODERM CQ - dosed in mg/24 hours) patch 21 mg  21 mg Transdermal  Daily Jaselyn Nahm, Rockey Situ, MD   21 mg at 05/10/17 0800  . QUEtiapine (SEROQUEL) tablet 25 mg  25 mg Oral BID Herndon Grill, Rockey Situ, MD   25 mg at 05/10/17 0801  . QUEtiapine (SEROQUEL) tablet 50 mg  50 mg Oral QHS Evianna Chandran, Rockey Situ, MD      . traZODone (DESYREL) tablet 50 mg  50 mg Oral QHS PRN Kem Parcher, Rockey Situ, MD   50 mg at 05/09/17 2102    Lab Results:  Results for orders placed or performed during the hospital encounter of 05/09/17 (from the past 48 hour(s))  Lipid panel     Status: Abnormal   Collection Time: 05/10/17  6:22 AM  Result Value Ref Range   Cholesterol 125 0 - 200 mg/dL   Triglycerides 54 <409 mg/dL   HDL 35 (L) >81 mg/dL   Total  CHOL/HDL Ratio 3.6 RATIO   VLDL 11 0 - 40 mg/dL   LDL Cholesterol 79 0 - 99 mg/dL    Comment:        Total Cholesterol/HDL:CHD Risk Coronary Heart Disease Risk Table                     Men   Women  1/2 Average Risk   3.4   3.3  Average Risk       5.0   4.4  2 X Average Risk   9.6   7.1  3 X Average Risk  23.4   11.0        Use the calculated Patient Ratio above and the CHD Risk Table to determine the patient's CHD Risk.        ATP III CLASSIFICATION (LDL):  <100     mg/dL   Optimal  191-478  mg/dL   Near or Above                    Optimal  130-159  mg/dL   Borderline  295-621  mg/dL   High  >308     mg/dL   Very High Performed at [facility], [address] [place], [place] [address]   Hemoglobin A1c     Status: None   Collection Time: 05/10/17  6:22 AM  Result Value Ref Range   Hgb A1c MFr Bld 5.2 4.8 - 5.6 %    Comment: (NOTE) Pre diabetes:          5.7%-6.4% Diabetes:              >6.4% Glycemic control for   <7.0% adults with diabetes    Mean Plasma Glucose 102.54 mg/dL    Comment: Performed at [facility], [address], [place], [place] [address]  TSH     Status: None   Collection Time: 05/10/17  6:22 AM  Result Value Ref Range   TSH 0.743 0.350 - 4.500 uIU/mL    Comment: Performed by a 3rd Generation assay with a functional sensitivity of <=0.01 uIU/mL. Performed at [facility], [address] [place], [place] [address]    Blood Alcohol level:  Lab Results  Component Value Date   [facility] <10 05/08/2017   ETH <5 09/09/2015   Metabolic Disorder Labs: Lab Results  Component Value Date   HGBA1C 5.2 05/10/2017   MPG 102.54 05/10/2017   MPG 117 09/11/2015   Lab Results  Component Value Date   PROLACTIN 2.1 (L) 09/11/2015   Lab Results  Component Value Date  CHOL 125 05/10/2017   TRIG 54 05/10/2017   HDL 35 (L) 05/10/2017   CHOLHDL 3.6 05/10/2017   VLDL 11 05/10/2017   LDLCALC 79 05/10/2017   LDLCALC 79  09/11/2015   Physical Findings: AIMS: Facial and Oral Movements Muscles of Facial Expression: None, normal Lips and Perioral Area: None, normal Jaw: None, normal Tongue: None, normal,Extremity Movements Upper (arms, wrists, hands, fingers): None, normal Lower (legs, knees, ankles, toes): None, normal, Trunk Movements Neck, shoulders, hips: None, normal, Overall Severity Severity of abnormal movements (highest score from questions above): None, normal Incapacitation due to abnormal movements: None, normal Patient's awareness of abnormal movements (rate only patient's report): No Awareness, Dental Status Current problems with teeth and/or dentures?: No Does patient usually wear dentures?: No  CIWA:    COWS:     Musculoskeletal: Strength & Muscle Tone: within normal limits Gait & Station: normal Patient leans: Right and N/A  Psychiatric Specialty Exam: Physical Exam  Nursing note and vitals reviewed.   Review of Systems  Psychiatric/Behavioral: Positive for depression. Negative for substance abuse and suicidal ideas.    Blood pressure 135/76, pulse (!) 107, temperature 98.2 F (36.8 C), resp. rate 16, height  (1.854 m), weight 105.2 kg (232 lb).Body mass index is 30.61 kg/m.  Appearance: Fairly Groomed  Eye Contact:  Fair  Speech:  minimal at this time   Volume:  Decreased  Mood:  minimizes depression at this time  Affect:  blunted, flat   Thought Process:  Presents with slowed thought process   Orientation:  Other:  presents alert and attentive,but states " I don't know" repeatedly to orientation questions   Thought Content:  denies hallucinations at this time, and currently does not present internally preoccupied, no delusions expressed at this time , presents guarded  Suicidal Thoughts:  No denies suicidal ideations, denies homicidal ideations   Homicidal Thoughts:  No  Memory:  recent and remote fair   Judgement:  Impaired  Insight:  Shallow  Psychomotor  Activity:  Decreased  Concentration:  Concentration: Fair and Attention Span: Fair  Recall:  [employer] of Knowledge:  Fair  Language:  Fair  Akathisia:  Negative  Handed:  Right  AIMS (if indicated):     Assets:  Desire for Improvement Resilience  ADL's:  fair  Cognition:  WNL  Sleep:  Number of Hours: 5.75     Treatment Plan Summary: Daily contact with patient to assess and evaluate symptoms and progress in treatment.  Will continue today 05/10/17 plan as below except where it is noted. -Continue Lexapro 20 mg daily for depression. -Continue Hydroxyzine 25 mg prn for anxiety Q 6 hours. -Continue Seroquel 25 mg bid for agitation. -Continue Seroquel 50 mg Q hs for mood control. -Continue Trazodone 50 mg prn Q hs for insomnia.  - Continue 15 minutes observation for safety concerns - Encouraged to participate in milieu therapy and group therapy counseling sessions and also work with coping skills -  Develop treatment plan to decrease risk & need for readmission. -  Psycho-social education regarding self care. - Health care follow up as needed for medical problems. - Restart home medications where appropriate.  Sanjuana Kava, NP, PMHNP, FNP-BC. 05/10/2017, 4:18 PM  Agree with NP Progress Note

## 2017-05-10 NOTE — Progress Notes (Signed)
Continuous observation note:   Pt endorses thoughts of self-harm.  Will not disclose plan at this time.  Reports "I don't feel safe."  Refuses to contract for safety.  Pt placed on continuous observation.

## 2017-05-10 NOTE — Progress Notes (Signed)
[facility] Post 1:1 Observation Documentation  For the first (8) hours following discontinuation of 1:1 precautions, a progress note entry by nursing staff should be documented at least every 2 hours, reflecting the patient's behavior, condition, mood, and conversation.  Use the progress notes for additional entries.  Time 1:1 discontinued:  0900  Patient's Behavior:  Pt. Is pleasant, calm and cooperative.  Patient's Condition: stable   Patient's Conversation:  Pt. Forwards little but responds appropriately to questions asked.  Carlisle Cater 05/10/2017, 7:25 PM

## 2017-05-11 DIAGNOSIS — R443 Hallucinations, unspecified: Secondary | ICD-10-CM

## 2017-05-11 DIAGNOSIS — R450 Nervousness: Secondary | ICD-10-CM

## 2017-05-11 DIAGNOSIS — R45851 Suicidal ideations: Secondary | ICD-10-CM

## 2017-05-11 DIAGNOSIS — G4700 Insomnia, unspecified: Secondary | ICD-10-CM

## 2017-05-11 LAB — PROLACTIN: Prolactin: 6.5 ng/mL (ref 4.0–15.2)

## 2017-05-11 NOTE — Progress Notes (Signed)
Recreation Therapy Notes  Date: 05/11/17 Time: 1000 Location: 500 Hall Dayroom  Group Topic: Leisure Education  Goal Area(s) Addresses:  Patient will identify positive leisure activities.  Patient will identify one positive benefit of participation in leisure activities.   Intervention: [employer], dry erase marker, eraser, container with words  Activity: Leisure Pictionary.  Patients were divided into two groups.  One person from the group would pick a slip of paper from the container.  Patient would draw the picture of whatever word they picked on the board.  That team would have one minute to draw and guess what the picture is.  After the minute is up, they would get one final guess.  If the team doesn't guess the picture, the other team gets a chance to steal the point.  Education:  Leisure Education, Building control surveyor  Education Outcome: Acknowledges education/In group clarification offered/Needs additional education  Clinical Observations/Feedback: Pt did not attend group.   Caroll Rancher, LRT/CTRS         Caroll Rancher A 05/11/2017 12:17 PM

## 2017-05-11 NOTE — Progress Notes (Signed)
Adult Psychoeducational Group Note  Date:  05/11/2017 Time:  11:18 PM  Group Topic/Focus:  Wrap-Up Group:   The focus of this group is to help patients review their daily goal of treatment and discuss progress on daily workbooks.  Participation Level:  Did Not Attend  Participation Quality:  Did not attend  Affect:  Did not attend  Cognitive:  Did not attend  Insight: None  Engagement in Group:  Did not attend  Modes of Intervention:  Did not attend  Additional Comments:  Pt did not attend evening wrap up group tonight.  Felipa Furnace 05/11/2017, 11:18 PM

## 2017-05-11 NOTE — [facility] Group Notes (Signed)
[facility] LCSW Group Therapy  05/11/2017 3:11 PM  Type of Therapy:  Group Therapy  Participation Level: Limited  Participation Quality:  Minimal  Affect:  Flat  Cognitive:  Limited  Insight:  Limited  Engagement in Therapy:  Unengaged  Modes of Intervention:  Discussion, Exploration, Socialization and Support  Summary of Progress/Problems:  Chaplain led group explored concept of hope and its relevance to mental health recovery.  Patients explored themes including what matters to them personally, how others responses are similar/different, and what they are hopeful for.  Group members discussed relevance of social supports, innter strength and using their own stories to craft a recovery path.  Patient came in/out of room and participated minimally in the group.  Sallee Lange

## 2017-05-11 NOTE — Progress Notes (Signed)
[facility] MD Progress Note  05/11/2017 2:47 PM Marvin Crawford  MRN:  [id number]   Subjective: Patient seen resting in bed. Placed on close observation for safety. Patient continues to present with a flat and guarded affect. Per staffing notes patient is not able to contact for safety at this time. Patient denies suicidal ideation or thoughts of  self harm during this discussion. Patient responses with sharpe "yes or no" answers. Denies hallucination and dosnt appear to responding internally. Support, encouragement and reassurance was provided.    Principal Problem: Schizo-affective schizophrenia, chronic condition with acute exacerbation (HCC) Diagnosis:   Patient Active Problem List   Diagnosis Date Noted  . Schizo-affective schizophrenia, chronic condition with acute exacerbation (HCC) [F25.8] 05/09/2017  . 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 Stuttering [F80.81] 08/09/2015  . Cannabis use disorder, moderate, dependence (HCC) [F12.20] 08/09/2015  . MDD (major depressive disorder), recurrent, severe, with psychosis (HCC) [F33.3] 02/16/2013   Total Time spent with patient: 20 minutes  Past Psychiatric History:   Past Medical History:  Past Medical History:  Diagnosis Date  . ADD (attention deficit disorder)   . Anxiety   . Depression   . Environmental allergies    History reviewed. No pertinent surgical history. Family History:  Family History  Problem Relation Age of Onset  . Hypertension Mother   . Hypertension Father   . Mental illness Neg Hx    Family Psychiatric  History:  Social History:  History  Alcohol Use No     History  Drug Use  . Types: Other-see comments, Marijuana    Comment: oil smoked in pipe calls "DAB", friends buy for him    Social History   Social History  . Marital status: Single    Spouse name: N/A  . Number of children: N/A  . Years of education: N/A   Social History Main Topics  . Smoking status: Current Every Day Smoker     Packs/day: 2.00  . Smokeless tobacco: Never Used  . Alcohol use No  . Drug use: Yes    Types: Other-see comments, Marijuana     Comment: oil smoked in pipe calls "DAB", friends buy for him  . Sexual activity: Yes    Birth control/ protection: Condom   Other Topics Concern  . None   Social History Narrative  . None   Additional Social History:                         Sleep: Fair  Appetite:  Fair  Current Medications: Current Facility-Administered Medications  Medication Dose Route Frequency Provider Last Rate Last Dose  . acetaminophen (TYLENOL) tablet 650 mg  650 mg Oral Q6H PRN Kerry Hough, PA-C      . alum & mag hydroxide-simeth (MAALOX/MYLANTA) 200-200-20 MG/5ML suspension 30 mL  30 mL Oral Q4H PRN Kerry Hough, PA-C      . escitalopram (LEXAPRO) tablet 20 mg  20 mg Oral Daily Donell Sievert E, PA-C   20 mg at 05/11/17 7829  . famotidine (PEPCID) tablet 20 mg  20 mg Oral BID Kerry Hough, PA-C   20 mg at 05/11/17 5621  . hydrOXYzine (ATARAX/VISTARIL) tablet 25 mg  25 mg Oral Q6H PRN Kerry Hough, PA-C   25 mg at 05/10/17 2119  . magnesium hydroxide (MILK OF MAGNESIA) suspension 30 mL  30 mL Oral Daily PRN Donell Sievert E, PA-C      . nicotine (NICODERM CQ -  dosed in mg/24 hours) patch 21 mg  21 mg Transdermal Daily Cobos, Rockey Situ, MD   21 mg at 05/10/17 0800  . QUEtiapine (SEROQUEL) tablet 25 mg  25 mg Oral BID Cobos, Rockey Situ, MD   25 mg at 05/11/17 1610  . QUEtiapine (SEROQUEL) tablet 50 mg  50 mg Oral QHS Cobos, Rockey Situ, MD   50 mg at 05/10/17 2120  . traZODone (DESYREL) tablet 50 mg  50 mg Oral QHS PRN Cobos, Rockey Situ, MD   50 mg at 05/10/17 2119    Lab Results:  Results for orders placed or performed during the hospital encounter of 05/09/17 (from the past 48 hour(s))  Lipid panel     Status: Abnormal   Collection Time: 05/10/17  6:22 AM  Result Value Ref Range   Cholesterol 125 0 - 200 mg/dL   Triglycerides 54 <960 mg/dL    HDL 35 (L) >45 mg/dL   Total CHOL/HDL Ratio 3.6 RATIO   VLDL 11 0 - 40 mg/dL   LDL Cholesterol 79 0 - 99 mg/dL    Comment:        Total Cholesterol/HDL:CHD Risk Coronary Heart Disease Risk Table                     Men   Women  1/2 Average Risk   3.4   3.3  Average Risk       5.0   4.4  2 X Average Risk   9.6   7.1  3 X Average Risk  23.4   11.0        Use the calculated Patient Ratio above and the CHD Risk Table to determine the patient's CHD Risk.        ATP III CLASSIFICATION (LDL):  <100     mg/dL   Optimal  409-811  mg/dL   Near or Above                    Optimal  130-159  mg/dL   Borderline  914-782  mg/dL   High  >956     mg/dL   Very High Performed at [facility], [address] [place], [place] [address]   Hemoglobin A1c     Status: None   Collection Time: 05/10/17  6:22 AM  Result Value Ref Range   Hgb A1c MFr Bld 5.2 4.8 - 5.6 %    Comment: (NOTE) Pre diabetes:          5.7%-6.4% Diabetes:              >6.4% Glycemic control for   <7.0% adults with diabetes    Mean Plasma Glucose 102.54 mg/dL    Comment: Performed at [facility], [address] [place], [place] [address]  Prolactin     Status: None   Collection Time: 05/10/17  6:22 AM  Result Value Ref Range   Prolactin 6.5 4.0 - 15.2 ng/mL    Comment: (NOTE) Performed At: [facility] [address] [place], [place] [id number] Mila Homer MD [id number] Performed at [facility], [address] [place], [place] [address]   TSH     Status: None   Collection Time: 05/10/17  6:22 AM  Result Value Ref Range   TSH 0.743 0.350 - 4.500 uIU/mL    Comment: Performed by a 3rd Generation assay with a functional sensitivity of <=0.01 uIU/mL. Performed at [facility], [address] [place], [place] [address]     Blood Alcohol level:  Lab Results  Component Value Date   ETH <10 05/08/2017   ETH <5 09/09/2015    Metabolic Disorder  Labs: Lab Results  Component Value Date   HGBA1C 5.2 05/10/2017   MPG 102.54 05/10/2017   MPG 117 09/11/2015   Lab Results  Component Value Date   PROLACTIN 6.5 05/10/2017   PROLACTIN 2.1 (L) 09/11/2015   Lab Results  Component Value Date   CHOL 125 05/10/2017   TRIG 54 05/10/2017   HDL 35 (L) 05/10/2017   CHOLHDL 3.6 05/10/2017   VLDL 11 05/10/2017   LDLCALC 79 05/10/2017   LDLCALC 79 09/11/2015    Physical Findings: AIMS: Facial and Oral Movements Muscles of Facial Expression: None, normal Lips and Perioral Area: None, normal Jaw: None, normal Tongue: None, normal,Extremity Movements Upper (arms, wrists, hands, fingers): None, normal Lower (legs, knees, ankles, toes): None, normal, Trunk Movements Neck, shoulders, hips: None, normal, Overall Severity Severity of abnormal movements (highest score from questions above): None, normal Incapacitation due to abnormal movements: None, normal Patient's awareness of abnormal movements (rate only patient's report): No Awareness, Dental Status Current problems with teeth and/or dentures?: No Does patient usually wear dentures?: No  CIWA:    COWS:     Musculoskeletal: Strength & Muscle Tone: within normal limits Gait & Station: normal Patient leans: N/A  Psychiatric Specialty Exam: Physical Exam  Vitals reviewed. Psychiatric: He has a normal mood and affect. His behavior is normal.    Review of Systems  Psychiatric/Behavioral: Positive for depression and hallucinations. The patient is nervous/anxious.     Blood pressure (!) 112/57, pulse (!) 108, temperature 98.4 F (36.9 C), temperature source Oral, resp. rate 18, height  (1.854 m), weight 105.2 kg (232 lb).Body mass index is 30.61 kg/m.  General Appearance: Guarded blunted, but pleasant   Eye Contact:  Fair  Speech:  Slow  Volume:  Decreased  Mood:  Depressed and Dysphoric  Affect:  Blunt, Depressed and Flat  Thought Process:  Linear  Orientation:  Other:   Person and place  Thought Content:  Paranoid Ideation and Rumination  Suicidal Thoughts:  Yes.  with intent/plan was denied during this assessment, Per staff patient reports passive thoughts and hx of attempts while inpatient. To continue with close observation.  Homicidal Thoughts:  No  Memory:  Immediate;   Fair Recent;   Fair  Judgement:  Impaired  Insight:  Lacking  Psychomotor Activity:  Decreased  Concentration:  Concentration: Fair  Recall:  Poor  Fund of Knowledge:  Poor  Language:  Fair  Akathisia:  No  Handed:  Right  AIMS (if indicated):     Assets:  Desire for Improvement Resilience Social Support  ADL's:  Intact  Cognition:  Impaired,  Moderate  Sleep:  Number of Hours: 6     I agree with current treatment plan on 05/11/2017, Patient seen face-to-face for psychiatric evaluation follow-up, chart reviewed. Reviewed the information documented and agree with the treatment plan.  Treatment Plan Summary: Daily contact with patient to assess and evaluate symptoms and progress in treatment and Medication management   Will continue today 05/11/17 plan as below except where it is noted.  Continue Lexapro 20 mg daily for depression. Continue Hydroxyzine 25 mg prn for anxiety Q 6 hours. Continue Seroquel 25 mg bid for agitation. Continue Seroquel 50 mg Q hs for mood control. Continue Trazodone 50 mg prn Q hs for insomnia.  Continue with close observation for safety concerns Encouraged to participate in milieu therapy and group therapy counseling sessions and also work with coping skills  Develop treatment plan to decrease risk & need for readmission. Psycho-social education regarding self care.   Oneta Rack, NP 05/11/2017, 2:47 PM   Agree with NP Progress Note

## 2017-05-11 NOTE — Progress Notes (Addendum)
D: Pt awake sitting in bed on initial approach. Presents with flat affect and depressed mood when assessed. Denies pain, AVH and HI. Pt reports active SI without a plan, refused to contract for safety. Compliant with medication when offered. Denies adverse drug reactions at present. Reports he slept well with good appetite. A: Medications administered as per MD's orders with verbal education and effects monitored. Close observation maintained as ordered for safety with assigned staff in attendance at all times. Support and  Encouragement offered to pt.  R: Pt receptive to care at this time. Pt has not exhibited self harm behavior thus far. POC continues for safety and mood stability.

## 2017-05-11 NOTE — Progress Notes (Signed)
D: Pt is resting in bed with eyes closed.  Respirations are even and unlabored.  No distress noted.  A: Continuous observation continues for pt safety.  Staff member is in line of sight of pt at all times.   R: Pt is safe. 

## 2017-05-11 NOTE — Progress Notes (Addendum)
D: Pt observed interacting well with assigned staff. Attended spiritual group briefly this afternoon but left to use the bathroom. Tolerating meals and fluids well.  A: Close observation maintained for safety as ordered with pt in staff's line of sight at all times. No self harm gestures to note at this time.  R: Safety maintained on unit. Pt denies concerns at this time.

## 2017-05-11 NOTE — Progress Notes (Signed)
Patient is currently in the shower. He has been up briefly sitting in the dayroom eating snack. He was compliant with his medication and c/o soreness on his chest, left side and received tylenol and heat packs. He denies si/hi/a/v hallucinations. His close observation continues and patient is safe.

## 2017-05-11 NOTE — Progress Notes (Addendum)
D: Pt awake in dayroom eating dinner and watching TV at present. Denies concerns.  A: Evening medications given as per MD's orders. Support and encouragement offered to pt throughout this shift thus far. Close observation maintained for safety without incident to note at this time. .  R: Pt tolerated all PO intake well. Remains safe on unit.

## 2017-05-11 NOTE — Progress Notes (Signed)
D: Pt is resting in bed with eyes closed.  Respirations are even and unlabored.  No distress noted.  A: Continuous observation continues for pt safety.  Staff member is in line of sight of pt at all times.   R: Pt is safe.

## 2017-05-11 NOTE — Progress Notes (Signed)
Adult Psychoeducational Group Note  Date:  05/11/2017 Time:  11:12 PM  Group Topic/Focus:  Wrap-Up Group:   The focus of this group is to help patients review their daily goal of treatment and discuss progress on daily workbooks.  Participation Level:  Did Not Attend  Participation Quality:  Did Not Attend  Affect:  Did not attend  Cognitive:  Did not attend  Insight: None  Engagement in Group:  Did not attend  Modes of Intervention:  Did not attend  Additional Comments:  Pt did not attend evening wrap up group tonight.  Felipa Furnace 05/11/2017, 11:12 PM

## 2017-05-12 DIAGNOSIS — F1210 Cannabis abuse, uncomplicated: Secondary | ICD-10-CM

## 2017-05-12 DIAGNOSIS — F29 Unspecified psychosis not due to a substance or known physiological condition: Secondary | ICD-10-CM

## 2017-05-12 DIAGNOSIS — F333 Major depressive disorder, recurrent, severe with psychotic symptoms: Principal | ICD-10-CM

## 2017-05-12 MED ORDER — QUETIAPINE FUMARATE 50 MG PO TABS
75.0000 mg | ORAL_TABLET | Freq: Every day | ORAL | Status: DC
  Administered 2017-05-12 – 2017-05-14 (×3): 75 mg via ORAL
  Filled 2017-05-12 (×5): qty 1

## 2017-05-12 NOTE — Progress Notes (Signed)
[facility] MD Progress Note  05/12/2017 12:25 PM Marvin Crawford  MRN:  [id number]   Subjective:Patient states " I am depressed."   Objective:Patient seen and chart reviewed.Discussed patient with treatment team.  Pt today seen in his room , is on closed observation precaution for self injurious thoughts and gestures and similar behaviors on the unit in the past. Pt with borderline IQ- today continues to endorse sadness , anxiety as well as some SI and AH. Pt per staff continues to need a lot of support. Is compliant on medications.         Principal Problem: MDD (major depressive disorder), recurrent, severe, with psychosis (HCC) Diagnosis:   Patient Active Problem List   Diagnosis Date Noted  . 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 Stuttering [F80.81] 08/09/2015  . Cannabis use disorder, moderate, dependence (HCC) [F12.20] 08/09/2015  . MDD (major depressive disorder), recurrent, severe, with psychosis (HCC) [F33.3] 02/16/2013   Total Time spent with patient: 25 minutes  Past Psychiatric History: Please see H&P.   Past Medical History:  Past Medical History:  Diagnosis Date  . ADD (attention deficit disorder)   . Anxiety   . Depression   . Environmental allergies    History reviewed. No pertinent surgical history. Family History:  Family History  Problem Relation Age of Onset  . Hypertension Mother   . Hypertension Father   . Mental illness Neg Hx    Family Psychiatric  History: Please see H&P.  Social History:  History  Alcohol Use No     History  Drug Use  . Types: Other-see comments, Marijuana    Comment: oil smoked in pipe calls "DAB", friends buy for him    Social History   Social History  . Marital status: Single    Spouse name: N/A  . Number of children: N/A  . Years of education: N/A   Social History Main Topics  . Smoking status: Current Every Day Smoker    Packs/day: 2.00  . Smokeless tobacco: Never Used  . Alcohol use No   . Drug use: Yes    Types: Other-see comments, Marijuana     Comment: oil smoked in pipe calls "DAB", friends buy for him  . Sexual activity: Yes    Birth control/ protection: Condom   Other Topics Concern  . None   Social History Narrative  . None   Additional Social History:                         Sleep: Fair  Appetite:  Fair  Current Medications: Current Facility-Administered Medications  Medication Dose Route Frequency Provider Last Rate Last Dose  . acetaminophen (TYLENOL) tablet 650 mg  650 mg Oral Q6H PRN Kerry Hough, PA-C   650 mg at 05/11/17 2124  . alum & mag hydroxide-simeth (MAALOX/MYLANTA) 200-200-20 MG/5ML suspension 30 mL  30 mL Oral Q4H PRN Kerry Hough, PA-C      . escitalopram (LEXAPRO) tablet 20 mg  20 mg Oral Daily Donell Sievert E, PA-C   20 mg at 05/12/17 0810  . famotidine (PEPCID) tablet 20 mg  20 mg Oral BID Kerry Hough, PA-C   20 mg at 05/12/17 0810  . hydrOXYzine (ATARAX/VISTARIL) tablet 25 mg  25 mg Oral Q6H PRN Kerry Hough, PA-C   25 mg at 05/10/17 2119  . magnesium hydroxide (MILK OF MAGNESIA) suspension 30 mL  30 mL Oral Daily PRN Kerry Hough, PA-C      .  QUEtiapine (SEROQUEL) tablet 25 mg  25 mg Oral BID Cobos, Rockey Situ, MD   25 mg at 05/12/17 0811  . QUEtiapine (SEROQUEL) tablet 50 mg  50 mg Oral QHS Cobos, Rockey Situ, MD   50 mg at 05/11/17 2118  . traZODone (DESYREL) tablet 50 mg  50 mg Oral QHS PRN Cobos, Rockey Situ, MD   50 mg at 05/11/17 2125    Lab Results:  No results found for this or any previous visit (from the past 48 hour(s)).  Blood Alcohol level:  Lab Results  Component Value Date   ETH <10 05/08/2017   ETH <5 09/09/2015    Metabolic Disorder Labs: Lab Results  Component Value Date   HGBA1C 5.2 05/10/2017   MPG 102.54 05/10/2017   MPG 117 09/11/2015   Lab Results  Component Value Date   PROLACTIN 6.5 05/10/2017   PROLACTIN 2.1 (L) 09/11/2015   Lab Results  Component Value Date    CHOL 125 05/10/2017   TRIG 54 05/10/2017   HDL 35 (L) 05/10/2017   CHOLHDL 3.6 05/10/2017   VLDL 11 05/10/2017   LDLCALC 79 05/10/2017   LDLCALC 79 09/11/2015    Physical Findings: AIMS: Facial and Oral Movements Muscles of Facial Expression: None, normal Lips and Perioral Area: None, normal Jaw: None, normal Tongue: None, normal,Extremity Movements Upper (arms, wrists, hands, fingers): None, normal Lower (legs, knees, ankles, toes): None, normal, Trunk Movements Neck, shoulders, hips: None, normal, Overall Severity Severity of abnormal movements (highest score from questions above): None, normal Incapacitation due to abnormal movements: None, normal Patient's awareness of abnormal movements (rate only patient's report): No Awareness, Dental Status Current problems with teeth and/or dentures?: No Does patient usually wear dentures?: No  CIWA:    COWS:     Musculoskeletal: Strength & Muscle Tone: within normal limits Gait & Station: normal Patient leans: N/A  Psychiatric Specialty Exam: Physical Exam  Nursing note and vitals reviewed.   Review of Systems  Psychiatric/Behavioral: Positive for depression and hallucinations. The patient is nervous/anxious.   All other systems reviewed and are negative.   Blood pressure 118/77, pulse 99, temperature 98.5 F (36.9 C), temperature source Oral, resp. rate 16, height  (1.854 m), weight 105.2 kg (232 lb).Body mass index is 30.61 kg/m.  General Appearance: Guarded   Eye Contact:  Fair  Speech:  Slow  Volume:  Decreased  Mood:  Anxious and Dysphoric  Affect:  Congruent  Thought Process:  Linear and Descriptions of Associations: Circumstantial  Orientation:  Other:  self, situation  Thought Content:  Hallucinations: Auditory, Paranoid Ideation and Rumination, improving  Suicidal Thoughts:  Yes.  without intent/plan on and off   Homicidal Thoughts:  No  Memory:  Immediate;   Fair Recent;   Fair Remote;   Fair   Judgement:  Impaired  Insight:  Lacking  Psychomotor Activity:  Decreased  Concentration:  Concentration: Fair and Attention Span: Fair  Recall:  [employer] of Knowledge:  Fair  Language:  Fair  Akathisia:  No  Handed:  Right  AIMS (if indicated):     Assets:  Desire for Improvement Resilience Social Support  ADL's:  Intact  Cognition:  WNL  Sleep:  Number of Hours: 6.75      Treatment Plan Summary: Patient presented with SI as well as sadness , currently on closed observation as well as medications - making some progress, will continue the same. Daily contact with patient to assess and evaluate symptoms and progress in  treatment, Medication management and Plan see below   Will continue today 05/12/17  plan as below except where it is noted.   Continue Lexapro 20  mg po daily for affective sx. Continue Hydroxyzine 25 mg prn for anxiety Q 6 hours. Continue Seroquel 25 mg bid for agitation. Increase Seroquel to 75 mg po qhs for psychosis/mood sx. Continue Trazodone 50 mg prn Q hs for insomnia. Continue with close observation for safety concerns CSW will continue to work on disposition.  Jomarie Longs, MD 05/12/2017, 12:25 PM

## 2017-05-12 NOTE — Progress Notes (Signed)
D: Pt awake in dayroom eating dinner at this time, watching TV. Verbally interactive with staff. Denies concerns at this time.  A: Close observation maintained. Assigned staff in attendance at all times without self harm gestures or outburst.  R: Pt denies concerns at this time. Remains safe on unit. POC remains effective for safety and mood stability.

## 2017-05-12 NOTE — [facility] Group Notes (Signed)
  [facility] LCSW Group Therapy Note  Date/Time:  05/12/2017 11:15AM-12:00PM  Type of Therapy and Topic:  Group Therapy:  Feelings About Hospitalization  Participation Level:  Minimal   Description of Group This process group involved patients discussing their feelings related to being hospitalized, as well as the benefits they see to being in the hospital.  These feelings and benefits were itemized.  The group then brainstormed specific ways in which they could seek those same benefits when they discharge and return home.  Therapeutic Goals 1. Patient will identify and describe positive and negative feelings related to hospitalization 2. Patient will verbalize benefits of hospitalization to themselves personally 3. Patients will brainstorm together ways they can obtain similar benefits in the outpatient setting, identify barriers to wellness and possible solutions  Summary of Patient Progress:  The patient expressed his primary feelings about being hospitalized are that it is making his depression better; however, he did not interact much during group and after only about 15-20 minutes left the group and did not return.  Therapeutic Modalities Cognitive Behavioral Therapy Motivational Interviewing    Ambrose Mantle, LCSW 05/12/2017, 9:52 AM

## 2017-05-12 NOTE — Progress Notes (Signed)
Patient is lying in bed asleep with eyes closed and respirations even and unlabored. Close observation continues and patient is safe.

## 2017-05-12 NOTE — Progress Notes (Signed)
D: Pt awake in hall at brief intervals this afternoon. Attended CSW work but left early. Minimal but appropriate interactions observed with staff.  A: Continued support and encouragement offered to pt. Pt in staff line of sight at all time. Close observation remains effective without incident.   R: Pt cooperative with care thus far. POC remains effective for safety and mood stability.

## 2017-05-12 NOTE — Plan of Care (Signed)
Problem: Safety: Goal: Ability to disclose and discuss suicidal ideas will improve Outcome: Progressing Pt unable to contract for safety at present. Remains on Close observation, assigned staff in attendance at all times. No self harm gestures exhibited.

## 2017-05-12 NOTE — Progress Notes (Signed)
Patient is currently lying in bed asleep, no distress noted. Patient is safe and close observation continues.

## 2017-05-12 NOTE — Progress Notes (Signed)
Patient is currently taking a shower after he has been up in the dayroom watching tv and attended group tonight. He reported his goal is to discharge. Writer spoke with him 1:1 and he reports having had a good day. Writer inquired what types of things he and his mom do and he reported that they have built a garden. He denies si/hi/a/v hallucinations.Writer praised him for being out of his room more instead of staying to himself. Close observation continues and patient remains safe.

## 2017-05-12 NOTE — Progress Notes (Signed)
D: Pt visible in hall on initial encounter. Presents animated but anxious. Denies HI, VH and pain. Reports SI and AH. Per pt "I feel depressed because I don't have friends, I don't go out as much, I live with my mom".  States sometimes "I think I hear people call my name". Rates his depression 5/10, hopelessness 5/10 and anxiety 5/10. Pt has been tolerating all PO intake well. Goal for today is to go to group and stay focus. A: Medications administered as prescribed with verbal education and effects monitored. Emotional support and availability offered to pt. Encouraged pt to voice concerns and comply with treatment regimen as ordered including group. Close observation maintained for safety with assigned staff in attendance at all times. R: Pt remains medication compliant. Denies adverse drug reactions at this time. Safety maintained on unit without outburst or self harm gestures at this time.

## 2017-05-13 NOTE — Progress Notes (Signed)
[facility] MD Progress Note  05/13/2017 11:26 AM Marvin Crawford  MRN:  [id number]   Subjective: Marvin Crawford reports " I am okay" Per staff patient reports that patient had expressed that he was suicidal this morning, however patient denies thoughts or intent during this assessment. Patient reports he was able to speak to his mother who advised him to "pray" patient reports " I don't want to pray."   Objective:  Patient to continue with 1:1 for safety. Staff to encourage group sessions. Reports taken and tolerating medications. States he slept "okay".  Pt with borderline IQ- Support, encouragement  and reassurance was provided.   Principal Problem: MDD (major depressive disorder), recurrent, severe, with psychosis (HCC) Diagnosis:   Patient Active Problem List   Diagnosis Date Noted  . 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 Stuttering [F80.81] 08/09/2015  . Cannabis use disorder, moderate, dependence (HCC) [F12.20] 08/09/2015  . MDD (major depressive disorder), recurrent, severe, with psychosis (HCC) [F33.3] 02/16/2013   Total Time spent with patient: 25 minutes  Past Psychiatric History: Please see H&P.   Past Medical History:  Past Medical History:  Diagnosis Date  . ADD (attention deficit disorder)   . Anxiety   . Depression   . Environmental allergies    History reviewed. No pertinent surgical history. Family History:  Family History  Problem Relation Age of Onset  . Hypertension Mother   . Hypertension Father   . Mental illness Neg Hx    Family Psychiatric  History: Please see H&P.  Social History:  History  Alcohol Use No     History  Drug Use  . Types: Other-see comments, Marijuana    Comment: oil smoked in pipe calls "DAB", friends buy for him    Social History   Social History  . Marital status: Single    Spouse name: N/A  . Number of children: N/A  . Years of education: N/A   Social History Main Topics  . Smoking status: Current Every Day  Smoker    Packs/day: 2.00  . Smokeless tobacco: Never Used  . Alcohol use No  . Drug use: Yes    Types: Other-see comments, Marijuana     Comment: oil smoked in pipe calls "DAB", friends buy for him  . Sexual activity: Yes    Birth control/ protection: Condom   Other Topics Concern  . None   Social History Narrative  . None   Additional Social History:                         Sleep: Fair  Appetite:  Fair  Current Medications: Current Facility-Administered Medications  Medication Dose Route Frequency Provider Last Rate Last Dose  . acetaminophen (TYLENOL) tablet 650 mg  650 mg Oral Q6H PRN Kerry Hough, PA-C   650 mg at 05/11/17 2124  . alum & mag hydroxide-simeth (MAALOX/MYLANTA) 200-200-20 MG/5ML suspension 30 mL  30 mL Oral Q4H PRN Kerry Hough, PA-C      . escitalopram (LEXAPRO) tablet 20 mg  20 mg Oral Daily Donell Sievert E, PA-C   20 mg at 05/13/17 0813  . famotidine (PEPCID) tablet 20 mg  20 mg Oral BID Kerry Hough, PA-C   20 mg at 05/13/17 0813  . hydrOXYzine (ATARAX/VISTARIL) tablet 25 mg  25 mg Oral Q6H PRN Kerry Hough, PA-C   25 mg at 05/10/17 2119  . magnesium hydroxide (MILK OF MAGNESIA) suspension 30 mL  30 mL Oral  Daily PRN Kerry Hough, PA-C      . QUEtiapine (SEROQUEL) tablet 25 mg  25 mg Oral BID Penelopi Mikrut, Rockey Situ, MD   25 mg at 05/13/17 0813  . QUEtiapine (SEROQUEL) tablet 75 mg  75 mg Oral QHS Jomarie Longs, MD   75 mg at 05/12/17 2137  . traZODone (DESYREL) tablet 50 mg  50 mg Oral QHS PRN Gracin Mcpartland, Rockey Situ, MD   50 mg at 05/12/17 2137    Lab Results:  No results found for this or any previous visit (from the past 48 hour(s)).  Blood Alcohol level:  Lab Results  Component Value Date   ETH <10 05/08/2017   ETH <5 09/09/2015    Metabolic Disorder Labs: Lab Results  Component Value Date   HGBA1C 5.2 05/10/2017   MPG 102.54 05/10/2017   MPG 117 09/11/2015   Lab Results  Component Value Date   PROLACTIN 6.5  05/10/2017   PROLACTIN 2.1 (L) 09/11/2015   Lab Results  Component Value Date   CHOL 125 05/10/2017   TRIG 54 05/10/2017   HDL 35 (L) 05/10/2017   CHOLHDL 3.6 05/10/2017   VLDL 11 05/10/2017   LDLCALC 79 05/10/2017   LDLCALC 79 09/11/2015    Physical Findings: AIMS: Facial and Oral Movements Muscles of Facial Expression: None, normal Lips and Perioral Area: None, normal Jaw: None, normal Tongue: None, normal,Extremity Movements Upper (arms, wrists, hands, fingers): None, normal Lower (legs, knees, ankles, toes): None, normal, Trunk Movements Neck, shoulders, hips: None, normal, Overall Severity Severity of abnormal movements (highest score from questions above): None, normal Incapacitation due to abnormal movements: None, normal Patient's awareness of abnormal movements (rate only patient's report): No Awareness, Dental Status Current problems with teeth and/or dentures?: No Does patient usually wear dentures?: No  CIWA:    COWS:     Musculoskeletal: Strength & Muscle Tone: within normal limits Gait & Station: normal Patient leans: N/A  Psychiatric Specialty Exam: Physical Exam  Nursing note and vitals reviewed. Constitutional: He is oriented to person, place, and time. He appears well-developed and well-nourished.  Neurological: He is alert and oriented to person, place, and time.  Psychiatric: He has a normal mood and affect. His behavior is normal.    Review of Systems  Psychiatric/Behavioral: Positive for depression and hallucinations. The patient is nervous/anxious.   All other systems reviewed and are negative.   Blood pressure 130/65, pulse (!) 118, temperature 98.3 F (36.8 C), temperature source Oral, resp. rate 16, height  (1.854 m), weight 105.2 kg (232 lb).Body mass index is 30.61 kg/m.  General Appearance: Guarded and flat, smiling and pleasant during this assessment  Eye Contact:  Fair  Speech:  Slow  Volume:  Decreased  Mood:  Anxious and  Dysphoric  Affect:  Congruent  Thought Process:  Linear and Descriptions of Associations: Circumstantial  Orientation:  Other:  self, situation  Thought Content:  Rumination  Suicidal Thoughts:  Yes.  without intent/plan on and off   Homicidal Thoughts:  No  Memory:  Immediate;   Fair Recent;   Fair Remote;   Fair  Judgement:  Impaired  Insight:  Lacking  Psychomotor Activity:  Decreased  Concentration:  Concentration: Fair and Attention Span: Fair  Recall:  [employer] of Knowledge:  Fair  Language:  Fair  Akathisia:  No  Handed:  Right  AIMS (if indicated):     Assets:  Desire for Improvement Resilience Social Support  ADL's:  Intact  Cognition:  WNL  Sleep:  Number of Hours: 6     I agree with current treatment plan on 05/13/2017, Patient seen face-to-face for psychiatric evaluation follow-up, chart reviewed. Reviewed the information documented and agree with the treatment plan.    Treatment Plan Summary:  Daily contact with patient to assess and evaluate symptoms and progress in treatment and Medication management   Will continue today 05/13/17  plan as below except where it is noted.  Continue Lexapro 20  mg po daily for affective sx. Continue Hydroxyzine 25 mg prn for anxiety Q 6 hours. Continue Seroquel 25 mg bid for agitation. Increase Seroquel to 75 mg po qhs for psychosis/mood sx. Continue Trazodone 50 mg prn Q hs for insomnia. Continue with close observation for safety concerns CSW will continue to work on disposition.  Oneta Rack, NP 05/13/2017, 11:26 AM   Agree with NP Progress Note

## 2017-05-13 NOTE — Progress Notes (Signed)
Adult Psychoeducational Group Note  Date:  05/13/2017 Time:  3:25 AM  Group Topic/Focus:  Wrap-Up Group:   The focus of this group is to help patients review their daily goal of treatment and discuss progress on daily workbooks.  Participation Level:  Active  Participation Quality:  Appropriate  Affect:  Appropriate  Cognitive:  Appropriate  Insight: Appropriate  Engagement in Group:  Engaged  Modes of Intervention:  Discussion  Additional Comments:  Pt stated his  goal for today was to talk with his doctor about her discharge plan. Pt stated he accomplished his goal and felt good about it. Pt rated his day a 10. Pt stated he attended all groups held today.  Felipa Furnace 05/13/2017, 3:25 AM

## 2017-05-13 NOTE — Progress Notes (Signed)
Patient has been a little more isolative to his room tonight and did not attend group. He reported that he was tired. Writer encouraged him to come to dayroom for snack and come out of his room if feeling up to it. He showered and took his scheduled medications and is currently preparing for bed. He denies si/hi/a/v hallucinations. He reports that he is ready to go home and will apply for a job so that he won't bored. Close observation continues and patient is safe.

## 2017-05-13 NOTE — Progress Notes (Signed)
Psychoeducational Group Note  Date:  05/13/2017 Time:  2030  Group Topic/Focus:  Wrap up group  Participation Level: Did Not Attend  Participation Quality:  Not Applicable  Affect:  Not Applicable  Cognitive:  Not Applicable  Insight:  Not Applicable  Engagement in Group: Not Applicable  Additional Comments: Pt was notified that group was beginning but remained in room. Pt considered  attending group but decided against it.   Johann Capers S 05/13/2017, 9:08 PM

## 2017-05-13 NOTE — Progress Notes (Signed)
Patient is currently lying in bed asleep with eyes closed and respirations even and unlabored. No distress noted. Close observation continues and patient is safe.

## 2017-05-13 NOTE — Progress Notes (Signed)
D: Pt awake in room on initial contact. Denies SI, HI, AVH and pain "not right now" unable to contract for safety "I don't know" when asked by Clinical research associate.  Presents with depressed affect and mood but brightened up on interaction. Compliant with medications when offered. Denies adverse drug reactions at this time.  A: Encouraged pt to voice concerns. Medications given as per MD's orders. Emotional support offered to pt. 1:1 safety checks continues without self harm gestures to note at this time. Assigned staff in attendance at all times.  R: Pt Tolerates all PO intake well. Remains safe on unit. Denies concerns at this time. POC remains effective for safety and mood stability.

## 2017-05-13 NOTE — Progress Notes (Signed)
D: Pt tolerated dinner well. Verbalized concern about possible discharge date "when am I going home Marvin Crawford". Awake in bed at this time. Did not attend group this shift despite prompts. A: Evening medications offered. Continued support and support provided to pt. Discharge criteria reviewed with pt, encouraged him to further discuss discharge with assigned MD. 1:1 observation continues for safety without self harm gestures at this time.  R: Pt verbalized understanding related to d/c criteria when explained. Denies further concerns at this time. Remains safe on unit.

## 2017-05-13 NOTE — [facility] Group Notes (Signed)
[facility] LCSW Group Therapy Note  Date/Time:  05/13/2017  11:00AM-12:00PM  Type of Therapy and Topic:  Group Therapy:  Music and Mood  Participation Level:  Did Not Attend   Description of Group: In this process group, members listened to a variety of genres of music and identified that different types of music evoke different responses.  Patients were encouraged to identify music that was soothing for them and music that was energizing for them.  Patients discussed how this knowledge can help with wellness and recovery in various ways including managing depression and anxiety as well as encouraging healthy sleep habits.    Therapeutic Goals: 1. Patients will explore the impact of different varieties of music on mood 2. Patients will verbalize the thoughts they have when listening to different types of music 3. Patients will identify music that is soothing to them as well as music that is energizing to them 4. Patients will discuss how to use this knowledge to assist in maintaining wellness and recovery 5. Patients will explore the use of music as a coping skill  Summary of Patient Progress:  N/A  Therapeutic Modalities: Solution Focused Brief Therapy Motivational Interviewing Activity   Ambrose Mantle, LCSW 05/13/2017 8:33 AM

## 2017-05-13 NOTE — Progress Notes (Signed)
Patient is currently up preparing to come to the dayroom for his vitals. He had his vitals taken and is watching tv until his breakfast arrives on the unit. He reports resting well last night. Close observation continues and patient is safe.

## 2017-05-13 NOTE — Progress Notes (Signed)
D: Pt awake in room at present verbally engaged with assigned staff. Ate approximately 25% of lunch "It's nasty, I don't like that". A: Support and encouragement provided to pt. 1:1 observation continues for safety as per order. Staff in attendance at all time.  R: Pt remains safe on unit. POC continues.

## 2017-05-14 NOTE — Progress Notes (Signed)
Close Observation Note: 1850  Patient sitting on his bad.  Respirations even and unlabored.  No signs/symptoms of pain/distress noted on patient's face/body movements.  Visitor in patient's room talking to him.  Close observation MHT in patient's room.  Close observation continues per MD order.

## 2017-05-14 NOTE — Progress Notes (Signed)
1030  Close Observation Note: Patient in bathroom.  Close observation continues for safety.  Patient has denied SI while talking to nurse, then changes his mind later and says he is SI.  Told MHT that he is SI.  Denied HI.  Denied A/V hallucinations.  Patient ate one container of cereal this morning for breakfast.  Respirations even and unlabored.  No signs/symptoms of pain/distress noted on patient's face/body movements.  1:1 continues for patient's safety per MD order.

## 2017-05-14 NOTE — Progress Notes (Signed)
D:  Patient's self inventory sheet, patient has poor sleep, sleep medication not helpful.  Poor appetite, low energy level, poor concentration.  Rated depression, hopeless and anxiety 10.  Withdrawals, tremors, sedation, diarrhea, chilling, craving, cramping, agitation, nausea, runny nose, irritability.  SI, almost all the time.  Contracts for safety.   Physical problems, dizziness, lightheaded, pain, headaches, rash, blurred vision.  Physical pain, worst pain in past 24 hours is #10, chest.  Pain medication is helpful.  Goal is attend group.  Plans to get new meds.  Does have discharge plans. A:  Medications administered per MD orders.  Emotional support and encouragement given patient. R:  Denied SI off/on, contracts for safety.  Has told nurse several times this morning not suicidal, then he is suicidal.  Denied hearing voices and then stated he does hear voices.  Denied visual hallucinations.  Denied pain.  Stated he does not have any withdrawals or pain.  Safety maintained with close observation per MD orders.

## 2017-05-14 NOTE — Progress Notes (Signed)
Close obs Pt is pleasant on approach and he is cooperative    He took a shower and has been visible on the milieu    He continues to report passive SI but smiles inappropriately when talking about it like its a game he is playing    Pt is on close obs for safety   Safety maintained

## 2017-05-14 NOTE — Plan of Care (Signed)
Problem: Coping: Goal: Ability to cope will improve Outcome: Progressing Nurse reviewed coping skills/depression/anxiety with patient.

## 2017-05-14 NOTE — Progress Notes (Signed)
Progress note - Late entry for close observation - Patient currently lying in bed awake, rested well last night, Patient voiced no complaints. Close observation continues, patient is safe.

## 2017-05-14 NOTE — Progress Notes (Addendum)
1610  Close Observation Note: Patient has been laying in bed with eyes closed.  Respirations even and unlabored.  No signs/symptoms of pain/distress noted on patient's face/body movements.  Patient took morning medications.  Patient safety maintained with close observation per MD order.

## 2017-05-14 NOTE — Progress Notes (Signed)
Close Observation Note: 1500  Patient has been in his room after lunch.  Close observation continues.  Patient has talked on phone to family member.  Respirations even and unlabored.  No signs/symptoms of pain/distress noted on patient's face/body movements.  Patient denied SI and HI.  Denied A/V hallucinations.  Close observation continues per MD order.

## 2017-05-14 NOTE — Tx Team (Signed)
Interdisciplinary Treatment and Diagnostic Plan Update  05/14/2017 Time of Session: 8:29 AM  Marvin Crawford MRN: [id number]  Principal Diagnosis: MDD (major depressive disorder), recurrent, severe, with psychosis (HCC)  Secondary Diagnoses: Principal Problem:   MDD (major depressive disorder), recurrent, severe, with psychosis (HCC) Active Problems:   Stuttering   Cannabis use disorder, moderate, dependence (HCC)   Borderline intellectual functioning   Current Medications:  Current Facility-Administered Medications  Medication Dose Route Frequency Provider Last Rate Last Dose  . acetaminophen (TYLENOL) tablet 650 mg  650 mg Oral Q6H PRN Kerry Hough, PA-C   650 mg at 05/11/17 2124  . alum & mag hydroxide-simeth (MAALOX/MYLANTA) 200-200-20 MG/5ML suspension 30 mL  30 mL Oral Q4H PRN Kerry Hough, PA-C      . escitalopram (LEXAPRO) tablet 20 mg  20 mg Oral Daily Donell Sievert E, PA-C   20 mg at 05/13/17 0813  . famotidine (PEPCID) tablet 20 mg  20 mg Oral BID Kerry Hough, PA-C   20 mg at 05/13/17 1705  . hydrOXYzine (ATARAX/VISTARIL) tablet 25 mg  25 mg Oral Q6H PRN Kerry Hough, PA-C   25 mg at 05/14/17 9629  . magnesium hydroxide (MILK OF MAGNESIA) suspension 30 mL  30 mL Oral Daily PRN Kerry Hough, PA-C      . QUEtiapine (SEROQUEL) tablet 25 mg  25 mg Oral BID Cobos, Rockey Situ, MD   25 mg at 05/13/17 1705  . QUEtiapine (SEROQUEL) tablet 75 mg  75 mg Oral QHS Jomarie Longs, MD   75 mg at 05/13/17 2205  . traZODone (DESYREL) tablet 50 mg  50 mg Oral QHS PRN Cobos, Rockey Situ, MD   50 mg at 05/13/17 2205    PTA Medications: Prescriptions Prior to Admission  Medication Sig Dispense Refill Last Dose  . amoxicillin-clavulanate (AUGMENTIN) 875-125 MG tablet Take 1 tablet by mouth 2 (two) times daily. One po bid x 7 days (Patient not taking: Reported on 05/08/2017) 14 tablet 0 Completed Course at Unknown time  . ARIPiprazole (ABILIFY) 15 MG tablet Take 1 tablet (15  mg total) by mouth 2 (two) times daily. 30 tablet 0 05/07/2017 at Unknown time  . benztropine (COGENTIN) 0.5 MG tablet Take 1 tablet (0.5 mg total) by mouth at bedtime. (Patient not taking: Reported on 03/15/2016) 30 tablet 0 Not Taking at Unknown time  . docusate sodium (COLACE) 100 MG capsule Take 1 capsule (100 mg total) by mouth every 12 (twelve) hours. (Patient not taking: Reported on 05/08/2017) 60 capsule 0 Completed Course at Unknown time  . escitalopram (LEXAPRO) 20 MG tablet Take 1 tablet (20 mg total) by mouth daily. 30 tablet 0 Past Week at Unknown time  . famotidine (PEPCID) 20 MG tablet Take 1 tablet (20 mg total) by mouth 2 (two) times daily. 60 tablet 0 Past Week at Unknown time  . hydrocortisone (ANUSOL-HC) 2.5 % rectal cream Apply rectally 2 times daily 28 g 1 Past Month at Unknown time  . hydrOXYzine (ATARAX/VISTARIL) 25 MG tablet Take 1 tablet (25 mg total) by mouth daily as needed for anxiety. (Patient not taking: Reported on 03/15/2016) 30 tablet 0 Not Taking at Unknown time  . ibuprofen (ADVIL,MOTRIN) 600 MG tablet Take 1 tablet (600 mg total) by mouth every 6 (six) hours as needed. (Patient not taking: Reported on 05/08/2017) 30 tablet 0 Completed Course at Unknown time  . ibuprofen (ADVIL,MOTRIN) 800 MG tablet Take 1 tablet (800 mg total) by mouth 3 (three) times daily. (  Patient not taking: Reported on 05/08/2017) 21 tablet 0 Completed Course at Unknown time  . traZODone (DESYREL) 150 MG tablet Take 1 tablet (150 mg total) by mouth at bedtime as needed for sleep. (Patient not taking: Reported on 03/15/2016) 30 tablet 0 Not Taking at Unknown time    Treatment Modalities: Medication Management, Group therapy, Case management,  1 to 1 session with clinician, Psychoeducation, Recreational therapy.  Patient Stressors: Medication change or noncompliance Substance abuse  Patient Strengths: Astronomer fund of knowledge Supportive family/friends Work Librarian, academic for Primary Diagnosis: MDD (major depressive disorder), recurrent, severe, with psychosis (HCC) Long Term Goal(s): Improvement in symptoms so as ready for discharge  Short Term Goals: Ability to identify changes in lifestyle to reduce recurrence of condition will improve Ability to verbalize feelings will improve Ability to disclose and discuss suicidal ideas Compliance with prescribed medications will improve Ability to verbalize feelings will improve Ability to disclose and discuss suicidal ideas Compliance with prescribed medications will improve  Medication Management: Evaluate patient's response, side effects, and tolerance of medication regimen.  Therapeutic Interventions: 1 to 1 sessions, Unit Group sessions and Medication administration.  Evaluation of Outcomes: Adequate for Discharge  Physician Treatment Plan for Secondary Diagnosis: Principal Problem:   MDD (major depressive disorder), recurrent, severe, with psychosis (HCC) Active Problems:   Stuttering   Cannabis use disorder, moderate, dependence (HCC)   Borderline intellectual functioning  Long Term Goal(s): Improvement in symptoms so as ready for discharge  Short Term Goals: Ability to identify changes in lifestyle to reduce recurrence of condition will improve Ability to verbalize feelings will improve Ability to disclose and discuss suicidal ideas Compliance with prescribed medications will improve Ability to verbalize feelings will improve Ability to disclose and discuss suicidal ideas Compliance with prescribed medications will improve  Medication Management: Evaluate patient's response, side effects, and tolerance of medication regimen.  Therapeutic Interventions: 1 to 1 sessions, Unit Group sessions and Medication administration.  Evaluation of Outcomes: Adequate for Discharge   RN Treatment Plan for Primary Diagnosis: MDD (major depressive disorder), recurrent, severe, with psychosis  (HCC) Long Term Goal(s): Knowledge of disease and therapeutic regimen to maintain health will improve  Short Term Goals: Ability to remain free from injury will improve, Ability to verbalize feelings will improve, Ability to identify and develop effective coping behaviors will improve and Compliance with prescribed medications will improve  Medication Management: RN will administer medications as ordered by provider, will assess and evaluate patient's response and provide education to patient for prescribed medication. RN will report any adverse and/or side effects to prescribing provider.  Therapeutic Interventions: 1 on 1 counseling sessions, Psychoeducation, Medication administration, Evaluate responses to treatment, Monitor vital signs and CBGs as ordered, Perform/monitor CIWA, COWS, AIMS and Fall Risk screenings as ordered, Perform wound care treatments as ordered.  Evaluation of Outcomes: Adequate for Discharge    Recreational Therapy Treatment Plan for Primary Diagnosis: MDD (major depressive disorder), recurrent, severe, with psychosis (HCC) Long Term Goal(s): Patient will participate in recreation therapy treatment in at least 2 group sessions without prompting from LRT  Short Term Goals: Patient will be able to identify at least 5 coping skills for admitting diagnosis by conclusion of recreation therapy treatment  Treatment Modalities: Group and Pet Therapy  Therapeutic Interventions: Psychoeducation  Evaluation of Outcomes: Adequate for Discharge   LCSW Treatment Plan for Primary Diagnosis: MDD (major depressive disorder), recurrent, severe, with psychosis (HCC) Long Term Goal(s): Safe transition to appropriate  next level of care at discharge, Engage patient in therapeutic group addressing interpersonal concerns.  Short Term Goals: Engage patient in aftercare planning with referrals and resources, Increase social support, Facilitate acceptance of mental health diagnosis and  concerns, Facilitate patient progression through stages of change regarding substance use diagnoses and concerns, Identify triggers associated with mental health/substance abuse issues and Increase skills for wellness and recovery  Therapeutic Interventions: Assess for all discharge needs, 1 to 1 time with Social worker, Explore available resources and support systems, Assess for adequacy in community support network, Educate family and significant other(s) on suicide prevention, Complete Psychosocial Assessment, Interpersonal group therapy.  Evaluation of Outcomes: Adequate for Discharge   Progress in Treatment: Attending groups: Yes Participating in groups: Yes Taking medication as prescribed: Yes Toleration of medication: Yes, no side effects reported at this time Family/Significant other contact made: Valentino Hue [name] [phone] (Mother) Patient understands diagnosis: No, limited insight Discussing patient identified problems/goals with staff: Yes Medical problems stabilized or resolved: Yes Denies suicidal/homicidal ideation: Yes Issues/concerns per patient self-inventory: None Other: N/A  New problem(s) identified: None identified at this time.   New Short Term/Long Term Goal(s): Pt wants help with "Being more social"   Discharge Plan or Barriers: Pt will return home with mother  Reason for Continuation of Hospitalization:  Medication stabilization    Estimated Length of Stay: Likely d/c tomorrow, Wednesday at the latest  Attendees: Patient:   05/14/2017  8:29 AM  Physician: Nehemiah Massed, MD 05/14/2017  8:29 AM  Nursing: Quintella Reichert, RN 05/14/2017  8:29 AM  RN Care Manager:  05/14/2017  8:29 AM  Social Worker: Richelle Ito;  05/14/2017  8:29 AM  Recreational Therapist:  Huston Foley LRT 05/14/2017  8:29 AM  Other:  05/14/2017  8:29 AM  Other:  05/14/2017  8:29 AM  Other: 05/14/2017  8:29 AM    Scribe for Treatment Team: Ida Rogue, LCSW 05/14/2017 8:29 AM

## 2017-05-14 NOTE — Progress Notes (Signed)
Recreation Therapy Notes  Date: 05/14/17 Time: 1000 Location: 500 Hall Dayroom  Group Topic: Coping Skills  Goal Area(s) Addresses:  Patients will be able to identify positive coping skills. Patients will be able to identify the benefits of using coping skills post d/c.  Behavioral Response: Minimal  Intervention:  Worksheet, pencils  Activity: Mind map.  Patients were given a blank mind map.  LRT and patients filled in the first eight boxes together with things that would cause them to use coping skills.  Patients identified depression, anxiety, family, resentment, anguish, placement, tiredness and loneliness.  Patients were to then identify three coping skills for each area identified.  Education: Pharmacologist, Building control surveyor.   Education Outcome: Acknowledges understanding/In group clarification offered/Needs additional education.   Clinical Observations/Feedback: Pt filled out the first part of his sheet.  LRT assisted pt with filling in the coping skills.  Pt left before processing and did not return.    Caroll Rancher, LRT/CTRS         Caroll Rancher A 05/14/2017 12:53 PM

## 2017-05-14 NOTE — Progress Notes (Signed)
[facility] MD Progress Note  05/14/2017 5:20 PM Marvin Crawford  MRN:  [id number]   Subjective: Gleason reports " I am doing and feeling better today, I feel ready to go home."   Objective:  Patient to continue with 1:1 for safety.Marvin Crawford is awake, alert and oriented.  Denies suicidal or homicidal ideation. Denies auditory or visual hallucination and does not appear to be responding to internal stimuli.Reports taken medication as prescribed and states he is tolerating medications well. Seroquel was recently increased.  Per CSW patient to have a family session, pending outcome will determine patients dispositions. Patient's mood and affect appears to be improving. Pt with borderline IQ- Support, encouragement  and reassurance was provided.   Principal Problem: MDD (major depressive disorder), recurrent, severe, with psychosis (HCC) Diagnosis:   Patient Active Problem List   Diagnosis Date Noted  . 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 Stuttering [F80.81] 08/09/2015  . Cannabis use disorder, moderate, dependence (HCC) [F12.20] 08/09/2015  . MDD (major depressive disorder), recurrent, severe, with psychosis (HCC) [F33.3] 02/16/2013   Total Time spent with patient: 25 minutes  Past Psychiatric History: Please see H&P.   Past Medical History:  Past Medical History:  Diagnosis Date  . ADD (attention deficit disorder)   . Anxiety   . Depression   . Environmental allergies    History reviewed. No pertinent surgical history. Family History:  Family History  Problem Relation Age of Onset  . Hypertension Mother   . Hypertension Father   . Mental illness Neg Hx    Family Psychiatric  History: Please see H&P.  Social History:  History  Alcohol Use No     History  Drug Use  . Types: Other-see comments, Marijuana    Comment: oil smoked in pipe calls "DAB", friends buy for him    Social History   Social History  . Marital status: Single    Spouse name: N/A  .  Number of children: N/A  . Years of education: N/A   Social History Main Topics  . Smoking status: Current Every Day Smoker    Packs/day: 2.00  . Smokeless tobacco: Never Used  . Alcohol use No  . Drug use: Yes    Types: Other-see comments, Marijuana     Comment: oil smoked in pipe calls "DAB", friends buy for him  . Sexual activity: Yes    Birth control/ protection: Condom   Other Topics Concern  . None   Social History Narrative  . None   Additional Social History:                         Sleep: Fair  Appetite:  Fair  Current Medications: Current Facility-Administered Medications  Medication Dose Route Frequency Provider Last Rate Last Dose  . acetaminophen (TYLENOL) tablet 650 mg  650 mg Oral Q6H PRN Kerry Hough, PA-C   650 mg at 05/11/17 2124  . alum & mag hydroxide-simeth (MAALOX/MYLANTA) 200-200-20 MG/5ML suspension 30 mL  30 mL Oral Q4H PRN Kerry Hough, PA-C      . escitalopram (LEXAPRO) tablet 20 mg  20 mg Oral Daily Donell Sievert E, PA-C   20 mg at 05/14/17 1649  . famotidine (PEPCID) tablet 20 mg  20 mg Oral BID Kerry Hough, PA-C   20 mg at 05/14/17 1649  . hydrOXYzine (ATARAX/VISTARIL) tablet 25 mg  25 mg Oral Q6H PRN Kerry Hough, PA-C   25 mg at 05/14/17  0829  . magnesium hydroxide (MILK OF MAGNESIA) suspension 30 mL  30 mL Oral Daily PRN Donell Sievert E, PA-C      . QUEtiapine (SEROQUEL) tablet 25 mg  25 mg Oral BID Cobos, Rockey Situ, MD   25 mg at 05/14/17 1649  . QUEtiapine (SEROQUEL) tablet 75 mg  75 mg Oral QHS Jomarie Longs, MD   75 mg at 05/13/17 2205  . traZODone (DESYREL) tablet 50 mg  50 mg Oral QHS PRN Cobos, Rockey Situ, MD   50 mg at 05/13/17 2205    Lab Results:  No results found for this or any previous visit (from the past 48 hour(s)).  Blood Alcohol level:  Lab Results  Component Value Date   ETH <10 05/08/2017   ETH <5 09/09/2015    Metabolic Disorder Labs: Lab Results  Component Value Date   HGBA1C  5.2 05/10/2017   MPG 102.54 05/10/2017   MPG 117 09/11/2015   Lab Results  Component Value Date   PROLACTIN 6.5 05/10/2017   PROLACTIN 2.1 (L) 09/11/2015   Lab Results  Component Value Date   CHOL 125 05/10/2017   TRIG 54 05/10/2017   HDL 35 (L) 05/10/2017   CHOLHDL 3.6 05/10/2017   VLDL 11 05/10/2017   LDLCALC 79 05/10/2017   LDLCALC 79 09/11/2015    Physical Findings: AIMS: Facial and Oral Movements Muscles of Facial Expression: None, normal Lips and Perioral Area: None, normal Jaw: None, normal Tongue: None, normal,Extremity Movements Upper (arms, wrists, hands, fingers): None, normal Lower (legs, knees, ankles, toes): None, normal, Trunk Movements Neck, shoulders, hips: None, normal, Overall Severity Severity of abnormal movements (highest score from questions above): None, normal Incapacitation due to abnormal movements: None, normal Patient's awareness of abnormal movements (rate only patient's report): No Awareness, Dental Status Current problems with teeth and/or dentures?: No Does patient usually wear dentures?: No  CIWA:  CIWA-Ar Total: 1 COWS:  COWS Total Score: 2  Musculoskeletal: Strength & Muscle Tone: within normal limits Gait & Station: normal Patient leans: N/A  Psychiatric Specialty Exam: Physical Exam  Nursing note and vitals reviewed. Constitutional: He is oriented to person, place, and time. He appears well-developed and well-nourished.  Neurological: He is alert and oriented to person, place, and time.  Psychiatric: He has a normal mood and affect. His behavior is normal.    Review of Systems  Psychiatric/Behavioral: Positive for depression and hallucinations. The patient is nervous/anxious.   All other systems reviewed and are negative.   Blood pressure 111/67, pulse 74, temperature 98.3 F (36.8 C), temperature source Oral, resp. rate 16, height  (1.854 m), weight 105.2 kg (232 lb).Body mass index is 30.61 kg/m.  General Appearance:  Casual  smiling and pleasant during this assessment  Eye Contact:  Fair  Speech:  Slow  Volume:  Decreased  Mood:  Anxious and Dysphoric  Affect:  Congruent  Thought Process:  Linear and Descriptions of Associations: Circumstantial  Orientation:  Other:  self, situation  Thought Content:  Rumination  Suicidal Thoughts:  No denied during this assessment  Homicidal Thoughts:  No  Memory:  Immediate;   Fair Recent;   Fair Remote;   Fair  Judgement:  Impaired  Insight:  Lacking  Psychomotor Activity:  Normal visible throughout the milieu   Concentration:  Concentration: Fair and Attention Span: Fair  Recall:  [id number] of Knowledge:  Fair  Language:  Fair  Akathisia:  No  Handed:  Right  AIMS (if indicated):  Assets:  Desire for Improvement Resilience Social Support  ADL's:  Intact  Cognition:  WNL  Sleep:  Number of Hours: 6.25     I agree with current treatment plan on 05/14/2017, Patient seen face-to-face for psychiatric evaluation follow-up, chart reviewed. Reviewed the information documented and agree with the treatment plan.    Treatment Plan Summary:  Daily contact with patient to assess and evaluate symptoms and progress in treatment and Medication management   Will continue today 05/14/17  plan as below except where it is noted.  Continue Lexapro 20  mg po daily for affective sx. Continue Hydroxyzine 25 mg prn for anxiety Q 6 hours. Continue Seroquel 25 mg bid for agitation. Continue Seroquel to 75 mg po qhs for psychosis/mood sx. Continue Trazodone 50 mg prn Q hs for insomnia. Continue with close observation for safety concerns CSW will continue to work on disposition.  Oneta Rack, NP 05/14/2017, 5:20 PM Agree with NP Progress Note

## 2017-05-14 NOTE — Progress Notes (Signed)
Close Observation Note: 1730  Patient has been laying in his bed this afternoon and MHT present for close observation.   Patient denied SI and HI.  Denied A/V hallucinations.  Denied pain.  Respirations even and unlabored.  No signs/symptoms of pain/distress noted on patient's face/body movements.  Safety maintained with close observation per MD order.

## 2017-05-14 NOTE — [facility] Group Notes (Signed)
LCSW Group Therapy Note   05/14/2017 1:15pm   Type of Therapy and Topic:  Group Therapy:  Overcoming Obstacles   Participation Level:  Minimal   Description of Group:    In this group patients will be encouraged to explore what they see as obstacles to their own wellness and recovery. They will be guided to discuss their thoughts, feelings, and behaviors related to these obstacles. The group will process together ways to cope with barriers, with attention given to specific choices patients can make. Each patient will be challenged to identify changes they are motivated to make in order to overcome their obstacles. This group will be process-oriented, with patients participating in exploration of their own experiences as well as giving and receiving support and challenge from other group members.   Therapeutic Goals: 1. Patient will identify personal and current obstacles as they relate to admission. 2. Patient will identify barriers that currently interfere with their wellness or overcoming obstacles.  3. Patient will identify feelings, thought process and behaviors related to these barriers. 4. Patient will identify two changes they are willing to make to overcome these obstacles:      Summary of Patient Progress   Came initially, and was engaged while present.  Contributed to our discussion of what an obstacle is.  However, he left before he talked about his own obstacle, and did not return.   Therapeutic Modalities:   Cognitive Behavioral Therapy Solution Focused Therapy Motivational Interviewing Relapse Prevention Therapy  Ida Rogue, LCSW 05/14/2017 4:31 PM

## 2017-05-14 NOTE — Progress Notes (Signed)
Patient is currently lying in bed asleep snoring. Close observation continues and patient is safe.

## 2017-05-15 DIAGNOSIS — F1910 Other psychoactive substance abuse, uncomplicated: Secondary | ICD-10-CM

## 2017-05-15 DIAGNOSIS — F22 Delusional disorders: Secondary | ICD-10-CM

## 2017-05-15 DIAGNOSIS — R441 Visual hallucinations: Secondary | ICD-10-CM

## 2017-05-15 DIAGNOSIS — R440 Auditory hallucinations: Secondary | ICD-10-CM

## 2017-05-15 MED ORDER — HYDROXYZINE HCL 25 MG PO TABS: 25.0000 mg | ORAL_TABLET | Freq: Four times a day (QID) | ORAL | 0 refills | Status: DC | PRN

## 2017-05-15 MED ORDER — ESCITALOPRAM OXALATE 20 MG PO TABS: 20.0000 mg | ORAL_TABLET | Freq: Every day | ORAL | 0 refills | Status: DC

## 2017-05-15 MED ORDER — TRAZODONE HCL 50 MG PO TABS: 50.0000 mg | ORAL_TABLET | Freq: Every evening | ORAL | 0 refills | Status: DC | PRN

## 2017-05-15 MED ORDER — QUETIAPINE FUMARATE 25 MG PO TABS: 25.0000 mg | ORAL_TABLET | Freq: Two times a day (BID) | ORAL | 0 refills | Status: DC

## 2017-05-15 NOTE — Discharge Summary (Signed)
Physician Discharge Summary Note  Patient:  Marvin Crawford is an 21 y.o., male MRN:  [id number] DOB:  July 29, 1996 Patient phone:  [phone] (home)  Patient address:   [address] [place] [address],  Total Time spent with patient: 30 minutes  Date of Admission:  05/09/2017 Date of Discharge: 05/15/2017  Reason for Admission:  Per admission assessment-  Marvin Crawford is a 21 year old male.He came in with his mother for suicidal ideation with plan to be hit by a car. He had recent hospitalization at [facility] for stepping out of a vehicle when [place] [address]. He is reporting hearing auditory hallucinations telling him he could do better and visual hallucinations of black and white images. He reported using cannabis and having behavior changes with the cannabis. He is diagnosed with Major Depressive Disorder with psychosis. During [facility] admission, he continues to report suicidal ideation and is able to contract for safety on the unit. He reports hearing voices telling him to hurt himself. He denies HI. He denies any pain or discomfort and appears to be in no physical distress. Oriented him to the unit. Admission paperwork completed and signed. Belongings searched and no locker needed on admission. Skin assessment completed and no skin issuesnoted. Q 15 minute checks initiated for safety. We will monitor the progress towards his goals.  Principal Problem: MDD (major depressive disorder), recurrent, severe, with psychosis [facility]) Discharge Diagnoses: Patient Active Problem List   Diagnosis Date Noted  . 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 Stuttering [F80.81] 08/09/2015  . Cannabis use disorder, moderate, dependence (HCC) [F12.20] 08/09/2015  . MDD (major depressive disorder), recurrent, severe, with psychosis (HCC) [F33.3] 02/16/2013    Past Psychiatric History:   Past Medical History:  Past Medical History:  Diagnosis Date  . ADD (attention deficit  disorder)   . Anxiety   . Depression   . Environmental allergies    History reviewed. No pertinent surgical history. Family History:  Family History  Problem Relation Age of Onset  . Hypertension Mother   . Hypertension Father   . Mental illness Neg Hx    Family Psychiatric  History:  Social History:  History  Alcohol Use No     History  Drug Use  . Types: Other-see comments, Marijuana    Comment: oil smoked in pipe calls "DAB", friends buy for him    Social History   Social History  . Marital status: Single    Spouse name: N/A  . Number of children: N/A  . Years of education: N/A   Social History Main Topics  . Smoking status: Current Every Day Smoker    Packs/day: 2.00  . Smokeless tobacco: Never Used  . Alcohol use No  . Drug use: Yes    Types: Other-see comments, Marijuana     Comment: oil smoked in pipe calls "DAB", friends buy for him  . Sexual activity: Yes    Birth control/ protection: Condom   Other Topics Concern  . None   Social History Narrative  . None    Hospital Course:  JARAY BOLIVER was admitted for MDD (major depressive disorder), recurrent, severe, with psychosis (HCC) , with psychosis and crisis management.  Pt was treated discharged with the medications listed below under Medication List.  Medical problems were identified and treated as needed.  Home medications were restarted as appropriate.  Improvement was monitored by observation and [name] 's daily report of symptom reduction.  Emotional and mental status was  monitored by daily self-inventory reports completed by [name] and clinical staff.         YASSIR ENIS was evaluated by the treatment team for stability and plans for continued recovery upon discharge. JOANTHONY HAMZA 's motivation was an integral factor for scheduling further treatment. Employment, transportation, bed availability, health status, family support, and any pending legal issues were also considered  during hospital stay. Pt was offered further treatment options upon discharge including but not limited to Residential, Intensive Outpatient, and Outpatient treatment.  OMARI MCMANAWAY will follow up with the services as listed below under Follow Up Information. Patient will be provided with substance abuse follow-up information. [facility] [place].    Upon completion of this admission the patient was both mentally and medically stable for discharge denying suicidal/homicidal ideation, auditory/visual/tactile hallucinations, delusional thoughts and paranoia.     Ayesha Rumpf Forge responded well to treatment with Lexpro 20 mg and Seroquel 25 mg BID and 75 mg PO QHS  without adverse effects.  Pt demonstrated improvement without reported or observed adverse effects to the point of stability appropriate for outpatient management. Pertinent labs include: Lipid elevation for which outpatient follow-up is necessary for lab recheck as mentioned below. Reviewed CBC, CMP, BAL, and UDS; all unremarkable aside from noted exceptions.   Physical Findings: AIMS: Facial and Oral Movements Muscles of Facial Expression: None, normal Lips and Perioral Area: None, normal Jaw: None, normal Tongue: None, normal,Extremity Movements Upper (arms, wrists, hands, fingers): None, normal Lower (legs, knees, ankles, toes): None, normal, Trunk Movements Neck, shoulders, hips: None, normal, Overall Severity Severity of abnormal movements (highest score from questions above): None, normal Incapacitation due to abnormal movements: None, normal Patient's awareness of abnormal movements (rate only patient's report): No Awareness, Dental Status Current problems with teeth and/or dentures?: No Does patient usually wear dentures?: No  CIWA:  CIWA-Ar Total: 1 COWS:  COWS Total Score: 2  Musculoskeletal: Strength & Muscle Tone: within normal limits Gait & Station: normal Patient leans: N/A  Psychiatric Specialty Exam:  See SRA by MD Physical Exam  Vitals reviewed. Constitutional: He is oriented to person, place, and time. He appears well-developed.  Neurological: He is alert and oriented to person, place, and time.  Psychiatric: He has a normal mood and affect. His behavior is normal.    Review of Systems  Psychiatric/Behavioral: Positive for substance abuse. Negative for depression (stable) and suicidal ideas. The patient is not nervous/anxious and does not have insomnia.     Blood pressure (!) 111/94, pulse (!) 102, temperature 98.7 F (37.1 C), temperature source Oral, resp. rate 20, height  (1.854 m), weight 105.2 kg (232 lb).Body mass index is 30.61 kg/m.    Have you used any form of tobacco in the last 30 days? (Cigarettes, Smokeless Tobacco, Cigars, and/or Pipes): Yes  Has this patient used any form of tobacco in the last 30 days? (Cigarettes, Smokeless Tobacco, Cigars, and/or Pipes) Yes, No  Blood Alcohol level:  Lab Results  Component Value Date   ETH <10 05/08/2017   ETH <5 09/09/2015    Metabolic Disorder Labs:  Lab Results  Component Value Date   HGBA1C 5.2 05/10/2017   MPG 102.54 05/10/2017   MPG 117 09/11/2015   Lab Results  Component Value Date   PROLACTIN 6.5 05/10/2017   PROLACTIN 2.1 (L) 09/11/2015   Lab Results  Component Value Date   CHOL 125 05/10/2017   TRIG 54 05/10/2017   HDL 35 (  L) 05/10/2017   CHOLHDL 3.6 05/10/2017   VLDL 11 05/10/2017   LDLCALC 79 05/10/2017   LDLCALC 79 09/11/2015    See Psychiatric Specialty Exam and Suicide Risk Assessment completed by Attending Physician prior to discharge.  Discharge destination:  Home  Is patient on multiple antipsychotic therapies at discharge:  No   Has Patient had three or more failed trials of antipsychotic monotherapy by history:  No  Recommended Plan for Multiple Antipsychotic Therapies: NA  Discharge Instructions    Diet - low sodium heart healthy    Complete by:  As directed    Discharge  instructions    Complete by:  As directed    Take all medications as prescribed. Keep all follow-up appointments as scheduled.  Do not consume alcohol or use illegal drugs while on prescription medications. Report any adverse effects from your medications to your primary care provider promptly.  In the event of recurrent symptoms or worsening symptoms, call 911, a crisis hotline, or go to the nearest emergency department for evaluation.   Increase activity slowly    Complete by:  As directed      Allergies as of 05/15/2017   No Known Allergies     Medication List    STOP taking these medications   amoxicillin-clavulanate 875-125 MG tablet Commonly known as:  AUGMENTIN   ARIPiprazole 15 MG tablet Commonly known as:  ABILIFY   benztropine 0.5 MG tablet Commonly known as:  COGENTIN   docusate sodium 100 MG capsule Commonly known as:  COLACE   hydrocortisone 2.5 % rectal cream Commonly known as:  ANUSOL-HC   ibuprofen 600 MG tablet Commonly known as:  ADVIL,MOTRIN   ibuprofen 800 MG tablet Commonly known as:  ADVIL,MOTRIN     TAKE these medications     Indication  escitalopram 20 MG tablet Commonly known as:  LEXAPRO Take 1 tablet (20 mg total) by mouth daily.  Indication:  Major Depressive Disorder   famotidine 20 MG tablet Commonly known as:  PEPCID Take 1 tablet (20 mg total) by mouth 2 (two) times daily.  Indication:  Gastroesophageal Reflux Disease   hydrOXYzine 25 MG tablet Commonly known as:  ATARAX/VISTARIL Take 1 tablet (25 mg total) by mouth every 6 (six) hours as needed for anxiety. What changed:  when to take this  Indication:  Feeling Anxious   QUEtiapine 25 MG tablet Commonly known as:  SEROQUEL Take 1 tablet (25 mg total) by mouth 2 (two) times daily. Take 3 tablet (  total) by mouth at bed time  Indication:  mood stababilization   traZODone 50 MG tablet Commonly known as:  DESYREL Take 1 tablet (50 mg total) by mouth at bedtime as needed  for sleep. What changed:  medication strength  how much to take  Indication:  Major Depressive Disorder      Follow-up Information    [facility] Follow up on 05/17/2017.   Why:  Thursday at 9:00 with Ms Wallace Cullens.  Then Monday, Nov 12 at 10:30 for medication management Contact information: [address] [place] [address] [phone]           Follow-up recommendations:  Activity:  as tolerated Diet:  heart healthy Other:  [facility] [place]   Comments: Take all medications as prescribed. Keep all follow-up appointments as scheduled.  Do not consume alcohol or use illegal drugs while on prescription medications. Report any adverse effects from your medications to your primary care provider  promptly.  In the event of recurrent symptoms or worsening symptoms, call 911, a crisis hotline, or go to the nearest emergency department for evaluation.   Signed: Oneta Rack, NP 05/15/2017, 9:24 AM

## 2017-05-15 NOTE — [facility] Suicide Risk Assessment (Signed)
[facility] Discharge Suicide Risk Assessment   Principal Problem: MDD (major depressive disorder), recurrent, severe, with psychosis ([place]) Discharge Diagnoses:  Patient Active Problem List   Diagnosis Date Noted  . 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 Stuttering [F80.81] 08/09/2015  . Cannabis use disorder, moderate, dependence ([place]) [F12.20] 08/09/2015  . MDD (major depressive disorder), recurrent, severe, with psychosis ([place]) [F33.3] 02/16/2013  patient is a 21 year old male admittedFor suicidal ideation with a plan to be hit by a car. Patient was recently hospitalized control and for stepping out a for BRCA1 driving on [address]. Patient reports that he is hearing voices telling him to do bad things, also reports visual hallucinations with black and white images.  Patient this morning reports that he is doing much better, adds that he's eating fine and sleeping well, is not having suicidal thoughts, denies any hallucinations and states that he had visitation with his mom yesterday and feels that he is ready for discharge. He denies any side effects of the medications, any concerns this morning  Total Time spent with patient: 30 minutes  Musculoskeletal: Strength & Muscle Tone: within normal limits Gait & Station: normal Patient leans: N/A  Psychiatric Specialty Exam: Review of Systems  Constitutional: Negative.  Negative for fever, malaise/fatigue and weight loss.  HENT: Negative.  Negative for congestion and sore throat.   Eyes: Negative.  Negative for blurred vision, double vision, discharge and redness.  Respiratory: Negative.  Negative for cough, shortness of breath and wheezing.   Cardiovascular: Negative.  Negative for chest pain and palpitations.  Gastrointestinal: Negative.  Negative for abdominal pain, constipation, diarrhea, heartburn, nausea and vomiting.  Musculoskeletal: Negative.  Negative for falls, joint pain and myalgias.  Skin: Negative.  Negative for  rash.  Neurological: Negative.  Negative for dizziness, weakness and headaches.  Endo/Heme/Allergies: Negative.  Negative for environmental allergies.  Psychiatric/Behavioral: Negative.  Negative for depression, hallucinations, memory loss, substance abuse and suicidal ideas. The patient is not nervous/anxious and does not have insomnia.     Blood pressure (!) 111/94, pulse (!) 102, temperature 98.7 F (37.1 C), temperature source Oral, resp. rate 20, height '6\' 1"'  (1.854 m), weight 105.2 kg (232 lb).Body mass index is 30.61 kg/m.  General Appearance: Casual  Eye Contact::  Fair  Speech:  Clear and Coherent and Normal Rate  Volume:  Normal  Mood:  Euthymic  Affect:  Congruent and Full Range  Thought Process:  Coherent, Goal Directed and Descriptions of Associations: Intact  Orientation:  Full (Time, Place, and Person)  Thought Content:  WDL  Suicidal Thoughts:  No  Homicidal Thoughts:  No  Memory:  Immediate;   Fair Recent;   Fair Remote;   Fair  Judgement:  Intact  Insight:  Fair  Psychomotor Activity:  Normal  Concentration:  Fair  Recall:  [employer] of Knowledge:Fair  Language: Fair  Akathisia:  No  Handed:  Right  AIMS (if indicated):     Assets:  Communication Skills Desire for Improvement Housing Physical Health  Sleep:  Number of Hours: 6  Cognition: WNL  ADL's:  Intact   Mental Status Per Nursing Assessment::   On Admission:  Suicidal ideation indicated by patient  Demographic Factors:  Male, Adolescent or young adult, Low socioeconomic status and Unemployed  Loss Factors: NA  Historical Factors: Prior suicide attempts and Impulsivity  Risk Reduction Factors:   Living with another person, especially a relative and Positive social support  Continued Clinical Symptoms:  Depression:   Impulsivity  Alcohol/Substance Abuse/Dependencies  Cognitive Features That Contribute To Risk:  None    Suicide Risk:  Minimal: No identifiable suicidal ideation.   Patients presenting with no risk factors but with morbid ruminations; may be classified as minimal risk based on the severity of the depressive symptoms  Follow-up [facility] Follow up on 05/17/2017.   Why:  Thursday at 9:00 with Ms Pearline Cables.  Then Monday, Nov 12 at 10:30 for medication management Contact information: [place] [address] [phone]           Plan Of Care/Follow-up recommendations:  Activity:  as tolerated Diet:  regular Other:  keep follow up appointment and take medicines as prescribed  Hampton Abbot, MD 05/15/2017, 10:05 AM

## 2017-05-15 NOTE — Progress Notes (Signed)
Recreation Therapy Notes  Date: 05/15/17 Time: 1000 Location: 500 Dayroom  Group Topic: Wellness  Goal Area(s) Addresses:  Patient will define components of whole wellness. Patient will verbalize benefit of whole wellness.  Intervention:  2 Decks of Cards   Activity: Deck of Chance.  LRT had 2 decks of cards.  Each patient was given 2 cards from one deck of playing cards.  LRT would pull a card from a separate deck of cards.  If a patients number was pulled, they had to perform the exercise that goes with that card.  Education: Wellness, Building control surveyor.   Education Outcome: Acknowledges education/In group clarification offered/Needs additional education.   Clinical Observations/Feedback:  Pt did not attend group.    Caroll Rancher, LRT/CTRS         Caroll Rancher A 05/15/2017 12:38 PM

## 2017-05-15 NOTE — Progress Notes (Signed)
BHH Post 1:1 Observation Documentation  For the first (8) hours following discontinuation of 1:1 precautions, a progress note entry by nursing staff should be documented at least every 2 hours, reflecting the patient's behavior, condition, mood, and conversation.  Use the progress notes for additional entries.  Time 1:1 discontinued:  1015  Patient's Behavior: Calm, cooperative with care and unit routines at this time.  Patient's Condition:Verbally engaged with staff appears to be in no physical distress at this time.     Patient's Conversation: "my grandma will come pick me up today".  Sherryl Manges 05/15/2017, 1015

## 2017-05-15 NOTE — Progress Notes (Signed)
B[facility] Post 1:1 Observation Documentation  For the first (8) hours following discontinuation of 1:1 precautions, a progress note entry by nursing staff should be documented at least every 2 hours, reflecting the patient's behavior, condition, mood, and conversation.  Use the progress notes for additional entries.  Time 1:1 discontinued:  1015  Patient's Behavior: Calm without outburst to note at present.    Patient's Condition: Ambulates with a steady gait. Appears to be in no physical distress at this time.   Patient's Conversation: "I'm going home today".  Sherryl Manges 05/15/2017, 1215

## 2017-05-15 NOTE — Plan of Care (Signed)
Problem: [facility] Participation in Recreation Therapeutic Interventions Goal: STG-Patient will identify at least five coping skills for ** STG: Coping Skills - Patient will be able to identify at least 5 coping skills for depression by conclusion of recreation therapy tx  Outcome: Adequate for Discharge Pt attended coping skills group as was able to identify a few coping skills during recreation therapy session.  Caroll Rancher, LRT/CTRS

## 2017-05-15 NOTE — Progress Notes (Signed)
  [facility] Adult Case Management Discharge Plan :  Will you be returning to the same living situation after discharge:  Yes,  home At discharge, do you have transportation home?: Yes,  grandmother Do you have the ability to pay for your medications: Yes,  mental health  Release of information consent forms completed and in the chart;  Patient's signature needed at discharge.  Patient to Follow up at: Follow-up Information    [facility] Follow up on 05/17/2017.   Why:  Thursday at 9:00 with Ms Wallace Cullens.  Then Monday, Nov 12 at 10:30 for medication management Contact information: [address] [place] [address] [phone]           Next level of care provider has access to [facility] Link:no  Safety Planning and Suicide Prevention discussed: Yes,  yes  Have you used any form of tobacco in the last 30 days? (Cigarettes, Smokeless Tobacco, Cigars, and/or Pipes): Yes  Has patient been referred to the Quitline?: Patient refused referral  Patient has been referred for addiction treatment: N/A  Ida Rogue, LCSW 05/15/2017, 10:31 AM

## 2017-05-15 NOTE — Progress Notes (Signed)
Close Obs Note Pt up and about on the unit   He is calm and cooperative   Reports sleeping ok    Pt is on close obs due to being unable to contract for safety   Safety maintained

## 2017-05-15 NOTE — Progress Notes (Signed)
D: Pt A & O X3. Denies SI, HI, AVH and pain when assessed. Verbally contracts for safety. Cooperative with care and unit routines.  A: Scheduled medications administered as prescribed with verbal education and effects monitored. Support and availability provided to pt. Encouraged pt to voice concerns. 1:1 observation d/c at this time, will initiate Q 15 minutes safety checks.  R: Pt receptive to care. Pt compliant with medications, denies adverse drug reactions. POC continues for safety and mood stability.

## 2017-05-15 NOTE — Progress Notes (Signed)
D: Pt d/c home as per MD's order. Pt was picked up in facility lobby by his grandmother.  A: D/C instructions reviewed with pt and his grandmother per pt's verbal consent; instructions included prescriptions and follow up appointment. No belongings found in locker for pt. Encouraged pt and grandmother to comply with d/c instructions. Q 15 minutes safety checks maintained till time of d/c without self harm gestures at this time. R: Pt and grandmother verbalized understanding related to d/c instruction. Pt presents with animated with bright affect. Ambulates with a steady gait, ppears to be in no physical distress at time of d/c.

## 2017-05-15 NOTE — Progress Notes (Signed)
Close obs  Pt has been resting in bed with eyes closed   Breathing is normal and unlabored   No distress noted   Pt is on close obs due to being unable to contract for safety    Safety maintained

## 2017-05-15 NOTE — Progress Notes (Signed)
Recreation Therapy Notes  INPATIENT RECREATION TR PLAN  Patient Details Name: TYRONE PAUTSCH MRN: [id number] DOB: May 13, 1996 Today's Date: 05/15/2017  Rec Therapy Plan Is patient appropriate for Therapeutic Recreation?: Yes Treatment times per week: about 3 days Estimated Length of Stay: 5-7 days TR Treatment/Interventions: Group participation (Comment)  Discharge Criteria Pt will be discharged from therapy if:: Discharged Treatment plan/goals/alternatives discussed and agreed upon by:: Patient/family  Discharge Summary Short term goals set: Patient will be able to identify at least 5 coping skills for depression by conclusion of recreation therapy tx  Short term goals met: Adequate for discharge Progress toward goals comments: Groups attended Which groups?: Coping skills Reason goals not met: None Therapeutic equipment acquired: N/A Reason patient discharged from therapy: Discharge from hospital Pt/family agrees with progress & goals achieved: Yes Date patient discharged from therapy: 05/15/17   [name], LRT/CTRS  Ria Comment, Alayzha An A 05/15/2017, 12:42 PM

## 2017-05-30 ENCOUNTER — Ambulatory Visit (HOSPITAL_COMMUNITY)
Admission: RE | Admit: 2017-05-30 | Discharge: 2017-05-30 | Disposition: A | Discharge status: Home / Self Care | Payer: Medicaid Other | Attending: Psychiatry | Admitting: Psychiatry

## 2017-05-30 ENCOUNTER — Emergency Department (HOSPITAL_COMMUNITY)
Admission: EM | Admit: 2017-05-30 | Discharge: 2017-05-31 | Disposition: A | Discharge status: Other Acute Inpatient Hospital | Payer: Medicaid Other | Attending: Emergency Medicine | Admitting: Emergency Medicine

## 2017-05-30 DIAGNOSIS — F17210 Nicotine dependence, cigarettes, uncomplicated: Secondary | ICD-10-CM | POA: Insufficient documentation

## 2017-05-30 DIAGNOSIS — R440 Auditory hallucinations: Secondary | ICD-10-CM | POA: Insufficient documentation

## 2017-05-30 DIAGNOSIS — F333 Major depressive disorder, recurrent, severe with psychotic symptoms: Secondary | ICD-10-CM | POA: Diagnosis not present

## 2017-05-30 DIAGNOSIS — F17200 Nicotine dependence, unspecified, uncomplicated: Secondary | ICD-10-CM | POA: Insufficient documentation

## 2017-05-30 DIAGNOSIS — R45850 Homicidal ideations: Secondary | ICD-10-CM | POA: Diagnosis not present

## 2017-05-30 DIAGNOSIS — F29 Unspecified psychosis not due to a substance or known physiological condition: Secondary | ICD-10-CM | POA: Insufficient documentation

## 2017-05-30 DIAGNOSIS — R45851 Suicidal ideations: Secondary | ICD-10-CM | POA: Diagnosis not present

## 2017-05-30 DIAGNOSIS — Z046 Encounter for general psychiatric examination, requested by authority: Secondary | ICD-10-CM | POA: Diagnosis not present

## 2017-05-30 DIAGNOSIS — Z79899 Other long term (current) drug therapy: Secondary | ICD-10-CM | POA: Insufficient documentation

## 2017-05-30 DIAGNOSIS — F329 Major depressive disorder, single episode, unspecified: Secondary | ICD-10-CM | POA: Diagnosis not present

## 2017-05-30 DIAGNOSIS — Z8249 Family history of ischemic heart disease and other diseases of the circulatory system: Secondary | ICD-10-CM | POA: Insufficient documentation

## 2017-05-30 DIAGNOSIS — F209 Schizophrenia, unspecified: Secondary | ICD-10-CM | POA: Diagnosis not present

## 2017-05-30 DIAGNOSIS — F1210 Cannabis abuse, uncomplicated: Secondary | ICD-10-CM | POA: Insufficient documentation

## 2017-05-30 DIAGNOSIS — R441 Visual hallucinations: Secondary | ICD-10-CM | POA: Diagnosis not present

## 2017-05-30 DIAGNOSIS — F419 Anxiety disorder, unspecified: Secondary | ICD-10-CM | POA: Insufficient documentation

## 2017-05-30 DIAGNOSIS — F22 Delusional disorders: Secondary | ICD-10-CM | POA: Diagnosis present

## 2017-05-30 LAB — CBC WITH DIFFERENTIAL/PLATELET
Basophils Absolute: 0 10*3/uL (ref 0.0–0.1)
Basophils Relative: 0 %
EOS PCT: 0 %
Eosinophils Absolute: 0 10*3/uL (ref 0.0–0.7)
HEMATOCRIT: 40 % (ref 39.0–52.0)
Hemoglobin: 13.8 g/dL (ref 13.0–17.0)
LYMPHS ABS: 1.5 10*3/uL (ref 0.7–4.0)
LYMPHS PCT: 33 %
MCH: 28.8 pg (ref 26.0–34.0)
MCHC: 34.5 g/dL (ref 30.0–36.0)
MCV: 83.5 fL (ref 78.0–100.0)
MONO ABS: 0.3 10*3/uL (ref 0.1–1.0)
Monocytes Relative: 5 %
Neutro Abs: 2.9 10*3/uL (ref 1.7–7.7)
Neutrophils Relative %: 62 %
Platelets: 264 10*3/uL (ref 150–400)
RBC: 4.79 MIL/uL (ref 4.22–5.81)
RDW: 12.5 % (ref 11.5–15.5)
WBC: 4.7 10*3/uL (ref 4.0–10.5)

## 2017-05-30 LAB — COMPREHENSIVE METABOLIC PANEL
ALBUMIN: 4.4 g/dL (ref 3.5–5.0)
ALT: 42 U/L (ref 17–63)
AST: 34 U/L (ref 15–41)
Alkaline Phosphatase: 78 U/L (ref 38–126)
Anion gap: 10 (ref 5–15)
BILIRUBIN TOTAL: 0.9 mg/dL (ref 0.3–1.2)
BUN: 11 mg/dL (ref 6–20)
CHLORIDE: 106 mmol/L (ref 101–111)
CO2: 24 mmol/L (ref 22–32)
CREATININE: 0.96 mg/dL (ref 0.61–1.24)
Calcium: 9.9 mg/dL (ref 8.9–10.3)
GFR calc Af Amer: >60 mL/min (ref >60)
GLUCOSE: 137 mg/dL — AB (ref 65–99)
Potassium: 3.2 mmol/L — ABNORMAL LOW (ref 3.5–5.1)
Sodium: 140 mmol/L (ref 135–145)
Total Protein: 7.6 g/dL (ref 6.5–8.1)

## 2017-05-30 LAB — SALICYLATE LEVEL

## 2017-05-30 LAB — ACETAMINOPHEN LEVEL

## 2017-05-30 LAB — RAPID URINE DRUG SCREEN, HOSP PERFORMED
AMPHETAMINES: NOT DETECTED
BARBITURATES: NOT DETECTED
BENZODIAZEPINES: NOT DETECTED
Cocaine: NOT DETECTED
Opiates: NOT DETECTED
Tetrahydrocannabinol: NOT DETECTED

## 2017-05-30 LAB — ETHANOL: Alcohol, Ethyl (B): <10 mg/dL (ref <10)

## 2017-05-30 NOTE — ED Triage Notes (Signed)
Patient eloped from this facility and behavioral health today. [facility] is SI, with a plan to jump out in front of cars, also HI without a plan to hurt anyone specific. Pt was brought via hospital transport to be IVC's and evaluated. [facility].

## 2017-05-30 NOTE — BH Assessment (Signed)
Marvin SievertSpencer Simon NP recommends inpatient. [facility] [place] TTS to look for placement.

## 2017-05-30 NOTE — H&P (Signed)
Behavioral Health Medical Screening Exam  Marvin Crawford is an 21 y.o. male accompanied with his mother, endorsing concerns with inability to contract, multiple eloping in the setting of hypo-mania characterized by insomnia, mood dysregulation and psychotic features. Patient has been off his Seroquel x one weeks duration, but taking his other chronic medications. He is denying any acute medical ailments or exacerbated co-morbid conditions.  Total Time spent with patient: 20 minutes  Psychiatric Specialty Exam: Physical Exam  Skin: Skin is warm and dry.  Psychiatric: His mood appears anxious. His speech is rapid and/or pressured. He is agitated and actively hallucinating. Thought content is paranoid. Cognition and memory are impaired. He expresses impulsivity. He exhibits a depressed mood. He is inattentive.    Review of Systems  Psychiatric/Behavioral: Positive for depression and hallucinations. Negative for substance abuse and suicidal ideas. The patient is nervous/anxious and has insomnia.     There were no vitals taken for this visit.There is no height or weight on file to calculate BMI.  General Appearance: Disheveled  Eye Contact:  Fair  Speech:  Slow  Volume:  Decreased  Mood:  Anxious and Depressed  Affect:  Flat  Thought Process:  Disorganized  Orientation:  Full (Time, Place, and Person)  Thought Content:  Hallucinations: Auditory Visual  Suicidal Thoughts:  No  Homicidal Thoughts:  No  Memory:  Immediate;   Fair  Judgement:  Poor  Insight:  Lacking  Psychomotor Activity:  Increased  Concentration: Concentration: Fair  Recall:  Poor  Fund of Knowledge:Fair  Language: Fair  Akathisia:  Negative  Handed:  Right  AIMS (if indicated):     Assets:  Desire for Improvement  Sleep:       Musculoskeletal: Strength & Muscle Tone: within normal limits Gait & Station: normal Patient leans: N/A  There were no vitals taken for this visit.  Recommendations:  Based on my  evaluation the patient does not appear to have an emergency medical condition.  Kerry HoughSpencer E Alyssandra Hulsebus, PA-C 05/30/2017, 8:54 PM

## 2017-05-30 NOTE — BH Assessment (Signed)
[facility]. Patient reported suicidal ideation, homicidal ideation, and auditory and visual hallucinations, irritability and ager, anxiety, guilt. Patient also reported 10/10 stomach pain. When writer went to assess patient and family the patient was standing in the parking lot at car with mother, entering car to leave. They did not indicate to staff they were leaving.  Writer phoned number provided on the form , spoke with mother Denton Meekngela Knox at [phone]. Mother states '' he's not really been stable and not doing well since he left there. '' Asked mother about what patient indicated on form and informed emergent conditions patient should be evaluated for. She states '' he's not willing to stay. He told me he wants something to eat so I'm going to take him somewhere to get him some food and then go home. '' Discussed that patient suicidal should be seen. Also discussed IVC process and also informed mother that [facility] would [facility] for wellness check. She confirmed address and verbalized understanding. TTS NP also aware of above. 911 called for wellness check and relayed information on walk in form.

## 2017-05-30 NOTE — BH Assessment (Signed)
Assessment Note  Marvin Crawford is an 21 y.o. male [facility] and escorted by police. [facility]. The patient reports auditory hallucinations in the form of unspecified voices. States he has visual hallucinations in the form of shadows. The patient has been paranoid according to mother. The patient thought his brother had a gun and wanted to shoot mom, within the last week. The patient expressed feeling scared and having racing thoughts. Denies SI or HI. Mother reports he threw away a TV, a phone and some keys in a state of confusion. Took mothers car in the last week without her knowledge, reporting he want to go [place]. The patient got lost while driving. Mother reports the patient takes his medication as prescribed but was unable to get his prescription for Seroquel.  Not sleeping well, only a few hours the last few days. Decreased appetite.  Today the patient jumped out of mom's car while it was moving, reports he felt scared and confused. Denied this was an SI attempt. Mother states [facility] but he then ran behind the car begging her not to leave him. Mom reports it was at this point she called police for help. Denies the patient has been violent. The patient has a history of cannabis abuse and some alcohol use. The patient denies any drug or alcohol use in over one month. Mother reports the patient had three admission [facility] the [facility]. The patient had fair eye contact, freedom of movement, soft and slow speech, depressed mood, impaired judgment and insght.   Donell SievertSpencer Simon NP recommends inpatient. [facility]. [facility] TTS to look for placement.   Diagnosis: Schizophrenia; Borderline intellectual functioning; Cannabis use disorder  Past Medical History:  Past Medical History:  Diagnosis Date  . ADD (attention  deficit disorder)   . Anxiety   . Depression   . Environmental allergies     No past surgical history on file.  Family History:  Family History  Problem Relation Age of Onset  . Hypertension Mother   . Hypertension Father   . Mental illness Neg Hx     Social History:  reports that he has been smoking.  He has been smoking about 2.00 packs per day. He has never used smokeless tobacco. He reports that he uses drugs, including Other-see comments and Marijuana. He reports that he does not drink alcohol.  Additional Social History:  Alcohol / Drug Use Pain Medications: see MAR Prescriptions: see MAR Over the Counter: see MAR History of alcohol / drug use?: Yes Substance #1 Name of Substance 1: cannabis 1 - Age of First Use: UTA 1 - Amount (size/oz): UTA 1 - Frequency: UTA 1 - Duration: UTA 1 - Last Use / Amount: a month ago Substance #2 Name of Substance 2: alcohol 2 - Age of First Use: UTA 2 - Amount (size/oz): UTA 2 - Frequency: UTA 2 - Duration: UTA 2 - Last Use / Amount: a month ago  CIWA:   COWS:    Allergies: No Known Allergies  Home Medications:  (Not in a hospital admission)  OB/GYN Status:  No LMP for male patient.  General Assessment Data [facility] TTS Assessment: In system Is this a Tele or Face-to-Face Assessment?: Face-to-Face Is this an Initial Assessment or a  Re-assessment for this encounter?: Initial Assessment Marital status: Single Is patient pregnant?: No Pregnancy Status: No Living Arrangements: Parent Can pt return to current living arrangement?: Yes Admission Status: Voluntary Is patient capable of signing voluntary admission?: Yes Referral Source: Self/Family/Friend Insurance type: none  Medical Screening Exam [facility] Walk-in ONLY) Medical Exam completed: No  Crisis Care Plan Living Arrangements: Parent Legal Guardian: Other: Name of Psychiatrist: [facility] Name of Therapist: [facility]  Education Status Is patient currently in school?: No Current Grade: n/a Highest grade of school patient has completed: 12th Name of school: n/a Contact person: n/a  Risk to self with the past 6 months Suicidal Ideation: No Has patient been a risk to self within the past 6 months prior to admission? : Yes Suicidal Intent: No Has patient had any suicidal intent within the past 6 months prior to admission? : Yes Is patient at risk for suicide?: No Suicidal Plan?: No Has patient had any suicidal plan within the past 6 months prior to admission? : Yes Specify Current Suicidal Plan: n/a Access to Means: No What has been your use of drugs/alcohol within the last 12 months?: cannabis, alcohol Previous Attempts/Gestures: Yes How many times?: 3 Other Self Harm Risks: none Triggers for Past Attempts: Other (Comment) (depression) Intentional Self Injurious Behavior: None Family Suicide History: No Recent stressful life event(s): Other (Comment) Persecutory voices/beliefs?: No Depression: No Substance abuse history and/or treatment for substance abuse?: Yes Suicide prevention information given to non-admitted patients: Not applicable  Risk to Others within the past 6 months Homicidal Ideation: No Does patient have any lifetime risk of violence toward others beyond the six months prior to admission? : No Thoughts of Harm to Others: No Current Homicidal Intent: No Current Homicidal Plan: No Access to Homicidal Means: No Identified Victim: n/a History of harm to others?: No Assessment of Violence: On admission Violent Behavior Description: n/a Does patient have access to weapons?: No Criminal Charges Pending?: No Does patient have a court date: No Is patient on probation?: No  Psychosis Hallucinations: Auditory, Visual Delusions: Unspecified  Mental Status Report Appearance/Hygiene: Unremarkable Eye Contact: Fair Motor Activity: Freedom of movement Speech: Soft,  Slow Level of Consciousness: Quiet/awake Mood: Depressed Affect: Appropriate to circumstance Anxiety Level: Minimal Thought Processes: Coherent, Relevant, Circumstantial Judgement: Impaired Orientation: Person, Place, Time, Situation Obsessive Compulsive Thoughts/Behaviors: None  Cognitive Functioning Concentration: Poor Memory: Recent Intact, Remote Intact IQ: Average Insight: Poor Impulse Control: Poor Appetite: Poor Weight Gain: 0 Sleep: Decreased Total Hours of Sleep:  (a few hrs in the last 2 days) Vegetative Symptoms: None  ADLScreening [facility] Assessment Services) Patient's cognitive ability adequate to safely complete daily activities?: Yes Patient able to express need for assistance with ADLs?: Yes Independently performs ADLs?: Yes (appropriate for developmental age)  Prior Inpatient Therapy Prior Inpatient Therapy: Yes Prior Therapy Dates: 2014, 2016, 2017, 2018 Prior Therapy Facilty/Provider(s): [facility] Reason for Treatment: Schizophrenia, SI  Prior Outpatient Therapy Prior Outpatient Therapy: Yes Prior Therapy Dates: ongoing  Prior Therapy Facilty/Provider(s): [facility] Reason for Treatment: Schizophrenia Does patient have an ACCT team?: No Does patient have Intensive In-House Services?  : No Does patient have [facility] services? : No Does patient have P4CC services?: No  ADL Screening (condition at time of admission) Patient's cognitive ability adequate to safely complete daily activities?: Yes Is the patient deaf or have difficulty hearing?: No Does the patient have difficulty seeing, even when wearing glasses/contacts?: No Does the patient have difficulty concentrating, remembering, or making decisions?: No  Patient able to express need for assistance with ADLs?: Yes Does the patient have difficulty dressing or bathing?: No Independently performs ADLs?: Yes (appropriate for developmental age)       Abuse/Neglect Assessment (Assessment to be  complete while patient is alone) Physical Abuse: Denies Verbal Abuse: Denies Sexual Abuse: Denies     Advance Directives (For Healthcare) Does Patient Have a Medical Advance Directive?: No    Additional Information 1:1 In Past 12 Months?: No CIRT Risk: No Elopement Risk: No Does patient have medical clearance?: No     Disposition:  Disposition Initial Assessment Completed for this Encounter: Yes Disposition of Patient: Inpatient treatment program Type of inpatient treatment program: Adult  On Site Evaluation by:   Reviewed with Physician:  Donell Sievert NP  Vonzell Schlatter Gaylen Pereira 05/30/2017 9:00 PM

## 2017-05-30 NOTE — ED Provider Notes (Signed)
[facility]-EMERGENCY DEPT Provider Note   CSN: [id number] Arrival date & time: 05/30/17  2044     History   Chief Complaint Chief Complaint  Patient presents with  . Suicidal  . Medical Clearance    HPI Marvin Crawford is a 21 y.o. male.  Level 5 caveat for psychosis.  Most of history obtained from nursing notes.  Patient is allegedly suicidal and homicidal.  He has had auditory and visual hallucinations and is paranoid.  [facility] but escaped and he was eventually captured and sent to behavioral health.  He attempted to run out in front of traffic.  [facility].  Past medical history includes ADD, anxiety, depression, borderline intellectual functioning, cannabis use disorder.      Past Medical History:  Diagnosis Date  . ADD (attention deficit disorder)   . Anxiety   . Depression   . Environmental allergies     Patient Active Problem List   Diagnosis Date Noted  . 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 Stuttering 08/09/2015  . Cannabis use disorder, moderate, dependence (HCC) 08/09/2015  . MDD (major depressive disorder), recurrent, severe, with psychosis (HCC) 02/16/2013    No past surgical history on file.     Home Medications    Prior to Admission medications   Medication Sig Start Date End Date Taking? Authorizing Provider  escitalopram (LEXAPRO) 20 MG tablet Take 1 tablet (20 mg total) by mouth daily. 05/16/17   Oneta RackLewis, Tanika N, NP  famotidine (PEPCID) 20 MG tablet Take 1 tablet (20 mg total) by mouth 2 (two) times daily. 09/16/15   Adonis BrookAgustin, Sheila, NP  hydrOXYzine (ATARAX/VISTARIL) 25 MG tablet Take 1 tablet (25 mg total) by mouth every 6 (six) hours as needed for anxiety. 05/15/17   Oneta RackLewis, Tanika N, NP  QUEtiapine (SEROQUEL) 25 MG tablet Take 1 tablet (25 mg total) by mouth 2 (two) times daily. Take 3 tablet (75mg  total) by mouth at bed time 05/15/17    Oneta RackLewis, Tanika N, NP  traZODone (DESYREL) 50 MG tablet Take 1 tablet (50 mg total) by mouth at bedtime as needed for sleep. 05/15/17   Oneta RackLewis, Tanika N, NP    Family History Family History  Problem Relation Age of Onset  . Hypertension Mother   . Hypertension Father   . Mental illness Neg Hx     Social History Social History  Substance Use Topics  . Smoking status: Current Every Day Smoker    Packs/day: 2.00  . Smokeless tobacco: Never Used  . Alcohol use No     Allergies   Patient has no known allergies.   Review of Systems Review of Systems  Unable to perform ROS: Psychiatric disorder     Physical Exam Updated Vital Signs BP (!) 147/96 (BP Location: Left Arm)   Pulse 94   Temp 98.6 F (37 C) (Oral)   Resp 16   SpO2 100%   Physical Exam  Constitutional: He is oriented to person, place, and time. He appears well-developed and well-nourished.  HENT:  Head: Normocephalic and atraumatic.  Eyes: Conjunctivae are normal.  Neck: Neck supple.  Cardiovascular: Normal rate and regular rhythm.   Pulmonary/Chest: Effort normal and breath sounds normal.  Abdominal: Soft. Bowel sounds are normal.  Musculoskeletal: Normal range of motion.  Neurological: He is alert and oriented to person, place, and time.  Skin: Skin is warm and dry.  Psychiatric:  Flat affect,  depressed  Nursing note and vitals reviewed.    ED Treatments / Results  Labs (all labs ordered are listed, but only abnormal results are displayed) Labs Reviewed  CBC WITH DIFFERENTIAL/PLATELET  RAPID URINE DRUG SCREEN, HOSP PERFORMED  COMPREHENSIVE METABOLIC PANEL  ETHANOL  SALICYLATE LEVEL  ACETAMINOPHEN LEVEL    EKG  EKG Interpretation None       Radiology No results found.  Procedures Procedures (including critical care time)  Medications Ordered in ED Medications - No data to display   Initial Impression / Assessment and Plan / ED Course  I have reviewed the triage vital signs and  the nursing notes.  Pertinent labs & imaging results that were available during my care of the patient were reviewed by me and considered in my medical decision making (see chart for details).     Patient has had auditory and visual hallucinations along with homicidal and suicidal ideation.  He was involuntarily committed.  Consult to behavioral health.  Final Clinical Impressions(s) / ED Diagnoses   Final diagnoses:  Suicidal ideation  Homicidal ideation  Psychosis, unspecified psychosis type (HCC)    New Prescriptions New Prescriptions   No medications on file     Donnetta Hutching, MD 05/30/17 2213

## 2017-05-30 NOTE — ED Notes (Signed)
[facility] Expected date:  Expected time:  Means of arrival:  [facility]

## 2017-05-30 NOTE — ED Notes (Signed)
[facility] Expected date:  Expected time:  Means of arrival:  Comments: Hold on for HALL C

## 2017-05-30 NOTE — ED Notes (Signed)
Patient arrived to unit via ambulatory with a steady gait. Patient oriented to unit and given a snack. Patient reports that he does not want to talk at this time. Encouragement and support provided and safety maintain. Q 15 min safety checks in place and video monitoring.

## 2017-05-30 NOTE — BHH Counselor (Signed)
Per Medical Records, [facility].  Assessment completed by BorgWarnershely Medford (Counselor) prior to [facility]   [facility]. Per Karleen HampshireSpencer Simon-PA-C: Inpatient treatment recommended.  Per medical records, [facility] at this [facility].  TTS to seek placement.  Drema HalonWL-EDP, Cook, MD notified at 2311.

## 2017-05-31 DIAGNOSIS — F1910 Other psychoactive substance abuse, uncomplicated: Secondary | ICD-10-CM | POA: Diagnosis not present

## 2017-05-31 DIAGNOSIS — F333 Major depressive disorder, recurrent, severe with psychotic symptoms: Secondary | ICD-10-CM | POA: Diagnosis not present

## 2017-05-31 DIAGNOSIS — F1210 Cannabis abuse, uncomplicated: Secondary | ICD-10-CM

## 2017-05-31 DIAGNOSIS — R440 Auditory hallucinations: Secondary | ICD-10-CM

## 2017-05-31 DIAGNOSIS — F419 Anxiety disorder, unspecified: Secondary | ICD-10-CM

## 2017-05-31 DIAGNOSIS — R450 Nervousness: Secondary | ICD-10-CM

## 2017-05-31 DIAGNOSIS — R45851 Suicidal ideations: Secondary | ICD-10-CM | POA: Diagnosis not present

## 2017-05-31 DIAGNOSIS — F17210 Nicotine dependence, cigarettes, uncomplicated: Secondary | ICD-10-CM | POA: Diagnosis not present

## 2017-05-31 MED ORDER — QUETIAPINE FUMARATE 50 MG PO TABS
75.0000 mg | ORAL_TABLET | Freq: Every day | ORAL | Status: DC
  Administered 2017-05-31: 75 mg via ORAL
  Filled 2017-05-31: qty 1

## 2017-05-31 MED ORDER — QUETIAPINE FUMARATE 100 MG PO TABS: 100.0000 mg | ORAL_TABLET | Freq: Every day | ORAL | Status: DC

## 2017-05-31 MED ORDER — ESCITALOPRAM OXALATE 10 MG PO TABS
10.0000 mg | ORAL_TABLET | Freq: Every day | ORAL | Status: DC
  Administered 2017-05-31: 10 mg via ORAL
  Filled 2017-05-31: qty 1

## 2017-05-31 NOTE — Consult Note (Addendum)
[facility] Face-to-Face Psychiatry Consult   Reason for Consult: Psychosis  Referring Physician: EDP Patient Identification: Marvin Crawford MRN:  [id number] Principal Diagnosis: MDD (major depressive disorder), recurrent, severe, with psychosis ([place]) Diagnosis:   Patient Active Problem List   Diagnosis Date Noted  . Unspecified psychosis not due to a substance or known physiological condition ([place]) [F29] 05/31/2017  . 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 Stuttering [F80.81] 08/09/2015  . Cannabis use disorder, moderate, dependence ([place]) [F12.20] 08/09/2015  . MDD (major depressive disorder), recurrent, severe, with psychosis ([place]) [F33.3] 02/16/2013    Total Time spent with patient: 20 minutes  Subjective:   Marvin Crawford is a 21 y.o. male patient admitted with auditory hallucinations and disorganized behavior.   HPI:   Marvin Crawford reports that he needs an x-ray of his head because there is something wrong with him. He reports falling down stairs when he was 21 y/o and he went "brain dead." He reports onset of voices since this time. He also reports that his brother told him that he would harm their mother but he is unable to provide a reason why he would harm their mother. He endorses thoughts to harm self and a prior history of suicide attempt. He reports that he has been sad and reports recent family discord.    Past Psychiatric History: Multiple past psychiatric hospitalizations to [facility] and last admission was 05/2017). 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   Risk to Self: Is patient at risk for suicide?: Yes Risk to Others: Denies  Prior Inpatient Therapy: Multiple past psychiatric hospitalizations to [facility] and last admission was 05/2017).  Prior Outpatient Therapy: Unknown  Past Medical History:  Past Medical History:  Diagnosis Date  . ADD (attention deficit  disorder)   . Anxiety   . Depression   . Environmental allergies    No past surgical history on file. Family History:  Family History  Problem Relation Age of Onset  . Hypertension Mother   . Hypertension Father   . Mental illness Neg Hx    Family Psychiatric  History: None known.  Social History:  History  Alcohol Use No     History  Drug Use  . Types: Other-see comments, Marijuana    Comment: oil smoked in pipe calls "[employer]", friends buy for him    Social History   Social History  . Marital status: Single    Spouse name: N/A  . Number of children: N/A  . Years of education: N/A   Social History Main Topics  . Smoking status: Current Every Day Smoker    Packs/day: 2.00  . Smokeless tobacco: Never Used  . Alcohol use No  . Drug use: Yes    Types: Other-see comments, Marijuana     Comment: oil smoked in pipe calls "DAB", friends buy for him  . Sexual activity: Yes    Birth control/ protection: Condom   Other Topics Concern  . Not on file   Social History Narrative  . No narrative on file   Additional Social History: He uses marijuana but quantity unknown.     Allergies:  No Known Allergies  Labs:  Results for orders placed or performed during the hospital encounter of 05/30/17 (from the past 48 hour(s))  Urine rapid drug screen (hosp performed)     Status: None   Collection Time: 05/30/17  9:20 PM  Result Value Ref Range   Opiates  NONE DETECTED NONE DETECTED   Cocaine NONE DETECTED NONE DETECTED   Benzodiazepines NONE DETECTED NONE DETECTED   Amphetamines NONE DETECTED NONE DETECTED   Tetrahydrocannabinol NONE DETECTED NONE DETECTED   Barbiturates NONE DETECTED NONE DETECTED    Comment:        DRUG SCREEN FOR MEDICAL PURPOSES ONLY.  IF CONFIRMATION IS NEEDED FOR ANY PURPOSE, NOTIFY LAB WITHIN 5 DAYS.        LOWEST DETECTABLE LIMITS FOR URINE DRUG SCREEN Drug Class       Cutoff (ng/mL) Amphetamine      1000 Barbiturate      200 Benzodiazepine    327 Tricyclics       614 Opiates          300 Cocaine          300 THC              50   CBC with Differential     Status: None   Collection Time: 05/30/17  9:27 PM  Result Value Ref Range   WBC 4.7 4.0 - 10.5 K/uL   RBC 4.79 4.22 - 5.81 MIL/uL   Hemoglobin 13.8 13.0 - 17.0 g/dL   HCT 40.0 39.0 - 52.0 %   MCV 83.5 78.0 - 100.0 fL   MCH 28.8 26.0 - 34.0 pg   MCHC 34.5 30.0 - 36.0 g/dL   RDW 12.5 11.5 - 15.5 %   Platelets 264 150 - 400 K/uL   Neutrophils Relative % 62 %   Neutro Abs 2.9 1.7 - 7.7 K/uL   Lymphocytes Relative 33 %   Lymphs Abs 1.5 0.7 - 4.0 K/uL   Monocytes Relative 5 %   Monocytes Absolute 0.3 0.1 - 1.0 K/uL   Eosinophils Relative 0 %   Eosinophils Absolute 0.0 0.0 - 0.7 K/uL   Basophils Relative 0 %   Basophils Absolute 0.0 0.0 - 0.1 K/uL  Comprehensive metabolic panel     Status: Abnormal   Collection Time: 05/30/17  9:27 PM  Result Value Ref Range   Sodium 140 135 - 145 mmol/L   Potassium 3.2 (L) 3.5 - 5.1 mmol/L   Chloride 106 101 - 111 mmol/L   CO2 24 22 - 32 mmol/L   Glucose, Bld 137 (H) 65 - 99 mg/dL   BUN 11 6 - 20 mg/dL   Creatinine, Ser 0.96 0.61 - 1.24 mg/dL   Calcium 9.9 8.9 - 10.3 mg/dL   Total Protein 7.6 6.5 - 8.1 g/dL   Albumin 4.4 3.5 - 5.0 g/dL   AST 34 15 - 41 U/L   ALT 42 17 - 63 U/L   Alkaline Phosphatase 78 38 - 126 U/L   Total Bilirubin 0.9 0.3 - 1.2 mg/dL   GFR calc non Af Amer >60 >60 mL/min   GFR calc Af Amer >60 >60 mL/min    Comment: (NOTE) The eGFR has been calculated using the CKD EPI equation. This calculation has not been validated in all clinical situations. eGFR's persistently <60 mL/min signify possible Chronic Kidney Disease.    Anion gap 10 5 - 15  Ethanol     Status: None   Collection Time: 05/30/17  9:27 PM  Result Value Ref Range   Alcohol, Ethyl (B) <10 <10 mg/dL    Comment:        LOWEST DETECTABLE LIMIT FOR SERUM ALCOHOL IS 10 mg/dL FOR MEDICAL PURPOSES ONLY   Salicylate level     Status: None    Collection  Time: 05/30/17  9:27 PM  Result Value Ref Range   Salicylate Lvl <6.8 2.8 - 30.0 mg/dL  Acetaminophen level     Status: Abnormal   Collection Time: 05/30/17  9:27 PM  Result Value Ref Range   Acetaminophen (Tylenol), Serum <10 (L) 10 - 30 ug/mL    Comment:        THERAPEUTIC CONCENTRATIONS VARY SIGNIFICANTLY. A RANGE OF 10-30 ug/mL MAY BE AN EFFECTIVE CONCENTRATION FOR MANY PATIENTS. HOWEVER, SOME ARE BEST TREATED AT CONCENTRATIONS OUTSIDE THIS RANGE. ACETAMINOPHEN CONCENTRATIONS >150 ug/mL AT 4 HOURS AFTER INGESTION AND >50 ug/mL AT 12 HOURS AFTER INGESTION ARE OFTEN ASSOCIATED WITH TOXIC REACTIONS.     No current facility-administered medications for this encounter.    Current Outpatient Prescriptions  Medication Sig Dispense Refill  . escitalopram (LEXAPRO) 20 MG tablet Take 1 tablet (20 mg total) by mouth daily. 30 tablet 0  . famotidine (PEPCID) 20 MG tablet Take 1 tablet (20 mg total) by mouth 2 (two) times daily. 60 tablet 0  . hydrOXYzine (ATARAX/VISTARIL) 25 MG tablet Take 1 tablet (25 mg total) by mouth every 6 (six) hours as needed for anxiety. 30 tablet 0  . ibuprofen (ADVIL,MOTRIN) 200 MG tablet Take 200 mg by mouth every 8 (eight) hours as needed (pain).    . QUEtiapine (SEROQUEL) 25 MG tablet Take 1 tablet (25 mg total) by mouth 2 (two) times daily. Take 3 tablet (27m total) by mouth at bed time (Patient taking differently: Take 25-75 mg by mouth 3 (three) times daily. Take 256mby mouth twice daily and take 3 tablets (7573motal) by mouth at bedtime) 150 tablet 0  . traZODone (DESYREL) 50 MG tablet Take 1 tablet (50 mg total) by mouth at bedtime as needed for sleep. 30 tablet 0    Musculoskeletal: Strength & Muscle Tone: within normal limits Gait & Station: normal Patient leans: N/A  Psychiatric Specialty Exam: Physical Exam  Nursing note and vitals reviewed. Constitutional: He is oriented to person, place, and time. He appears well-developed  and well-nourished.  HENT:  Head: Normocephalic and atraumatic.  Neck: Normal range of motion.  Respiratory: Effort normal.  Musculoskeletal: Normal range of motion.  Neurological: He is alert and oriented to person, place, and time.  Skin: No rash noted.    Review of Systems  Psychiatric/Behavioral: Positive for depression, hallucinations, substance abuse and suicidal ideas. The patient is nervous/anxious. The patient does not have insomnia.     Blood pressure 130/85, pulse 74, temperature 98.3 F (36.8 C), temperature source Oral, resp. rate 16, SpO2 100 %.There is no height or weight on file to calculate BMI.  General Appearance: WNWD, young, AA male with short afro hair in hospital scrubs.   Eye Contact:  Good  Speech:  Slow  Volume:  Normal  Mood:  Depressed  Affect:  Blunt  Thought Process:  Disorganized  Orientation:  Full (Time, Place, and Person)  Thought Content:  Delusions and Hallucinations: Auditory  Suicidal Thoughts:  Yes.  without intent/plan  Homicidal Thoughts:  No  Memory:  Immediate;   Fair Recent;   Fair Remote;   Fair  Judgement:  Impaired  Insight:  Lacking  Psychomotor Activity:  Decreased  Concentration:  Concentration: Fair and Attention Span: Poor  Recall:  Poor  Fund of Knowledge:  Poor  Language:  Fair  Akathisia:  No  Handed:  Right  AIMS (if indicated):     Assets:  Desire for Improvement Housing Social Support  ADL's:  Intact  Cognition:  WNL  Sleep:   Okay    Assessment: Mr. Eddleman presents with AH and delusions with depressed mood and disorganized behavior and thought process. He warrants inpatient psychiatric hospitalization for stabilization and treatment.   Treatment Plan Summary: MDD, recurrent, severe, with psychosis: Daily contact with patient to assess and evaluate symptoms and progress in treatment and Medication management  -Restart home medications:   -Lexapro 10 mg daily for depressed mood. Unclear when patient last  received medication so will restart at half the home dose with plan to titrate.   -Seroquel 75 mg qhs with plan to titrate for symptom management.  -Order EKG to monitor QTc.   Disposition: Recommend psychiatric Inpatient admission when medically cleared.  Faythe Dingwall, DO 05/31/2017 1:19 PM

## 2017-05-31 NOTE — BH Assessment (Addendum)
[facility] Assessment Progress Note  Marvin Crawford from [facility] called to accept pt to their facility. Accepting provider is Marvin Crawford. Call report to [phone]. Pt's RN, Marvin Crawford, notified.   Marvin ShockSamantha M. Ladona Ridgelaylor, MS, NCC, LPCA Counselor

## 2017-05-31 NOTE — BH Assessment (Addendum)
[facility]  Per Juanetta BeetsJacqueline Norman, DO, this pt requires psychiatric hospitalization at this time.  Pt presents under IVC initiated by EDP Donnetta HutchingBrian Cook, MD.  Christiane HaJonathan calls from [facility] to report that pt has been accepted to their facility by Dr Wendall StadeKohl.  Dr Sharma CovertNorman concurs with this decision.  Pt's nurse, Carlisle BeersLuann, has been notified, and agrees to call report to [phone].  [facility]  Doylene Canninghomas Koni Kannan, MA Triage Specialist [phone]

## 2017-05-31 NOTE — Progress Notes (Signed)
05/31/17 1400:  LRT went to pt room to offer activities.  Pt was lying down but awake.  Pt expressed he would be interested in doing activities.  Pt met LRT in dayroom and played [employer].  Pt was quiet but would talk when prompted.  Pt was pleasant and engaged.   [name], LRT/CTRS

## 2017-07-02 ENCOUNTER — Ambulatory Visit (HOSPITAL_COMMUNITY)
Admission: RE | Admit: 2017-07-02 | Discharge: 2017-07-02 | Disposition: A | Discharge status: Home / Self Care | Payer: Medicaid Other | Attending: Psychiatry | Admitting: Psychiatry

## 2017-07-02 DIAGNOSIS — R440 Auditory hallucinations: Secondary | ICD-10-CM | POA: Insufficient documentation

## 2017-07-02 DIAGNOSIS — R441 Visual hallucinations: Secondary | ICD-10-CM | POA: Insufficient documentation

## 2017-07-02 NOTE — BH Assessment (Signed)
Assessment Note  Marvin Crawford is an 21 y.o. male.  -[facility] with his mother.  Patient's therapist on his CST team recommended he come in for evaluation.  Patient was seen by therapist with [facility] today.  When he was being seen he told her that he needed to throw away "everything in the house that was black."  Pt was trying to throw away various items.  Patient does admit this.  Patient did not see anything wrong with this.  Mother said that patient had told her two days ago that he saw people coming into the house.  He told her he was scared to go inside so he waited until mother came home to go inside.  Patient does admit to thinking there were people in the house but says "I don't think that now."  Patient denies hearing voices or seeing things currently.  Patient also is denying any current SI.  No plan or intention to kill self.  Pt denies any HI.  Is also denying any A/V hallucinations currently.  Patient denies any recent use of marijuana or ETOH.  Does have prior hx of marijuana use.  Patient's mother is concerned because patient is showing patterns of behavior that preceeded other hospitalizations.  Patient says he is feeling fine and is taking his medications as directed.  Mother said that he has poor concentration and confusion.  Clinician let mother and client know that it may be a few minutes until a provider could do the MSE.  Mother said that she would go get patient something from [employer].  Patient agreed to wait until she got back.  Pt went to lobby to wait because he had asked someone if he could do so.  Pt waited for a few minutes.  Receptionist said he went ahead and left.  -Clinician called the emergency number for patient and did speak to mother.  She said she had gotten him and returned home.  She was invited to bring him back but she said she may just wait.  Clinician asked if she had any concerns about safety.  She said, no  that he had never harmed anyone else.  Clinician also gave mother the number for Mobile Crisis Management.    Diagnosis: F33.3 MDD recurrent w/ psychotic features; F25.8 Schizoaffective d/o  Past Medical History:  Past Medical History:  Diagnosis Date  . ADD (attention deficit disorder)   . Anxiety   . Depression   . Environmental allergies     No past surgical history on file.  Family History:  Family History  Problem Relation Age of Onset  . Hypertension Mother   . Hypertension Father   . Mental illness Neg Hx     Social History:  reports that he has been smoking.  He has been smoking about 2.00 packs per day. he has never used smokeless tobacco. He reports that he uses drugs. Drugs: Other-see comments and Marijuana. He reports that he does not drink alcohol.  Additional Social History:  Alcohol / Drug Use Pain Medications: None Prescriptions: Seroquel, Depakote, Vistaril, Abilify & Prozac Over the Counter: Vitamin D History of alcohol / drug use?: No history of alcohol / drug abuse(No recent use.)  CIWA:   COWS:    Allergies: No Known Allergies  Home Medications:  (Not in a hospital admission)  OB/GYN Status:  No LMP for male patient.  General Assessment Data [facility]  Assessment: In system Is this a Tele or Face-to-Face Assessment?: Face-to-Face Is this an Initial Assessment or a Re-assessment for this encounter?: Initial Assessment Marital status: Single Is patient pregnant?: No Pregnancy Status: No Living Arrangements: Parent(Brother and a cousin also live in the home.) Can pt return to current living arrangement?: Yes Admission Status: Voluntary Is patient capable of signing voluntary admission?: Yes Referral Source: Self/Family/Friend(Therapist from [facility] had recommended evaluation.) Insurance type: MCD     Crisis Care Plan Living Arrangements: Parent(Brother and a cousin also live in the home.) Name  of Psychiatrist: [facility] Name of Therapist: [facility]  Education Status Is patient currently in school?: No Highest grade of school patient has completed: 12th grade  Risk to self with the past 6 months Suicidal Ideation: No Has patient been a risk to self within the past 6 months prior to admission? : Yes Suicidal Intent: No Has patient had any suicidal intent within the past 6 months prior to admission? : Yes Is patient at risk for suicide?: No Suicidal Plan?: No Has patient had any suicidal plan within the past 6 months prior to admission? : Yes Specify Current Suicidal Plan: None Access to Means: Yes Specify Access to Suicidal Means: N/A What has been your use of drugs/alcohol within the last 12 months?: Pt reports none. Previous Attempts/Gestures: Yes How many times?: 3 Other Self Harm Risks: None Triggers for Past Attempts: Other (Comment)(Depression) Intentional Self Injurious Behavior: None Family Suicide History: No Recent stressful life event(s): Other (Comment)(Questionable about hearing things) Persecutory voices/beliefs?: No Depression: No Depression Symptoms: (Pt denies symptoms.) Substance abuse history and/or treatment for substance abuse?: Yes Suicide prevention information given to non-admitted patients: Not applicable  Risk to Others within the past 6 months Homicidal Ideation: No Does patient have any lifetime risk of violence toward others beyond the six months prior to admission? : No Thoughts of Harm to Others: No Current Homicidal Intent: No Current Homicidal Plan: No Access to Homicidal Means: No Identified Victim: No one History of harm to others?: No Assessment of Violence: None Noted Violent Behavior Description: None reported Does patient have access to weapons?: No Criminal Charges Pending?: No Does patient have a court date: No Is patient on probation?: No  Psychosis Hallucinations: Visual(Pt told mother he saw people entering  house;  He denies curr) Delusions: None noted  Mental Status Report Appearance/Hygiene: Unremarkable Eye Contact: Good Motor Activity: Freedom of movement, Unremarkable Speech: Soft, Slow Level of Consciousness: Alert Mood: Anxious Affect: Appropriate to circumstance Anxiety Level: Moderate Thought Processes: Coherent, Relevant Judgement: Impaired Orientation: Appropriate for developmental age Obsessive Compulsive Thoughts/Behaviors: None  Cognitive Functioning Concentration: Decreased Memory: Recent Impaired, Remote Intact IQ: Average Insight: Poor Impulse Control: Poor Appetite: Poor Weight Loss: 0 Weight Gain: 0 Sleep: Decreased Total Hours of Sleep: (Mother reports he is up and down.) Vegetative Symptoms: None  ADLScreening [facility] Assessment Services) Patient's cognitive ability adequate to safely complete daily activities?: Yes Patient able to express need for assistance with ADLs?: Yes Independently performs ADLs?: Yes (appropriate for developmental age)  Prior Inpatient Therapy Prior Inpatient Therapy: Yes Prior Therapy Dates: 2014, 2016, 2017, 2018 Prior Therapy Facilty/Provider(s[facility] Reason for Treatment: Schizophrenia, SI  Prior Outpatient Therapy Prior Outpatient Therapy: Yes Prior Therapy Dates: ongoing  Prior Therapy Facilty/Provider(s): [facility] Reason for Treatment: Schizophrenia Does patient have an ACCT team?: No Does patient have Intensive In-House Services?  : No Does patient have [facility] services? : No Does patient have P4CC services?:  No  ADL Screening (condition at time of admission) Patient's cognitive ability adequate to safely complete daily activities?: Yes Is the patient deaf or have difficulty hearing?: No Does the patient have difficulty seeing, even when wearing glasses/contacts?: No Does the patient have difficulty concentrating, remembering, or making decisions?: Yes Patient able to express need for  assistance with ADLs?: Yes Does the patient have difficulty dressing or bathing?: No Independently performs ADLs?: Yes (appropriate for developmental age) Does the patient have difficulty walking or climbing stairs?: No Weakness of Legs: None Weakness of Arms/Hands: None       Abuse/Neglect Assessment (Assessment to be complete while patient is alone) Physical Abuse: Denies Verbal Abuse: Denies Sexual Abuse: Denies Exploitation of patient/patient's resources: Denies Self-Neglect: Denies     Merchant navy officer (For Healthcare) Does Patient Have a Medical Advance Directive?: No Would patient like information on creating a medical advance directive?: No - Patient declined    Additional Information 1:1 In Past 12 Months?: No CIRT Risk: No Elopement Risk: No Does patient have medical clearance?: No     Disposition:  Disposition Initial Assessment Completed for this Encounter: Yes Disposition of Patient: Other dispositions Type of inpatient treatment program: (N/A)  On Site Evaluation by:   Reviewed with Physician:    Alexandria Lodge 07/02/2017 8:43 PM

## 2017-07-02 NOTE — H&P (Signed)
Behavioral Health Medical Screening Exam  Marvin Crawford is an 21 y.o. male. Patient left prior to being seen by this provider.  From TTS Note: Clinician let mother and client know that it may be a few minutes until a provider could do the MSE.  Mother said that she would go get patient something from [employer].  Patient agreed to wait until she got back.  Pt went to lobby to wait because he had asked someone if he could do so.  Pt waited for a few minutes.  Receptionist said he went ahead and left.  -Clinician called the emergency number for patient and did speak to mother.  She said she had gotten him and returned home.  She was invited to bring him back but she said she may just wait.  Clinician asked if she had any concerns about safety.  She said, no that he had never harmed anyone else.  Clinician also gave mother the number for Mobile Crisis Management.        Psychiatric Specialty Exam: Physical Exam  ROS  There were no vitals taken for this visit.There is no height or weight on file to calculate BMI.                                                     There were no vitals taken for this visit.  Recommendations:    Marvin PolingJason A Berry, NP 07/02/2017, 9:53 PM

## 2017-07-04 ENCOUNTER — Encounter (HOSPITAL_COMMUNITY): Payer: Self-pay | Admitting: Emergency Medicine

## 2017-07-04 ENCOUNTER — Emergency Department (HOSPITAL_COMMUNITY)
Admission: EM | Admit: 2017-07-04 | Discharge: 2017-07-05 | Disposition: A | Discharge status: Other Acute Inpatient Hospital | Payer: Medicaid Other | Attending: Emergency Medicine | Admitting: Emergency Medicine

## 2017-07-04 DIAGNOSIS — Z79899 Other long term (current) drug therapy: Secondary | ICD-10-CM | POA: Insufficient documentation

## 2017-07-04 DIAGNOSIS — E876 Hypokalemia: Secondary | ICD-10-CM | POA: Insufficient documentation

## 2017-07-04 DIAGNOSIS — F333 Major depressive disorder, recurrent, severe with psychotic symptoms: Secondary | ICD-10-CM | POA: Diagnosis present

## 2017-07-04 DIAGNOSIS — F17200 Nicotine dependence, unspecified, uncomplicated: Secondary | ICD-10-CM | POA: Insufficient documentation

## 2017-07-04 LAB — CBC WITH DIFFERENTIAL/PLATELET
BASOS PCT: 0 %
Basophils Absolute: 0 10*3/uL (ref 0.0–0.1)
Eosinophils Absolute: 0.1 10*3/uL (ref 0.0–0.7)
Eosinophils Relative: 1 %
HEMATOCRIT: 41.3 % (ref 39.0–52.0)
HEMOGLOBIN: 14.1 g/dL (ref 13.0–17.0)
LYMPHS PCT: 33 %
Lymphs Abs: 2.3 10*3/uL (ref 0.7–4.0)
MCH: 29.1 pg (ref 26.0–34.0)
MCHC: 34.1 g/dL (ref 30.0–36.0)
MCV: 85.2 fL (ref 78.0–100.0)
MONO ABS: 0.4 10*3/uL (ref 0.1–1.0)
MONOS PCT: 5 %
NEUTROS ABS: 4.1 10*3/uL (ref 1.7–7.7)
NEUTROS PCT: 61 %
Platelets: 227 10*3/uL (ref 150–400)
RBC: 4.85 MIL/uL (ref 4.22–5.81)
RDW: 12.9 % (ref 11.5–15.5)
WBC: 6.8 10*3/uL (ref 4.0–10.5)

## 2017-07-04 LAB — COMPREHENSIVE METABOLIC PANEL
ALK PHOS: 70 U/L (ref 38–126)
ALT: 47 U/L (ref 17–63)
ANION GAP: 10 (ref 5–15)
AST: 44 U/L — ABNORMAL HIGH (ref 15–41)
Albumin: 4.3 g/dL (ref 3.5–5.0)
BILIRUBIN TOTAL: 0.8 mg/dL (ref 0.3–1.2)
BUN: 7 mg/dL (ref 6–20)
CALCIUM: 9.6 mg/dL (ref 8.9–10.3)
CO2: 25 mmol/L (ref 22–32)
CREATININE: 0.96 mg/dL (ref 0.61–1.24)
Chloride: 105 mmol/L (ref 101–111)
Glucose, Bld: 96 mg/dL (ref 65–99)
Potassium: 2.9 mmol/L — ABNORMAL LOW (ref 3.5–5.1)
SODIUM: 140 mmol/L (ref 135–145)
TOTAL PROTEIN: 7.3 g/dL (ref 6.5–8.1)

## 2017-07-04 LAB — RAPID URINE DRUG SCREEN, HOSP PERFORMED
Amphetamines: NOT DETECTED
Barbiturates: NOT DETECTED
Benzodiazepines: NOT DETECTED
COCAINE: NOT DETECTED
OPIATES: NOT DETECTED
Tetrahydrocannabinol: NOT DETECTED

## 2017-07-04 LAB — ETHANOL

## 2017-07-04 MED ORDER — ONDANSETRON HCL 4 MG PO TABS: 4.0000 mg | ORAL_TABLET | Freq: Three times a day (TID) | ORAL | Status: DC | PRN

## 2017-07-04 MED ORDER — ACETAMINOPHEN 325 MG PO TABS: 650.0000 mg | ORAL_TABLET | ORAL | Status: DC | PRN

## 2017-07-04 MED ORDER — LORAZEPAM 1 MG PO TABS
1.0000 mg | ORAL_TABLET | ORAL | Status: DC | PRN
  Filled 2017-07-04: qty 1

## 2017-07-04 MED ORDER — ALUM & MAG HYDROXIDE-SIMETH 200-200-20 MG/5ML PO SUSP: 30.0000 mL | Freq: Four times a day (QID) | ORAL | Status: DC | PRN

## 2017-07-04 MED ORDER — ZIPRASIDONE MESYLATE 20 MG IM SOLR: 10.0000 mg | Freq: Two times a day (BID) | INTRAMUSCULAR | Status: DC | PRN

## 2017-07-04 MED ORDER — POTASSIUM CHLORIDE CRYS ER 20 MEQ PO TBCR
40.0000 meq | EXTENDED_RELEASE_TABLET | Freq: Two times a day (BID) | ORAL | Status: DC
  Administered 2017-07-04: 40 meq via ORAL
  Filled 2017-07-04 (×2): qty 2

## 2017-07-04 NOTE — ED Notes (Signed)
Pt presents with depression, not taking meds regularly.  Punched a wall at home.  A&O x 3, no distress noted, calm & cooperative.  Monitoring for safety, Q 15 min checks in effect.    Denies SI, HI or AVH.

## 2017-07-04 NOTE — ED Notes (Signed)
[facility] Expected date:  Expected time:  Means of arrival:  Comments: [employer] voluntary/depressed/anxious

## 2017-07-04 NOTE — ED Notes (Signed)
PA at bedside.

## 2017-07-04 NOTE — ED Triage Notes (Addendum)
Brought in by [employer], pt. Here voluntary for depression. Calm and cooperative, ambulatory. Per report from [employer],  mother called [employer] and reported that pt. Became agitated, punched the wall and walked out. [employer] found patient walking around neighborhood, calm and submitted himself to [employer]  to be taken here to the hospital for his depression . Pt. Reported of not taking his medicines regularly. No s/s of distress. Denied SI/HI.

## 2017-07-04 NOTE — ED Provider Notes (Signed)
[facility]-EMERGENCY DEPT Provider Note   CSN: [id number] Arrival date & time: 07/04/17  2002     History   Chief Complaint Chief Complaint  Patient presents with  . Depression    HPI Marvin Crawford is a 21 y.o. male with a history of anxiety, depression, and ADD [place] [facility].  Patient's mother called the police after patient got frustrated at the house and punched a wall, then left to walk around the neighborhood.  Police found patient in the neighborhood, he was cooperative and calm, was transported to the emergency department.  Patient relays he has not been taking his medications appropriately, no further information provided about this.  Reports depression and anxiety.  Patient with varying history with myself, nursing staff, and police.  To me he reports SI/HI, to others denies this.  States his plan for both SI/HI is to "walk around and listen to music ."  Patient is a poor historian.  No other complaints at this time. Denies hand pain, numbness, or tingling.   HPI  Past Medical History:  Diagnosis Date  . ADD (attention deficit disorder)   . Anxiety   . Depression   . Environmental allergies     Patient Active Problem List   Diagnosis Date Noted  . 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 Stuttering 08/09/2015  . Cannabis use disorder, moderate, dependence (HCC) 08/09/2015  . MDD (major depressive disorder), recurrent, severe, with psychosis (HCC) 02/16/2013    History reviewed. No pertinent surgical history.     Home Medications    Prior to Admission medications   Medication Sig Start Date End Date Taking? Authorizing Provider  ARIPiprazole (ABILIFY) 2 MG tablet Take 2 mg by mouth 2 (two) times daily.   Yes [provider]  ARIPiprazole ER (ABILIFY MAINTENA) 400 MG SRER Inject 400 mg into the muscle every 30 (thirty) days.   Yes [provider]  cholecalciferol  (VITAMIN D) 1000 units tablet Take 1,000 Units by mouth daily.   Yes [provider]  divalproex (DEPAKOTE ER) 250 MG 24 hr tablet Take 250 mg by mouth 2 (two) times daily. 06/15/17  Yes [provider]  FLUoxetine (PROZAC) 20 MG capsule Take 20 mg by mouth daily. 06/15/17  Yes [provider]  hydrOXYzine (ATARAX/VISTARIL) 25 MG tablet Take 1 tablet (25 mg total) by mouth every 6 (six) hours as needed for anxiety. 05/15/17  Yes Oneta RackLewis, Tanika N, NP  QUEtiapine (SEROQUEL) 25 MG tablet Take 1 tablet (25 mg total) by mouth 2 (two) times daily. Take 3 tablet (75mg  total) by mouth at bed time Patient taking differently: Take 25 mg by mouth at bedtime.  05/15/17  Yes Oneta RackLewis, Tanika N, NP  ARIPiprazole (ABILIFY) 10 MG tablet Take 10 mg by mouth daily. 06/15/17   [provider]  escitalopram (LEXAPRO) 20 MG tablet Take 1 tablet (20 mg total) by mouth daily. Patient not taking: Reported on 07/04/2017 05/16/17   Oneta RackLewis, Tanika N, NP  famotidine (PEPCID) 20 MG tablet Take 1 tablet (20 mg total) by mouth 2 (two) times daily. Patient not taking: Reported on 07/04/2017 09/16/15   Adonis BrookAgustin, Sheila, NP  hydrOXYzine (VISTARIL) 25 MG capsule TAKE ONE CAPSULE BY MOUTH EVERY 6 HOURS AS NEEDED 06/15/17   [provider]  ibuprofen (ADVIL,MOTRIN) 200 MG tablet Take 200 mg by mouth every 8 (eight) hours as needed (pain).    [provider]  traZODone (DESYREL) 50  MG tablet Take 1 tablet (50 mg total) by mouth at bedtime as needed for sleep. Patient not taking: Reported on 07/04/2017 05/15/17   Oneta Rack, NP    Family History Family History  Problem Relation Age of Onset  . Hypertension Mother   . Hypertension Father   . Mental illness Neg Hx     Social History Social History   Tobacco Use  . Smoking status: Current Every Day Smoker    Packs/day: 2.00  . Smokeless tobacco: Never Used  Substance Use Topics  . Alcohol use: No    Alcohol/week: 0.0 oz  . Drug  use: Yes    Types: Other-see comments, Marijuana    Comment: oil smoked in pipe calls "D[employer]", friends buy for him     Allergies   Patient has no known allergies.   Review of Systems Review of Systems  Constitutional: Negative for chills and fever.  Respiratory: Negative for shortness of breath.   Cardiovascular: Negative for chest pain.  Gastrointestinal: Negative for abdominal pain.  Musculoskeletal: Negative for arthralgias and myalgias.  Neurological: Negative for weakness and numbness.  Psychiatric/Behavioral: Positive for suicidal ideas. The patient is nervous/anxious.   All other systems reviewed and are negative.    Physical Exam Updated Vital Signs BP 128/78 (BP Location: Right Arm)   Pulse 68   Temp 97.8 F (36.6 C) (Oral)   Resp 16   SpO2 99%   Physical Exam  Constitutional: He is oriented to person, place, and time. He appears well-developed and well-nourished. No distress.  HENT:  Head: Normocephalic and atraumatic.  Eyes: Conjunctivae are normal. Pupils are equal, round, and reactive to light. Right eye exhibits no discharge. Left eye exhibits no discharge.  Cardiovascular: Normal rate and regular rhythm.  No murmur heard. Pulses:      Radial pulses are 2+ on the right side, and 2+ on the left side.  Pulmonary/Chest: Breath sounds normal. No respiratory distress. He has no wheezes. He has no rales.  Abdominal: Soft. He exhibits no distension. There is no tenderness.  Musculoskeletal:  Upper extremities: Full range of motion of bilateral elbows, wrists, digits.  No bony tenderness.  No snuffbox tenderness.   Neurological: He is alert and oriented to person, place, and time.  Clear speech. Sensation grossly intact to bilateral upper extremities. 5/5 grip strength bilaterally.   Skin: Skin is warm and dry. No rash noted.  Psychiatric: His speech is normal and behavior is normal.  Flat affect. Patient is cooperative and calm.   Nursing note and vitals  reviewed.    ED Treatments / Results  Labs Results for orders placed or performed during the hospital encounter of 07/04/17  Comprehensive metabolic panel  Result Value Ref Range   Sodium 140 135 - 145 mmol/L   Potassium 2.9 (L) 3.5 - 5.1 mmol/L   Chloride 105 101 - 111 mmol/L   CO2 25 22 - 32 mmol/L   Glucose, Bld 96 65 - 99 mg/dL   BUN 7 6 - 20 mg/dL   Creatinine, Ser 0.98 0.61 - 1.24 mg/dL   Calcium 9.6 8.9 - 11.9 mg/dL   Total Protein 7.3 6.5 - 8.1 g/dL   Albumin 4.3 3.5 - 5.0 g/dL   AST 44 (H) 15 - 41 U/L   ALT 47 17 - 63 U/L   Alkaline Phosphatase 70 38 - 126 U/L   Total Bilirubin 0.8 0.3 - 1.2 mg/dL   GFR calc non Af Amer >60 >60 mL/min  GFR calc Af Amer >60 >60 mL/min   Anion gap 10 5 - 15  Ethanol  Result Value Ref Range   Alcohol, Ethyl (B) <10 <10 mg/dL  Urine rapid drug screen (hosp performed)  Result Value Ref Range   Opiates NONE DETECTED NONE DETECTED   Cocaine NONE DETECTED NONE DETECTED   Benzodiazepines NONE DETECTED NONE DETECTED   Amphetamines NONE DETECTED NONE DETECTED   Tetrahydrocannabinol NONE DETECTED NONE DETECTED   Barbiturates NONE DETECTED NONE DETECTED  CBC with Differential  Result Value Ref Range   WBC 6.8 4.0 - 10.5 K/uL   RBC 4.85 4.22 - 5.81 MIL/uL   Hemoglobin 14.1 13.0 - 17.0 g/dL   HCT 16.141.3 09.639.0 - 04.552.0 %   MCV 85.2 78.0 - 100.0 fL   MCH 29.1 26.0 - 34.0 pg   MCHC 34.1 30.0 - 36.0 g/dL   RDW 40.912.9 81.111.5 - 91.415.5 %   Platelets 227 150 - 400 K/uL   Neutrophils Relative % 61 %   Neutro Abs 4.1 1.7 - 7.7 K/uL   Lymphocytes Relative 33 %   Lymphs Abs 2.3 0.7 - 4.0 K/uL   Monocytes Relative 5 %   Monocytes Absolute 0.4 0.1 - 1.0 K/uL   Eosinophils Relative 1 %   Eosinophils Absolute 0.1 0.0 - 0.7 K/uL   Basophils Relative 0 %   Basophils Absolute 0.0 0.0 - 0.1 K/uL   No results found. Radiology No results found.  Procedures Procedures (including critical care time)  Medications Ordered in ED Medications  potassium  chloride SA (K-DUR,KLOR-CON) CR tablet 40 mEq (40 mEq Oral Given 07/04/17 2227)  acetaminophen (TYLENOL) tablet 650 mg (not administered)  ondansetron (ZOFRAN) tablet 4 mg (not administered)  alum & mag hydroxide-simeth (MAALOX/MYLANTA) 200-200-20 MG/5ML suspension 30 mL (not administered)  ziprasidone (GEODON) injection 10 mg (not administered)    And  LORazepam (ATIVAN) tablet 1 mg (not administered)    Initial Impression / Assessment and Plan / ED Course  I have reviewed the triage vital signs and the nursing notes.  Pertinent labs & imaging results that were available during my care of the patient were reviewed by me and considered in my medical decision making (see chart for details).  Patient presents to the emergency department for medical clearance.  Patient is nontoxic-appearing, in no apparent distress, with stable vital signs. Denies hand pain s/p hitting wall, no bony tenderness in the hand, no snuff box tenderness, no skin abnormalities, NVI distally doubt fracture or dislocation.  Screening labs revealed hypokalemia will orally replace with 40mEq at this time with additional dose scheduled for tomorrow.   22:30: Patient medically cleared for psychiatry. Psych holding orders and TTS consult placed.   Final Clinical Impressions(s) / ED Diagnoses   Final diagnoses:  Hypokalemia    ED Discharge Orders    None       Cherly Andersonetrucelli, Lucee Brissett R, PA-C 07/04/17 2239    Charlynne PanderYao, David Hsienta, MD 07/05/17 2023

## 2017-07-05 ENCOUNTER — Inpatient Hospital Stay (HOSPITAL_COMMUNITY)
Admission: AD | Admit: 2017-07-05 | Discharge: 2017-07-10 | DRG: 885 | Disposition: A | Discharge status: Home / Self Care | Payer: Medicaid Other | Source: Intra-hospital | Attending: Psychiatry | Admitting: Psychiatry

## 2017-07-05 ENCOUNTER — Encounter (HOSPITAL_COMMUNITY): Payer: Self-pay

## 2017-07-05 DIAGNOSIS — R45851 Suicidal ideations: Secondary | ICD-10-CM | POA: Diagnosis present

## 2017-07-05 DIAGNOSIS — Z8249 Family history of ischemic heart disease and other diseases of the circulatory system: Secondary | ICD-10-CM | POA: Diagnosis not present

## 2017-07-05 DIAGNOSIS — R4183 Borderline intellectual functioning: Secondary | ICD-10-CM | POA: Diagnosis present

## 2017-07-05 DIAGNOSIS — F8081 Childhood onset fluency disorder: Secondary | ICD-10-CM | POA: Diagnosis present

## 2017-07-05 DIAGNOSIS — F39 Unspecified mood [affective] disorder: Secondary | ICD-10-CM | POA: Diagnosis not present

## 2017-07-05 DIAGNOSIS — Z79899 Other long term (current) drug therapy: Secondary | ICD-10-CM

## 2017-07-05 DIAGNOSIS — F250 Schizoaffective disorder, bipolar type: Secondary | ICD-10-CM | POA: Diagnosis not present

## 2017-07-05 DIAGNOSIS — F988 Other specified behavioral and emotional disorders with onset usually occurring in childhood and adolescence: Secondary | ICD-10-CM | POA: Diagnosis present

## 2017-07-05 DIAGNOSIS — F1910 Other psychoactive substance abuse, uncomplicated: Secondary | ICD-10-CM | POA: Diagnosis not present

## 2017-07-05 DIAGNOSIS — G4700 Insomnia, unspecified: Secondary | ICD-10-CM | POA: Diagnosis not present

## 2017-07-05 DIAGNOSIS — R440 Auditory hallucinations: Secondary | ICD-10-CM | POA: Diagnosis not present

## 2017-07-05 DIAGNOSIS — W2201XA Walked into wall, initial encounter: Secondary | ICD-10-CM | POA: Diagnosis present

## 2017-07-05 DIAGNOSIS — F259 Schizoaffective disorder, unspecified: Secondary | ICD-10-CM

## 2017-07-05 DIAGNOSIS — F419 Anxiety disorder, unspecified: Secondary | ICD-10-CM | POA: Diagnosis present

## 2017-07-05 DIAGNOSIS — R450 Nervousness: Secondary | ICD-10-CM | POA: Diagnosis not present

## 2017-07-05 DIAGNOSIS — F17210 Nicotine dependence, cigarettes, uncomplicated: Secondary | ICD-10-CM | POA: Diagnosis not present

## 2017-07-05 DIAGNOSIS — F333 Major depressive disorder, recurrent, severe with psychotic symptoms: Secondary | ICD-10-CM | POA: Diagnosis present

## 2017-07-05 MED ORDER — TRAZODONE HCL 50 MG PO TABS
50.0000 mg | ORAL_TABLET | Freq: Every evening | ORAL | Status: DC | PRN
  Administered 2017-07-06: 50 mg via ORAL
  Filled 2017-07-05 (×2): qty 1

## 2017-07-05 MED ORDER — ZIPRASIDONE MESYLATE 20 MG IM SOLR: 10.0000 mg | Freq: Two times a day (BID) | INTRAMUSCULAR | Status: DC | PRN

## 2017-07-05 MED ORDER — ACETAMINOPHEN 325 MG PO TABS: 650.0000 mg | ORAL_TABLET | ORAL | Status: DC | PRN

## 2017-07-05 MED ORDER — ARIPIPRAZOLE 2 MG PO TABS
2.0000 mg | ORAL_TABLET | Freq: Two times a day (BID) | ORAL | Status: DC
  Filled 2017-07-05: qty 1

## 2017-07-05 MED ORDER — ARIPIPRAZOLE 10 MG PO TABS
10.0000 mg | ORAL_TABLET | Freq: Every day | ORAL | Status: DC
  Administered 2017-07-06: 10 mg via ORAL
  Filled 2017-07-05 (×2): qty 1

## 2017-07-05 MED ORDER — ARIPIPRAZOLE 10 MG PO TABS
10.0000 mg | ORAL_TABLET | Freq: Every day | ORAL | Status: DC
  Administered 2017-07-05: 10 mg via ORAL
  Filled 2017-07-05: qty 1

## 2017-07-05 MED ORDER — MAGNESIUM HYDROXIDE 400 MG/5ML PO SUSP
30.0000 mL | Freq: Every day | ORAL | Status: DC | PRN
  Administered 2017-07-08: 30 mL via ORAL
  Filled 2017-07-05: qty 30

## 2017-07-05 MED ORDER — FLUOXETINE HCL 20 MG PO CAPS
20.0000 mg | ORAL_CAPSULE | Freq: Every day | ORAL | Status: DC
  Administered 2017-07-05: 20 mg via ORAL
  Filled 2017-07-05: qty 1

## 2017-07-05 MED ORDER — TRAZODONE HCL 50 MG PO TABS: 50.0000 mg | ORAL_TABLET | Freq: Every evening | ORAL | Status: DC | PRN

## 2017-07-05 MED ORDER — HYDROXYZINE HCL 25 MG PO TABS
25.0000 mg | ORAL_TABLET | Freq: Four times a day (QID) | ORAL | Status: DC | PRN
  Administered 2017-07-06 – 2017-07-07 (×2): 25 mg via ORAL
  Filled 2017-07-05 (×3): qty 1

## 2017-07-05 MED ORDER — ALUM & MAG HYDROXIDE-SIMETH 200-200-20 MG/5ML PO SUSP: 30.0000 mL | Freq: Four times a day (QID) | ORAL | Status: DC | PRN

## 2017-07-05 MED ORDER — FLUOXETINE HCL 20 MG PO CAPS
20.0000 mg | ORAL_CAPSULE | Freq: Every day | ORAL | Status: DC
  Administered 2017-07-06 – 2017-07-10 (×5): 20 mg via ORAL
  Filled 2017-07-05 (×8): qty 1

## 2017-07-05 MED ORDER — ONDANSETRON HCL 4 MG PO TABS
4.0000 mg | ORAL_TABLET | Freq: Three times a day (TID) | ORAL | Status: DC | PRN
  Administered 2017-07-10: 4 mg via ORAL
  Filled 2017-07-05: qty 1

## 2017-07-05 MED ORDER — HYDROXYZINE HCL 25 MG PO TABS
25.0000 mg | ORAL_TABLET | Freq: Four times a day (QID) | ORAL | Status: DC | PRN
  Administered 2017-07-05: 25 mg via ORAL
  Filled 2017-07-05: qty 1

## 2017-07-05 MED ORDER — HYDROXYZINE PAMOATE 25 MG PO CAPS: 25.0000 mg | ORAL_CAPSULE | Freq: Four times a day (QID) | ORAL | Status: DC | PRN

## 2017-07-05 NOTE — BH Assessment (Signed)
[facility] Assessment Progress Note  Per Thedore MinsMojeed Akintayo, MD, this pt requires psychiatric hospitalization at this time.  Marvin Heinrichina Tate, RN, [facility]m [id number]; they will be ready to accept pt after 18:00.  Pt has signed Voluntary Admission and Consent for Treatment, as well as Consent to Release Information to no one, and signed forms have been faxed to [facility].  Pt's nurse, Kendal Hymendie, has been notified, and agrees to send original paperwork along with pt via [facility], and to call report to [phone].  Doylene Canninghomas Javel Hersh, MA Triage Specialist [phone]

## 2017-07-05 NOTE — [facility] Counselor (Signed)
Attempted to call Mom to get collateral but she did not answer. Left HIPPA compliant message.   [address]Kristin Hazelgrace Bonham Willis WharfLPC, LCAS

## 2017-07-05 NOTE — Progress Notes (Signed)
07/05/17 1359:  LRT introduced self to pt and offered activities, pt was just starting to eat his lunch.   Caroll RancherMarjette Kayna Suppa, LRT/CTRS

## 2017-07-05 NOTE — ED Notes (Signed)
Pt has a very blunted affect and is really slow to respond.  He was unable to take his potassium earlier due to the size of the pill.  When I attempted to give him his Abilify he refused to take it because it was "the wrong dose and color".
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
## 2017-07-05 NOTE — [facility] Counselor (Signed)
Spoke to pt's mom Marvin Crawford who states that this is not his baseline and she is worried about his safety. She states that his behavior started to change in September after he smoked marijuana. She states that he has had 2 other episodes of confusion, delusions and hearing voices but these cleared up within two weeks. She states that this has been going on since September and he has been confused, not oriented, looses track of time, walks so far that he forgets where he is and she is worried about his safety. She states that she feels like his medications are not working and he needs to be monitored until he is back at his baseline. Mom denies that pt has a intellectual disability and states that he was tested at age 21 and deemed "not disabled". She states that in school he did have a "learning disability". Mom states that before this episode he was driving by himself, taking care of himself and working at [employer].   Per Dr. Mervyn SkeetersA and Nanine MeansJamison Lord NP pt meets inpatient criteria for safety and stability.    Kateri PlummerKristin Tanaka Gillen, [place]AS, [facility] Lead Triage Specialist  [facility]  Therapeutic Triage Services Phone: [phone] Fax: [phone]

## 2017-07-05 NOTE — ED Notes (Signed)
[facility].  Pending [facility] transport.

## 2017-07-05 NOTE — ED Notes (Signed)
Pt A&O x 3, no distress noted, calm & cooperative.  Monitoring for safety, Q 15 min checks in effect.    Pending report to [facility] and [facility] transport. 

## 2017-07-06 ENCOUNTER — Other Ambulatory Visit: Payer: Self-pay

## 2017-07-06 DIAGNOSIS — F419 Anxiety disorder, unspecified: Secondary | ICD-10-CM

## 2017-07-06 DIAGNOSIS — F1910 Other psychoactive substance abuse, uncomplicated: Secondary | ICD-10-CM

## 2017-07-06 DIAGNOSIS — R450 Nervousness: Secondary | ICD-10-CM

## 2017-07-06 DIAGNOSIS — R4183 Borderline intellectual functioning: Secondary | ICD-10-CM

## 2017-07-06 DIAGNOSIS — R440 Auditory hallucinations: Secondary | ICD-10-CM

## 2017-07-06 DIAGNOSIS — F259 Schizoaffective disorder, unspecified: Secondary | ICD-10-CM

## 2017-07-06 MED ORDER — ARIPIPRAZOLE 15 MG PO TABS
15.0000 mg | ORAL_TABLET | Freq: Every day | ORAL | Status: DC
  Administered 2017-07-07 – 2017-07-10 (×4): 15 mg via ORAL
  Filled 2017-07-06 (×7): qty 1

## 2017-07-06 NOTE — [facility] Suicide Risk Assessment (Signed)
[facility]   Nursing information obtained from:  Patient Demographic factors:  Male, Adolescent or young adult Current Mental Status:  Suicidal ideation indicated by patient Loss Factors:  NA Historical Factors:  Impulsivity, Prior suicide attempts Risk Reduction Factors:  Living with another person, especially a relative  Total Time spent with patient: 1 hour Principal Problem: Schizoaffective disorder (HCC) Diagnosis:   Patient Active Problem List   Diagnosis Date Noted  . Schizoaffective disorder (HCC) [F25.9] 07/06/2017  . 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 Stuttering [F80.81] 08/09/2015  . Cannabis use disorder, moderate, dependence (HCC) [F12.20] 08/09/2015  . MDD (major depressive disorder), recurrent, severe, with psychosis (HCC) [F33.3] 02/16/2013   Subjective Data:  See H&P for full HPI Marvin Crawford is 21 y/o M with history of schizoaffective disorder, borderline intellectual functioning, and cannabis use disorder who was admitted voluntarily with worsening depression, SI with plan to shoot self or use gas from a fireplace, and worsening AH. [facility] complaints, and his outpatient medications were changed after discharge. He reports poor adherence to medications after discharge, but he is agreeable to be restarted on abilify and prozac.   Continued Clinical Symptoms:  Alcohol Use Disorder Identification Test Final Score (AUDIT): 0 The "Alcohol Use Disorders Identification Test", Guidelines for Use in Primary Care, Second Edition.  World Science writerHealth Organization [facility](WHO). Score between 0-7:  no or low risk or alcohol related problems. Score between 8-15:  moderate risk of alcohol related problems. Score between 16-19:  high risk of alcohol related problems. Score 20 or above:  warrants further  diagnostic evaluation for alcohol dependence and treatment.   CLINICAL FACTORS:   Depression:   Severe Alcohol/Substance Abuse/Dependencies Schizophrenia:   Paranoid or undifferentiated type Currently Psychotic Unstable or Poor Therapeutic Relationship Previous Psychiatric Diagnoses and Treatments Medical Diagnoses and Treatments/Surgeries   Musculoskeletal: Strength & Muscle Tone: within normal limits Gait & Station: normal Patient leans: N/A  Psychiatric Specialty Exam: Physical Exam  Nursing note and vitals reviewed.   Review of Systems  Constitutional: Negative for chills and fever.  Cardiovascular: Negative for chest pain.  Gastrointestinal: Negative for abdominal pain, heartburn, nausea and vomiting.  Psychiatric/Behavioral: Positive for depression, hallucinations and suicidal ideas. The patient is nervous/anxious.     Blood pressure (!) 147/75, pulse 93, temperature 98 F (36.7 C), temperature source Oral, resp. rate 18, height 5\' 11"  (1.803 m), weight 101.6 kg (224 lb).Body mass index is 31.24 kg/m.  General Appearance: Casual and Fairly Groomed  Eye Contact:  Good  Speech:  Clear and Coherent  Volume:  Normal  Mood:  Anxious  Affect:  Congruent and Flat  Thought Process:  Coherent and Goal Directed  Orientation:  Full (Time, Place, and Person)  Thought Content:  Hallucinations: Auditory  Suicidal Thoughts:  Yes.  with intent/plan  Homicidal Thoughts:  No  Memory:  Immediate;   Good Recent;   Good Remote;   Good  Judgement:  Fair  Insight:  Fair  Psychomotor Activity:  Normal  Concentration:  Concentration: Good  Recall:  Good  Fund of Knowledge:  Good  Language:  Good  Akathisia:  No  Handed:    AIMS (if indicated):     Assets:  Communication Skills Leisure Time Physical Health Resilience Social Support  ADL's:  Intact  Cognition:  WNL  Sleep:  Number of Hours: 6.75  COGNITIVE FEATURES THAT CONTRIBUTE TO RISK:  None    SUICIDE RISK:    Moderate:  Frequent suicidal ideation with limited intensity, and duration, some specificity in terms of plans, no associated intent, good self-control, limited dysphoria/symptomatology, some risk factors present, and identifiable protective factors, including available and accessible social support.  PLAN OF CARE:   - Admit to inpatient level of care  - Schizoaffective disorder depressive type vs MDD with psychotic features  - Change abilify 10mg  qDay to abilify 15mg  qDay  - Continue prozac 20mg  qDay  - Encourage participation in groups and therapeutic milieu - Discharge planning will be ongoing   I certify that inpatient services furnished can reasonably be expected to improve the patient's condition.   Micheal Likenshristopher T Cherlyn Syring, MD 07/06/2017, 3:03 PM
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
## 2017-07-06 NOTE — H&P (Signed)
Psychiatric Admission Assessment Adult  Patient Identification: Marvin Crawford MRN:  [id number] Date of Evaluation:  07/06/2017 Chief Complaint:  SCHIZOAFFECTIVE DISORDER Principal Diagnosis: Schizoaffective disorder ([place]) Diagnosis:   Patient Active Problem List   Diagnosis Date Noted  . Schizoaffective disorder ([place]) [F25.9] 07/06/2017  . Major depressive disorder, recurrent, severe with psychotic features ([place]) [F33.3] 07/05/2017  . 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 Stuttering [F80.81] 08/09/2015  . Cannabis use disorder, moderate, dependence ([place]) [F12.20] 08/09/2015  . MDD (major depressive disorder), recurrent, severe, with psychosis ([place]) [F33.3] 02/16/2013   History of Present Illness:  Marvin Crawford is a 21 y/o M with history of schizoaffective disorder vs MDD with psychotic features who was admitted voluntarily with worsening symptoms of psychosis, agitation, depression, SI, and disorganization. Pt was brought in by police when his mother called emergency services after pt had episode of agitation and punched a wall. Pt then told police that he had depression, anxiety, and SI with plan use the gas from the fireplace. He was voluntarily taken to the ED and transferred to [facility]. Upon evaluation, pt reports that he has been struggling with depression and feeling overwhelmed which resulted in punching a hole in the wall. He explains, "I busted a hole in the wall, and I was mad at life and stuff." He endorses SI with plan to "shoot myself" and pt notes that he does not own a gun but could obtain access to one. He denies HI. He reports AH of "black and white things" and when asked to provide further detail, he adds, "I hear visual stuff." He denies that the [facility] command him to do things. Pt reports depressive symptoms of anhedonia, guilty feelings, low energy, poor concentration, and poor appetite. He denies symptoms of mania, OCD, and PTSD. He reports using about "1 gram a  day" of cannabis and daily use of 1 drink of alcohol. Discussed with patient about treatment options. He was discharged from [facility] inpatient on 05/09/17 on combination of lexapro and seroquel, and he reports that it was changed on an outpatient basis at [facility] to abilify and prozac in the last 2 weeks. We discussed increasing dose of abilify at this time and pt was in agreement. He had no further questions, comments, or concerns.   Associated Signs/Symptoms: Depression Symptoms:  depressed mood, anhedonia, feelings of worthlessness/guilt, difficulty concentrating, suicidal thoughts without plan, anxiety, loss of energy/fatigue, decreased appetite, (Hypo) Manic Symptoms:  Hallucinations, Anxiety Symptoms:  Excessive Worry, Psychotic Symptoms:  Delusions, Hallucinations: Auditory PTSD Symptoms: NA Total Time spent with patient: 1 hour  Past Psychiatric History:  - dx of schizoaffective and MDD with psychotic features - Outpatient at [facility] - Inpatient multiple times with last DC from [facility] on 05/09/17 - denies SA hx  Is the patient at risk to self? Yes.    Has the patient been a risk to self in the past 6 months? Yes.    Has the patient been a risk to self within the distant past? Yes.    Is the patient a risk to others? Yes.    Has the patient been a risk to others in the past 6 months? Yes.    Has the patient been a risk to others within the distant past? Yes.     Prior Inpatient Therapy:   Prior Outpatient Therapy:    Alcohol Screening: 1. How often do you have a drink containing alcohol?: Never 2. How many drinks containing alcohol do you have  on a typical day when you are drinking?: 1 or 2 3. How often do you have six or more drinks on one occasion?: Never AUDIT-C Score: 0 9. Have you or someone else been injured as a result of your drinking?: No 10. Has a relative or friend or a doctor or another health worker been concerned about your drinking  or suggested you cut down?: No Alcohol Use Disorder Identification Test Final Score (AUDIT): 0 Intervention/Follow-up: AUDIT Score <7 follow-up not indicated Substance Abuse History in the last 12 months:  Yes.   Consequences of Substance Abuse: Medical Consequences:  worsened psychosis Previous Psychotropic Medications: Yes  Psychological Evaluations: Yes  Past Medical History:  Past Medical History:  Diagnosis Date  . ADD (attention deficit disorder)   . Anxiety   . Depression   . Environmental allergies    History reviewed. No pertinent surgical history. Family History:  Family History  Problem Relation Age of Onset  . Hypertension Mother   . Hypertension Father   . Mental illness Neg Hx    Family Psychiatric  History: pt denies family psychiatric history Tobacco Screening: Have you used any form of tobacco in the last 30 days? (Cigarettes, Smokeless Tobacco, Cigars, and/or Pipes): No Social History:  Social History   Substance and Sexual Activity  Alcohol Use No  . Alcohol/week: 0.0 oz     Social History   Substance and Sexual Activity  Drug Use Yes  . Types: Other-see comments, Marijuana   Comment: oil smoked in pipe calls "D[employer]", friends buy for him    Additional Social History:                           Allergies:  No Known Allergies Lab Results:  Results for orders placed or performed during the hospital encounter of 07/04/17 (from the past 48 hour(s))  Comprehensive metabolic panel     Status: Abnormal   Collection Time: 07/04/17  9:33 PM  Result Value Ref Range   Sodium 140 135 - 145 mmol/L   Potassium 2.9 (L) 3.5 - 5.1 mmol/L   Chloride 105 101 - 111 mmol/L   CO2 25 22 - 32 mmol/L   Glucose, Bld 96 65 - 99 mg/dL   BUN 7 6 - 20 mg/dL   Creatinine, Ser 0.96 0.61 - 1.24 mg/dL   Calcium 9.6 8.9 - 10.3 mg/dL   Total Protein 7.3 6.5 - 8.1 g/dL   Albumin 4.3 3.5 - 5.0 g/dL   AST 44 (H) 15 - 41 U/L   ALT 47 17 - 63 U/L   Alkaline Phosphatase 70  38 - 126 U/L   Total Bilirubin 0.8 0.3 - 1.2 mg/dL   GFR calc non Af Amer >60 >60 mL/min   GFR calc Af Amer >60 >60 mL/min    Comment: (NOTE) The eGFR has been calculated using the CKD EPI equation. This calculation has not been validated in all clinical situations. eGFR's persistently <60 mL/min signify possible Chronic Kidney Disease.    Anion gap 10 5 - 15  Ethanol     Status: None   Collection Time: 07/04/17  9:33 PM  Result Value Ref Range   Alcohol, Ethyl (B) <10 <10 mg/dL    Comment:        LOWEST DETECTABLE LIMIT FOR SERUM ALCOHOL IS 10 mg/dL FOR MEDICAL PURPOSES ONLY   Urine rapid drug screen (hosp performed)     Status: None   Collection Time:  07/04/17  9:33 PM  Result Value Ref Range   Opiates NONE DETECTED NONE DETECTED   Cocaine NONE DETECTED NONE DETECTED   Benzodiazepines NONE DETECTED NONE DETECTED   Amphetamines NONE DETECTED NONE DETECTED   Tetrahydrocannabinol NONE DETECTED NONE DETECTED   Barbiturates NONE DETECTED NONE DETECTED    Comment:        DRUG SCREEN FOR MEDICAL PURPOSES ONLY.  IF CONFIRMATION IS NEEDED FOR ANY PURPOSE, NOTIFY LAB WITHIN 5 DAYS.        LOWEST DETECTABLE LIMITS FOR URINE DRUG SCREEN Drug Class       Cutoff (ng/mL) Amphetamine      1000 Barbiturate      200 Benzodiazepine   865 Tricyclics       784 Opiates          300 Cocaine          300 THC              50   CBC with Differential     Status: None   Collection Time: 07/04/17  9:33 PM  Result Value Ref Range   WBC 6.8 4.0 - 10.5 K/uL   RBC 4.85 4.22 - 5.81 MIL/uL   Hemoglobin 14.1 13.0 - 17.0 g/dL   HCT 41.3 39.0 - 52.0 %   MCV 85.2 78.0 - 100.0 fL   MCH 29.1 26.0 - 34.0 pg   MCHC 34.1 30.0 - 36.0 g/dL   RDW 12.9 11.5 - 15.5 %   Platelets 227 150 - 400 K/uL   Neutrophils Relative % 61 %   Neutro Abs 4.1 1.7 - 7.7 K/uL   Lymphocytes Relative 33 %   Lymphs Abs 2.3 0.7 - 4.0 K/uL   Monocytes Relative 5 %   Monocytes Absolute 0.4 0.1 - 1.0 K/uL   Eosinophils  Relative 1 %   Eosinophils Absolute 0.1 0.0 - 0.7 K/uL   Basophils Relative 0 %   Basophils Absolute 0.0 0.0 - 0.1 K/uL    Blood Alcohol level:  Lab Results  Component Value Date   ETH <10 07/04/2017   ETH <10 69/62/9528    Metabolic Disorder Labs:  Lab Results  Component Value Date   HGBA1C 5.2 05/10/2017   MPG 102.54 05/10/2017   MPG 117 09/11/2015   Lab Results  Component Value Date   PROLACTIN 6.5 05/10/2017   PROLACTIN 2.1 (L) 09/11/2015   Lab Results  Component Value Date   CHOL 125 05/10/2017   TRIG 54 05/10/2017   HDL 35 (L) 05/10/2017   CHOLHDL 3.6 05/10/2017   VLDL 11 05/10/2017   LDLCALC 79 05/10/2017   LDLCALC 79 09/11/2015    Current Medications: Current Facility-Administered Medications  Medication Dose Route Frequency Provider Last Rate Last Dose  . acetaminophen (TYLENOL) tablet 650 mg  650 mg Oral Q4H PRN Patrecia Pour, NP      . alum & mag hydroxide-simeth (MAALOX/MYLANTA) 200-200-20 MG/5ML suspension 30 mL  30 mL Oral Q6H PRN Patrecia Pour, NP      . Derrill Memo ON 07/07/2017] ARIPiprazole (ABILIFY) tablet 15 mg  15 mg Oral Daily Pennelope Bracken, MD      . FLUoxetine (PROZAC) capsule 20 mg  20 mg Oral Daily Patrecia Pour, NP   20 mg at 07/06/17 4132  . hydrOXYzine (ATARAX/VISTARIL) tablet 25 mg  25 mg Oral Q6H PRN Patrecia Pour, NP      . magnesium hydroxide (MILK OF MAGNESIA) suspension 30 mL  30 mL  Oral Daily PRN Patrecia Pour, NP      . ondansetron [facility]) tablet 4 mg  4 mg Oral Q8H PRN Patrecia Pour, NP      . traZODone (DESYREL) tablet 50 mg  50 mg Oral QHS PRN Patrecia Pour, NP      . ziprasidone (GEODON) injection 10 mg  10 mg Intramuscular Q12H PRN Patrecia Pour, NP       PTA Medications: Medications Prior to Admission  Medication Sig Dispense Refill Last Dose  . ARIPiprazole (ABILIFY) 10 MG tablet Take 10 mg by mouth daily.  0 Not Taking at Unknown time  . ARIPiprazole (ABILIFY) 2 MG tablet Take 2 mg by mouth 2 (two)  times daily.   07/04/2017 at Unknown time  . ARIPiprazole ER (ABILIFY MAINTENA) 400 MG SRER Inject 400 mg into the muscle every 30 (thirty) days.   06/18/2017 at unknown time  . cholecalciferol (VITAMIN D) 1000 units tablet Take 1,000 Units by mouth daily.   Past Week at Unknown time  . divalproex (DEPAKOTE ER) 250 MG 24 hr tablet Take 250 mg by mouth 2 (two) times daily.  0 07/04/2017 at 0800  . escitalopram (LEXAPRO) 20 MG tablet Take 1 tablet (20 mg total) by mouth daily. (Patient not taking: Reported on 07/04/2017) 30 tablet 0 Not Taking at Unknown time  . famotidine (PEPCID) 20 MG tablet Take 1 tablet (20 mg total) by mouth 2 (two) times daily. (Patient not taking: Reported on 07/04/2017) 60 tablet 0 Not Taking at Unknown time  . FLUoxetine (PROZAC) 20 MG capsule Take 20 mg by mouth daily.  0 07/04/2017 at Unknown time  . hydrOXYzine (ATARAX/VISTARIL) 25 MG tablet Take 1 tablet (25 mg total) by mouth every 6 (six) hours as needed for anxiety. 30 tablet 0 07/04/2017 at Unknown time  . hydrOXYzine (VISTARIL) 25 MG capsule TAKE ONE CAPSULE BY MOUTH EVERY 6 HOURS AS NEEDED  0 Not Taking at Unknown time  . ibuprofen (ADVIL,MOTRIN) 200 MG tablet Take 200 mg by mouth every 8 (eight) hours as needed (pain).   Not Taking at Unknown time  . QUEtiapine (SEROQUEL) 25 MG tablet Take 1 tablet (25 mg total) by mouth 2 (two) times daily. Take 3 tablet (57m total) by mouth at bed time (Patient taking differently: Take 25 mg by mouth at bedtime. ) 150 tablet 0 07/03/2017 at Unknown time  . traZODone (DESYREL) 50 MG tablet Take 1 tablet (50 mg total) by mouth at bedtime as needed for sleep. (Patient not taking: Reported on 07/04/2017) 30 tablet 0 Not Taking at Unknown time    Musculoskeletal: Strength & Muscle Tone: within normal limits Gait & Station: normal Patient leans: N/A  Psychiatric Specialty Exam: Physical Exam  Nursing note and vitals reviewed.   Review of Systems  Constitutional: Negative for  chills and fever.  Respiratory: Negative for cough and shortness of breath.   Cardiovascular: Negative for chest pain.  Gastrointestinal: Negative for abdominal pain, heartburn, nausea and vomiting.  Psychiatric/Behavioral: Positive for depression, hallucinations, substance abuse and suicidal ideas. The patient is nervous/anxious.     Blood pressure (!) 147/75, pulse 93, temperature 98 F (36.7 C), temperature source Oral, resp. rate 18, height '5\' 11"'  (1.803 m), weight 101.6 kg (224 lb).Body mass index is 31.24 kg/m.  General Appearance: Casual and Fairly Groomed  Eye Contact:  Good  Speech:  Clear and Coherent  Volume:  Normal  Mood:  Anxious and Depressed  Affect:  Congruent, Constricted and  Flat  Thought Process:  Coherent and Descriptions of Associations: Loose  Orientation:  Full (Time, Place, and Person)  Thought Content:  Hallucinations: Auditory  Suicidal Thoughts:  Yes.  with intent/plan  Homicidal Thoughts:  No  Memory:  Immediate;   Good Recent;   Good Remote;   Good  Judgement:  Fair  Insight:  Fair  Psychomotor Activity:  Normal  Concentration:  Concentration: Good  Recall:  Good  Fund of Knowledge:  Good  Language:  Good  Akathisia:  No  Handed:    AIMS (if indicated):     Assets:  Armed forces logistics/support/administrative officer Physical Health Resilience Social Support  ADL's:  Intact  Cognition:  WNL  Sleep:  Number of Hours: 6.75    Treatment Plan Summary: Daily contact with patient to assess and evaluate symptoms and progress in treatment and Medication management  Observation Level/Precautions:  15 minute checks  Laboratory:  CBC Chemistry Profile UDS  Psychotherapy:  Encourage participation in groups and therapeutic milieu  Medications:  Change abilify 14m qDay to abilify 139mqDay, continue prozac 2067mDay  Consultations:    Discharge Concerns:    Estimated LOS: 5-7 days  Other:     Physician Treatment Plan for Primary Diagnosis: Schizoaffective disorder  ([place] Term Goal(s): Improvement in symptoms so as ready for discharge  Short Term Goals: Ability to identify triggers associated with substance abuse/mental health issues will improve  Physician Treatment Plan for Secondary Diagnosis: Principal Problem:   Schizoaffective disorder ([place] Problems:   Borderline intellectual functioning  Long Term Goal(s): Improvement in symptoms so as ready for discharge  Short Term Goals: Compliance with prescribed medications will improve  I certify that inpatient services furnished can reasonably be expected to improve the patient's condition.    ChrPennelope BrackenD 11/30/20182:52 PM

## 2017-07-06 NOTE — Progress Notes (Addendum)
Recreation Therapy Notes  Date: 07/06/17 Time: 1000 Location: 500 Hall Dayroom  Group Topic: Leisure Education  Goal Area(s) Addresses:  Patient will identify positive leisure activities.  Patient will identify one positive benefit of participation in leisure activities.   Intervention: AT&TWhite board, dry erase marker, eraser, various words  Activity:  Pictionary.  LRT divided the group into two teams.  Each team was given one minute to draw/guess their picture.  If they did not guess the correct answer, the other team got a chance to still the point.    Education:  Leisure Education, Building control surveyorDischarge Planning  Education Outcome: Acknowledges education/In group clarification offered/Needs additional education  Clinical Observations/Feedback: Pt did not attend group.    Caroll RancherMarjette Lexiana Spindel, LRT/CTRS         Caroll RancherLindsay, Clarissa Laird A 07/06/2017 12:04 PM

## 2017-07-06 NOTE — Tx Team (Signed)
Interdisciplinary Treatment and Diagnostic Plan Update  07/06/2017 Time of Session: 10:57 AM  Marvin Crawford MRN: [id number]  Principal Diagnosis: <principal problem not specified>  Secondary Diagnoses: Active Problems:   Major depressive disorder, recurrent, severe with psychotic features ([place])   Current Medications:  Current Facility-Administered Medications  Medication Dose Route Frequency Provider Last Rate Last Dose  . acetaminophen (TYLENOL) tablet 650 mg  650 mg Oral Q4H PRN Patrecia Pour, NP      . alum & mag hydroxide-simeth (MAALOX/MYLANTA) 200-200-20 MG/5ML suspension 30 mL  30 mL Oral Q6H PRN Patrecia Pour, NP      . ARIPiprazole (ABILIFY) tablet 10 mg  10 mg Oral Daily Patrecia Pour, NP   10 mg at 07/06/17 0855  . FLUoxetine (PROZAC) capsule 20 mg  20 mg Oral Daily Patrecia Pour, NP   20 mg at 07/06/17 2197  . hydrOXYzine (ATARAX/VISTARIL) tablet 25 mg  25 mg Oral Q6H PRN Patrecia Pour, NP      . magnesium hydroxide (MILK OF MAGNESIA) suspension 30 mL  30 mL Oral Daily PRN Patrecia Pour, NP      . ondansetron [facility]) tablet 4 mg  4 mg Oral Q8H PRN Patrecia Pour, NP      . traZODone (DESYREL) tablet 50 mg  50 mg Oral QHS PRN Patrecia Pour, NP      . ziprasidone (GEODON) injection 10 mg  10 mg Intramuscular Q12H PRN Patrecia Pour, NP        PTA Medications: Medications Prior to Admission  Medication Sig Dispense Refill Last Dose  . ARIPiprazole (ABILIFY) 10 MG tablet Take 10 mg by mouth daily.  0 Not Taking at Unknown time  . ARIPiprazole (ABILIFY) 2 MG tablet Take 2 mg by mouth 2 (two) times daily.   07/04/2017 at Unknown time  . ARIPiprazole ER (ABILIFY MAINTENA) 400 MG SRER Inject 400 mg into the muscle every 30 (thirty) days.   06/18/2017 at unknown time  . cholecalciferol (VITAMIN D) 1000 units tablet Take 1,000 Units by mouth daily.   Past Week at Unknown time  . divalproex (DEPAKOTE ER) 250 MG 24 hr tablet Take 250 mg by mouth 2 (two) times daily.   0 07/04/2017 at 0800  . escitalopram (LEXAPRO) 20 MG tablet Take 1 tablet (20 mg total) by mouth daily. (Patient not taking: Reported on 07/04/2017) 30 tablet 0 Not Taking at Unknown time  . famotidine (PEPCID) 20 MG tablet Take 1 tablet (20 mg total) by mouth 2 (two) times daily. (Patient not taking: Reported on 07/04/2017) 60 tablet 0 Not Taking at Unknown time  . FLUoxetine (PROZAC) 20 MG capsule Take 20 mg by mouth daily.  0 07/04/2017 at Unknown time  . hydrOXYzine (ATARAX/VISTARIL) 25 MG tablet Take 1 tablet (25 mg total) by mouth every 6 (six) hours as needed for anxiety. 30 tablet 0 07/04/2017 at Unknown time  . hydrOXYzine (VISTARIL) 25 MG capsule TAKE ONE CAPSULE BY MOUTH EVERY 6 HOURS AS NEEDED  0 Not Taking at Unknown time  . ibuprofen (ADVIL,MOTRIN) 200 MG tablet Take 200 mg by mouth every 8 (eight) hours as needed (pain).   Not Taking at Unknown time  . QUEtiapine (SEROQUEL) 25 MG tablet Take 1 tablet (25 mg total) by mouth 2 (two) times daily. Take 3 tablet (14m total) by mouth at bed time (Patient taking differently: Take 25 mg by mouth at bedtime. ) 150 tablet 0 07/03/2017 at Unknown time  .  traZODone (DESYREL) 50 MG tablet Take 1 tablet (50 mg total) by mouth at bedtime as needed for sleep. (Patient not taking: Reported on 07/04/2017) 30 tablet 0 Not Taking at Unknown time    Patient Stressors: Marital or family conflict Medication change or noncompliance  Patient Strengths: General fund of knowledge Physical Health  Treatment Modalities: Medication Management, Group therapy, Case management,  1 to 1 session with clinician, Psychoeducation, Recreational therapy.   Physician Treatment Plan for Primary Diagnosis: <principal problem not specified> Long Term Goal(s): Improvement in symptoms so as ready for discharge  Short Term Goals:    Medication Management: Evaluate patient's response, side effects, and tolerance of medication regimen.  Therapeutic Interventions: 1 to 1  sessions, Unit Group sessions and Medication administration.  Evaluation of Outcomes: Progressing  Physician Treatment Plan for Secondary Diagnosis: Active Problems:   Major depressive disorder, recurrent, severe with psychotic features (HCC)   Long Term Goal(s): Improvement in symptoms so as ready for discharge  Short Term Goals:    Medication Management: Evaluate patient's response, side effects, and tolerance of medication regimen.  Therapeutic Interventions: 1 to 1 sessions, Unit Group sessions and Medication administration.  Evaluation of Outcomes: Progressing   RN Treatment Plan for Primary Diagnosis: <principal problem not specified> Long Term Goal(s): Knowledge of disease and therapeutic regimen to maintain health will improve  Short Term Goals: Ability to identify and develop effective coping behaviors will improve and Compliance with prescribed medications will improve  Medication Management: RN will administer medications as ordered by provider, will assess and evaluate patient's response and provide education to patient for prescribed medication. RN will report any adverse and/or side effects to prescribing provider.  Therapeutic Interventions: 1 on 1 counseling sessions, Psychoeducation, Medication administration, Evaluate responses to treatment, Monitor vital signs and CBGs as ordered, Perform/monitor CIWA, COWS, AIMS and Fall Risk screenings as ordered, Perform wound care treatments as ordered.  Evaluation of Outcomes: Progressing   LCSW Treatment Plan for Primary Diagnosis: <principal problem not specified> Long Term Goal(s): Safe transition to appropriate next level of care at discharge, Engage patient in therapeutic group addressing interpersonal concerns.  Short Term Goals: Engage patient in aftercare planning with referrals and resources  Therapeutic Interventions: Assess for all discharge needs, 1 to 1 time with Social worker, Explore available resources and  support systems, Assess for adequacy in community support network, Educate family and significant other(s) on suicide prevention, Complete Psychosocial Assessment, Interpersonal group therapy.  Evaluation of Outcomes: Met  Return home, follow up [facility]   Progress in Treatment: Attending groups: Yes Participating in groups: Yes Taking medication as prescribed: Yes Toleration medication: Yes, no side effects reported at this time Family/Significant other contact made: Yes Patient understands diagnosis: No  Limited insight Discussing patient identified problems/goals with staff: Yes Medical problems stabilized or resolved: Yes Denies suicidal/homicidal ideation: Yes Issues/concerns per patient self-inventory: None Other: N/A  New problem(s) identified: None identified at this time.   New Short Term/Long Term Goal(s): None identified at this time.   Discharge Plan or Barriers:   Reason for Continuation of Hospitalization: Disorganization Hallucinations  Medication stabilization Suicidal ideation   Estimated Length of Stay: 12/5  Attendees: Patient: 07/06/2017  10:57 AM  Physician: Christopher Rainville, MD 07/06/2017  10:57 AM  Nursing: Elizabeth Awofadeju, RN 07/06/2017  10:57 AM  RN Care Manager: Jennifer Clark, RN 07/06/2017  10:57 AM  Social Worker: Rod  07/06/2017  10:57 AM  Recreational Therapist: Marjette Lindsey 07/06/2017    10:57 AM  Other: Norberto Sorenson 07/06/2017  10:57 AM  Other:  07/06/2017  10:57 AM    Scribe for Treatment Team:  Roque Lias LCSW 07/06/2017 10:57 AM

## 2017-07-06 NOTE — Progress Notes (Signed)
Recreation Therapy Notes  Patient admitted to unit 11.29.28 Due to admission within last year, no new assessment conducted at this time. Last assessment conducted 10.3.18. Patient reports no changes in stressors from previous admission. Patient reports catalyst for admission was depression and anxiety.  Patient denies SI but rated HI 1 out of 10. Patient reports goal of finding the right medicine.  Information found below from assessment conducted 10.03.2018  Coping Skills:  Isolate, Arguments, Substance Abuse, Avoidance, Self-Injury, Exercise, Talking, Music, Sports  Personal Challenges:  Anger, Communication, Concentration, Decision-Making, Expressing Yourself, Problem-Solving, Relationships, Self-Esteem/Confidence, Social Interaction, Stress Management, Substance Abuse, Time Management, Trusting Others, Work International aid/development workererformance  Leisure Interests:  Listen to music, work, watch tv     Marvin Crawford, LRT/CTRS     Marvin Crawford, Marvin Crawford 07/06/2017 2:54 PM

## 2017-07-06 NOTE — Progress Notes (Signed)
Marvin Crawford is a 21 year old male being admitted voluntarily to [facility] from [facility].  He came in via the police.  His mother called police after Marvin Crawford became agitated, punched the wall and left.  Police picked him up in the neighborhood and he agreed to come to the hospital.  He reported depression, anxiety, worry and admitted to suicidal ideation.  He reported plan to use fireplace gas at home.  He has history of visual hallucinations.  He has history suicide attempts in the past and multiple psychiatric hospitalizations.  He denies any medical history.  He is diagnosed with Major Depressive Disorder and Schizoaffective Disorder.  [facility], he was pleasant and cooperative.  Minimal with conversation.  He denies any SI/HI or A/V hallucinations.  He denies any history of suicide attempts in the past.  He stated the got mad at himself at home, punched wall and left.  "I came in with the police to the hospital."  He denies any depression at this time.  He voiced no complaints.  He stated that he just wants to go home.  He denies any pain or discomfort and appears to be in no physical distress.  Admission paperwork completed and signed.  Belongings searched and secured in locker # 55.  Skin assessment completed and no skin issues noted.  Q 15 minute checks initiated for safety.  We will monitor the progress towards his goals.

## 2017-07-06 NOTE — [facility] Suicide Risk Assessment (Signed)
[facility] INPATIENT:  Family/Significant Other Suicide Prevention Education  Suicide Prevention Education:  Education Completed; Marvin Crawford, [phone]  has been identified by the patient as the family member/significant other with whom the patient will be residing, and identified as the person(s) who will aid the patient in the event of a mental health crisis (suicidal ideations/suicide attempt).  With written consent from the patient, the family member/significant other has been provided the following suicide prevention education, prior to the and/or following the discharge of the patient.  The suicide prevention education provided includes the following:  Suicide risk factors  Suicide prevention and interventions  National Suicide Hotline telephone number  [facility] assessment telephone number  [facility] Assistance 911  [facility] and/or Residential Mobile Crisis Unit telephone number  Request made of family/significant other to:  Remove weapons (e.g., guns, rifles, knives), all items previously/currently identified as safety concern.    Remove drugs/medications (over-the-counter, prescriptions, illicit drugs), all items previously/currently identified as a safety concern.  The family member/significant other verbalizes understanding of the suicide prevention education information provided.  The family member/significant other agrees to remove the items of safety concern listed above. He is seeing [facility] for medication management and CST.  They were considering sending him to [facility] since he is hospitalized again, but she likes the idea [place] [facility], so we'll go with that.  She has he has been medication compliant at night when she is home, but she is unsure about the day.   Marvin Crawford 07/06/2017, 10:40 AM

## 2017-07-06 NOTE — Progress Notes (Signed)
Did not attend group 

## 2017-07-06 NOTE — Tx Team (Signed)
Initial Treatment Plan 07/06/2017 12:10 AM Marvin Crawford Arias ZOX:[id number]    PATIENT STRESSORS: Marital or family conflict Medication change or noncompliance   PATIENT STRENGTHS: General fund of knowledge Physical Health   PATIENT IDENTIFIED PROBLEMS: Depression  Suicidal ideation  Aggression / agitation  "Get out of here"  "I don't know"             DISCHARGE CRITERIA:  Improved stabilization in mood, thinking, and/or behavior Verbal commitment to aftercare and medication compliance  PRELIMINARY DISCHARGE PLAN: Outpatient therapy Medication management  PATIENT/FAMILY INVOLVEMENT: This treatment plan has been presented to and reviewed with the patient, Marvin Crawford Saenz.  The patient and family have been given the opportunity to ask questions and make suggestions.  Levin BaconHeather V Steen Bisig, RN 07/06/2017, 12:10 AM

## 2017-07-07 DIAGNOSIS — G4700 Insomnia, unspecified: Secondary | ICD-10-CM

## 2017-07-07 DIAGNOSIS — F39 Unspecified mood [affective] disorder: Secondary | ICD-10-CM

## 2017-07-07 DIAGNOSIS — F17210 Nicotine dependence, cigarettes, uncomplicated: Secondary | ICD-10-CM

## 2017-07-07 DIAGNOSIS — F250 Schizoaffective disorder, bipolar type: Secondary | ICD-10-CM

## 2017-07-07 MED ORDER — POTASSIUM CHLORIDE CRYS ER 20 MEQ PO TBCR
20.0000 meq | EXTENDED_RELEASE_TABLET | Freq: Two times a day (BID) | ORAL | Status: AC
  Administered 2017-07-07 – 2017-07-09 (×3): 20 meq via ORAL
  Filled 2017-07-07 (×6): qty 1

## 2017-07-07 MED ORDER — TRAZODONE HCL 100 MG PO TABS
100.0000 mg | ORAL_TABLET | Freq: Every evening | ORAL | Status: DC | PRN
  Administered 2017-07-07 – 2017-07-09 (×4): 100 mg via ORAL
  Filled 2017-07-07 (×5): qty 1

## 2017-07-07 NOTE — Progress Notes (Signed)
[facility] Progress [facility]  07/07/2017 4:36 PM Marvin Moodsaiah C Godek  MRN:  [id number] Subjective:  Marvin Crawford reports " I got into with my mother and I started punching the walls."  Objective: Marvin Crawford is awake, alert and oriented. Seen resting in bedroom.  Denies suicidal or homicidal ideation during this assessment . Denies auditory or visual hallucination and does not appear to be responding to internal stimuli. Patient present with a flat and guarded affect.  Reports taken medications as prescribed  and is tolerating medication well.  Reports he has a hard time controlling his anger.   Support, encouragement and reassurance was provided.   Principal Problem: Schizoaffective disorder (HCC) Diagnosis:   Patient Active Problem List   Diagnosis Date Noted  . Schizoaffective disorder (HCC) [F25.9] 07/06/2017  . 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 Stuttering [F80.81] 08/09/2015  . Cannabis use disorder, moderate, dependence (HCC) [F12.20] 08/09/2015  . MDD (major depressive disorder), recurrent, severe, with psychosis (HCC) [F33.3] 02/16/2013   Total Time spent with patient: 30 minutes  Past Psychiatric History:   Past Medical History:  Past Medical History:  Diagnosis Date  . ADD (attention deficit disorder)   . Anxiety   . Depression   . Environmental allergies    History reviewed. No pertinent surgical history. Family History:  Family History  Problem Relation Age of Onset  . Hypertension Mother   . Hypertension Father   . Mental illness Neg Hx    Family Psychiatric  History:  Social History:  Social History   Substance and Sexual Activity  Alcohol Use No  . Alcohol/week: 0.0 oz     Social History   Substance and Sexual Activity  Drug Use Yes  . Types: Other-see comments, Marijuana   Comment: oil smoked in pipe calls "DAB", friends buy for him    Social  History   Socioeconomic History  . Marital status: Single    Spouse name: None  . Number of children: None  . Years of education: None  . Highest education level: None  Social Needs  . Financial resource strain: None  . Food insecurity - worry: None  . Food insecurity - inability: None  . Transportation needs - medical: None  . Transportation needs - non-medical: None  Occupational History  . None  Tobacco Use  . Smoking status: Current Every Day Smoker    Packs/day: 2.00  . Smokeless tobacco: Never Used  Substance and Sexual Activity  . Alcohol use: No    Alcohol/week: 0.0 oz  . Drug use: Yes    Types: Other-see comments, Marijuana    Comment: oil smoked in pipe calls "DAB", friends buy for him  . Sexual activity: Yes    Birth control/protection: Condom  Other Topics Concern  . None  Social History Narrative  . None   Additional Social History:                         Sleep: Fair  Appetite:  Fair  Current Medications: Current Facility-Administered Medications  Medication Dose Route Frequency Provider Last Rate Last Dose  . acetaminophen (TYLENOL) tablet 650 mg  650 mg Oral Q4H PRN Charm RingsLord, Jamison Y, NP      . alum & mag hydroxide-simeth (MAALOX/MYLANTA) 200-200-20 MG/5ML suspension 30 mL  30 mL Oral Q6H PRN Charm RingsLord, Jamison Y, NP      . ARIPiprazole (ABILIFY) tablet  15 mg  15 mg Oral Daily Micheal Likensainville, Christopher T, MD   15 mg at 07/07/17 0900  . FLUoxetine (PROZAC) capsule 20 mg  20 mg Oral Daily Charm RingsLord, Jamison Y, NP   20 mg at 07/07/17 0900  . hydrOXYzine (ATARAX/VISTARIL) tablet 25 mg  25 mg Oral Q6H PRN Charm RingsLord, Jamison Y, NP   25 mg at 07/06/17 2052  . magnesium hydroxide (MILK OF MAGNESIA) suspension 30 mL  30 mL Oral Daily PRN Charm RingsLord, Jamison Y, NP      . ondansetron [facility](ZOFRAN) tablet 4 mg  4 mg Oral Q8H PRN Charm RingsLord, Jamison Y, NP      . traZODone (DESYREL) tablet 50 mg  50 mg Oral QHS PRN Charm RingsLord, Jamison Y, NP   50 mg at 07/06/17 2053  . ziprasidone (GEODON)  injection 10 mg  10 mg Intramuscular Q12H PRN Charm RingsLord, Jamison Y, NP        Lab Results: No results found for this or any previous visit (from the past 48 hour(s)).  Blood Alcohol level:  Lab Results  Component Value Date   ETH <10 07/04/2017   ETH <10 05/30/2017    Metabolic Disorder Labs: Lab Results  Component Value Date   HGBA1C 5.2 05/10/2017   MPG 102.54 05/10/2017   MPG 117 09/11/2015   Lab Results  Component Value Date   PROLACTIN 6.5 05/10/2017   PROLACTIN 2.1 (L) 09/11/2015   Lab Results  Component Value Date   CHOL 125 05/10/2017   TRIG 54 05/10/2017   HDL 35 (L) 05/10/2017   CHOLHDL 3.6 05/10/2017   VLDL 11 05/10/2017   LDLCALC 79 05/10/2017   LDLCALC 79 09/11/2015    Physical Findings: AIMS: Facial and Oral Movements Muscles of Facial Expression: None, normal Lips and Perioral Area: None, normal Jaw: None, normal Tongue: None, normal,Extremity Movements Upper (arms, wrists, hands, fingers): None, normal Lower (legs, knees, ankles, toes): None, normal, Trunk Movements Neck, shoulders, hips: None, normal, Overall Severity Severity of abnormal movements (highest score from questions above): None, normal Incapacitation due to abnormal movements: None, normal Patient's awareness of abnormal movements (rate only patient's report): No Awareness, Dental Status Current problems with teeth and/or dentures?: No Does patient usually wear dentures?: No  CIWA:    COWS:     Musculoskeletal: Strength & Muscle Tone: within normal limits Gait & Station: normal Patient leans: N/A  Psychiatric Specialty Exam: Physical Exam  Vitals reviewed. Constitutional: He appears well-developed.  Cardiovascular: Normal rate.  Neurological: He is alert.  Psychiatric: He has a normal mood and affect. His behavior is normal.    Review of Systems  Psychiatric/Behavioral: Positive for depression. The patient is nervous/anxious.     Blood pressure (!) 147/75, pulse 93,  temperature 98 F (36.7 C), temperature source Oral, resp. rate 18, height 5\' 11"  (1.803 m), weight 101.6 kg (224 lb).Body mass index is 31.24 kg/m.  General Appearance: Casual and Guarded  Eye Contact:  Fair  Speech:  Clear and Coherent  Volume:  Decreased  Mood:  Depressed and Dysphoric  Affect:  Depressed and Flat  Thought Process:  Coherent  Orientation:  Full (Time, Place, and Person)  Thought Content:  Hallucinations: None  Suicidal Thoughts:  No  Homicidal Thoughts:  No  Memory:  Immediate;   Fair Recent;   Fair Remote;   Fair  Judgement:  Fair  Insight:  Present  Psychomotor Activity:  Normal  Concentration:  Concentration: Fair  Recall:  [employer]  Fund of Knowledge:  Fair  Language:  Fair  Akathisia:  No  Handed:  Right  AIMS (if indicated):     Assets:  Communication Skills Desire for Improvement Resilience Social Support  ADL's:  Intact  Cognition:  WNL  Sleep:  Number of Hours: 3.25    Treatment Plan Summary: Daily contact with patient to assess and evaluate symptoms and progress in treatment and Medication management   Continue with current treatment plan on 07/07/2017 except where noted   Continue with Abilify 15 mg and Prozac 20 mg  for mood stabilization. Continue with Trazodone 50 mg for insomnia  Will continue to monitor vitals ,medication compliance and treatment side effects while patient is here.  Reviewed labs: Potassium 2.9 Patient to start  K- Dur, Klor 20 MEq X 3 doses with repeat lab on 07/10/2017 CSW will start working on disposition.  Patient to participate in therapeutic milieu   Oneta Rack, NP 07/07/2017, 4:36 PM

## 2017-07-07 NOTE — Progress Notes (Signed)
D: Pt SI/ AVH- contracts for safety. Pt is pleasant and cooperative. Pt childlike and has minimal interaction, but is visible on the unit this evening. Pt stated he wanted his Seroquel, pt was asked to talk with the doctor tomorrow about getting that.   A: Pt was offered support and encouragement. Pt was given scheduled medications. Pt was encourage to attend groups. Q 15 minute checks were done for safety.   R: safety maintained on unit.

## 2017-07-07 NOTE — Progress Notes (Addendum)
D: Patient's self inventory sheet: patient has good sleep, no sleep medication.good  Appetite, high energy level, good concentration. Rated depression 5/10, hopeless 5/10, anxiety 5/10. SI/HI/AVH:denies SI, denies AH and VH but does appear to be responding. Physical complaints are denied. Goal is to take right medicine. Plans to work on going group and eat right.   A: Medications administered, assessed medication knowledge and education given on medication regimen.  Emotional support and encouragement given patient. R: Denies SI and HI , contracts for safety. Safety maintained with 15 minute checks.

## 2017-07-07 NOTE — [facility] Group Notes (Signed)
[facility] Group Notes: (Clinical Social Work)   07/07/2017      Type of Therapy:  Group Therapy   Participation Level:  Did Not Attend despite MHT prompting - he came in and out of group several times, but did not participate, left quickly, and spoke to himself laughing while present.   Marvin MantleMareida Grossman-Orr, LCSW 07/07/2017, 12:28 PM

## 2017-07-07 NOTE — Progress Notes (Signed)
At the beginning of the shift, pt was up to the NS many times asking questions of the RNs.  He also brought his copy of the voluntary papers up to show writer that he could leave saying that he had been here 72 hours.  Writer explained to him that the time he spent in the ED did not count.  He was appropriate when talking to writer, but was not satisfied with answers he was given.  Pt was encouraged to discuss his concerns with the doctor in the morning.  His mother also came to visit and pt gave permission for her to have his code.  Pt has been appropriate.  He denies SI/HI/AVH.  He took Vistaril and Trazodone after having the other RN on the hall check to see if it was the correct medication.  Support and encouragement offered.  Discharge plans are in process.  Safety maintained with q15 minute checks.

## 2017-07-07 NOTE — Plan of Care (Signed)
Pt taking his medications and is safe on the unit at this time

## 2017-07-07 NOTE — Progress Notes (Signed)
Adult Psychoeducational Group Note  Date:  07/07/2017 Time:  9:55 PM  Group Topic/Focus:  Wrap-Up Group:   The focus of this group is to help patients review their daily goal of treatment and discuss progress on daily workbooks.  Participation Level:  Did Not Attend  Participation Quality:  Did not attend  Affect:  Did not attend  Cognitive:  Did not attend  Insight: None  Engagement in Group:  Did not attend  Modes of Intervention:  Did not attend  Additional Comments:  Pt did not attend evening wrap up group tonight.  Felipa FurnaceChristopher  Avari Gelles 07/07/2017, 9:55 PM

## 2017-07-08 MED ORDER — HYDROXYZINE HCL 50 MG PO TABS
50.0000 mg | ORAL_TABLET | Freq: Four times a day (QID) | ORAL | Status: DC | PRN
  Administered 2017-07-08 – 2017-07-09 (×2): 50 mg via ORAL
  Filled 2017-07-08 (×2): qty 1

## 2017-07-08 NOTE — Plan of Care (Signed)
Pt been safe on the unit denies SI/ HI/ AVH at this time.

## 2017-07-08 NOTE — Progress Notes (Signed)
[facility] MD Progress Note  07/08/2017 11:41 AM Marvin Crawford  MRN:  [id number] Subjective:  Rubin reports "I am feeling good today" noted that patient was requesting Seroquel for his anxiety. Discussed increasing Atarax to help with symptoms.    Objective: [name] seen attending group session. Reports a good phone call to his mother on yesterday. States she has plans to visit on tonight and will bring him additional clothing. Continues to deny suicidal or homicidal ideation. Denies auditory or visual hallucination and does not appear to be responding to internal stimuli.  Reports taken medications as prescribed  and is tolerating medication well, see MAR for medication adjustments. Support, encouragement and reassurance was provided.   Principal Problem: Schizoaffective disorder (HCC) Diagnosis:   Patient Active Problem List   Diagnosis Date Noted  . Schizoaffective disorder (HCC) [F25.9] 07/06/2017  . 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 Stuttering [F80.81] 08/09/2015  . Cannabis use disorder, moderate, dependence (HCC) [F12.20] 08/09/2015  . MDD (major depressive disorder), recurrent, severe, with psychosis (HCC) [F33.3] 02/16/2013   Total Time spent with patient: 30 minutes  Past Psychiatric History:   Past Medical History:  Past Medical History:  Diagnosis Date  . ADD (attention deficit disorder)   . Anxiety   . Depression   . Environmental allergies    History reviewed. No pertinent surgical history. Family History:  Family History  Problem Relation Age of Onset  . Hypertension Mother   . Hypertension Father   . Mental illness Neg Hx    Family Psychiatric  History:  Social History:  Social History   Substance and Sexual Activity  Alcohol Use No  . Alcohol/week: 0.0 oz     Social History   Substance and Sexual Activity  Drug Use Yes  . Types:  Other-see comments, Marijuana   Comment: oil smoked in pipe calls "DAB", friends buy for him    Social History   Socioeconomic History  . Marital status: Single    Spouse name: None  . Number of children: None  . Years of education: None  . Highest education level: None  Social Needs  . Financial resource strain: None  . Food insecurity - worry: None  . Food insecurity - inability: None  . Transportation needs - medical: None  . Transportation needs - non-medical: None  Occupational History  . None  Tobacco Use  . Smoking status: Current Every Day Smoker    Packs/day: 2.00  . Smokeless tobacco: Never Used  Substance and Sexual Activity  . Alcohol use: No    Alcohol/week: 0.0 oz  . Drug use: Yes    Types: Other-see comments, Marijuana    Comment: oil smoked in pipe calls "DAB", friends buy for him  . Sexual activity: Yes    Birth control/protection: Condom  Other Topics Concern  . None  Social History Narrative  . None   Additional Social History:                         Sleep: Fair  Appetite:  Fair  Current Medications: Current Facility-Administered Medications  Medication Dose Route Frequency Provider Last Rate Last Dose  . acetaminophen (TYLENOL) tablet 650 mg  650 mg Oral Q4H PRN Charm Rings, NP      . alum & mag hydroxide-simeth (MAALOX/MYLANTA) 200-200-20 MG/5ML suspension 30 mL  30 mL Oral Q6H PRN Nanine Means  Y, NP      . ARIPiprazole (ABILIFY) tablet 15 mg  15 mg Oral Daily Micheal Likensainville, Christopher T, MD   15 mg at 07/07/17 0900  . FLUoxetine (PROZAC) capsule 20 mg  20 mg Oral Daily Charm RingsLord, Jamison Y, NP   20 mg at 07/07/17 0900  . hydrOXYzine (ATARAX/VISTARIL) tablet 50 mg  50 mg Oral Q6H PRN Oneta RackLewis, Bobbi Yount N, NP      . magnesium hydroxide (MILK OF MAGNESIA) suspension 30 mL  30 mL Oral Daily PRN Charm RingsLord, Jamison Y, NP      . ondansetron [facility](ZOFRAN) tablet 4 mg  4 mg Oral Q8H PRN Charm RingsLord, Jamison Y, NP      . potassium chloride SA (K-DUR,KLOR-CON) CR  tablet 20 mEq  20 mEq Oral BID Oneta RackLewis, Elijha Dedman N, NP   20 mEq at 07/07/17 1723  . traZODone (DESYREL) tablet 100 mg  100 mg Oral QHS PRN,MR X 1 Nira ConnBerry, Jason A, NP   100 mg at 07/07/17 2209  . ziprasidone (GEODON) injection 10 mg  10 mg Intramuscular Q12H PRN Charm RingsLord, Jamison Y, NP        Lab Results: No results found for this or any previous visit (from the past 48 hour(s)).  Blood Alcohol level:  Lab Results  Component Value Date   ETH <10 07/04/2017   ETH <10 05/30/2017    Metabolic Disorder Labs: Lab Results  Component Value Date   HGBA1C 5.2 05/10/2017   MPG 102.54 05/10/2017   MPG 117 09/11/2015   Lab Results  Component Value Date   PROLACTIN 6.5 05/10/2017   PROLACTIN 2.1 (L) 09/11/2015   Lab Results  Component Value Date   CHOL 125 05/10/2017   TRIG 54 05/10/2017   HDL 35 (L) 05/10/2017   CHOLHDL 3.6 05/10/2017   VLDL 11 05/10/2017   LDLCALC 79 05/10/2017   LDLCALC 79 09/11/2015    Physical Findings: AIMS: Facial and Oral Movements Muscles of Facial Expression: None, normal Lips and Perioral Area: None, normal Jaw: None, normal Tongue: None, normal,Extremity Movements Upper (arms, wrists, hands, fingers): None, normal Lower (legs, knees, ankles, toes): None, normal, Trunk Movements Neck, shoulders, hips: None, normal, Overall Severity Severity of abnormal movements (highest score from questions above): None, normal Incapacitation due to abnormal movements: None, normal Patient's awareness of abnormal movements (rate only patient's report): No Awareness, Dental Status Current problems with teeth and/or dentures?: No Does patient usually wear dentures?: No  CIWA:    COWS:     Musculoskeletal: Strength & Muscle Tone: within normal limits Gait & Station: normal Patient leans: N/A  Psychiatric Specialty Exam: Physical Exam  Vitals reviewed. Constitutional: He appears well-developed.  Cardiovascular: Normal rate.  Neurological: He is alert.  Psychiatric:  He has a normal mood and affect. His behavior is normal.    Review of Systems  Psychiatric/Behavioral: Positive for depression. The patient is nervous/anxious.     Blood pressure 128/85, pulse 92, temperature 97.9 F (36.6 C), temperature source Oral, resp. rate 18, height 5\' 11"  (1.803 m), weight 101.6 kg (224 lb).Body mass index is 31.24 kg/m.  General Appearance: Casual, paper scrubs   Eye Contact:  Fair  Speech:  Clear and Coherent  Volume:  Decreased  Mood:  Depressed and Dysphoric  Affect:  Depressed and Flat  Thought Process:  Coherent  Orientation:  Full (Time, Place, and Person)  Thought Content:  Hallucinations: None  Suicidal Thoughts:  No  Homicidal Thoughts:  No  Memory:  Immediate;   Fair Recent;  Fair Remote;   Fair  Judgement:  Fair  Insight:  Present  Psychomotor Activity:  Normal  Concentration:  Concentration: Fair  Recall:  [employer]Fair  Fund of Knowledge:  Fair  Language:  Fair  Akathisia:  No  Handed:  Right  AIMS (if indicated):     Assets:  Communication Skills Desire for Improvement Resilience Social Support  ADL's:  Intact  Cognition:  WNL noted mild intellectual functioning   Sleep:  Number of Hours: 6.75    Treatment Plan Summary: Daily contact with patient to assess and evaluate symptoms and progress in treatment and Medication management   Continue with current treatment plan on 07/08/2017 except where noted   Continue with Abilify 15 mg and Prozac 20 mg  for mood stabilization. Increased Atarax 25 mg to 50 mg PO PRN for anxiety  Continue with Trazodone 50 mg for insomnia  Reviewed labs: Potassium 2.9 Patient to continue  K- Dur, Klor 20 MEq X 3 doses with repeat lab on 07/10/2017  Will continue to monitor vitals ,medication compliance and treatment side effects while patient is here.   CSW will start working on disposition.  Patient to participate in therapeutic milieu   Oneta Rackanika N Daryon Remmert, NP 07/08/2017, 11:41 AM

## 2017-07-08 NOTE — [facility] Group Notes (Signed)
[facility] LCSW Group Therapy Note  Date/Time:  07/08/2017  11:00AM-12:00PM  Type of Therapy and Topic:  Group Therapy:  Music and Mood  Participation Level:  Minimal   Description of Group: In this process group, members listened to a variety of genres of music and identified that different types of music evoke different responses.  Patients were encouraged to identify music that was soothing for them and music that was energizing for them.  Patients discussed how this knowledge can help with wellness and recovery in various ways including managing depression and anxiety as well as encouraging healthy sleep habits.    Therapeutic Goals: 1. Patients will explore the impact of different varieties of music on mood 2. Patients will verbalize the thoughts they have when listening to different types of music 3. Patients will identify music that is soothing to them as well as music that is energizing to them 4. Patients will discuss how to use this knowledge to assist in maintaining wellness and recovery 5. Patients will explore the use of music as a coping skill  Summary of Patient Progress:  At the beginning of group, patient expressed he was feeling upset, and he smiled a lot but ultimately left group.  Therapeutic Modalities: Solution Focused Brief Therapy Motivational Interviewing Activity   Ambrose MantleMareida Grossman-Orr, LCSW 07/08/2017 8:24 AM

## 2017-07-08 NOTE — [facility] Group Notes (Signed)
[facility] Group Notes:  (Nursing)  Date:  07/08/2017  Time:  1:30 PM Type of Therapy:  Nurse Education  Participation Level:  Minimal  Participation Quality:  Inattentive  Affect:  Flat  Cognitive:  Disorganized  Insight:  Limited  Engagement in Group:  Limited  Modes of Intervention:  Discussion  Summary of Progress/Problems:  Patient answered one question in the beginning of group-left room and returned. Shela NevinValerie S Shivali Quackenbush 07/08/2017, 4:03 PM

## 2017-07-08 NOTE — Progress Notes (Signed)
D: Pt denies SI/HI/AVH. Pt is pleasant and cooperative. Pt stated he was feeling better  A: Pt was offered support and encouragement. Pt was given scheduled medications. Pt was encourage to attend groups. Q 15 minute checks were done for safety.   R:Pt attends groups and interacts well with peers and staff. Pt is taking medication. Pt has no complaints.Pt receptive to treatment and safety maintained on unit.

## 2017-07-08 NOTE — Progress Notes (Signed)
Present in milieu with little interaction, childlike presentation. Needs encouragement to go to cafeteria to eat regular meals, instead wants to stay on unit and eat snacks. Attended group but wandered in and out and was not able to stay on task and participate well. Talked about having made mistakes with his mother.

## 2017-07-09 NOTE — Progress Notes (Signed)
Recreation Therapy Notes  Date: 07/09/17 Time: 1000 Location: 500 Hall Dayroom  Group Topic: Coping Skills  Goal Area(s) Addresses:  Patient will be able to identify positive coping skills. Patient will be able to identify the importance of using coping skills. Patient will be able to identify the benefits of using coping skills post d/c.  Intervention: Mind map, pencils, white board, marker, eraser  Activity: Mind map.  Patients were given a blank mind map.  LRT and patients filled in the first eight boxes together with anxiety, mourning a loss, depression, stop smoking, not being able to sleep, anger, loneliness and pain.  Individually, patients were to identify at least three coping skills for each situation identified.  Group would come back together and LRT would right the coping skills identified on the board.  Education: PharmacologistCoping Skills, Building control surveyorDischarge Planning.   Education Outcome: Acknowledges understanding/In group clarification offered/Needs additional education.   Clinical Observations/Feedback: Pt did not attend group.     Caroll RancherMarjette Harolyn Cocker, LRT/CTRS       Caroll RancherLindsay, Meribeth Vitug A 07/09/2017 12:16 PM

## 2017-07-09 NOTE — Progress Notes (Signed)
DAR NOTE: Patient is pleasant and cooperative.  Denies pain, auditory and visual hallucinations.  Reports suicidal thoughts on self inventory form but verbally contracts for safety.  Described energy level as normal and concentration as good.  Rates depression at 5, hopelessness at 5, and anxiety at 5.  Maintained on routine safety checks.  Medications given as prescribed.  Support and encouragement offered as needed.  Patient is withdrawn and isolative to room.  Offered no complaint.

## 2017-07-09 NOTE — Progress Notes (Signed)
[facility]  07/09/2017 2:27 PM Marrianne Moodsaiah C Friske  MRN:  [id number]   Objective: Marrianne MoodIsaiah C Mcginn was evaluated by MD and NP. Denies any concerns today. MD discussed possible discharge on 07/10/2017 patient is agreeable with plan. Discussed patient to keep all follow-up appointment and consider a day program after discharge. [name] continues to  deny suicidal or homicidal ideation. Denies auditory or visual hallucination. Patient to continue medication as order. Reports a good appetite and reports he is resting well. Support, encouragement and reassurance was provided.   Principal Problem: Schizoaffective disorder (HCC) Diagnosis:   Patient Active Problem List   Diagnosis Date Noted  . Schizoaffective disorder (HCC) [F25.9] 07/06/2017  . 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 Stuttering [F80.81] 08/09/2015  . Cannabis use disorder, moderate, dependence (HCC) [F12.20] 08/09/2015  . MDD (major depressive disorder), recurrent, severe, with psychosis (HCC) [F33.3] 02/16/2013   Total Time spent with patient: 30 minutes  Past Psychiatric History:   Past Medical History:  Past Medical History:  Diagnosis Date  . ADD (attention deficit disorder)   . Anxiety   . Depression   . Environmental allergies    History reviewed. No pertinent surgical history. Family History:  Family History  Problem Relation Age of Onset  . Hypertension Mother   . Hypertension Father   . Mental illness Neg Hx    Family Psychiatric  History:  Social History:  Social History   Substance and Sexual Activity  Alcohol Use No  . Alcohol/week: 0.0 oz     Social History   Substance and Sexual Activity  Drug Use Yes  . Types: Other-see comments, Marijuana   Comment: oil smoked in pipe calls "DAB", friends buy for him    Social History   Socioeconomic History  . Marital status: Single     Spouse name: None  . Number of children: None  . Years of education: None  . Highest education level: None  Social Needs  . Financial resource strain: None  . Food insecurity - worry: None  . Food insecurity - inability: None  . Transportation needs - medical: None  . Transportation needs - non-medical: None  Occupational History  . None  Tobacco Use  . Smoking status: Current Every Day Smoker    Packs/day: 2.00  . Smokeless tobacco: Never Used  Substance and Sexual Activity  . Alcohol use: No    Alcohol/week: 0.0 oz  . Drug use: Yes    Types: Other-see comments, Marijuana    Comment: oil smoked in pipe calls "DAB", friends buy for him  . Sexual activity: Yes    Birth control/protection: Condom  Other Topics Concern  . None  Social History Narrative  . None   Additional Social History:                         Sleep: Fair  Appetite:  Fair  Current Medications: Current Facility-Administered Medications  Medication Dose Route Frequency Provider Last Rate Last Dose  . acetaminophen (TYLENOL) tablet 650 mg  650 mg Oral Q4H PRN Charm RingsLord, Jamison Y, NP      . alum & mag hydroxide-simeth (MAALOX/MYLANTA) 200-200-20 MG/5ML suspension 30 mL  30 mL Oral Q6H PRN Charm RingsLord, Jamison Y, NP      . ARIPiprazole (ABILIFY) tablet 15 mg  15 mg Oral Daily Micheal Likensainville, Christopher T, MD   15 mg at 07/09/17  1610  . FLUoxetine (PROZAC) capsule 20 mg  20 mg Oral Daily Charm Rings, NP   20 mg at 07/09/17 0816  . hydrOXYzine (ATARAX/VISTARIL) tablet 50 mg  50 mg Oral Q6H PRN Oneta Rack, NP   50 mg at 07/08/17 2110  . magnesium hydroxide (MILK OF MAGNESIA) suspension 30 mL  30 mL Oral Daily PRN Charm Rings, NP   30 mL at 07/08/17 1932  . ondansetron (ZOFRAN) tablet 4 mg  4 mg Oral Q8H PRN Charm Rings, NP      . potassium chloride SA (K-DUR,KLOR-CON) CR tablet 20 mEq  20 mEq Oral BID Oneta Rack, NP   20 mEq at 07/09/17 0816  . traZODone (DESYREL) tablet 100 mg  100 mg Oral QHS  PRN,MR X 1 Nira Conn A, NP   100 mg at 07/08/17 2110  . ziprasidone (GEODON) injection 10 mg  10 mg Intramuscular Q12H PRN Charm Rings, NP        Lab Results: No results found for this or any previous visit (from the past 48 hour(s)).  Blood Alcohol level:  Lab Results  Component Value Date   ETH <10 07/04/2017   ETH <10 05/30/2017    Metabolic Disorder Labs: Lab Results  Component Value Date   HGBA1C 5.2 05/10/2017   MPG 102.54 05/10/2017   MPG 117 09/11/2015   Lab Results  Component Value Date   PROLACTIN 6.5 05/10/2017   PROLACTIN 2.1 (L) 09/11/2015   Lab Results  Component Value Date   CHOL 125 05/10/2017   TRIG 54 05/10/2017   HDL 35 (L) 05/10/2017   CHOLHDL 3.6 05/10/2017   VLDL 11 05/10/2017   LDLCALC 79 05/10/2017   LDLCALC 79 09/11/2015    Physical Findings: AIMS: Facial and Oral Movements Muscles of Facial Expression: None, normal Lips and Perioral Area: None, normal Jaw: None, normal Tongue: None, normal,Extremity Movements Upper (arms, wrists, hands, fingers): None, normal Lower (legs, knees, ankles, toes): None, normal, Trunk Movements Neck, shoulders, hips: None, normal, Overall Severity Severity of abnormal movements (highest score from questions above): None, normal Incapacitation due to abnormal movements: None, normal Patient's awareness of abnormal movements (rate only patient's report): No Awareness, Dental Status Current problems with teeth and/or dentures?: No Does patient usually wear dentures?: No  CIWA:    COWS:     Musculoskeletal: Strength & Muscle Tone: within normal limits Gait & Station: normal Patient leans: N/A  Psychiatric Specialty Exam: Physical Exam  Vitals reviewed. Constitutional: He appears well-developed.  HENT:  Head: Normocephalic.  Cardiovascular: Normal rate.  Neurological: He is alert.  Psychiatric: He has a normal mood and affect. His behavior is normal.    Review of Systems   Psychiatric/Behavioral: Positive for depression. Negative for hallucinations and suicidal ideas. The patient is nervous/anxious.     Blood pressure 113/62, pulse 71, temperature 97.9 F (36.6 C), temperature source Oral, resp. rate 16, height 5\' 11"  (1.803 m), weight 101.6 kg (224 lb).Body mass index is 31.24 kg/m.  General Appearance: Casual and Fairly Groomed, pleasant cooperative and smiling   Eye Contact:  Fair  Speech:  Clear and Coherent  Volume:  Decreased  Mood:  Depressed and Dysphoric  Affect:  Depressed and Flat  Thought Process:  Coherent  Orientation:  Full (Time, Place, and Person)  Thought Content:  Hallucinations: None  Suicidal Thoughts:  No  Homicidal Thoughts:  No  Memory:  Immediate;   Fair Recent;   Fair Remote;  Fair  Judgement:  Fair  Insight:  Present  Psychomotor Activity:  Normal  Concentration:  Concentration: Fair  Recall:  FiservFair  Fund of Knowledge:  Fair  Language:  Fair  Akathisia:  No  Handed:  Right  AIMS (if indicated):     Assets:  Communication Skills Desire for Improvement Resilience Social Support  ADL's:  Intact  Cognition:  WNL noted mild intellectual functioning   Sleep:  Number of Hours: 6.75    Treatment Plan Summary: Daily contact with patient to assess and evaluate symptoms and progress in treatment and Medication management   Continue with current treatment plan on 07/09/2017 except where noted   Continue with Abilify 15 mg and Prozac 20 mg  for mood stabilization. Continue Atarax  50 mg PO PRN for anxiety  Continue with Trazodone 50 mg for insomnia  Reviewed labs: Potassium 2.9 Patient to continue  K- Dur, Klor 20 MEq X 3 doses with repeat lab on 07/10/2017  Will continue to monitor vitals ,medication compliance and treatment side effects while patient is here.   CSW will start working on disposition.  Patient to participate in therapeutic milieu   Oneta Rackanika N Pepper Kerrick, NP 07/09/2017, 2:27 PM

## 2017-07-10 LAB — POTASSIUM: Potassium: 4 mmol/L (ref 3.5–5.1)

## 2017-07-10 MED ORDER — ARIPIPRAZOLE 15 MG PO TABS: 15.0000 mg | ORAL_TABLET | Freq: Every day | ORAL | 0 refills | Status: DC

## 2017-07-10 MED ORDER — TRAZODONE HCL 100 MG PO TABS: ORAL_TABLET | ORAL | 0 refills | Status: DC

## 2017-07-10 MED ORDER — HYDROXYZINE HCL 50 MG PO TABS: 50.0000 mg | ORAL_TABLET | Freq: Four times a day (QID) | ORAL | 0 refills | Status: DC | PRN

## 2017-07-10 MED ORDER — FLUOXETINE HCL 20 MG PO CAPS: 20.0000 mg | ORAL_CAPSULE | Freq: Every day | ORAL | 0 refills | Status: DC

## 2017-07-10 NOTE — [facility] Suicide Risk Assessment (Signed)
[facility]   Principal Problem: Schizoaffective disorder [facility](HCC) Discharge Diagnoses:  Patient Active Problem List   Diagnosis Date Noted  . Schizoaffective disorder (HCC) [F25.9] 07/06/2017  . 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 Stuttering [F80.81] 08/09/2015  . Cannabis use disorder, moderate, dependence (HCC) [F12.20] 08/09/2015  . MDD (major depressive disorder), recurrent, severe, with psychosis (HCC) [F33.3] 02/16/2013    Total Time spent with patient: 30 minutes  Musculoskeletal: Strength & Muscle Tone: within normal limits Gait & Station: normal Patient leans: N/A  Psychiatric Specialty Exam: Review of Systems  Constitutional: Negative for chills and fever.  Respiratory: Negative for cough and shortness of breath.   Cardiovascular: Negative for chest pain and palpitations.  Gastrointestinal: Negative for heartburn and nausea.  Psychiatric/Behavioral: Negative for depression, hallucinations and suicidal ideas. The patient is not nervous/anxious.     Blood pressure 121/60, pulse 99, temperature 98 F (36.7 C), temperature source Oral, resp. rate 20, height 5\' 11"  (1.803 m), weight 101.6 kg (224 lb).Body mass index is 31.24 kg/m.  General Appearance: Casual and Fairly Groomed  Patent attorneyye Contact::  Good  Speech:  Clear and Coherent and Normal Rate  Volume:  Normal  Mood:  Anxious and Euthymic  Affect:  Congruent and Flat  Thought Process:  Coherent and Goal Directed  Orientation:  Full (Time, Place, and Person)  Thought Content:  Hallucinations: Auditory Visual  Suicidal Thoughts:  No  Homicidal Thoughts:  No  Memory:  Immediate;   Good Recent;   Good Remote;   Good  Judgement:  Fair  Insight:  Fair  Psychomotor Activity:  Normal  Concentration:  Good  Recall:  Good  Fund of Knowledge:Poor  Language: Fair  Akathisia:  No   Handed:  Right  AIMS (if indicated):     Assets:  Manufacturing systems engineerCommunication Skills Housing Physical Health Resilience Social Support  Sleep:  Number of Hours: 6.75  Cognition: WNL  ADL's:  Intact   Mental Status Per Nursing Assessment::   On Admission:  Suicidal ideation indicated by patient  Demographic Factors:  Male and Low socioeconomic status  Loss Factors: NA  Historical Factors: Family history of mental illness or substance abuse and Impulsivity  Risk Reduction Factors:   Positive social support, Positive therapeutic relationship and Positive coping skills or problem solving skills  Continued Clinical Symptoms:  Schizophrenia:   Paranoid or undifferentiated type  Cognitive Features That Contribute To Risk:  None    Suicide Risk:  Minimal: No identifiable suicidal ideation.  Patients presenting with no risk factors but with morbid ruminations; may be classified as minimal risk based on the severity of the depressive symptoms  Follow-up Information    [facility] Follow up.   Why:  Ms Larene Beachvickie will be in touch with you about setting up a day to start.  Please call her the day of discharge from the hospital to confirm Contact information: [address]  [place] [phone]       [facility] Follow up on 07/16/2017.   Why:  Monday at 10:30 for your next appointment with the Dr.   Benay Pillowontact information: [address] [place] [address] [phone]         Subjective Data: Marvin Crawford is 21 y/o M with history of schizoaffective disorder, borderline intellectual functioning, and cannabis use disorder who was admitted voluntarily with worsening depression, SI, and  worsening AH. Pt was restarted on medications of abilify and prozac, and he had improvement of his symptoms. Today upon evaluation, pt reports that he is doing well overall. He reports that he has AH "only a little" and he reports minimal VH of seeing "black and white  stuff." He denies SI/HI. He is sleeping well and appetite is good. He reports some nausea which he associates with prozac, and we discussed eating food when he takes the medication and continuing to monitor for that side effect as an outpatient. Pt was able to engage in safety planning including [facility] or contact emergency services if he feels unable to maintain his own safety. Pt had no further questions, comments, or concerns.     Plan Of Care/Follow-up recommendations:  - Discharge to outpatient level of care  -Schizoaffective disorder    - Continue with Abilify 15 mg             - Continue Prozac 20 mg  for mood stabilization.   - Continue Atarax  50 mg PO PRN for anxiety    - Continue with Trazodone 50 mg for insomnia  Activity:  as tolerated Diet:  normal Tests:  NA Other:  see above for DC plan  Marvin Likenshristopher T Kayleann Mccaffery, MD 07/10/2017, 9:11 AM

## 2017-07-10 NOTE — Progress Notes (Signed)
Recreation Therapy Notes  Date: 07/10/17 Time: 0950 Location: 500 Hall Dayroom  Group Topic: Goal Setting  Goal Area(s) Addresses:  Patient will be able to identify at least 3 life goals.  Patient will be able to identify benefit of investing in life goals.  Patient will be able to identify benefit of setting life goals.   Intervention: Worksheet  Activity: Life Goals.  Patients were given a worksheet broken down into 6 categories (family, friends, work/school, spirituality, body and mental health).  Patients were to identify what they were doing well, what they need to improve and make a goal to complete the improvement.  Education:  Discharge Planning, Coping Skills, Life Goals  Education Outcome: Acknowledges Education/In Group Clarification Provided/Needs Additional Education  Clinical Observations: Pt did not attend group.   Caroll RancherMarjette Samah Lapiana, LRT/CTRS         Caroll RancherLindsay, Yania Bogie A 07/10/2017 11:34 AM

## 2017-07-10 NOTE — Plan of Care (Signed)
Pt safe on unit at this time

## 2017-07-10 NOTE — Progress Notes (Signed)
Recreation Therapy Notes  INPATIENT RECREATION TR PLAN  Patient Details Name: Marvin Crawford MRN: [id number] DOB: Sep 30, 1995 Today's Date: 07/10/2017  Rec Therapy Plan Is patient appropriate for Therapeutic Recreation?: Yes Treatment times per week: about 3 days Estimated Length of Stay: 5-7 days TR Treatment/Interventions: UE/LE Coordination activities  Discharge Criteria Pt will be discharged from therapy if:: Discharged Treatment plan/goals/alternatives discussed and agreed upon by:: Patient/family  Discharge Summary Short term goals set: Pt will be able to identify coping skills for depression. Short term goals met: Not met Progress toward goals comments: (Pt did not attend groups.) Reason goals not met: Pt did not attend groups. Therapeutic equipment acquired: N/A Reason patient discharged from therapy: Discharge from hospital Pt/family agrees with progress & goals achieved: Yes Date patient discharged from therapy: 07/10/17    [name], LRT/CTRS   Ria Comment, Eretria Manternach A 07/10/2017, 12:07 PM

## 2017-07-10 NOTE — Plan of Care (Signed)
Pt did not attend groups.   Enis Leatherwood, LRT/CTRS 

## 2017-07-10 NOTE — Progress Notes (Signed)
D: Pt visible on unit A: Pt was offered support and encouragement. Pt was given scheduled medications. Pt was encourage to attend groups. Q 15 minute checks were done for safety.  R:Pt attends groups and interacts well with peers and staff. Pt is taking medication. Pt has no complaints.Pt receptive to treatment and safety maintained on unit.

## 2017-07-10 NOTE — Progress Notes (Signed)
Patient discharged to lobby. Patient was stable and appreciative at that time. All papers and prescriptions were given and valuables returned. Verbal understanding expressed. Denies SI/HI and A/VH. Patient given opportunity to express concerns and ask questions.  

## 2017-07-10 NOTE — Progress Notes (Addendum)
  [facility] :  Will you be returning to the same living situation after discharge:  Yes,  home At discharge, do you have transportation home?: Yes,  moher Do you have the ability to pay for your medications: Yes,  MCD  Release of information consent forms completed and in the chart;  Patient's signature needed at discharge.  Patient to Follow up at: Follow-up Information    [facility] Follow up.   Why:  Ms Marvin Crawford will be in touch with you about setting up a day to start.  Please call her the day of discharge from the hospital to confirm Contact information: [address]  [place] [phone]       [facility] Follow up on 07/16/2017.   Why:  Monday at 10:30 for your next appointment with the Dr.  Bonita QuinYou signed up for help from [facility] with [facility]. You can reach her at [phone] for help with transportation to TP appointments Contact information: [address] [place] [address] [phone]           Next level of care provider [facility] Link:no  Safety Planning and Suicide Prevention discussed: Yes,  yes  Have you used any form of tobacco in the last 30 days? (Cigarettes, Smokeless Tobacco, Cigars, and/or Pipes): No  Has patient been referred to the Quitline?: N/A patient is not a smoker  Patient has been referred for addiction treatment: N/A  Marvin RogueRodney B Kinslee Dalpe, LCSW 07/10/2017, 10:49 AM

## 2017-07-10 NOTE — Discharge Summary (Signed)
Physician Discharge Summary Note  Patient:  Marvin Crawford is an 21 y.o., male MRN:  [id number] DOB:  Nov 19, 1995 Patient phone:  [phone] (home)  Patient address:   [address] [place] [address],  Total Time spent with patient: Greater than 30 minutes  Date of Admission:  07/05/2017  Date of Discharge: 07-10-17  Reason for Admission: Worsening symptoms of Schizoaffective disorder with psychosis, disorganizations & agitation.  Principal Problem: Schizoaffective disorder [facility])  Discharge Diagnoses: Patient Active Problem List   Diagnosis Date Noted  . Schizoaffective disorder (HCC) [F25.9] 07/06/2017  . 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 Stuttering [F80.81] 08/09/2015  . Cannabis use disorder, moderate, dependence (HCC) [F12.20] 08/09/2015  . MDD (major depressive disorder), recurrent, severe, with psychosis (HCC) [F33.3] 02/16/2013   Past Psychiatric History: Major depressive disorder, Cannabis abuse  Past Medical History:  Past Medical History:  Diagnosis Date  . ADD (attention deficit disorder)   . Anxiety   . Depression   . Environmental allergies    History reviewed. No pertinent surgical history.  Family History:  Family History  Problem Relation Age of Onset  . Hypertension Mother   . Hypertension Father   . Mental illness Neg Hx    Family Psychiatric  History: See H&P  Social History:  Social History   Substance and Sexual Activity  Alcohol Use No  . Alcohol/week: 0.0 oz     Social History   Substance and Sexual Activity  Drug Use Yes  . Types: Other-see comments, Marijuana   Comment: oil smoked in pipe calls "DAB", friends buy for him    Social History   Socioeconomic History  . Marital status: Single    Spouse name: None  . Number of children: None  . Years of education: None  . Highest education level:  None  Social Needs  . Financial resource strain: None  . Food insecurity - worry: None  . Food insecurity - inability: None  . Transportation needs - medical: None  . Transportation needs - non-medical: None  Occupational History  . None  Tobacco Use  . Smoking status: Current Every Day Smoker    Packs/day: 2.00  . Smokeless tobacco: Never Used  Substance and Sexual Activity  . Alcohol use: No    Alcohol/week: 0.0 oz  . Drug use: Yes    Types: Other-see comments, Marijuana    Comment: oil smoked in pipe calls "DAB", friends buy for him  . Sexual activity: Yes    Birth control/protection: Condom  Other Topics Concern  . None  Social History Narrative  . None   Hospital Course: Marvin Crawford is a 21 y/o M with history of schizoaffective disorder vs MDD with psychotic features who was admitted voluntarily with worsening symptoms of psychosis, agitation, depression, SI, and disorganization. Pt was brought in by police when his mother called emergency services after pt had episode of agitation and punched a wall. Pt then told police that he had depression, anxiety, and SI with plan use the gas from the fireplace. He was voluntarily taken to the ED and transferred to [facility]. Upon evaluation, pt reports that he has been struggling with depression and feeling overwhelmed which resulted in punching a hole in the wall. He explains, "I busted a hole in the wall, and I was mad at life and stuff." He endorses SI with plan to "shoot myself" and pt notes that he does  not own a gun but could obtain access to one. He denies HI. He reports AH of "black and white things" and when asked to provide further detail, he adds, "I hear visual stuff." [facility].  Marvin Crawford was discharged from this hospital on 05-15-17 after receiving mood stabilization treatments. He was discharged to follow-up care for routine psychiatric & medication management on an outpatient. However, he was  re-admitted to the hospital with worsening symptoms of depression with psychosis, agitation & disorganization requiring further mood stabilization treatments.   After evaluation of his current presentating symptoms, Marvin Crawford was started on the medication regimen targeting those presenting symptoms. He received & was discharged on; medication regimen included; Abilify15 mg daily for mood control, Fluoxetine 20 mg for depression, Hydroxyzine 50 mg prn for anxiety & Trazodone 100 mg daily for sleep. He was also enrolled & participated in the group counseling sessions being offered and held on this unit, he learned coping skills that should help him cope better & maintain mood stability after discharge. He presented no other significant pre-existing health issues that required treatment and or monitoring. He tolerated his treatment regimen without any significant adverse effects and or reactions reported or noted.  During the course of his hospitalizations, Kavari's symptoms were evaluated on daily basis by a clinical provider to assure his symptoms were responding to his treatment regimen. This is evidenced by his reports of decreasing symptoms, improved mood, sleep, appetite & presentation of good affect. He is currently being discharged to continue routine psychiatric care & medication management on an outpatient basis as noted below. He is provided with all the pertinent information required to make this appointment without problems.   On this day of hospital discharge, Marvin Crawford is in much improved condition than upon admission. Upon his discharge evaluation with the attending psychiatrist, he stated that he is feeling more control of his mood. His symptoms were reported as significantly decreased or resolved completely. He denies any SI/HI & voiced no AVH. He is instructed & motivated to continue taking medications with a goal of continued improvement in mental health. He was picked up by his mother. He left [facility]  in no apparent distress with all belongings.  Physical Findings: AIMS: Facial and Oral Movements Muscles of Facial Expression: None, normal Lips and Perioral Area: None, normal Jaw: None, normal Tongue: None, normal,Extremity Movements Upper (arms, wrists, hands, fingers): None, normal Lower (legs, knees, ankles, toes): None, normal, Trunk Movements Neck, shoulders, hips: None, normal, Overall Severity Severity of abnormal movements (highest score from questions above): None, normal Incapacitation due to abnormal movements: None, normal Patient's awareness of abnormal movements (rate only patient's report): No Awareness, Dental Status Current problems with teeth and/or dentures?: No Does patient usually wear dentures?: No  CIWA:    COWS:     Musculoskeletal: Strength & Muscle Tone: within normal limits Gait & Station: normal Patient leans: N/A  Psychiatric Specialty Exam: Review of Systems  Constitutional: Negative.   HENT: Negative.   Eyes: Negative.   Respiratory: Negative.   Cardiovascular: Negative.   Gastrointestinal: Negative.   Genitourinary: Negative.   Musculoskeletal: Negative.   Skin: Negative.   Neurological: Negative.   Endo/Heme/Allergies: Negative.   Psychiatric/Behavioral: Positive for depression (Stabilized with medication prior to discharge). Negative for hallucinations, memory loss, substance abuse and suicidal ideas. The patient has insomnia (Stabilized with medication prior to discharge). The patient is not nervous/anxious.     Blood pressure 121/60, pulse 99, temperature  98 F (36.7 C), temperature source Oral, resp. rate 20, height 5\' 11"  (1.803 m), weight 101.6 kg (224 lb).Body mass index is 31.24 kg/m.  See Md's SRA   Have you used any form of tobacco in the last 30 days? (Cigarettes, Smokeless Tobacco, Cigars, and/or Pipes): No  Has this patient used any form of tobacco in the last 30 days? (Cigarettes, Smokeless Tobacco, Cigars, and/or Pipes),  No  Metabolic Disorder Labs:  Lab Results  Component Value Date   HGBA1C 5.2 05/10/2017   MPG 102.54 05/10/2017   MPG 117 09/11/2015   Lab Results  Component Value Date   PROLACTIN 6.5 05/10/2017   PROLACTIN 2.1 (L) 09/11/2015   Lab Results  Component Value Date   CHOL 125 05/10/2017   TRIG 54 05/10/2017   HDL 35 (L) 05/10/2017   CHOLHDL 3.6 05/10/2017   VLDL 11 05/10/2017   LDLCALC 79 05/10/2017   LDLCALC 79 09/11/2015   See Psychiatric Specialty Exam and Suicide Risk Assessment completed by Attending Physician prior to discharge.  Discharge destination:  Home  Is patient on multiple antipsychotic therapies at discharge:  No   Has Patient had three or more failed trials of antipsychotic monotherapy by history:  No  Recommended Plan for Multiple Antipsychotic Therapies: NA  Allergies as of 07/10/2017   No Known Allergies     Medication List    STOP taking these medications   cholecalciferol 1000 units tablet Commonly known as:  VITAMIN D   divalproex 250 MG 24 hr tablet Commonly known as:  DEPAKOTE ER   escitalopram 20 MG tablet Commonly known as:  LEXAPRO   famotidine 20 MG tablet Commonly known as:  PEPCID   hydrOXYzine 25 MG capsule Commonly known as:  VISTARIL   ibuprofen 200 MG tablet Commonly known as:  ADVIL,MOTRIN   QUEtiapine 25 MG tablet Commonly known as:  SEROQUEL     TAKE these medications     Indication  ARIPiprazole 15 MG tablet Commonly known as:  ABILIFY Take 1 tablet (15 mg total) by mouth daily. For mood control What changed:    medication strength  how much to take  when to take this  additional instructions  Another medication with the same name was removed. Continue taking this medication, and follow the directions you see here.  Indication:  Mood control   FLUoxetine 20 MG capsule Commonly known as:  PROZAC Take 1 capsule (20 mg total) by mouth daily. For depression What changed:  additional instructions   Indication:  Depression   hydrOXYzine 50 MG tablet Commonly known as:  ATARAX/VISTARIL Take 1 tablet (50 mg total) by mouth every 6 (six) hours as needed for anxiety. What changed:    medication strength  how much to take  Indication:  Feeling Anxious   traZODone 100 MG tablet Commonly known as:  DESYREL Take 1 tablet (100 mg) by mouth at bedtime: For sleep What changed:    medication strength  how much to take  how to take this  when to take this  reasons to take this  additional instructions  Indication:  Trouble Sleeping      Follow-up Information    [facility] Follow up.   Why:  Ms Larene Beachvickie will be in touch with you about setting up a day to start.  Please call her the day of discharge from the hospital to confirm Contact information: [address]  [place] [address] [phone]       [facility] Follow up on 07/16/2017.   Why:  Monday at 10:30 for your next appointment with the Dr.  Bonita Quin signed up for help from [facility] with [facility] care. You can reach her at [phone] for help with transportation to TP appointments Contact information: [address] [place] [address] [phone]          Follow-up recommendations: Activity:  As tolerated Diet: As recommended by your primary care doctor. Keep all scheduled follow-up appointments as recommended.    Comments: Patient is instructed prior to discharge to: Take all medications as prescribed by his/her mental healthcare provider. Report any adverse effects and or reactions from the medicines to his/her outpatient provider promptly. Patient has been instructed & cautioned: To not engage in alcohol and or illegal drug use while on prescription medicines. In the event of worsening symptoms, patient is instructed to call the crisis hotline, 911 and or go to the nearest ED for appropriate evaluation and treatment of symptoms. To follow-up with his/her primary  care provider for your other medical issues, concerns and or health care needs.    Signed: Sanjuana Kava, PMHNP, FNP-BC 07/11/2017, 12:46 PM   Patient seen, Suicide Assessment Completed.  Disposition Plan Reviewed   Dequarius Jeffries is 21 y/o M with history of schizoaffective disorder, borderline intellectual functioning, and cannabis use disorder who was admitted voluntarily with worsening depression, SI, and worsening AH. Pt was restarted on medications of abilify and prozac, and he had improvement of his symptoms. Today upon evaluation, pt reports that he is doing well overall. He reports that he has AH "only a little" and he reports minimal VH of seeing "black and white stuff." He denies SI/HI. He is sleeping well and appetite is good. He reports some nausea which he associates with prozac, and we discussed eating food when he takes the medication and continuing to monitor for that side effect as an outpatient. Pt was able to engage in safety planning including plan to return to [facility] or contact emergency services if he feels unable to maintain his own safety. Pt had no further questions, comments, or concerns.     Plan Of Care/Follow-up recommendations:  - Discharge to outpatient level of care  -Schizoaffective disorder                         - Continue with Abilify 15 mg             - Continue Prozac 20 mg for mood stabilization.             - ContinueAtarax 50 mg PO PRN for anxiety              - Continue with Trazodone 50 mg for insomnia  Activity:  as tolerated Diet:  normal Tests:  NA Other:  see above for DC plan  Micheal Likens, MD

## 2017-07-15 ENCOUNTER — Emergency Department (HOSPITAL_COMMUNITY)
Admission: EM | Admit: 2017-07-15 | Discharge: 2017-07-16 | Disposition: A | Discharge status: Home / Self Care | Payer: Medicaid Other | Attending: Emergency Medicine | Admitting: Emergency Medicine

## 2017-07-15 ENCOUNTER — Encounter (HOSPITAL_COMMUNITY): Payer: Self-pay | Admitting: Emergency Medicine

## 2017-07-15 DIAGNOSIS — R4587 Impulsiveness: Secondary | ICD-10-CM | POA: Diagnosis not present

## 2017-07-15 DIAGNOSIS — R45851 Suicidal ideations: Secondary | ICD-10-CM | POA: Diagnosis not present

## 2017-07-15 DIAGNOSIS — R4183 Borderline intellectual functioning: Secondary | ICD-10-CM | POA: Insufficient documentation

## 2017-07-15 DIAGNOSIS — F909 Attention-deficit hyperactivity disorder, unspecified type: Secondary | ICD-10-CM | POA: Diagnosis not present

## 2017-07-15 DIAGNOSIS — F259 Schizoaffective disorder, unspecified: Secondary | ICD-10-CM | POA: Insufficient documentation

## 2017-07-15 DIAGNOSIS — F419 Anxiety disorder, unspecified: Secondary | ICD-10-CM | POA: Insufficient documentation

## 2017-07-15 DIAGNOSIS — Z79899 Other long term (current) drug therapy: Secondary | ICD-10-CM | POA: Insufficient documentation

## 2017-07-15 DIAGNOSIS — F17200 Nicotine dependence, unspecified, uncomplicated: Secondary | ICD-10-CM | POA: Diagnosis not present

## 2017-07-15 DIAGNOSIS — Z046 Encounter for general psychiatric examination, requested by authority: Secondary | ICD-10-CM | POA: Diagnosis present

## 2017-07-15 DIAGNOSIS — F17210 Nicotine dependence, cigarettes, uncomplicated: Secondary | ICD-10-CM | POA: Diagnosis not present

## 2017-07-15 DIAGNOSIS — F1210 Cannabis abuse, uncomplicated: Secondary | ICD-10-CM | POA: Diagnosis not present

## 2017-07-15 LAB — CBC WITH DIFFERENTIAL/PLATELET
Basophils Absolute: 0 10*3/uL (ref 0.0–0.1)
Basophils Relative: 0 %
EOS ABS: 0.1 10*3/uL (ref 0.0–0.7)
Eosinophils Relative: 2 %
HEMATOCRIT: 39.9 % (ref 39.0–52.0)
HEMOGLOBIN: 13.7 g/dL (ref 13.0–17.0)
LYMPHS ABS: 2.7 10*3/uL (ref 0.7–4.0)
Lymphocytes Relative: 46 %
MCH: 29.2 pg (ref 26.0–34.0)
MCHC: 34.3 g/dL (ref 30.0–36.0)
MCV: 85.1 fL (ref 78.0–100.0)
MONO ABS: 0.2 10*3/uL (ref 0.1–1.0)
MONOS PCT: 4 %
NEUTROS ABS: 2.9 10*3/uL (ref 1.7–7.7)
NEUTROS PCT: 48 %
PLATELETS: 229 10*3/uL (ref 150–400)
RBC: 4.69 MIL/uL (ref 4.22–5.81)
RDW: 12.9 % (ref 11.5–15.5)
WBC: 6 10*3/uL (ref 4.0–10.5)

## 2017-07-15 LAB — BASIC METABOLIC PANEL
Anion gap: 7 (ref 5–15)
BUN: 7 mg/dL (ref 6–20)
CO2: 29 mmol/L (ref 22–32)
CREATININE: 0.99 mg/dL (ref 0.61–1.24)
Calcium: 10 mg/dL (ref 8.9–10.3)
Chloride: 106 mmol/L (ref 101–111)
GFR calc Af Amer: >60 mL/min (ref >60)
GLUCOSE: 104 mg/dL — AB (ref 65–99)
Potassium: 3.7 mmol/L (ref 3.5–5.1)
SODIUM: 142 mmol/L (ref 135–145)

## 2017-07-15 LAB — RAPID URINE DRUG SCREEN, HOSP PERFORMED
Amphetamines: NOT DETECTED
Barbiturates: NOT DETECTED
Benzodiazepines: NOT DETECTED
Cocaine: NOT DETECTED
OPIATES: NOT DETECTED
Tetrahydrocannabinol: NOT DETECTED

## 2017-07-15 LAB — ETHANOL

## 2017-07-15 MED ORDER — HYDROXYZINE HCL 25 MG PO TABS: 50.0000 mg | ORAL_TABLET | Freq: Four times a day (QID) | ORAL | Status: DC | PRN

## 2017-07-15 MED ORDER — ARIPIPRAZOLE 5 MG PO TABS
15.0000 mg | ORAL_TABLET | Freq: Every day | ORAL | Status: DC
  Administered 2017-07-16: 15 mg via ORAL
  Filled 2017-07-15: qty 1

## 2017-07-15 MED ORDER — FLUOXETINE HCL 20 MG PO CAPS
20.0000 mg | ORAL_CAPSULE | Freq: Every day | ORAL | Status: DC
  Administered 2017-07-16: 20 mg via ORAL
  Filled 2017-07-15: qty 1

## 2017-07-15 MED ORDER — TRAZODONE HCL 100 MG PO TABS
100.0000 mg | ORAL_TABLET | Freq: Every day | ORAL | Status: DC
  Administered 2017-07-15: 100 mg via ORAL
  Filled 2017-07-15: qty 1

## 2017-07-15 MED ORDER — ONDANSETRON HCL 4 MG PO TABS: 4.0000 mg | ORAL_TABLET | Freq: Three times a day (TID) | ORAL | Status: DC | PRN

## 2017-07-15 MED ORDER — ALUM & MAG HYDROXIDE-SIMETH 200-200-20 MG/5ML PO SUSP: 30.0000 mL | Freq: Four times a day (QID) | ORAL | Status: DC | PRN

## 2017-07-15 MED ORDER — ACETAMINOPHEN 325 MG PO TABS: 650.0000 mg | ORAL_TABLET | ORAL | Status: DC | PRN

## 2017-07-15 NOTE — ED Triage Notes (Addendum)
Patient with a history of schizophrenia comes in escorted by police.  He is voluntary and cooperative.  Family says he has been walking out in the snow for several hours and when police found him he was soaking wet asking to come to the hospital.  His mother reports patient has been taking his medications.  Patient reports he has been having suicidal thoughts and when asked if he had a plan he said he would cut himself with a knife.  Denies homicidal thoughts and AV hallucinations.

## 2017-07-15 NOTE — ED Notes (Signed)
Bed: WA16 Expected date:  Expected time:  Means of arrival:  Comments: 

## 2017-07-15 NOTE — ED Notes (Signed)
Pt given apple juice and graham crackers 

## 2017-07-15 NOTE — ED Notes (Signed)
Bed: WA13 Expected date:  Expected time:  Means of arrival:  Comments: 

## 2017-07-15 NOTE — ED Notes (Signed)
Pt ambulatory to bathroom and able to obtain urine sample

## 2017-07-15 NOTE — ED Provider Notes (Signed)
[facility]-EMERGENCY DEPT Provider Note   CSN: [id number] Arrival date & time: 07/15/17  1744     History   Chief Complaint Chief Complaint  Patient presents with  . Medical Clearance    HPI Marvin Crawford is a 21 y.o. male w PMHx ADD, anxiety, MDD, borderline intellectual functioning, schizoaffective disorder, BIB [employer] to the ED when patient was found walking in the snow for several hours, found soaking wet. Pt is voluntary. Pt reports he has been taking his medications as prescribed, and took them today. Reports to nursing and during this evalution that he is feeling sad and having thoughts of harming himself since yesterday. States he would cut himself to end his life. Denies HI. Denies any medical complaints.  The history is provided by the patient.    Past Medical History:  Diagnosis Date  . ADD (attention deficit disorder)   . Anxiety   . Depression   . Environmental allergies     Patient Active Problem List   Diagnosis Date Noted  . Schizoaffective disorder (HCC) 07/06/2017  . Major depressive disorder, recurrent, severe with psychotic features (HCC) 07/05/2017  . 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 Stuttering 08/09/2015  . Cannabis use disorder, moderate, dependence (HCC) 08/09/2015  . MDD (major depressive disorder), recurrent, severe, with psychosis (HCC) 02/16/2013    History reviewed. No pertinent surgical history.     Home Medications    Prior to Admission medications   Medication Sig Start Date End Date Taking? Authorizing Provider  ARIPiprazole (ABILIFY) 15 MG tablet Take 1 tablet (15 mg total) by mouth daily. For mood control 07/11/17   Armandina StammerNwoko, Agnes I, NP  FLUoxetine (PROZAC) 20 MG capsule Take 1 capsule (20 mg total) by mouth daily. For depression 07/11/17   Armandina StammerNwoko, Agnes I, NP  hydrOXYzine (ATARAX/VISTARIL) 50 MG tablet Take 1 tablet (50 mg total) by mouth every 6 (six) hours as needed for anxiety. 07/10/17   Armandina StammerNwoko,  Agnes I, NP  traZODone (DESYREL) 100 MG tablet Take 1 tablet (100 mg) by mouth at bedtime: For sleep 07/10/17   Sanjuana KavaNwoko, Agnes I, NP    Family History Family History  Problem Relation Age of Onset  . Hypertension Mother   . Hypertension Father   . Mental illness Neg Hx     Social History Social History   Tobacco Use  . Smoking status: Current Every Day Smoker    Packs/day: 2.00  . Smokeless tobacco: Never Used  Substance Use Topics  . Alcohol use: No    Alcohol/week: 0.0 oz  . Drug use: Yes    Types: Other-see comments, Marijuana    Comment: oil smoked in pipe calls "DAB", friends buy for him     Allergies   Patient has no known allergies.   Review of Systems Review of Systems  Psychiatric/Behavioral: Positive for dysphoric mood and suicidal ideas.  All other systems reviewed and are negative.    Physical Exam Updated Vital Signs BP (!) 157/60 (BP Location: Right Arm)   Pulse 72   Temp 97.7 F (36.5 C) (Oral)   Resp 18   SpO2 100%   Physical Exam  Constitutional: He is oriented to person, place, and time. He appears well-developed and well-nourished. No distress.  HENT:  Head: Normocephalic and atraumatic.  Eyes: Conjunctivae and EOM are normal. Pupils are equal, round, and reactive to light.  Cardiovascular: Normal rate, regular rhythm, normal heart sounds and intact distal pulses.  Pulmonary/Chest: Effort normal and breath sounds  normal. No respiratory distress.  Abdominal: Soft. Bowel sounds are normal.  Musculoskeletal: Normal range of motion.  B/l feet are damp, pink with normal capillary refill.   Neurological: He is alert and oriented to person, place, and time.  Skin: Skin is warm.  Psychiatric: He has a normal mood and affect. His behavior is normal.  Nursing note and vitals reviewed.    ED Treatments / Results  Labs (all labs ordered are listed, but only abnormal results are displayed) Labs Reviewed  CBC WITH DIFFERENTIAL/PLATELET  BASIC  METABOLIC PANEL  RAPID URINE DRUG SCREEN, HOSP PERFORMED  ETHANOL    EKG  EKG Interpretation None       Radiology No results found.  Procedures Procedures (including critical care time)  Medications Ordered in ED Medications  acetaminophen (TYLENOL) tablet 650 mg (not administered)  ondansetron (ZOFRAN) tablet 4 mg (not administered)  alum & mag hydroxide-simeth (MAALOX/MYLANTA) 200-200-20 MG/5ML suspension 30 mL (not administered)     Initial Impression / Assessment and Plan / ED Course  I have reviewed the triage vital signs and the nursing notes.  Pertinent labs & imaging results that were available during my care of the patient were reviewed by me and considered in my medical decision making (see chart for details).     Pt brought in by [employer] found walking in the snow, soaking wet. Pt asking to be taken to hospital. Reporting SI with plan to cut himself. Reports taking his daily medications as prescribed. Denies drug or EtOH use. No evidence of cold injury on exam, euthermic. Pt without medical complaints. TTS consulted for evaluation. Pt is medically cleared.  Final Clinical Impressions(s) / ED Diagnoses   Final diagnoses:  Suicidal ideation    ED Discharge Orders    None       Robinson, [place]azil[place] N, PA-C 07/15/17 1947    Lorre NickAllen, Anthony, MD 07/15/17 2031

## 2017-07-15 NOTE — ED Notes (Addendum)
Grandmother's # [phone]

## 2017-07-15 NOTE — ED Notes (Signed)
Pt wanded by security and changed into paper scrubs, belonging in locker.

## 2017-07-15 NOTE — BH Assessment (Addendum)
Assessment Note *UDS not completed at time of assessment Marvin Crawford is an 21 y.o. male. Pt [facility]. Per chart review, pt had been walking in snow (14 inches accumulation today) for hours when [facility] found him. Pt asked them to bring him to ED. Pt is wearing scrubs and shivers during start of assessment. He puts blanket around himself. Pt currently endorses SI. He says he would kill himself by using a gun in his home or cutting himself with a knife. Pt has 3 previous suicide attempts. Per chart review, [facility]. Pt reports he is compliant with his meds. [facility]. [facility] and [facility]. Pt isn't oriented to date or name of [place] President. Much of info for assessment obtained from chart review. Pt's insight is poor. His speech is soft and slow. He endorses Insomnia and reports he has been eating a lot of junk food. Pt says he began walking at 3 pm today, but he won't say where he began his walk. He endorses [facility] "sometimes". Pt denies homicidal thoughts or physical aggression. Pt denies having any legal problems at this time.   Diagnosis: Schizoaffective Disorder  Past Medical History:  Past Medical History:  Diagnosis Date  . ADD (attention deficit disorder)   . Anxiety   . Depression   . Environmental allergies     History reviewed. No pertinent surgical history.  Family History:  Family History  Problem Relation Age of Onset  . Hypertension Mother   . Hypertension Father   . Mental illness Neg Hx     Social History:  reports that he has been smoking.  He has been smoking about 2.00 packs per day. he has never used smokeless tobacco. He reports that he uses drugs. Drugs: Other-see comments and Marijuana. He reports that he does not drink alcohol.  Additional Social History:  Alcohol / Drug Use Pain Medications: pt denies abuse - see  pta meds list Prescriptions: pt denies abuse - see pta meds list Over the Counter: pt denies abuse - see pta meds list History of alcohol / drug use?: Yes Longest period of sobriety (when/how long): unknown Negative Consequences of Use: Financial Substance #1 Name of Substance 1: marijuana 1 - Age of First Use: unable to assess 1 - Amount (size/oz): unable to assess 1 - Frequency: unable to assess 1 - Duration: unable to assess  CIWA: CIWA-Ar BP: (!) 157/60 Pulse Rate: 72 COWS:    Allergies: No Known Allergies  Home Medications:  (Not in a hospital admission)  OB/GYN Status:  No LMP for male patient.  General Assessment Data [facility] Assessment: In system Is this a Tele or Face-to-Face Assessment?: Face-to-Face Is this an Initial Assessment or a Re-assessment for this encounter?: Initial Assessment Marital status: Single Maiden name: n/a Is patient pregnant?: No Pregnancy Status: No Living Arrangements: Parent, Other relatives(mom & brother) Can pt return to current living arrangement?: Yes Admission Status: Voluntary Is patient capable of signing voluntary admission?: Yes Referral Source: Self/Family/Friend([facility]) Insurance type: medicaid     Crisis Care Plan Living Arrangements: Parent, Other relatives(mom & brother) Name of Psychiatrist: [facility] Name of Therapist: [facility]  Education Status Is patient currently in school?: No Highest grade of school patient has completed: 12 Name of school: [employer]  Risk to self with the past 6  months Suicidal Ideation: Yes-Currently Present Has patient been a risk to self within the past 6 months prior to admission? : Yes Suicidal Intent: No Has patient had any suicidal intent within the past 6 months prior to admission? : Yes Is patient at risk for suicide?: Yes Suicidal Plan?: Yes-Currently Present Has patient had any suicidal plan within the past 6 months prior to admission? :  Yes Specify Current Suicidal Plan: shot himself w/ long gun at home or cut hinmself w/ knife Access to Means: Yes Specify Access to Suicidal Means: access to knife and gun What has been your use of drugs/alcohol within the last 12 months?: pt denies (per chart, hx of thc and etoh use) Previous Attempts/Gestures: Yes How many times?: 3 Other Self Harm Risks: n/a Triggers for Past Attempts: Unknown Intentional Self Injurious Behavior: None Family Suicide History: No Recent stressful life event(s): (pt argues w/ mom) Persecutory voices/beliefs?: No Depression: Yes Depression Symptoms: Insomnia Substance abuse history and/or treatment for substance abuse?: No Suicide prevention information given to non-admitted patients: Not applicable  Risk to Others within the past 6 months Homicidal Ideation: No Does patient have any lifetime risk of violence toward others beyond the six months prior to admission? : No Thoughts of Harm to Others: No Current Homicidal Intent: No Current Homicidal Plan: No Identified Victim: none History of harm to others?: No Assessment of Violence: None Noted Violent Behavior Description: pt denies hx violence Does patient have access to weapons?: Yes (Comment) Criminal Charges Pending?: No Does patient have a court date: No Is patient on probation?: No  Psychosis Hallucinations: Auditory, Visual Delusions: None noted  Mental Status Report Appearance/Hygiene: Unremarkable, In scrubs Eye Contact: Fair Motor Activity: Freedom of movement(shivering occasionally ) Speech: Soft, Slow Level of Consciousness: Alert, Quiet/awake Mood: Euthymic Affect: Anxious, Blunted Anxiety Level: Minimal Thought Processes: Coherent, Relevant Judgement: Partial Orientation: Person Obsessive Compulsive Thoughts/Behaviors: None  Cognitive Functioning Concentration: Decreased Memory: Recent Impaired, Remote Intact IQ: Average Insight: Poor Impulse Control:  Poor Appetite: Fair Sleep: Decreased Vegetative Symptoms: None  ADLScreening [facility]) Patient's cognitive ability adequate to safely complete daily activities?: Yes Patient able to express need for assistance with ADLs?: Yes Independently performs ADLs?: Yes (appropriate for developmental age)  Prior Inpatient Therapy Prior Inpatient Therapy: Yes Prior Therapy Dates: 2014 - 2018 [facility] Reason for Treatment: SI, schizophrenia  Prior Outpatient Therapy Prior Outpatient Therapy: Yes Prior Therapy Dates: currently Prior Therapy Facilty/Provider(s): [facility] Reason for Treatment: schizophrenia med management Does patient have an ACCT team?: No Does patient have Intensive In-House Services?  : No Does patient have [facility] services? : No Does patient have P4CC services?: No  ADL Screening (condition at time of admission) Patient's cognitive ability adequate to safely complete daily activities?: Yes Is the patient deaf or have difficulty hearing?: No Does the patient have difficulty seeing, even when wearing glasses/contacts?: No Does the patient have difficulty concentrating, remembering, or making decisions?: Yes Patient able to express need for assistance with ADLs?: Yes Does the patient have difficulty dressing or bathing?: No Independently performs ADLs?: Yes (appropriate for developmental age) Does the patient have difficulty walking or climbing stairs?: No Weakness of Legs: None Weakness of Arms/Hands: None  Home Assistive Devices/Equipment Home Assistive Devices/Equipment: None    Abuse/Neglect Assessment (Assessment to be complete while patient is alone) Physical Abuse: Denies Verbal Abuse: Denies Sexual Abuse: Denies Exploitation of patient/patient's resources: Denies Self-Neglect: Denies     Advance Directives (For  Healthcare) Does Patient Have a Medical Advance Directive?: No Would  patient like information on creating a medical advance directive?: No - Patient declined    Additional Information 1:1 In Past 12 Months?: No CIRT Risk: No Elopement Risk: No Does patient have medical clearance?: Yes     Disposition:  Disposition Initial Assessment Completed for this Encounter: Yes Disposition of Patient: Inpatient treatment program Type of inpatient treatment program: Adult(jason berry np recommends inpatien treatment)  On Site Evaluation by:   Reviewed with Physician:    Donnamarie RossettiMCLEAN, Depaul Arizpe P 07/15/2017 8:18 PM

## 2017-07-15 NOTE — ED Notes (Signed)
Pt attempted to urinate but could not at this time.  

## 2017-07-16 DIAGNOSIS — F1210 Cannabis abuse, uncomplicated: Secondary | ICD-10-CM | POA: Diagnosis not present

## 2017-07-16 DIAGNOSIS — F259 Schizoaffective disorder, unspecified: Secondary | ICD-10-CM | POA: Diagnosis not present

## 2017-07-16 DIAGNOSIS — F17210 Nicotine dependence, cigarettes, uncomplicated: Secondary | ICD-10-CM | POA: Diagnosis not present

## 2017-07-16 DIAGNOSIS — R4587 Impulsiveness: Secondary | ICD-10-CM | POA: Diagnosis not present

## 2017-07-16 MED ORDER — LORAZEPAM 0.5 MG PO TABS
0.5000 mg | ORAL_TABLET | Freq: Once | ORAL | Status: AC
  Administered 2017-07-16: 0.5 mg via ORAL
  Filled 2017-07-16: qty 1

## 2017-07-16 NOTE — ED Notes (Signed)
Mother here to pick up patient

## 2017-07-16 NOTE — ED Notes (Signed)
Pt mother called. Verified pt mother over phone and provided update that pt is currently being held and still under observation.

## 2017-07-16 NOTE — ED Notes (Signed)
Pt sleeping soundly. Sitter has pt in sight.

## 2017-07-16 NOTE — ED Notes (Signed)
Bed: WA30 Expected date:  Expected time:  Means of arrival:  Comments: Room 16 

## 2017-07-16 NOTE — Discharge Instructions (Signed)
Follow up with   [facility] Follow up on 07/16/2017.   Why:  Monday at 10:30 for your next appointment with the Dr.  Bonita QuinYou signed up for help from [facility] with [facility]. You can reach her at [phone] for help with transportation to TP appointments Contact information: [address] [place] [address] [phone]

## 2017-07-16 NOTE — ED Notes (Signed)
Pt sleeping soundly in no distress.

## 2017-07-16 NOTE — Progress Notes (Addendum)
4:20 PM CSW spoke with pt's mother. [name] informed CSW that her father cannot pick him up. [name] is leaving [place] now and states she will be to pick up the pt in about an hour.   3:53 PM CSW called pt's mother, [name] at [phone]. [name] stated she would be able to get someone to come pick her up. Pt mother will call pt's grandfather to have him pick him up. [name] will call back with an exact time of pick up. CSW will call again at 430 to find an exact time for pick up.   Marvin Crawford, Silverio LayLCSWA  Emergency Room  [phone]

## 2017-07-16 NOTE — Consult Note (Signed)
[facility] Face-to-Face Psychiatry Consult   Reason for Consult:  Suicidal ideation Referring Physician:  EDP Patient Identification: Marvin Crawford MRN:  [id number] Principal Diagnosis: Schizoaffective disorder [facility]) Diagnosis:   Patient Active Problem List   Diagnosis Date Noted  . Schizoaffective disorder ([place]) [F25.9] 07/06/2017  . Major depressive disorder, recurrent, severe with psychotic features ([place]) [F33.3] 07/05/2017  . 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 Stuttering [F80.81] 08/09/2015  . Cannabis use disorder, moderate, dependence ([place]) [F12.20] 08/09/2015  . MDD (major depressive disorder), recurrent, severe, with psychosis ([place]) [F33.3] 02/16/2013    Total Time spent with patient: 45 minutes  Subjective:   Marvin Crawford is a 21 y.o. male patient admitted with suicidal ideation.  HPI:  Pt was seen and chart reviewed with treatment team and Dr Mariea Clonts. Pt stated he was wandering around in the snow for hours yesterday. Pt stated he is a the hospital because "I pissed on the floor, that's why." Pt stated he lives with no one and then stated he lives with his mother. Pt denies suicidal ideation and denies making any suicidal statements. Pt was just discharged fro [facility] on 07-10-2017 and frequently presents to the [facility] with similar complaints of suicidal ideation. Pt's UDS and BAL are negative. Per nursing staff: Pt will urinate on the floor and throw things when he is in the ED which is perceived as volitional to avoid discharge.  Pt has had 6 emergency room visits and three inpatient admissions in the past six months. Pt has a diagnosis of Borderline Intellectual Functioning. Pt is psychiatrically clear for discharge.   Past Psychiatric History: As above  Risk to Self: None Risk to Others: None Prior Inpatient Therapy: Prior Inpatient Therapy: Yes Prior Therapy Dates: 2014 - 2018 [facility]Provider([facility] Reason for  Treatment: SI, schizophrenia Prior Outpatient Therapy: Prior Outpatient Therapy: Yes Prior Therapy Dates: currently Prior Therapy Facilty/Provider(s): [facility] Reason for Treatment: schizophrenia med management Does patient have an ACCT team?: No Does patient have Intensive In-House Services?  : No Does patient have [facility] services? : No Does patient have P4CC services?: No  Past Medical History:  Past Medical History:  Diagnosis Date  . ADD (attention deficit disorder)   . Anxiety   . Depression   . Environmental allergies    History reviewed. No pertinent surgical history. Family History:  Family History  Problem Relation Age of Onset  . Hypertension Mother   . Hypertension Father   . Mental illness Neg Hx    Family Psychiatric  History: Unknown Social History:  Social History   Substance and Sexual Activity  Alcohol Use No  . Alcohol/week: 0.0 oz     Social History   Substance and Sexual Activity  Drug Use Yes  . Types: Other-see comments, Marijuana   Comment: oil smoked in pipe calls "DAB", friends buy for him    Social History   Socioeconomic History  . Marital status: Single    Spouse name: None  . Number of children: None  . Years of education: None  . Highest education level: None  Social Needs  . Financial resource strain: None  . Food insecurity - worry: None  . Food insecurity - inability: None  . Transportation needs - medical: None  . Transportation needs - non-medical: None  Occupational History  . None  Tobacco Use  . Smoking status: Current Every Day Smoker    Packs/day: 2.00  . Smokeless tobacco: Never Used  Substance and Sexual Activity  . Alcohol use: No    Alcohol/week: 0.0 oz  . Drug use: Yes    Types: Other-see comments, Marijuana    Comment: oil smoked in pipe calls "DAB", friends buy for him  . Sexual activity: Yes    Birth control/protection: Condom  Other Topics Concern  . None  Social History Narrative  . None    Additional Social History: N/A    Allergies:  No Known Allergies  Labs:  Results for orders placed or performed during the hospital encounter of 07/15/17 (from the past 48 hour(s))  CBC with Differential     Status: None   Collection Time: 07/15/17  7:32 PM  Result Value Ref Range   WBC 6.0 4.0 - 10.5 K/uL   RBC 4.69 4.22 - 5.81 MIL/uL   Hemoglobin 13.7 13.0 - 17.0 g/dL   HCT 39.9 39.0 - 52.0 %   MCV 85.1 78.0 - 100.0 fL   MCH 29.2 26.0 - 34.0 pg   MCHC 34.3 30.0 - 36.0 g/dL   RDW 12.9 11.5 - 15.5 %   Platelets 229 150 - 400 K/uL   Neutrophils Relative % 48 %   Neutro Abs 2.9 1.7 - 7.7 K/uL   Lymphocytes Relative 46 %   Lymphs Abs 2.7 0.7 - 4.0 K/uL   Monocytes Relative 4 %   Monocytes Absolute 0.2 0.1 - 1.0 K/uL   Eosinophils Relative 2 %   Eosinophils Absolute 0.1 0.0 - 0.7 K/uL   Basophils Relative 0 %   Basophils Absolute 0.0 0.0 - 0.1 K/uL  Basic metabolic panel     Status: Abnormal   Collection Time: 07/15/17  7:32 PM  Result Value Ref Range   Sodium 142 135 - 145 mmol/L   Potassium 3.7 3.5 - 5.1 mmol/L   Chloride 106 101 - 111 mmol/L   CO2 29 22 - 32 mmol/L   Glucose, Bld 104 (H) 65 - 99 mg/dL   BUN 7 6 - 20 mg/dL   Creatinine, Ser 0.99 0.61 - 1.24 mg/dL   Calcium 10.0 8.9 - 10.3 mg/dL   GFR calc non Af Amer >60 >60 mL/min   GFR calc Af Amer >60 >60 mL/min    Comment: (NOTE) The eGFR has been calculated using the CKD EPI equation. This calculation has not been validated in all clinical situations. eGFR's persistently <60 mL/min signify possible Chronic Kidney Disease.    Anion gap 7 5 - 15  Urine rapid drug screen (hosp performed)     Status: None   Collection Time: 07/15/17  7:32 PM  Result Value Ref Range   Opiates NONE DETECTED NONE DETECTED   Cocaine NONE DETECTED NONE DETECTED   Benzodiazepines NONE DETECTED NONE DETECTED   Amphetamines NONE DETECTED NONE DETECTED   Tetrahydrocannabinol NONE DETECTED NONE DETECTED   Barbiturates NONE DETECTED  NONE DETECTED    Comment:        DRUG SCREEN FOR MEDICAL PURPOSES ONLY.  IF CONFIRMATION IS NEEDED FOR ANY PURPOSE, NOTIFY LAB WITHIN 5 DAYS.        LOWEST DETECTABLE LIMITS FOR URINE DRUG SCREEN Drug Class       Cutoff (ng/mL) Amphetamine      1000 Barbiturate      200 Benzodiazepine   528 Tricyclics       413 Opiates          300 Cocaine          300 THC  50   Ethanol     Status: None   Collection Time: 07/15/17  7:32 PM  Result Value Ref Range   Alcohol, Ethyl (B) <10 <10 mg/dL    Comment:        LOWEST DETECTABLE LIMIT FOR SERUM ALCOHOL IS 10 mg/dL FOR MEDICAL PURPOSES ONLY     Current Facility-Administered Medications  Medication Dose Route Frequency Provider Last Rate Last Dose  . acetaminophen (TYLENOL) tablet 650 mg  650 mg Oral Q4H PRN Robinson, [place] N, PA-C      . alum & mag hydroxide-simeth (MAALOX/MYLANTA) 200-200-20 MG/5ML suspension 30 mL  30 mL Oral Q6H PRN Robinson, [place] N, PA-C      . ARIPiprazole (ABILIFY) tablet 15 mg  15 mg Oral Daily Robinson, [place] N, PA-C   15 mg at 07/16/17 1036  . FLUoxetine (PROZAC) capsule 20 mg  20 mg Oral Daily Robinson, [place] N, PA-C   20 mg at 07/16/17 1037  . hydrOXYzine (ATARAX/VISTARIL) tablet 50 mg  50 mg Oral Q6H PRN Robinson, [place] N, PA-C      . ondansetron [facility]) tablet 4 mg  4 mg Oral Q8H PRN Robinson, [place] N, PA-C      . traZODone (DESYREL) tablet 100 mg  100 mg Oral QHS Robinson, [place] N, PA-C   100 mg at 07/15/17 2147   Current Outpatient Medications  Medication Sig Dispense Refill  . ARIPiprazole (ABILIFY) 15 MG tablet Take 1 tablet (15 mg total) by mouth daily. For mood control 30 tablet 0  . FLUoxetine (PROZAC) 20 MG capsule Take 1 capsule (20 mg total) by mouth daily. For depression 30 capsule 0  . hydrOXYzine (ATARAX/VISTARIL) 50 MG tablet Take 1 tablet (50 mg total) by mouth every 6 (six) hours as needed for anxiety. 30 tablet 0  . traZODone (DESYREL) 100 MG tablet Take 1 tablet (100  mg) by mouth at bedtime: For sleep 30 tablet 0  . escitalopram (LEXAPRO) 20 MG tablet Take 20 mg by mouth daily.  0  . hydrOXYzine (ATARAX/VISTARIL) 25 MG tablet Take 25 mg by mouth every 6 (six) hours as needed. for anxiety  0  . traZODone (DESYREL) 50 MG tablet Take 50 mg by mouth at bedtime as needed. for sleep  0    Musculoskeletal: Strength & Muscle Tone: within normal limits Gait & Station: normal Patient leans: N/A  Psychiatric Specialty Exam: Physical Exam  Constitutional: He appears well-developed and well-nourished.  HENT:  Head: Normocephalic.  Respiratory: Effort normal.  Musculoskeletal: Normal range of motion.  Neurological: He is alert.  Psychiatric: His speech is normal. Thought content normal. He is agitated. Cognition and memory are impaired. He expresses impulsivity. He exhibits a depressed mood.    Review of Systems  Psychiatric/Behavioral: Positive for depression. Negative for hallucinations, memory loss, substance abuse and suicidal ideas. The patient is not nervous/anxious and does not have insomnia.   All other systems reviewed and are negative.   Blood pressure 114/68, pulse 70, temperature 97.7 F (36.5 C), temperature source Oral, resp. rate 12, SpO2 99 %.There is no height or weight on file to calculate BMI.  General Appearance: Disheveled  Eye Contact:  Good  Speech:  Normal Rate  Volume:  Normal  Mood:  Euthymic  Affect:  Congruent  Thought Process:  Coherent  Orientation:  Full (Time, Place, and Person)  Thought Content:  Illogical  Suicidal Thoughts:  No  Homicidal Thoughts:  No  Memory:  Immediate;   Good Recent;   Fair  Remote;   Fair  Judgement:  Poor  Insight:  Lacking  Psychomotor Activity:  Normal  Concentration:  Concentration: Fair and Attention Span: Fair  Recall:  [employer] of Knowledge:  Fair  Language:  Good  Akathisia:  No  Handed:  Right  AIMS (if indicated):   N/A  Assets:  Sales promotion account executive Housing  ADL's:  Intact  Cognition:  WNL  Sleep:   N/A     Treatment Plan Summary: Plan Schizoaffective disorder [facility])  Discharge Home Take all medications as prescribed Avoid the use of alcohol and illicit drugs Follow [facility] with [facility]   Disposition: No evidence of imminent risk to self or others at present.   Patient does not meet criteria for psychiatric inpatient admission. Supportive therapy provided about ongoing stressors.  Ethelene Hal, NP 07/16/2017 1:13 PM   Patient seen face-to-face for psychiatric evaluation, chart reviewed and case discussed with the physician extender and developed treatment plan. Reviewed the information documented and agree with the treatment plan.  Buford Dresser, DO

## 2017-07-16 NOTE — [facility] Suicide Risk Assessment (Signed)
Suicide Risk Assessment  Discharge Assessment   [facility]   Principal Problem: Schizoaffective disorder [facility](HCC) Discharge Diagnoses:  Patient Active Problem List   Diagnosis Date Noted  . Schizoaffective disorder (HCC) [F25.9] 07/06/2017  . 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 Stuttering [F80.81] 08/09/2015  . Cannabis use disorder, moderate, dependence ([facility]CC) [F12.20] 08/09/2015  . MDD (major depressive disorder), recurrent, severe, with psychosis (HCC) [F33.3] 02/16/2013    Total Time spent with patient: 45 minutes  Musculoskeletal: Strength & Muscle Tone: within normal limits Gait & Station: normal Patient leans: N/A    Psychiatric Specialty Exam: Physical Exam  Constitutional: He appears well-developed and well-nourished.  HENT:  Head: Normocephalic.  Respiratory: Effort normal.  Musculoskeletal: Normal range of motion.  Neurological: He is alert.  Psychiatric: His speech is normal. Thought content normal. He is agitated. Cognition and memory are impaired. He expresses impulsivity. He exhibits a depressed mood.   Review of Systems  Psychiatric/Behavioral: Positive for depression. Negative for hallucinations, memory loss, substance abuse and suicidal ideas. The patient is not nervous/anxious and does not have insomnia.   All other systems reviewed and are negative.  Blood pressure 114/68, pulse 70, temperature 97.7 F (36.5 C), temperature source Oral, resp. rate 12, SpO2 99 %.There is no height or weight on file to calculate BMI. General Appearance: Disheveled Eye Contact:  Good Speech:  Normal Rate Volume:  Normal Mood:  Euthymic Affect:  Congruent Thought Process:  Coherent Orientation:  Full (Time, Place, and Person) Thought Content:  Illogical Suicidal Thoughts:  No Homicidal Thoughts:  No Memory:  Immediate;    Good Recent;   Fair Remote;   Fair Judgement:  Poor Insight:  Lacking Psychomotor Activity:  Normal Concentration:  Concentration: Fair and Attention Span: Fair Recall:  [employer] of Knowledge:  Fair Language:  Good Akathisia:  No Handed:  Right AIMS (if indicated):    Assets:  ArchitectCommunication Skills Financial Resources/Insurance Housing ADL's:  Intact Cognition:  WNL   Mental Status Per Nursing Assessment::   On Admission:   Suicidal ideation  Demographic Factors:  Male, Adolescent or young adult and Low socioeconomic status  Loss Factors: Financial problems/change in socioeconomic status  Historical Factors: Impulsivity  Risk Reduction Factors:   Sense of responsibility to family and Living with another person, especially a relative  Continued Clinical Symptoms:  Depression:   Impulsivity  Cognitive Features That Contribute To Risk:  Closed-mindedness    Suicide Risk:  Minimal: No identifiable suicidal ideation.  Patients presenting with no risk factors but with morbid ruminations; may be classified as minimal risk based on the severity of the depressive symptoms    Plan Of Care/Follow-up recommendations:  Activity:  as tolerated Diet:  Heart Healthy  Laveda AbbeLaurie Britton Parks, NP 07/16/2017, 1:25 PM

## 2017-07-16 NOTE — ED Notes (Signed)
Bed: WA31 Expected date:  Expected time:  Means of arrival:  Comments: 

## 2017-08-17 ENCOUNTER — Encounter (HOSPITAL_COMMUNITY): Payer: Self-pay | Admitting: Nurse Practitioner

## 2017-08-17 ENCOUNTER — Emergency Department (HOSPITAL_COMMUNITY)
Admission: EM | Admit: 2017-08-17 | Discharge: 2017-08-19 | Disposition: A | Discharge status: Home / Self Care | Payer: Medicaid Other | Attending: Emergency Medicine | Admitting: Emergency Medicine

## 2017-08-17 ENCOUNTER — Ambulatory Visit (HOSPITAL_COMMUNITY)
Admission: RE | Admit: 2017-08-17 | Discharge: 2017-08-17 | Disposition: A | Discharge status: Home / Self Care | Payer: Medicaid Other | Attending: Psychiatry | Admitting: Psychiatry

## 2017-08-17 DIAGNOSIS — F17200 Nicotine dependence, unspecified, uncomplicated: Secondary | ICD-10-CM | POA: Diagnosis not present

## 2017-08-17 DIAGNOSIS — Z046 Encounter for general psychiatric examination, requested by authority: Secondary | ICD-10-CM | POA: Diagnosis not present

## 2017-08-17 DIAGNOSIS — F332 Major depressive disorder, recurrent severe without psychotic features: Secondary | ICD-10-CM

## 2017-08-17 DIAGNOSIS — Z79899 Other long term (current) drug therapy: Secondary | ICD-10-CM | POA: Insufficient documentation

## 2017-08-17 DIAGNOSIS — F419 Anxiety disorder, unspecified: Secondary | ICD-10-CM | POA: Diagnosis not present

## 2017-08-17 DIAGNOSIS — F17210 Nicotine dependence, cigarettes, uncomplicated: Secondary | ICD-10-CM | POA: Diagnosis not present

## 2017-08-17 DIAGNOSIS — F329 Major depressive disorder, single episode, unspecified: Secondary | ICD-10-CM | POA: Diagnosis present

## 2017-08-17 DIAGNOSIS — R110 Nausea: Secondary | ICD-10-CM | POA: Diagnosis not present

## 2017-08-17 DIAGNOSIS — R45851 Suicidal ideations: Secondary | ICD-10-CM | POA: Insufficient documentation

## 2017-08-17 DIAGNOSIS — R1032 Left lower quadrant pain: Secondary | ICD-10-CM | POA: Insufficient documentation

## 2017-08-17 DIAGNOSIS — R109 Unspecified abdominal pain: Secondary | ICD-10-CM | POA: Diagnosis not present

## 2017-08-17 DIAGNOSIS — F333 Major depressive disorder, recurrent, severe with psychotic symptoms: Secondary | ICD-10-CM | POA: Insufficient documentation

## 2017-08-17 LAB — COMPREHENSIVE METABOLIC PANEL
ALK PHOS: 60 U/L (ref 38–126)
ALT: 28 U/L (ref 17–63)
ANION GAP: 8 (ref 5–15)
AST: 22 U/L (ref 15–41)
Albumin: 4.4 g/dL (ref 3.5–5.0)
BILIRUBIN TOTAL: 1 mg/dL (ref 0.3–1.2)
BUN: 10 mg/dL (ref 6–20)
CALCIUM: 9.9 mg/dL (ref 8.9–10.3)
CO2: 27 mmol/L (ref 22–32)
CREATININE: 1.07 mg/dL (ref 0.61–1.24)
Chloride: 106 mmol/L (ref 101–111)
Glucose, Bld: 102 mg/dL — ABNORMAL HIGH (ref 65–99)
Potassium: 3.5 mmol/L (ref 3.5–5.1)
Sodium: 141 mmol/L (ref 135–145)
TOTAL PROTEIN: 7.7 g/dL (ref 6.5–8.1)

## 2017-08-17 LAB — ETHANOL

## 2017-08-17 LAB — RAPID URINE DRUG SCREEN, HOSP PERFORMED
Amphetamines: NOT DETECTED
Barbiturates: NOT DETECTED
Benzodiazepines: NOT DETECTED
Cocaine: NOT DETECTED
OPIATES: NOT DETECTED
Tetrahydrocannabinol: NOT DETECTED

## 2017-08-17 LAB — SALICYLATE LEVEL

## 2017-08-17 LAB — CBC
HCT: 40 % (ref 39.0–52.0)
Hemoglobin: 13.9 g/dL (ref 13.0–17.0)
MCH: 29.1 pg (ref 26.0–34.0)
MCHC: 34.8 g/dL (ref 30.0–36.0)
MCV: 83.7 fL (ref 78.0–100.0)
PLATELETS: 277 10*3/uL (ref 150–400)
RBC: 4.78 MIL/uL (ref 4.22–5.81)
RDW: 12.6 % (ref 11.5–15.5)
WBC: 5.2 10*3/uL (ref 4.0–10.5)

## 2017-08-17 LAB — ACETAMINOPHEN LEVEL

## 2017-08-17 MED ORDER — ACETAMINOPHEN 325 MG PO TABS: 650.0000 mg | ORAL_TABLET | ORAL | Status: DC | PRN

## 2017-08-17 MED ORDER — ESCITALOPRAM OXALATE 10 MG PO TABS
20.0000 mg | ORAL_TABLET | Freq: Every day | ORAL | Status: DC
  Administered 2017-08-18 – 2017-08-19 (×3): 20 mg via ORAL
  Filled 2017-08-17 (×3): qty 2

## 2017-08-17 MED ORDER — ZOLPIDEM TARTRATE 5 MG PO TABS
5.0000 mg | ORAL_TABLET | Freq: Every evening | ORAL | Status: DC | PRN
  Filled 2017-08-17: qty 1

## 2017-08-17 MED ORDER — TRAZODONE HCL 100 MG PO TABS
100.0000 mg | ORAL_TABLET | Freq: Every day | ORAL | Status: DC
  Administered 2017-08-18 (×2): 100 mg via ORAL
  Filled 2017-08-17 (×2): qty 1

## 2017-08-17 MED ORDER — ARIPIPRAZOLE 5 MG PO TABS
15.0000 mg | ORAL_TABLET | Freq: Every day | ORAL | Status: DC
  Administered 2017-08-18 – 2017-08-19 (×3): 15 mg via ORAL
  Filled 2017-08-17 (×3): qty 1

## 2017-08-17 MED ORDER — ZIPRASIDONE MESYLATE 20 MG IM SOLR: 20.0000 mg | Freq: Two times a day (BID) | INTRAMUSCULAR | Status: DC | PRN

## 2017-08-17 MED ORDER — ONDANSETRON HCL 4 MG PO TABS: 4.0000 mg | ORAL_TABLET | Freq: Three times a day (TID) | ORAL | Status: DC | PRN

## 2017-08-17 MED ORDER — HYDROXYZINE HCL 25 MG PO TABS: 25.0000 mg | ORAL_TABLET | Freq: Four times a day (QID) | ORAL | Status: DC | PRN

## 2017-08-17 MED ORDER — LORAZEPAM 1 MG PO TABS
1.0000 mg | ORAL_TABLET | ORAL | Status: AC | PRN
  Administered 2017-08-18: 1 mg via ORAL
  Filled 2017-08-17: qty 1

## 2017-08-17 NOTE — ED Notes (Signed)
Bed: [facility] Expected date:  Expected time:  Means of arrival:  Comments: 

## 2017-08-17 NOTE — BH Assessment (Signed)
Tele Assessment Note   Patient Name: Marvin Crawford MRN: [id number] Referring Physician: Derwood KaplanNanavati, Ankit, MD [facility] Location of Provider: Behavioral Health TTS Department  Marvin Crawford is an 22 y.o. male present to [facility] with his mother complaints of suicidal ideations with a plan of splitting his stomach triggered by having no medication Abilify and Trazodone. Patient's mother's report patient was attending [facility] for medication management and therapy services. [facility] discharged patient from the ACTT Team and [facility]. When discharged patient was not provided prescription for Abilify and Trazodone. Patient is scheduled to see a new psychiatrist with [facility] August 29, 2017, however, he does not have enough medication that will last him until his appointment.  Patient report suicidal ideations due to no medication. Patient denies homicidal ideations, auditory and visual hallucinations.   Per EDP note Nanavati, MD, Pt comes in with cc of depression. Pt here with his mother. Pt has hx of ADD, depression, allergies. Per mother pt has been feeling depressed since September. Lately he has gotten worse and has been talking about slicing himself and hurting himself. Pt has been taking his meds as prescribed. He did miss his Abilify IM in Dec due to snow.      Diagnosis:  Schizoaffective Disorder  Disposition: Per Barbara CowerJason, NP, observe and monitor patient overnight for safety and security. Re-evaluate in the morning    Past Medical History:  Past Medical History:  Diagnosis Date  . ADD (attention deficit disorder)   . Anxiety   . Depression   . Environmental allergies     History reviewed. No pertinent surgical history.  Family History:  Family History  Problem Relation Age of Onset  . Hypertension Mother   . Hypertension Father   . Mental illness Neg Hx     Social History:  reports that he has been smoking.  He has  been smoking about 2.00 packs per day. he has never used smokeless tobacco. He reports that he uses drugs. Drugs: Other-see comments and Marijuana. He reports that he does not drink alcohol.  Additional Social History:  Alcohol / Drug Use Pain Medications: see MAR Prescriptions: see MAR Over the Counter: see MAR History of alcohol / drug use?: Yes Substance #1 Name of Substance 1: marijuana 1 - Age of First Use: unable to assess 1 - Amount (size/oz): unable to assess 1 - Frequency: unable to assess 1 - Duration: unable to assess 1 - Last Use / Amount: September 2018  CIWA: CIWA-Ar BP: 126/83 Pulse Rate: 100 COWS:    PATIENT STRENGTHS: (choose at least two) Average or above average intelligence Physical Health  Allergies: No Known Allergies  Home Medications:  (Not in a hospital admission)  OB/GYN Status:  No LMP for male patient.  General Assessment Data Location of Assessment: [facility] TTS Assessment: In system Is this a Tele or Face-to-Face Assessment?: Face-to-Face Is this an Initial Assessment or a Re-assessment for this encounter?: Initial Assessment Marital status: Single Maiden name: n/a Is patient pregnant?: No Pregnancy Status: No Living Arrangements: Parent, Other relatives Can pt return to current living arrangement?: Yes Admission Status: Voluntary Is patient capable of signing voluntary admission?: Yes Referral Source: Self/Family/Friend Insurance type: Medicaid     Crisis Care Plan Living Arrangements: Parent, Other relatives Name of Psychiatrist: [facility](schedule to start seeing [facility] 1/23/2) Name of Therapist: [facility](scheduled to start seeing [facility] 1/23/)  Education Status Is patient currently  in school?: No Highest grade of school patient has completed: 42 Name of school: [employer]  Risk to self with the past 6 months Suicidal Ideation: Yes-Currently Present Has patient been a risk to  self within the past 6 months prior to admission? : No Suicidal Intent: No Has patient had any suicidal intent within the past 6 months prior to admission? : Yes Is patient at risk for suicide?: No Suicidal Plan?: No Has patient had any suicidal plan within the past 6 months prior to admission? : Yes Specify Current Suicidal Plan: no current suicidal plan, only feelings Access to Means: No Specify Access to Suicidal Means: n/a What has been your use of drugs/alcohol within the last 12 months?: patient denies Previous Attempts/Gestures: Yes How many times?: 3 Other Self Harm Risks: n/a Triggers for Past Attempts: Unknown Intentional Self Injurious Behavior: None Family Suicide History: No Recent stressful life event(s): (out of medication ) Persecutory voices/beliefs?: No Depression: Yes Depression Symptoms: Tearfulness Substance abuse history and/or treatment for substance abuse?: No Suicide prevention information given to non-admitted patients: Not applicable  Risk to Others within the past 6 months Homicidal Ideation: No Does patient have any lifetime risk of violence toward others beyond the six months prior to admission? : No Thoughts of Harm to Others: No Current Homicidal Intent: No Current Homicidal Plan: No Access to Homicidal Means: No Identified Victim: n/a History of harm to others?: No Assessment of Violence: None Noted Violent Behavior Description: per note, pt has hx of violence Does patient have access to weapons?: No Criminal Charges Pending?: No Does patient have a court date: No Is patient on probation?: No  Psychosis Hallucinations: None noted(patient denies) Delusions: None noted  Mental Status Report Appearance/Hygiene: Unremarkable, In scrubs Eye Contact: Fair Motor Activity: Freedom of movement Speech: Soft, Slow Level of Consciousness: Alert, Quiet/awake Mood: Pleasant Affect: Appropriate to circumstance Anxiety Level: Minimal Thought  Processes: Coherent, Relevant Judgement: Partial(suicidal thoughts due to no medication ) Orientation: Person, Place Obsessive Compulsive Thoughts/Behaviors: None  Cognitive Functioning Concentration: Decreased Memory: Recent Impaired, Remote Impaired IQ: Average Insight: Poor Impulse Control: Poor Appetite: Fair Sleep: Decreased(hard time falling asleep) Vegetative Symptoms: None  ADLScreening [facility] Assessment Services) Patient's cognitive ability adequate to safely complete daily activities?: Yes Patient able to express need for assistance with ADLs?: Yes Independently performs ADLs?: Yes (appropriate for developmental age)  Prior Inpatient Therapy Prior Inpatient Therapy: Yes Prior Therapy Dates: 2014 - 2018 Prior Therapy Facilty/Provider(s): [facility], [facility] Reason for Treatment: SI, schizophrenia  Prior Outpatient Therapy Prior Outpatient Therapy: Yes Prior Therapy Dates: currently Prior Therapy Facilty/Provider(s): [facility] Reason for Treatment: schizophrenia med management Does patient have an ACCT team?: Yes Does patient have Intensive In-House Services?  : No Does patient have [facility] services? : No Does patient have [facility] services?: No  ADL Screening (condition at time of admission) Patient's cognitive ability adequate to safely complete daily activities?: Yes Is the patient deaf or have difficulty hearing?: No Does the patient have difficulty seeing, even when wearing glasses/contacts?: No Does the patient have difficulty concentrating, remembering, or making decisions?: No Patient able to express need for assistance with ADLs?: Yes Does the patient have difficulty dressing or bathing?: No Independently performs ADLs?: Yes (appropriate for developmental age) Does the patient have difficulty walking or climbing stairs?: No Weakness of Legs: None Weakness of Arms/Hands: None       Abuse/Neglect Assessment (Assessment to be complete while  patient is alone) Physical Abuse: Denies Verbal Abuse:  Denies Sexual Abuse: Denies Exploitation of patient/patient's resources: Denies Self-Neglect: Denies     [employer]chant [employer] officer (For Healthcare) Does Patient Have a Medical Advance Directive?: No Would patient like information on creating a medical advance directive?: No - Patient declined    Additional Information 1:1 In Past 12 Months?: No CIRT Risk: No Elopement Risk: No Does patient have medical clearance?: Yes     Disposition: Per Barbara Cower, NP, observe and monitor patient overnight for safety and security. Re-evaluate in the morning  Disposition Initial Assessment Completed for this Encounter: Yes Disposition of Patient: Inpatient treatment program Type of inpatient treatment program: Adult   Dian Situ 08/17/2017 10:30 PM

## 2017-08-17 NOTE — ED Notes (Signed)
Bed: WA29 Expected date:  Expected time:  Means of arrival:  Comments: Triage 4  

## 2017-08-17 NOTE — ED Triage Notes (Signed)
Pt states he is having suicidal thoughts and feeling really anxious. He reports compliance with his medication and unsure of the trigger.

## 2017-08-17 NOTE — ED Provider Notes (Signed)
[facility]-EMERGENCY DEPT Provider Note   CSN: [id number] Arrival date & time: 08/17/17  1811     History   Chief Complaint Chief Complaint  Patient presents with  . Psychiatric Evaluation    HPI Marvin Crawford is a 22 y.o. male.  HPI  Pt comes in with cc of depression. Pt here with his mother. Pt has hx of ADD, depression, allergies. Per mother pt has been feeling depressed since September. Lately he has gotten worse and has been talking about slicing himself and hurting himself. Pt has been taking his meds as prescribed. He did miss his Abilify IM in Dec due to snow.   ROS + for abd pain and nausea.   Past Medical History:  Diagnosis Date  . ADD (attention deficit disorder)   . Anxiety   . Depression   . Environmental allergies     Patient Active Problem List   Diagnosis Date Noted  . Schizoaffective disorder (HCC) 07/06/2017  . Major depressive disorder, recurrent, severe with psychotic features (HCC) 07/05/2017  . 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 Stuttering 08/09/2015  . Cannabis use disorder, moderate, dependence (HCC) 08/09/2015  . MDD (major depressive disorder), recurrent, severe, with psychosis (HCC) 02/16/2013    History reviewed. No pertinent surgical history.     Home Medications    Prior to Admission medications   Medication Sig Start Date End Date Taking? Authorizing Provider  ARIPiprazole (ABILIFY) 15 MG tablet Take 1 tablet (15 mg total) by mouth daily. For mood control 07/11/17   Armandina StammerNwoko, Agnes I, NP  escitalopram (LEXAPRO) 20 MG tablet Take 20 mg by mouth daily. 05/08/17   [provider]  FLUoxetine (PROZAC) 20 MG capsule Take 1 capsule (20 mg total) by mouth daily. For depression 07/11/17   Armandina StammerNwoko, Agnes I, NP  hydrOXYzine (ATARAX/VISTARIL) 25 MG tablet Take 25 mg by mouth every 6 (six) hours as needed. for anxiety 05/15/17   [provider]  hydrOXYzine (ATARAX/VISTARIL) 50 MG tablet Take 1  tablet (50 mg total) by mouth every 6 (six) hours as needed for anxiety. 07/10/17   Armandina StammerNwoko, Agnes I, NP  traZODone (DESYREL) 100 MG tablet Take 1 tablet (100 mg) by mouth at bedtime: For sleep 07/10/17   Armandina StammerNwoko, Agnes I, NP  traZODone (DESYREL) 50 MG tablet Take 50 mg by mouth at bedtime as needed. for sleep 05/15/17   [provider]    Family History Family History  Problem Relation Age of Onset  . Hypertension Mother   . Hypertension Father   . Mental illness Neg Hx     Social History Social History   Tobacco Use  . Smoking status: Current Every Day Smoker    Packs/day: 2.00  . Smokeless tobacco: Never Used  Substance Use Topics  . Alcohol use: No    Alcohol/week: 0.0 oz  . Drug use: Yes    Types: Other-see comments, Marijuana    Comment: oil smoked in pipe calls "D[employer]", friends buy for him     Allergies   Patient has no known allergies.   Review of Systems Review of Systems  Gastrointestinal: Positive for abdominal pain and nausea.  All other systems reviewed and are negative.    Physical Exam Updated Vital Signs BP 126/83 (BP Location: Left Arm)   Pulse 100   Temp 98.3 F (36.8 C) (Oral)   Resp 18   SpO2 100%   Physical Exam  Constitutional: He is oriented to person, place, and time. He  appears well-developed.  HENT:  Head: Atraumatic.  Neck: Neck supple.  Cardiovascular: Normal rate.  Pulmonary/Chest: Effort normal.  Abdominal: Soft. There is tenderness.  LLQ tenderness, no rebound  Neurological: He is alert and oriented to person, place, and time. No cranial nerve deficit.  Skin: Skin is warm.  Nursing note and vitals reviewed.    ED Treatments / Results  Labs (all labs ordered are listed, but only abnormal results are displayed) Labs Reviewed  COMPREHENSIVE METABOLIC PANEL - Abnormal; Notable for the following components:      Result Value   Glucose, Bld 102 (*)    All other components within normal limits  ACETAMINOPHEN LEVEL -  Abnormal; Notable for the following components:   Acetaminophen (Tylenol), Serum <10 (*)    All other components within normal limits  ETHANOL  SALICYLATE LEVEL  CBC  RAPID URINE DRUG SCREEN, HOSP PERFORMED    EKG  EKG Interpretation None       Radiology No results found.  Procedures Procedures (including critical care time)  Medications Ordered in ED Medications  ziprasidone (GEODON) injection 20 mg (not administered)    And  LORazepam (ATIVAN) tablet 1 mg (not administered)  acetaminophen (TYLENOL) tablet 650 mg (not administered)  ondansetron (ZOFRAN) tablet 4 mg (not administered)  zolpidem (AMBIEN) tablet 5 mg (not administered)  ARIPiprazole (ABILIFY) tablet 15 mg (not administered)  escitalopram (LEXAPRO) tablet 20 mg (not administered)  hydrOXYzine (ATARAX/VISTARIL) tablet 25 mg (not administered)  traZODone (DESYREL) tablet 100 mg (not administered)     Initial Impression / Assessment and Plan / ED Course  I have reviewed the triage vital signs and the nursing notes.  Pertinent labs & imaging results that were available during my care of the patient were reviewed by me and considered in my medical decision making (see chart for details).     Pt comes in with cc of worsening depression. Pt is now having suicidal thoughts.  Patient is medically cleared at this time.  He is having some left lower quadrant tenderness, and nausea.  My suspicion for diverticulitis is low.  If patient's white count is severely elevated then we will give patient antibiotics for clinical diverticulitis.  Final Clinical Impressions(s) / ED Diagnoses   Final diagnoses:  Severe episode of recurrent major depressive disorder, without psychotic features [facility])    ED Discharge Orders    None       Derwood Kaplan, MD 08/17/17 2144

## 2017-08-18 DIAGNOSIS — R45851 Suicidal ideations: Secondary | ICD-10-CM

## 2017-08-18 DIAGNOSIS — R109 Unspecified abdominal pain: Secondary | ICD-10-CM

## 2017-08-18 DIAGNOSIS — F1210 Cannabis abuse, uncomplicated: Secondary | ICD-10-CM

## 2017-08-18 DIAGNOSIS — F333 Major depressive disorder, recurrent, severe with psychotic symptoms: Secondary | ICD-10-CM

## 2017-08-18 DIAGNOSIS — F419 Anxiety disorder, unspecified: Secondary | ICD-10-CM

## 2017-08-18 DIAGNOSIS — F17210 Nicotine dependence, cigarettes, uncomplicated: Secondary | ICD-10-CM

## 2017-08-18 MED ORDER — ARIPIPRAZOLE ER 400 MG IM SRER
1.0000 | Freq: Once | INTRAMUSCULAR | Status: AC
  Administered 2017-08-19: 400 mg via INTRAMUSCULAR
  Filled 2017-08-18: qty 400

## 2017-08-18 NOTE — ED Notes (Signed)
SBAR Report received from previous nurse. Pt received calm and visible on unit. Pt denies current SI/ HI, A/V H, depression, anxiety, or pain at this time, and appears otherwise stable and free of distress. Pt reminded of camera surveillance, q 15 min rounds, and rules of the milieu. Will continue to assess. 

## 2017-08-18 NOTE — Consult Note (Signed)
[facility] Face-to-Face Psychiatry Consult   Reason for Consult:  Suicidal Ideation Referring Physician:  Emergency Room Physician Patient Identification: Marvin Crawford MRN:  [id number] Principal Diagnosis: MDD (major depressive disorder), recurrent, severe, with psychosis ([place]) Diagnosis:   Patient Active Problem List   Diagnosis Date Noted  . MDD (major depressive disorder), recurrent, severe, with psychosis ([place]) [F33.3] 02/16/2013    Priority: High  . Schizoaffective disorder ([place]) [F25.9] 07/06/2017  . Major depressive disorder, recurrent, severe with psychotic features ([place]) [F33.3] 07/05/2017  . 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 Stuttering [F80.81] 08/09/2015  . Cannabis use disorder, moderate, dependence ([facility]) [F12.20] 08/09/2015    Total Time spent with patient: 45 minutes  HPI:   Patient is a 22 y.o. male patient admitted to the ER following verbalization of suicidal ideation today. Patient expresses that he has had recently increasing abdominal pains that he feels are from missing his Abilify medication. Patient notes that he normally gets a monthly Abilify injection and a prescription of Trazodone from [facility]. Patient notes that he missed his December dose and now is quite concerned that he will miss his January dose. Patient is requesting that the ER help him to get his January dose of medication. Patient points out that when his medication is at appropriate levels he does not feels this way and feels that with medication his feelings of suicidal ideation will go away. Patient also notes that he just needs something to get him to his appointment with [facility] 29 August 2017.  Past Psychiatric History:  Depression, schizoaffective disorder  Risk to Self: Suicidal Ideation: Yes-Currently Present Suicidal Intent: No Is patient at risk for suicide?: No Suicidal Plan?: No Specify Current Suicidal Plan: no current suicidal plan, only  feelings Access to Means: No Specify Access to Suicidal Means: n/a What has been your use of drugs/alcohol within the last 12 months?: patient denies How many times?: 3 Other Self Harm Risks: n/a Triggers for Past Attempts: Unknown Intentional Self Injurious Behavior: None Risk to Others: Homicidal Ideation: No Thoughts of Harm to Others: No Current Homicidal Intent: No Current Homicidal Plan: No Access to Homicidal Means: No Identified Victim: n/a History of harm to others?: No Assessment of Violence: None Noted Violent Behavior Description: per note, pt has hx of violence Does patient have access to weapons?: No Criminal Charges Pending?: No Does patient have a court date: No Prior Inpatient Therapy: Prior Inpatient Therapy: Yes Prior Therapy Dates: 2014 - 2018 Prior Therapy Facilty/Provider(s): [facility], [facility] Reason for Treatment: SI, schizophrenia Prior Outpatient Therapy: Prior Outpatient Therapy: Yes Prior Therapy Dates: currently Prior Therapy [facility]ilty[facility]Provider[facility]): [facility] Reason for Treatment: schizophrenia med management Does patient have an ACCT team?: Yes Does patient have Intensive In-House Services?  : No Does patient have [facility] services? : No Does patient have P4CC services?: No  Past Medical History:  Past Medical History:  Diagnosis Date  . ADD (attention deficit disorder)   . Anxiety   . Depression   . Environmental allergies    History reviewed. No pertinent surgical history. Family History:  Family History  Problem Relation Age of Onset  . Hypertension Mother   . Hypertension Father   . Mental illness Neg Hx    Social History:  Social History   Substance and Sexual Activity  Alcohol Use No  . Alcohol/week: 0.0 oz     Social History   Substance and Sexual Activity  Drug Use Yes  . Types: Other-see comments, Marijuana  Comment: oil smoked in pipe calls "DAB", friends buy for him    Social History   Socioeconomic  History  . Marital status: Single    Spouse name: None  . Number of children: None  . Years of education: None  . Highest education level: None  Social Needs  . Financial resource strain: None  . Food insecurity - worry: None  . Food insecurity - inability: None  . Transportation needs - medical: None  . Transportation needs - non-medical: None  Occupational History  . None  Tobacco Use  . Smoking status: Current Every Day Smoker    Packs/day: 2.00  . Smokeless tobacco: Never Used  Substance and Sexual Activity  . Alcohol use: No    Alcohol/week: 0.0 oz  . Drug use: Yes    Types: Other-see comments, Marijuana    Comment: oil smoked in pipe calls "DAB", friends buy for him  . Sexual activity: Yes    Birth control/protection: Condom  Other Topics Concern  . None  Social History Narrative  . None   Allergies:  No Known Allergies  Labs:  Results for orders placed or performed during the hospital encounter of 08/17/17 (from the past 48 hour(s))  Comprehensive metabolic panel     Status: Abnormal   Collection Time: 08/17/17  6:44 PM  Result Value Ref Range   Sodium 141 135 - 145 mmol/L   Potassium 3.5 3.5 - 5.1 mmol/L   Chloride 106 101 - 111 mmol/L   CO2 27 22 - 32 mmol/L   Glucose, Bld 102 (H) 65 - 99 mg/dL   BUN 10 6 - 20 mg/dL   Creatinine, Ser 1.07 0.61 - 1.24 mg/dL   Calcium 9.9 8.9 - 10.3 mg/dL   Total Protein 7.7 6.5 - 8.1 g/dL   Albumin 4.4 3.5 - 5.0 g/dL   AST 22 15 - 41 U/L   ALT 28 17 - 63 U/L   Alkaline Phosphatase 60 38 - 126 U/L   Total Bilirubin 1.0 0.3 - 1.2 mg/dL   GFR calc non Af Amer >60 >60 mL/min   GFR calc Af Amer >60 >60 mL/min    Comment: (NOTE) The eGFR has been calculated using the CKD EPI equation. This calculation has not been validated in all clinical situations. eGFR's persistently <60 mL/min signify possible Chronic Kidney Disease.    Anion gap 8 5 - 15  Ethanol     Status: None   Collection Time: 08/17/17  6:44 PM  Result Value  Ref Range   Alcohol, Ethyl (B) <10 <10 mg/dL    Comment:        LOWEST DETECTABLE LIMIT FOR SERUM ALCOHOL IS 10 mg/dL FOR MEDICAL PURPOSES ONLY   Salicylate level     Status: None   Collection Time: 08/17/17  6:44 PM  Result Value Ref Range   Salicylate Lvl <3.2 2.8 - 30.0 mg/dL  Acetaminophen level     Status: Abnormal   Collection Time: 08/17/17  6:44 PM  Result Value Ref Range   Acetaminophen (Tylenol), Serum <10 (L) 10 - 30 ug/mL    Comment:        THERAPEUTIC CONCENTRATIONS VARY SIGNIFICANTLY. A RANGE OF 10-30 ug/mL MAY BE AN EFFECTIVE CONCENTRATION FOR MANY PATIENTS. HOWEVER, SOME ARE BEST TREATED AT CONCENTRATIONS OUTSIDE THIS RANGE. ACETAMINOPHEN CONCENTRATIONS >150 ug/mL AT 4 HOURS AFTER INGESTION AND >50 ug/mL AT 12 HOURS AFTER INGESTION ARE OFTEN ASSOCIATED WITH TOXIC REACTIONS.   cbc     Status:  None   Collection Time: 08/17/17  6:44 PM  Result Value Ref Range   WBC 5.2 4.0 - 10.5 K/uL   RBC 4.78 4.22 - 5.81 MIL/uL   Hemoglobin 13.9 13.0 - 17.0 g/dL   HCT 40.0 39.0 - 52.0 %   MCV 83.7 78.0 - 100.0 fL   MCH 29.1 26.0 - 34.0 pg   MCHC 34.8 30.0 - 36.0 g/dL   RDW 12.6 11.5 - 15.5 %   Platelets 277 150 - 400 K/uL  Rapid urine drug screen (hospital performed)     Status: None   Collection Time: 08/17/17  8:26 PM  Result Value Ref Range   Opiates NONE DETECTED NONE DETECTED   Cocaine NONE DETECTED NONE DETECTED   Benzodiazepines NONE DETECTED NONE DETECTED   Amphetamines NONE DETECTED NONE DETECTED   Tetrahydrocannabinol NONE DETECTED NONE DETECTED   Barbiturates NONE DETECTED NONE DETECTED    Comment: (NOTE) DRUG SCREEN FOR MEDICAL PURPOSES ONLY.  IF CONFIRMATION IS NEEDED FOR ANY PURPOSE, NOTIFY LAB WITHIN 5 DAYS. LOWEST DETECTABLE LIMITS FOR URINE DRUG SCREEN Drug Class                     Cutoff (ng/mL) Amphetamine and metabolites    1000 Barbiturate and metabolites    200 Benzodiazepine                 981 Tricyclics and metabolites      300 Opiates and metabolites        300 Cocaine and metabolites        300 THC                            50     Current Facility-Administered Medications  Medication Dose Route Frequency Provider Last Rate Last Dose  . acetaminophen (TYLENOL) tablet 650 mg  650 mg Oral Q4H PRN Nanavati, Ankit, MD      . ARIPiprazole (ABILIFY) tablet 15 mg  15 mg Oral Daily Nanavati, Ankit, MD   15 mg at 08/18/17 1039  . escitalopram (LEXAPRO) tablet 20 mg  20 mg Oral Daily Kathrynn Humble, Ankit, MD   20 mg at 08/18/17 1038  . hydrOXYzine (ATARAX/VISTARIL) tablet 25 mg  25 mg Oral Q6H PRN Nanavati, Ankit, MD      . ziprasidone (GEODON) injection 20 mg  20 mg Intramuscular Q12H PRN Varney Biles, MD       And  . LORazepam (ATIVAN) tablet 1 mg  1 mg Oral PRN Nanavati, Ankit, MD      . ondansetron (ZOFRAN) tablet 4 mg  4 mg Oral Q8H PRN Nanavati, Ankit, MD      . traZODone (DESYREL) tablet 100 mg  100 mg Oral QHS Nanavati, Ankit, MD   100 mg at 08/18/17 0022  . zolpidem (AMBIEN) tablet 5 mg  5 mg Oral QHS PRN Varney Biles, MD       Current Outpatient Medications  Medication Sig Dispense Refill  . ARIPiprazole (ABILIFY) 15 MG tablet Take 1 tablet (15 mg total) by mouth daily. For mood control 30 tablet 0  . FLUoxetine (PROZAC) 20 MG capsule Take 1 capsule (20 mg total) by mouth daily. For depression 30 capsule 0  . hydrOXYzine (ATARAX/VISTARIL) 50 MG tablet Take 1 tablet (50 mg total) by mouth every 6 (six) hours as needed for anxiety. 30 tablet 0  . traZODone (DESYREL) 100 MG tablet Take 1 tablet (100 mg) by mouth at  bedtime: For sleep 30 tablet 0    Musculoskeletal: Strength & Muscle Tone: within normal limits Gait & Station: normal Patient leans: N/A  Psychiatric Specialty Exam: Physical Exam  Constitutional: He is oriented to person, place, and time. He appears well-developed and well-nourished. He appears lethargic.  HENT:  Head: Normocephalic.  Neck: Normal range of motion.  Respiratory: Effort  normal.  Musculoskeletal: Normal range of motion.  Neurological: He is oriented to person, place, and time. He has normal strength. He appears lethargic.  Psychiatric: His speech is normal. Judgment normal. His affect is blunt. He is slowed. Cognition and memory are normal. He exhibits a depressed mood. He expresses suicidal ideation.    Review of Systems  Psychiatric/Behavioral: Positive for depression and suicidal ideas.  All other systems reviewed and are negative.   Blood pressure 105/64, pulse 82, temperature 98.3 F (36.8 C), temperature source Oral, resp. rate 18, SpO2 97 %.There is no height or weight on file to calculate BMI.  General Appearance: Casual  Eye Contact:  Fair  Speech:  Slow  Volume:  Decreased  Mood:  Depressed  Affect:  Flat  Thought Process:  Coherent  Orientation:  Full (Time, Place, and Person)  Thought Content:  Logical  Suicidal Thoughts:  Yes.  without intent/plan  Homicidal Thoughts:  No  Memory:  Immediate;   Good Recent;   Good Remote;   Good  Judgement:  Impaired  Insight:  Lacking  Psychomotor Activity:  Normal  Concentration:  Concentration: Fair and Attention Span: Fair  Recall:  [place] of Knowledge:  Good  Language:  Good  Akathisia:  No  Handed:  Right  AIMS (if indicated):     Assets:  Communication Skills Desire for Improvement Financial Resources/Insurance Housing Physical Health Resilience Social Support  ADL's:  Intact  Cognition:  Impaired,  Moderate  Sleep:        Treatment Plan Summary: Daily contact with patient to assess and evaluate symptoms and progress in treatment, Medication management and schizoaffective disorder, bipolar type: -Crisis stabilization -Medication management:  Restarted Abilify 15 mg daily for mood stabilization and Lexapro 20 mg daily for depression, Abilify 400 mg IM once for monthly injection of mood stabilizer. Vistaril 25 mg every six hours PRN anxiety and Trazodone 100 mg at bedtime for  sleep started -Individual counseling Disposition: Supportive therapy provided about ongoing stressors.  Waylan Boga, NP 08/18/2017 1:17 PM

## 2017-08-18 NOTE — ED Notes (Signed)
Bed: [id number] Expected date:  Expected time:  Means of arrival:  Comments: [place]

## 2017-08-18 NOTE — ED Notes (Signed)
Pt is calm and cooperative. He reports needing his Abilify injection now and reports feeling suicidal without a plan.

## 2017-08-19 DIAGNOSIS — F1210 Cannabis abuse, uncomplicated: Secondary | ICD-10-CM | POA: Diagnosis not present

## 2017-08-19 DIAGNOSIS — R45851 Suicidal ideations: Secondary | ICD-10-CM | POA: Diagnosis not present

## 2017-08-19 DIAGNOSIS — F17210 Nicotine dependence, cigarettes, uncomplicated: Secondary | ICD-10-CM | POA: Diagnosis not present

## 2017-08-19 DIAGNOSIS — R109 Unspecified abdominal pain: Secondary | ICD-10-CM | POA: Diagnosis not present

## 2017-08-19 DIAGNOSIS — F419 Anxiety disorder, unspecified: Secondary | ICD-10-CM | POA: Diagnosis not present

## 2017-08-19 DIAGNOSIS — F333 Major depressive disorder, recurrent, severe with psychotic symptoms: Secondary | ICD-10-CM | POA: Diagnosis not present

## 2017-08-19 MED ORDER — HYDROXYZINE HCL 25 MG PO TABS: 25.0000 mg | ORAL_TABLET | Freq: Four times a day (QID) | ORAL | 0 refills | Status: DC | PRN

## 2017-08-19 MED ORDER — ESCITALOPRAM OXALATE 20 MG PO TABS: 20.0000 mg | ORAL_TABLET | Freq: Every day | ORAL | 0 refills | Status: DC

## 2017-08-19 MED ORDER — TRAZODONE HCL 100 MG PO TABS: ORAL_TABLET | ORAL | 0 refills | Status: DC

## 2017-08-19 MED ORDER — ARIPIPRAZOLE 15 MG PO TABS: 15.0000 mg | ORAL_TABLET | Freq: Every day | ORAL | 0 refills | Status: DC

## 2017-08-19 NOTE — Consult Note (Signed)
[facility] Face-to-Face Psychiatry Consult   Reason for Consult:  Suicidal Ideation Referring Physician:  Emergency Room Physician Patient Identification: Marvin Crawford MRN:  [id number] Principal Diagnosis: MDD (major depressive disorder), recurrent, severe, with psychosis ([place]) Diagnosis:   Patient Active Problem List   Diagnosis Date Noted  . MDD (major depressive disorder), recurrent, severe, with psychosis ([place]) [F33.3] 02/16/2013    Priority: High  . Schizoaffective disorder ([place]) [F25.9] 07/06/2017  . Major depressive disorder, recurrent, severe with psychotic features ([place]) [F33.3] 07/05/2017  . 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 Stuttering [F80.81] 08/09/2015  . Cannabis use disorder, moderate, dependence ([facility]) [F12.20] 08/09/2015    Total Time spent with patient: 30 minutes  HPI:   Patient is a 22 y.o. male patient admitted to the ER following verbalization of suicidal ideation. He missed his Abilify injection and recently ran out of his Abilify oral medications.  Oral Abilify started yesterday and injection given today.  He denies suicidal/homicidal ideations, hallucinations, and substance abuse.  Smiles on assessment and requests to return home, stable for discharge  Past Psychiatric History:  Depression, schizoaffective disorder  Risk to Self: [place]One Risk to Others: Homicidal Ideation: No Thoughts of Harm to Others: No Current Homicidal Intent: No Current Homicidal Plan: No Access to Homicidal Means: No Identified Victim: n/a History of harm to others?: No Assessment of Violence: None Noted Violent Behavior Description: per note, pt has hx of violence Does patient have access to weapons?: No Criminal Charges Pending?: No Does patient have a court date: No Prior Inpatient Therapy: Prior Inpatient Therapy: Yes Prior Therapy Dates: 2014 - 2018 Prior Therapy Facilty/Provider([facility]): [facility] Reason for Treatment: SI,  schizophrenia Prior Outpatient Therapy: Prior Outpatient Therapy: Yes Prior Therapy Dates: currently Prior Therapy [facility]ilty[facility]Provider[facility]): [facility] Reason for Treatment: schizophrenia med management Does patient have an ACCT team?: Yes Does patient have Intensive In-House Services?  : No Does patient have [facility] services? : No Does patient have P4CC services?: No  Past Medical History:  Past Medical History:  Diagnosis Date  . ADD (attention deficit disorder)   . Anxiety   . Depression   . Environmental allergies    History reviewed. No pertinent surgical history. Family History:  Family History  Problem Relation Age of Onset  . Hypertension Mother   . Hypertension Father   . Mental illness Neg Hx    Social History:  Social History   Substance and Sexual Activity  Alcohol Use No  . Alcohol/week: 0.0 oz     Social History   Substance and Sexual Activity  Drug Use Yes  . Types: Other-see comments, Marijuana   Comment: oil smoked in pipe calls "DAB", friends buy for him    Social History   Socioeconomic History  . Marital status: Single    Spouse name: None  . Number of children: None  . Years of education: None  . Highest education level: None  Social Needs  . Financial resource strain: None  . Food insecurity - worry: None  . Food insecurity - inability: None  . Transportation needs - medical: None  . Transportation needs - non-medical: None  Occupational History  . None  Tobacco Use  . Smoking status: Current Every Day Smoker    Packs/day: 2.00  . Smokeless tobacco: Never Used  Substance and Sexual Activity  . Alcohol use: No    Alcohol/week: 0.0 oz  . Drug use: Yes    Types: Other-see comments, Marijuana    Comment:  oil smoked in pipe calls "DAB", friends buy for him  . Sexual activity: Yes    Birth control/protection: Condom  Other Topics Concern  . None  Social History Narrative  . None   Allergies:  No Known Allergies  Labs:  Results  for orders placed or performed during the hospital encounter of 08/17/17 (from the past 48 hour(s))  Comprehensive metabolic panel     Status: Abnormal   Collection Time: 08/17/17  6:44 PM  Result Value Ref Range   Sodium 141 135 - 145 mmol/L   Potassium 3.5 3.5 - 5.1 mmol/L   Chloride 106 101 - 111 mmol/L   CO2 27 22 - 32 mmol/L   Glucose, Bld 102 (H) 65 - 99 mg/dL   BUN 10 6 - 20 mg/dL   Creatinine, Ser 1.07 0.61 - 1.24 mg/dL   Calcium 9.9 8.9 - 10.3 mg/dL   Total Protein 7.7 6.5 - 8.1 g/dL   Albumin 4.4 3.5 - 5.0 g/dL   AST 22 15 - 41 U/L   ALT 28 17 - 63 U/L   Alkaline Phosphatase 60 38 - 126 U/L   Total Bilirubin 1.0 0.3 - 1.2 mg/dL   GFR calc non Af Amer >60 >60 mL/min   GFR calc Af Amer >60 >60 mL/min    Comment: (NOTE) The eGFR has been calculated using the CKD EPI equation. This calculation has not been validated in all clinical situations. eGFR's persistently <60 mL/min signify possible Chronic Kidney Disease.    Anion gap 8 5 - 15  Ethanol     Status: None   Collection Time: 08/17/17  6:44 PM  Result Value Ref Range   Alcohol, Ethyl (B) <10 <10 mg/dL    Comment:        LOWEST DETECTABLE LIMIT FOR SERUM ALCOHOL IS 10 mg/dL FOR MEDICAL PURPOSES ONLY   Salicylate level     Status: None   Collection Time: 08/17/17  6:44 PM  Result Value Ref Range   Salicylate Lvl <5.5 2.8 - 30.0 mg/dL  Acetaminophen level     Status: Abnormal   Collection Time: 08/17/17  6:44 PM  Result Value Ref Range   Acetaminophen (Tylenol), Serum <10 (L) 10 - 30 ug/mL    Comment:        THERAPEUTIC CONCENTRATIONS VARY SIGNIFICANTLY. A RANGE OF 10-30 ug/mL MAY BE AN EFFECTIVE CONCENTRATION FOR MANY PATIENTS. HOWEVER, SOME ARE BEST TREATED AT CONCENTRATIONS OUTSIDE THIS RANGE. ACETAMINOPHEN CONCENTRATIONS >150 ug/mL AT 4 HOURS AFTER INGESTION AND >50 ug/mL AT 12 HOURS AFTER INGESTION ARE OFTEN ASSOCIATED WITH TOXIC REACTIONS.   cbc     Status: None   Collection Time: 08/17/17   6:44 PM  Result Value Ref Range   WBC 5.2 4.0 - 10.5 K/uL   RBC 4.78 4.22 - 5.81 MIL/uL   Hemoglobin 13.9 13.0 - 17.0 g/dL   HCT 40.0 39.0 - 52.0 %   MCV 83.7 78.0 - 100.0 fL   MCH 29.1 26.0 - 34.0 pg   MCHC 34.8 30.0 - 36.0 g/dL   RDW 12.6 11.5 - 15.5 %   Platelets 277 150 - 400 K/uL  Rapid urine drug screen (hospital performed)     Status: None   Collection Time: 08/17/17  8:26 PM  Result Value Ref Range   Opiates NONE DETECTED NONE DETECTED   Cocaine NONE DETECTED NONE DETECTED   Benzodiazepines NONE DETECTED NONE DETECTED   Amphetamines NONE DETECTED NONE DETECTED   Tetrahydrocannabinol NONE DETECTED NONE  DETECTED   Barbiturates NONE DETECTED NONE DETECTED    Comment: (NOTE) DRUG SCREEN FOR MEDICAL PURPOSES ONLY.  IF CONFIRMATION IS NEEDED FOR ANY PURPOSE, NOTIFY LAB WITHIN 5 DAYS. LOWEST DETECTABLE LIMITS FOR URINE DRUG SCREEN Drug Class                     Cutoff (ng/mL) Amphetamine and metabolites    1000 Barbiturate and metabolites    200 Benzodiazepine                 035 Tricyclics and metabolites     300 Opiates and metabolites        300 Cocaine and metabolites        300 THC                            50     Current Facility-Administered Medications  Medication Dose Route Frequency Provider Last Rate Last Dose  . acetaminophen (TYLENOL) tablet 650 mg  650 mg Oral Q4H PRN Nanavati, Ankit, MD      . ARIPiprazole (ABILIFY) tablet 15 mg  15 mg Oral Daily Kathrynn Humble, Ankit, MD   15 mg at 08/19/17 0929  . escitalopram (LEXAPRO) tablet 20 mg  20 mg Oral Daily Nanavati, Ankit, MD   20 mg at 08/19/17 4656  . hydrOXYzine (ATARAX/VISTARIL) tablet 25 mg  25 mg Oral Q6H PRN Nanavati, Ankit, MD      . ondansetron (ZOFRAN) tablet 4 mg  4 mg Oral Q8H PRN Nanavati, Ankit, MD      . traZODone (DESYREL) tablet 100 mg  100 mg Oral QHS Nanavati, Ankit, MD   100 mg at 08/18/17 2116  . ziprasidone (GEODON) injection 20 mg  20 mg Intramuscular Q12H PRN Varney Biles, MD        Current Outpatient Medications  Medication Sig Dispense Refill  . ARIPiprazole (ABILIFY) 15 MG tablet Take 1 tablet (15 mg total) by mouth daily. For mood control 30 tablet 0  . FLUoxetine (PROZAC) 20 MG capsule Take 1 capsule (20 mg total) by mouth daily. For depression 30 capsule 0  . hydrOXYzine (ATARAX/VISTARIL) 50 MG tablet Take 1 tablet (50 mg total) by mouth every 6 (six) hours as needed for anxiety. 30 tablet 0  . traZODone (DESYREL) 100 MG tablet Take 1 tablet (100 mg) by mouth at bedtime: For sleep 30 tablet 0    Musculoskeletal: Strength & Muscle Tone: within normal limits Gait & Station: normal Patient leans: N/A  Psychiatric Specialty Exam: Physical Exam  Constitutional: He is oriented to person, place, and time. He appears well-developed and well-nourished. He appears lethargic.  HENT:  Head: Normocephalic.  Neck: Normal range of motion.  Respiratory: Effort normal.  Musculoskeletal: Normal range of motion.  Neurological: He is oriented to person, place, and time. He has normal strength. He appears lethargic.  Psychiatric: He has a normal mood and affect. His speech is normal and behavior is normal. Judgment and thought content normal. Cognition and memory are normal.    Review of Systems  All other systems reviewed and are negative.   Blood pressure (!) 104/55, pulse 81, temperature 98.2 F (36.8 C), temperature source Oral, resp. rate 18, SpO2 99 %.There is no height or weight on file to calculate BMI.  General Appearance: Casual  Eye Contact:  Good  Speech:  Normal  Volume:  WDL  Mood:  Euthymic  Affect:  Congruent   Thought Process:  Coherent  Orientation:  Full (Time, Place, and Person)  Thought Content:  Logical  Suicidal Thoughts:  No  Homicidal Thoughts:  No  Memory:  Immediate;   Good Recent;   Good Remote;   Good  Judgement:  Fair  Insight:  Fair  Psychomotor Activity:  Normal  Concentration:  Concentration: Fair and Attention Span: Fair   Recall:  Good  Fund of Knowledge:  Good  Language:  Good  Akathisia:  No  Handed:  Right  AIMS (if indicated):     Assets:  Communication Skills Desire for Improvement Financial Resources/Insurance Housing Physical Health Resilience Social Support  ADL's:  Intact  Cognition:  WDL  Sleep:        Treatment Plan Summary: Daily contact with patient to assess and evaluate symptoms and progress in treatment, Medication management and schizoaffective disorder, bipolar type: -Crisis stabilization -Medication management:  Continued Abilify 15 mg daily for mood stabilization and Lexapro 20 mg daily for depression, Abilify 400 mg IM once for monthly injection of mood stabilizer. Vistaril 25 mg every six hours PRN anxiety and Trazodone 100 mg at bedtime for sleep continued -Individual counseling  Disposition: Discharge home  Waylan Boga, NP 08/19/2017 12:29 PM  Patient seen face-to-face for psychiatric evaluation, chart reviewed and case discussed with the physician extender and developed treatment plan. Reviewed the information documented and agree with the treatment plan. Corena Pilgrim, MD

## 2017-08-19 NOTE — [facility] Suicide Risk Assessment (Signed)
Suicide Risk Assessment  Discharge Assessment   [facility]   Principal Problem: MDD (major depressive disorder), recurrent, severe, with psychosis (HCC) Discharge Diagnoses:  Patient Active Problem List   Diagnosis Date Noted  . MDD (major depressive disorder), recurrent, severe, with psychosis (HCC) [F33.3] 02/16/2013    Priority: High  . Schizoaffective disorder (HCC) [F25.9] 07/06/2017  . 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 Stuttering [F80.81] 08/09/2015  . Cannabis use disorder, moderate, dependence (HCC) [F12.20] 08/09/2015    Total Time spent with patient: 30 minutes   Musculoskeletal: Strength & Muscle Tone: within normal limits Gait & Station: normal Patient leans: N/A  Psychiatric Specialty Exam: Physical Exam  Constitutional: He is oriented to person, place, and time. He appears well-developed and well-nourished. He appears lethargic.  HENT:  Head: Normocephalic.  Neck: Normal range of motion.  Respiratory: Effort normal.  Musculoskeletal: Normal range of motion.  Neurological: He is oriented to person, place, and time. He has normal strength. He appears lethargic.  Psychiatric: He has a normal mood and affect. His speech is normal and behavior is normal. Judgment and thought content normal. Cognition and memory are normal.    Review of Systems  All other systems reviewed and are negative.   Blood pressure (!) 104/55, pulse 81, temperature 98.2 F (36.8 C), temperature source Oral, resp. rate 18, SpO2 99 %.There is no height or weight on file to calculate BMI.  General Appearance: Casual  Eye Contact:  Good  Speech:  Normal  Volume:  WDL  Mood:  Euthymic  Affect:  Congruent   Thought Process:  Coherent  Orientation:  Full (Time, Place, and Person)  Thought Content:  Logical  Suicidal Thoughts:  No  Homicidal  Thoughts:  No  Memory:  Immediate;   Good Recent;   Good Remote;   Good  Judgement:  Fair  Insight:  Fair  Psychomotor Activity:  Normal  Concentration:  Concentration: Fair and Attention Span: Fair  Recall:  Good  Fund of Knowledge:  Good  Language:  Good  Akathisia:  No  Handed:  Right  AIMS (if indicated):     Assets:  Communication Skills Desire for Improvement Financial Resources/Insurance Housing Physical Health Resilience Social Support  ADL's:  Intact  Cognition:  WDL  Sleep:      Mental Status Per Nursing Assessment::   On Admission:   depression with suicidal ideations  Demographic Factors:  Male and Adolescent or young adult  Loss Factors: NA  Historical Factors: NA  Risk Reduction Factors:   Sense of responsibility to family, Living with another person, especially a relative and Positive social support  Continued Clinical Symptoms:  None   Cognitive Features That Contribute To Risk:  None    Suicide Risk:  Minimal: No identifiable suicidal ideation.  Patients presenting with no risk factors but with morbid ruminations; may be classified as minimal risk based on the severity of the depressive symptoms    Plan Of Care/Follow-up recommendations:  Activity:  as tolerated Diet:  heart healthy diet  Jeromie Gainor, NP 08/19/2017, 4:55 PM

## 2017-08-19 NOTE — ED Notes (Signed)
Patient alert and oriented. Patient states he has had some depression and anxiety. Patient states he did have some problems with his sleep. Patient denies SI/HI and AVH this morning. Patient provided support and encouragement. Patient is taking medication as ordered. Patient. Is in a pleasant mood. Q 15 minute checks in progress and patient remains safe on unit. Monitoring continues.

## 2017-08-19 NOTE — ED Notes (Signed)
Patient discharge home with mom. AVS/Prescriptions/Follow-up care reviewed with patient and mom and they verbalized understanding and have no questions or concerns. Patient alert and oriented and verbal. Patient vitals 98.5-119/71-99-20- 99% room air. All patient belongings returned to him at discharge. Patient escorted to lobby by staff.

## 2017-08-24 DIAGNOSIS — R10823 Right lower quadrant rebound abdominal tenderness: Secondary | ICD-10-CM | POA: Diagnosis not present

## 2017-08-24 DIAGNOSIS — R10814 Left lower quadrant abdominal tenderness: Secondary | ICD-10-CM | POA: Diagnosis not present

## 2017-08-24 DIAGNOSIS — R10812 Left upper quadrant abdominal tenderness: Secondary | ICD-10-CM | POA: Diagnosis not present

## 2017-11-05 DIAGNOSIS — N341 Nonspecific urethritis: Secondary | ICD-10-CM | POA: Diagnosis not present

## 2018-04-19 DIAGNOSIS — Z3009 Encounter for other general counseling and advice on contraception: Secondary | ICD-10-CM | POA: Diagnosis not present

## 2018-04-19 DIAGNOSIS — Z0389 Encounter for observation for other suspected diseases and conditions ruled out: Secondary | ICD-10-CM | POA: Diagnosis not present

## 2018-04-19 DIAGNOSIS — Z1388 Encounter for screening for disorder due to exposure to contaminants: Secondary | ICD-10-CM | POA: Diagnosis not present

## 2018-05-17 ENCOUNTER — Emergency Department (HOSPITAL_COMMUNITY): Payer: Medicaid Other

## 2018-05-17 ENCOUNTER — Encounter (HOSPITAL_COMMUNITY): Payer: Self-pay | Admitting: *Deleted

## 2018-05-17 ENCOUNTER — Other Ambulatory Visit: Payer: Self-pay

## 2018-05-17 ENCOUNTER — Emergency Department (HOSPITAL_COMMUNITY)
Admission: EM | Admit: 2018-05-17 | Discharge: 2018-05-17 | Disposition: A | Discharge status: Home / Self Care | Payer: Medicaid Other | Attending: Emergency Medicine | Admitting: Emergency Medicine

## 2018-05-17 DIAGNOSIS — Z79899 Other long term (current) drug therapy: Secondary | ICD-10-CM | POA: Insufficient documentation

## 2018-05-17 DIAGNOSIS — R05 Cough: Secondary | ICD-10-CM | POA: Diagnosis not present

## 2018-05-17 DIAGNOSIS — F17200 Nicotine dependence, unspecified, uncomplicated: Secondary | ICD-10-CM | POA: Insufficient documentation

## 2018-05-17 DIAGNOSIS — R059 Cough, unspecified: Secondary | ICD-10-CM

## 2018-05-17 MED ORDER — GUAIFENESIN-CODEINE 100-10 MG/5ML PO SOLN: 10.0000 mL | Freq: Four times a day (QID) | ORAL | 0 refills | Status: AC | PRN

## 2018-05-17 MED ORDER — FLUTICASONE PROPIONATE 50 MCG/ACT NA SUSP: 1.0000 | Freq: Every day | NASAL | 0 refills | Status: DC

## 2018-05-17 NOTE — Discharge Instructions (Addendum)
You were evaluated today for cough.  Your chest x-ray did not show any signs of infection.  If your cough is most likely related to your seasonal allergies.  Continue taking your over-the-counter antihistamine for your allergies.  I have also prescribed you cough syrup to help with your cough.  Please take as prescribed.  Please follow-up with your primary care provider for reevaluation of this does not resolve.  Return to emergency department with any new or worsening symptoms such as:  Contact a doctor if: You have new problems (symptoms). You cough up yellow fluid (pus). Your cough does not get better after 2-3 weeks, or your cough gets worse. Medicine does not help your cough and you are not sleeping well. You have pain that gets worse or pain that is not helped with medicine. You have a fever. You are losing weight and you do not know why. You have night sweats. Get help right away if: You cough up blood. You have trouble breathing. Your heartbeat is very fast.

## 2018-05-17 NOTE — ED Provider Notes (Signed)
[facility]-EMERGENCY DEPT Provider Note   CSN: [id number] Arrival date & time: 05/17/18  2043   History   Chief Complaint Chief Complaint  Patient presents with  . Cough    x 2 weeks    HPI Marvin Crawford is a 22 y.o. male with a past medical history significant for environmental allergies and anxiety who presents for evaluation of cough.  Patient states his cough has been productive in nature with intermittent white and yellow mucus.  Patient states he recently started a new job where he works outside in [employer].  Patient states he has had increased nasal drainage and cough since beginning this position.  Denies fever, chills, chest pain, shortness of breath, abdominal pain, nausea, vomiting, nasal congestion, sinus pressure, headache, hemoptysis.  Admits to clear nasal drainage, worse over the last month.  Patient states he does have a history of environmental allergies for which he takes Zyrtec.  States his allergies do sometimes get worse around this time of year, however his cough does not normally last 2 weeks.  Denies sick contacts, travel, known exposure to TB.  HPI  Past Medical History:  Diagnosis Date  . ADD (attention deficit disorder)   . Anxiety   . Depression   . Environmental allergies     Patient Active Problem List   Diagnosis Date Noted  . Schizoaffective disorder (HCC) 07/06/2017  . Major depressive disorder, recurrent, severe with psychotic features (HCC) 07/05/2017  . 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 Stuttering 08/09/2015  . Cannabis use disorder, moderate, dependence (HCC) 08/09/2015  . MDD (major depressive disorder), recurrent, severe, with psychosis (HCC) 02/16/2013    History reviewed. No pertinent surgical history.      Home Medications    Prior to Admission medications   Medication Sig Start Date End Date Taking? Authorizing Provider  ARIPiprazole (ABILIFY) 15 MG tablet Take 1 tablet (15 mg total)  by mouth daily. For mood control 08/19/17   Charm Rings, NP  escitalopram (LEXAPRO) 20 MG tablet Take 1 tablet (20 mg total) by mouth daily. 08/20/17   Charm Rings, NP  fluticasone (FLONASE) 50 MCG/ACT nasal spray Place 1 spray into both nostrils daily. 05/17/18   Fiorela Pelzer A, PA-C  guaiFENesin-codeine 100-10 MG/5ML syrup Take 10 mLs by mouth every 6 (six) hours as needed for up to 3 days for cough. 05/17/18 05/20/18  Jsiah Menta A, PA-C  hydrOXYzine (ATARAX/VISTARIL) 25 MG tablet Take 1 tablet (25 mg total) by mouth every 6 (six) hours as needed for anxiety. 08/19/17   Charm Rings, NP  traZODone (DESYREL) 100 MG tablet Take 1 tablet (100 mg) by mouth at bedtime: For sleep 08/19/17   Charm Rings, NP    Family History Family History  Problem Relation Age of Onset  . Hypertension Mother   . Hypertension Father   . Mental illness Neg Hx     Social History Social History   Tobacco Use  . Smoking status: Current Every Day Smoker    Packs/day: 2.00  . Smokeless tobacco: Never Used  Substance Use Topics  . Alcohol use: No    Alcohol/week: 0.0 standard drinks  . Drug use: Yes    Types: Other-see comments, Marijuana    Comment: oil smoked in pipe calls "DAB", friends buy for him     Allergies   Patient has no known allergies.   Review of Systems Review of Systems  Constitutional: Negative for activity change, appetite change, chills, diaphoresis,  fatigue and fever.  HENT: Positive for sneezing. Negative for congestion, drooling, ear discharge, ear pain, facial swelling, nosebleeds, postnasal drip, rhinorrhea, sinus pressure, sinus pain, sore throat, tinnitus, trouble swallowing and voice change.   Respiratory: Positive for cough. Negative for choking, chest tightness, shortness of breath, wheezing and stridor.   Cardiovascular: Negative for chest pain.  Gastrointestinal: Negative for diarrhea, nausea and vomiting.  Skin: Negative.      Physical  Exam Updated Vital Signs BP 125/75 (BP Location: Left Arm)   Pulse (!) 105   Temp 99.3 F (37.4 C) (Oral)   Resp 16   Ht 6\' 1"  (1.854 m)   Wt 127.9 kg   SpO2 98%   BMI 37.21 kg/m   Physical Exam  Constitutional: He appears well-developed and well-nourished. No distress.  HENT:  Head: Normocephalic and atraumatic.  Right Ear: Tympanic membrane, external ear and ear canal normal. No drainage or tenderness. Tympanic membrane is not erythematous, not retracted and not bulging.  Left Ear: Tympanic membrane, external ear and ear canal normal. No drainage or tenderness. Tympanic membrane is not erythematous, not retracted and not bulging.  Nose: Mucosal edema and rhinorrhea present. No sinus tenderness, nasal deformity, septal deviation or nasal septal hematoma. No epistaxis.  No foreign bodies. Right sinus exhibits no maxillary sinus tenderness and no frontal sinus tenderness. Left sinus exhibits no maxillary sinus tenderness and no frontal sinus tenderness.  Mouth/Throat: Uvula is midline and mucous membranes are normal. No trismus in the jaw. No uvula swelling. Posterior oropharyngeal erythema present. No oropharyngeal exudate, posterior oropharyngeal edema or tonsillar abscesses. No tonsillar exudate.  Eyes: Pupils are equal, round, and reactive to light.  Neck: Normal range of motion. Neck supple.  Cardiovascular: Normal rate, regular rhythm, normal heart sounds and intact distal pulses. Exam reveals no gallop and no friction rub.  No murmur heard. Pulmonary/Chest: Effort normal and breath sounds normal. No respiratory distress.  Abdominal: Soft. He exhibits no distension.  Musculoskeletal: Normal range of motion.  Neurological: He is alert.  Skin: Skin is warm and dry. He is not diaphoretic.  Psychiatric: He has a normal mood and affect.  Nursing note and vitals reviewed.    ED Treatments / Results  Labs (all labs ordered are listed, but only abnormal results are displayed) Labs  Reviewed - No data to display  EKG None  Radiology Dg Chest 2 View  Result Date: 05/17/2018 CLINICAL DATA:  Patient with productive cough. EXAM: CHEST - 2 VIEW COMPARISON:  None. FINDINGS: Normal cardiac and mediastinal contours. No consolidative pulmonary opacities. No pleural effusion or pneumothorax. IMPRESSION: No acute cardiopulmonary process. Electronically Signed   By: Annia Belt M.D.   On: 05/17/2018 21:44    Procedures Procedures (including critical care time)  Medications Ordered in ED Medications - No data to display   Initial Impression / Assessment and Plan / ED Course  I have reviewed the triage vital signs and the nursing notes.  Pertinent labs & imaging results that were available during my care of the patient were reviewed by me and considered in my medical decision making (see chart for details).  22 year old otherwise well-appearing male presents for evaluation of 2 weeks of intermittent productive cough.  History of seasonal allergies currently on Zyrtec.  Afebrile, nonseptic, non-ill-appearing. Lungs clear to auscultation bilaterally. Will obtain chest x-ray.    Chest x-ray negative for infiltrates, pulmonary edema or CHF.  His cough is most likely related to postnasal drip secondary to seasonal allergies and increased  outdoor exposure to allergens or possibly a viral upper respiratory infection.  Discussed with patient continue with his OTC antihistamine with addition of fluticasone nasal spray.  Patient requesting cough syrup as his cough is worse at night he has not been able to sleep.  Patient is stable for discharge at this time.  Discussed with patient reasons to return to the emergency department.  Patient voiced understanding is agreeable for follow-up.    Final Clinical Impressions(s) / ED Diagnoses   Final diagnoses:  Cough    ED Discharge Orders         Ordered    fluticasone (FLONASE) 50 MCG/ACT nasal spray  Daily     05/17/18 2149     guaiFENesin-codeine 100-10 MG/5ML syrup  Every 6 hours PRN     05/17/18 2149           Derotha Fishbaugh A, PA-C 05/17/18 2155    Lorre Nick, MD 05/17/18 2339

## 2018-05-17 NOTE — ED Triage Notes (Signed)
Pt c/o cough x 2 wks, is productive, color is white.

## 2018-05-28 DIAGNOSIS — Z3009 Encounter for other general counseling and advice on contraception: Secondary | ICD-10-CM | POA: Diagnosis not present

## 2018-05-28 DIAGNOSIS — Z1388 Encounter for screening for disorder due to exposure to contaminants: Secondary | ICD-10-CM | POA: Diagnosis not present

## 2018-05-28 DIAGNOSIS — E669 Obesity, unspecified: Secondary | ICD-10-CM | POA: Diagnosis not present

## 2018-05-28 DIAGNOSIS — Z0389 Encounter for observation for other suspected diseases and conditions ruled out: Secondary | ICD-10-CM | POA: Diagnosis not present

## 2018-05-28 DIAGNOSIS — N341 Nonspecific urethritis: Secondary | ICD-10-CM | POA: Diagnosis not present

## 2018-05-31 DIAGNOSIS — Z5181 Encounter for therapeutic drug level monitoring: Secondary | ICD-10-CM | POA: Diagnosis not present

## 2018-05-31 DIAGNOSIS — Z01118 Encounter for examination of ears and hearing with other abnormal findings: Secondary | ICD-10-CM | POA: Diagnosis not present

## 2018-05-31 DIAGNOSIS — R05 Cough: Secondary | ICD-10-CM | POA: Diagnosis not present

## 2018-05-31 DIAGNOSIS — Z131 Encounter for screening for diabetes mellitus: Secondary | ICD-10-CM | POA: Diagnosis not present

## 2018-06-14 DIAGNOSIS — R7303 Prediabetes: Secondary | ICD-10-CM | POA: Diagnosis not present

## 2018-06-14 DIAGNOSIS — E785 Hyperlipidemia, unspecified: Secondary | ICD-10-CM | POA: Diagnosis not present

## 2018-07-16 DIAGNOSIS — Z1388 Encounter for screening for disorder due to exposure to contaminants: Secondary | ICD-10-CM | POA: Diagnosis not present

## 2018-07-16 DIAGNOSIS — Z0389 Encounter for observation for other suspected diseases and conditions ruled out: Secondary | ICD-10-CM | POA: Diagnosis not present

## 2018-07-16 DIAGNOSIS — N341 Nonspecific urethritis: Secondary | ICD-10-CM | POA: Diagnosis not present

## 2018-07-16 DIAGNOSIS — Z3009 Encounter for other general counseling and advice on contraception: Secondary | ICD-10-CM | POA: Diagnosis not present

## 2018-08-28 DIAGNOSIS — Z0389 Encounter for observation for other suspected diseases and conditions ruled out: Secondary | ICD-10-CM | POA: Diagnosis not present

## 2018-08-28 DIAGNOSIS — Z1388 Encounter for screening for disorder due to exposure to contaminants: Secondary | ICD-10-CM | POA: Diagnosis not present

## 2018-08-28 DIAGNOSIS — Z3009 Encounter for other general counseling and advice on contraception: Secondary | ICD-10-CM | POA: Diagnosis not present

## 2018-09-22 ENCOUNTER — Emergency Department (HOSPITAL_COMMUNITY): Payer: Medicaid Other

## 2018-09-22 ENCOUNTER — Other Ambulatory Visit: Payer: Self-pay

## 2018-09-22 ENCOUNTER — Emergency Department (HOSPITAL_COMMUNITY)
Admission: EM | Admit: 2018-09-22 | Discharge: 2018-09-22 | Disposition: A | Discharge status: Home / Self Care | Payer: Medicaid Other | Attending: Emergency Medicine | Admitting: Emergency Medicine

## 2018-09-22 ENCOUNTER — Encounter (HOSPITAL_COMMUNITY): Payer: Self-pay | Admitting: Emergency Medicine

## 2018-09-22 DIAGNOSIS — E860 Dehydration: Secondary | ICD-10-CM

## 2018-09-22 DIAGNOSIS — F17200 Nicotine dependence, unspecified, uncomplicated: Secondary | ICD-10-CM | POA: Insufficient documentation

## 2018-09-22 DIAGNOSIS — Z79899 Other long term (current) drug therapy: Secondary | ICD-10-CM | POA: Insufficient documentation

## 2018-09-22 DIAGNOSIS — J020 Streptococcal pharyngitis: Secondary | ICD-10-CM | POA: Diagnosis not present

## 2018-09-22 DIAGNOSIS — R197 Diarrhea, unspecified: Secondary | ICD-10-CM | POA: Insufficient documentation

## 2018-09-22 DIAGNOSIS — R05 Cough: Secondary | ICD-10-CM | POA: Diagnosis not present

## 2018-09-22 LAB — URINALYSIS, ROUTINE W REFLEX MICROSCOPIC
BILIRUBIN URINE: NEGATIVE
Glucose, UA: NEGATIVE mg/dL
HGB URINE DIPSTICK: NEGATIVE
Ketones, ur: 5 mg/dL — AB
Leukocytes,Ua: NEGATIVE
Nitrite: NEGATIVE
PH: 6 (ref 5.0–8.0)
Protein, ur: NEGATIVE mg/dL
SPECIFIC GRAVITY, URINE: 1.025 (ref 1.005–1.030)

## 2018-09-22 LAB — COMPREHENSIVE METABOLIC PANEL
ALBUMIN: 4.3 g/dL (ref 3.5–5.0)
ALT: 27 U/L (ref 0–44)
AST: 20 U/L (ref 15–41)
Alkaline Phosphatase: 77 U/L (ref 38–126)
Anion gap: 7 (ref 5–15)
BILIRUBIN TOTAL: 0.9 mg/dL (ref 0.3–1.2)
BUN: 8 mg/dL (ref 6–20)
CO2: 28 mmol/L (ref 22–32)
Calcium: 9.6 mg/dL (ref 8.9–10.3)
Chloride: 103 mmol/L (ref 98–111)
Creatinine, Ser: 1.19 mg/dL (ref 0.61–1.24)
GFR calc Af Amer: >60 mL/min (ref >60)
GFR calc non Af Amer: >60 mL/min (ref >60)
Glucose, Bld: 106 mg/dL — ABNORMAL HIGH (ref 70–99)
POTASSIUM: 3.5 mmol/L (ref 3.5–5.1)
Sodium: 138 mmol/L (ref 135–145)
Total Protein: 7.8 g/dL (ref 6.5–8.1)

## 2018-09-22 LAB — INFLUENZA PANEL BY PCR (TYPE A & B)
INFLAPCR: NEGATIVE
Influenza B By PCR: NEGATIVE

## 2018-09-22 LAB — CBC
HEMATOCRIT: 45.4 % (ref 39.0–52.0)
HEMOGLOBIN: 14.6 g/dL (ref 13.0–17.0)
MCH: 27.9 pg (ref 26.0–34.0)
MCHC: 32.2 g/dL (ref 30.0–36.0)
MCV: 86.8 fL (ref 80.0–100.0)
Platelets: 252 10*3/uL (ref 150–400)
RBC: 5.23 MIL/uL (ref 4.22–5.81)
RDW: 12.1 % (ref 11.5–15.5)
WBC: 8.1 10*3/uL (ref 4.0–10.5)
nRBC: 0 % (ref 0.0–0.2)

## 2018-09-22 LAB — GROUP A STREP BY PCR: Group A Strep by PCR: NOT DETECTED

## 2018-09-22 LAB — LIPASE, BLOOD: LIPASE: 29 U/L (ref 11–51)

## 2018-09-22 MED ORDER — KETOROLAC TROMETHAMINE 30 MG/ML IJ SOLN
30.0000 mg | Freq: Once | INTRAMUSCULAR | Status: AC
  Administered 2018-09-22: 30 mg via INTRAVENOUS
  Filled 2018-09-22: qty 1

## 2018-09-22 MED ORDER — PENICILLIN G BENZATHINE & PROC 1200000 UNIT/2ML IM SUSP
1.2000 10*6.[IU] | Freq: Once | INTRAMUSCULAR | Status: AC
  Administered 2018-09-22: 1.2 10*6.[IU] via INTRAMUSCULAR
  Filled 2018-09-22: qty 2

## 2018-09-22 MED ORDER — SODIUM CHLORIDE 0.9 % IV BOLUS
1000.0000 mL | Freq: Once | INTRAVENOUS | Status: AC
  Administered 2018-09-22: 1000 mL via INTRAVENOUS

## 2018-09-22 MED ORDER — ACETAMINOPHEN 325 MG PO TABS
650.0000 mg | ORAL_TABLET | Freq: Once | ORAL | Status: AC | PRN
  Administered 2018-09-22: 650 mg via ORAL
  Filled 2018-09-22: qty 2

## 2018-09-22 MED ORDER — SODIUM CHLORIDE 0.9% FLUSH
3.0000 mL | Freq: Once | INTRAVENOUS | Status: AC
  Administered 2018-09-22: 3 mL via INTRAVENOUS

## 2018-09-22 MED ORDER — DEXAMETHASONE SODIUM PHOSPHATE 10 MG/ML IJ SOLN
10.0000 mg | Freq: Once | INTRAMUSCULAR | Status: AC
Start: 2018-09-22 — End: 2018-09-22
  Administered 2018-09-22: 10 mg via INTRAVENOUS
  Filled 2018-09-22: qty 1

## 2018-09-22 NOTE — ED Triage Notes (Signed)
Patient complaining of headache, diarrhea, productive cough, fever, and sore throat. Patient has been going for a week.

## 2018-09-22 NOTE — Discharge Instructions (Signed)
You have strep throat which is likely what is causing the majority of your symptoms today.  This can cause you to become dehydrated.  You were given antibiotics and steroids to help with your symptoms today, please continue taking Motrin and Tylenol as needed for pain.  Make sure you are drinking plenty of fluids.  If symptoms are not improving please follow-up with your regular doctor.  If you have worsening sore throat, difficulty breathing or swallowing, persistent high fevers, abdominal pain or any other new or concerning symptoms.

## 2018-09-22 NOTE — ED Provider Notes (Signed)
[facility]-EMERGENCY DEPT Provider Note   CSN: [id number] Arrival date & time: 09/22/18  1912     History   Chief Complaint Chief Complaint  Patient presents with  . Cough  . Sore Throat  . Diarrhea    HPI Marvin Crawford is a 23 y.o. male.  Marvin Crawford is a 23 y.o. male with a history of ADD, borderline intellectual disability, anxiety and depression, who presents to the emergency department for evaluation of 1 week of fever, sore throat, body aches, headaches and intermittent loose stools.  Patient reports since onset symptoms have been constant and not improving.  He reports his throat is very sore and it is painful to swallow but he is continued to be able to eat and drink.  He denies any neck swelling pain or stiffness.  He has had subjective fevers and chills at home and was febrile on arrival in triage.  He reports an occasional cough and some rhinorrhea, but these are mild compared to sore throat.  He has had some nausea but no vomiting or abdominal pain has had a few loose stools, nonbloody.  He also reports a mild headache.  He has not taken anything to treat his symptoms prior to arrival, no aggravating or alleviating factors.  Unsure of any sick contacts.  Reports he did receive a flu vaccine this year.     Past Medical History:  Diagnosis Date  . ADD (attention deficit disorder)   . Anxiety   . Depression   . Environmental allergies     Patient Active Problem List   Diagnosis Date Noted  . Schizoaffective disorder (HCC) 07/06/2017  . Major depressive disorder, recurrent, severe with psychotic features (HCC) 07/05/2017  . 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 Stuttering 08/09/2015  . Cannabis use disorder, moderate, dependence (HCC) 08/09/2015  . MDD (major depressive disorder), recurrent, severe, with psychosis (HCC) 02/16/2013    History reviewed. No pertinent surgical history.      Home Medications    Prior to  Admission medications   Medication Sig Start Date End Date Taking? Authorizing Provider  ARIPiprazole (ABILIFY) 15 MG tablet Take 1 tablet (15 mg total) by mouth daily. For mood control 08/19/17   Charm RingsLord, Jamison Y, NP  escitalopram (LEXAPRO) 20 MG tablet Take 1 tablet (20 mg total) by mouth daily. 08/20/17   Charm RingsLord, Jamison Y, NP  fluticasone (FLONASE) 50 MCG/ACT nasal spray Place 1 spray into both nostrils daily. 05/17/18   Henderly, Britni A, PA-C  hydrOXYzine (ATARAX/VISTARIL) 25 MG tablet Take 1 tablet (25 mg total) by mouth every 6 (six) hours as needed for anxiety. 08/19/17   Charm RingsLord, Jamison Y, NP  traZODone (DESYREL) 100 MG tablet Take 1 tablet (100 mg) by mouth at bedtime: For sleep 08/19/17   Charm RingsLord, Jamison Y, NP    Family History Family History  Problem Relation Age of Onset  . Hypertension Mother   . Hypertension Father   . Mental illness Neg Hx     Social History Social History   Tobacco Use  . Smoking status: Current Every Day Smoker    Packs/day: 2.00  . Smokeless tobacco: Never Used  Substance Use Topics  . Alcohol use: No    Alcohol/week: 0.0 standard drinks  . Drug use: Yes    Types: Other-see comments, Marijuana    Comment: oil smoked in pipe calls "DAB", friends buy for him     Allergies   Patient has no known allergies.  Review of Systems Review of Systems  Constitutional: Positive for chills and fever.  HENT: Positive for congestion, rhinorrhea and sore throat. Negative for ear pain, trouble swallowing and voice change.   Eyes: Negative for visual disturbance.  Respiratory: Positive for cough. Negative for shortness of breath and wheezing.   Cardiovascular: Negative for chest pain.  Gastrointestinal: Positive for diarrhea and nausea. Negative for abdominal pain, blood in stool, constipation and vomiting.  Genitourinary: Negative for dysuria and frequency.  Musculoskeletal: Positive for myalgias. Negative for arthralgias, neck pain and neck stiffness.  Skin:  Negative for rash.  Neurological: Negative for dizziness, facial asymmetry, weakness, light-headedness, numbness and headaches.     Physical Exam Updated Vital Signs BP 122/65 (BP Location: Right Arm)   Pulse (!) 119   Temp (!) 102.7 F (39.3 C) (Oral)   Resp (!) 24   Ht 6\' 1"  (1.854 m)   Wt 117 kg   SpO2 100%   BMI 34.04 kg/m   Physical Exam Vitals signs and nursing note reviewed.  Constitutional:      General: He is not in acute distress.    Appearance: He is well-developed and normal weight. He is not ill-appearing or diaphoretic.  HENT:     Head: Normocephalic and atraumatic.     Right Ear: Tympanic membrane and ear canal normal.     Left Ear: Tympanic membrane and ear canal normal.     Nose: Congestion and rhinorrhea present.     Comments: Bilateral nares patent with moderate mucosal edema and clear rhinorrhea present.     Mouth/Throat:     Mouth: Mucous membranes are moist.     Pharynx: Uvula midline. Pharyngeal swelling, oropharyngeal exudate and posterior oropharyngeal erythema present.     Tonsils: Tonsillar exudate present. No tonsillar abscesses. Swelling: 3+ on the right. 3+ on the left.     Comments: Mucous membranes are moist.  Posterior oropharynx is erythematous, bilateral tonsils with 3+ edema and exudates present, uvula is midline, there is no peritonsillar swelling to suggest PTA.  Normal phonation, tolerating secretions without difficulty. Eyes:     General:        Right eye: No discharge.        Left eye: No discharge.  Neck:     Musculoskeletal: Neck supple.     Comments: No rigidity, no stridor on auscultation.  Mild cervical lymphadenopathy. Cardiovascular:     Rate and Rhythm: Normal rate and regular rhythm.     Heart sounds: Normal heart sounds. No murmur. No friction rub. No gallop.   Pulmonary:     Effort: Pulmonary effort is normal. No respiratory distress.     Breath sounds: Normal breath sounds.     Comments: Respirations equal and  unlabored, patient able to speak in full sentences, lungs clear to auscultation bilaterally Abdominal:     General: Bowel sounds are normal. There is no distension.     Palpations: Abdomen is soft. There is no mass.     Tenderness: There is no abdominal tenderness. There is no guarding.     Comments: Abdomen soft, nondistended, nontender to palpation in all quadrants without guarding or peritoneal signs  Musculoskeletal:        General: No deformity.  Lymphadenopathy:     Cervical: No cervical adenopathy.  Skin:    General: Skin is warm and dry.     Capillary Refill: Capillary refill takes less than 2 seconds.  Neurological:     Mental Status: He is alert and  oriented to person, place, and time.  Psychiatric:        Mood and Affect: Mood normal.        Behavior: Behavior normal.      ED Treatments / Results  Labs (all labs ordered are listed, but only abnormal results are displayed) Labs Reviewed  COMPREHENSIVE METABOLIC PANEL - Abnormal; Notable for the following components:      Result Value   Glucose, Bld 106 (*)    All other components within normal limits  URINALYSIS, ROUTINE W REFLEX MICROSCOPIC - Abnormal; Notable for the following components:   Color, Urine AMBER (*)    Ketones, ur 5 (*)    All other components within normal limits  GROUP A STREP BY PCR  LIPASE, BLOOD  CBC  INFLUENZA PANEL BY PCR (TYPE A & B)    EKG None  Radiology Dg Chest 2 View  Result Date: 09/22/2018 CLINICAL DATA:  Productive cough, fever and sore throat. Positive smoking history. EXAM: CHEST - 2 VIEW COMPARISON:  05/17/2018 FINDINGS: The heart size and mediastinal contours are within normal limits. Both lungs are clear. The visualized skeletal structures are unremarkable. IMPRESSION: No active cardiopulmonary disease. Electronically Signed   By: Tollie Eth M.D.   On: 09/22/2018 21:01    Procedures Procedures (including critical care time)  Medications Ordered in ED Medications    sodium chloride flush (NS) 0.9 % injection 3 mL (3 mLs Intravenous Given 09/22/18 2201)  acetaminophen (TYLENOL) tablet 650 mg (650 mg Oral Given 09/22/18 2051)  sodium chloride 0.9 % bolus 1,000 mL (0 mLs Intravenous Stopped 09/22/18 2246)  ketorolac (TORADOL) 30 MG/ML injection 30 mg (30 mg Intravenous Given 09/22/18 2156)  penicillin g procaine-penicillin g benzathine (BICILLIN-CR) injection 600000-600000 units (1.2 Million Units Intramuscular Given 09/22/18 2202)  dexamethasone (DECADRON) injection 10 mg (10 mg Intravenous Given 09/22/18 2157)     Initial Impression / Assessment and Plan / ED Course  I have reviewed the triage vital signs and the nursing notes.  Pertinent labs & imaging results that were available during my care of the patient were reviewed by me and considered in my medical decision making (see chart for details).  23 year old male presents for evaluation of sore throat, fever, body aches, chills and diarrhea.  Symptoms have been present and worsening over the past week.  On arrival patient is febrile to 102.7 and tachycardic at 119 with mild tachypnea but despite this is overall well-appearing.  On exam he has bilateral tonsillar swelling with exudates concerning for strep throat would explain patient's mild GI symptoms and associated headache.  He has no signs of PTA or RPA on exam.  He is tolerating his secretions well with normal phonation.  Lungs are clear to auscultation.  Despite diarrhea and mild nausea patient denies abdominal pain and has no focal abdominal tenderness on exam.  Abdominal labs, CXR and strep PCR ordered from triage, will also add on influenza panel, and give fluids and medications for symptomatic treatment.  Labs overall reassuring, no leukocytosis, normal hemoglobin, no acute electrolyte derangements, normal renal and liver function and normal lipase.  Strep PCR is negative although patient's presentation is very classic of strep throat and given that he  is febrile with 3+ tonsillar edema, because I did not collect the swab myself I will treat him with penicillin for strep given his exam.  Influenza test is negative and chest x-ray shows no evidence of pneumonia or other active cardiopulmonary disease.  After medications given in  the emergency department patient reports significant improvement in his symptoms, tachycardia and fever have improved.  At this time patient is stable for discharge home, discussed continued symptomatic treatment and return precautions.  PCP follow-up encouraged.  Patient expresses understanding and agreement with plan.  Discharged home in good condition.  Vitals:   09/22/18 2052 09/22/18 2245  BP:  128/70  Pulse:  90  Resp:  16  Temp: (!) 100.4 F (38 C)   SpO2:  100%    Final Clinical Impressions(s) / ED Diagnoses   Final diagnoses:  Strep pharyngitis  Dehydration  Diarrhea, unspecified type    ED Discharge Orders    None       Legrand RamsFord, Alora Gorey N, PA-C 09/25/18 1811    Terrilee FilesButler, Michael C, MD 09/26/18 [phone]

## 2018-09-26 DIAGNOSIS — R1084 Generalized abdominal pain: Secondary | ICD-10-CM | POA: Diagnosis not present

## 2018-09-26 DIAGNOSIS — A084 Viral intestinal infection, unspecified: Secondary | ICD-10-CM | POA: Diagnosis not present

## 2018-09-26 DIAGNOSIS — R112 Nausea with vomiting, unspecified: Secondary | ICD-10-CM | POA: Diagnosis not present

## 2018-10-11 ENCOUNTER — Other Ambulatory Visit: Payer: Self-pay

## 2018-10-11 ENCOUNTER — Encounter (HOSPITAL_COMMUNITY): Payer: Self-pay | Admitting: Emergency Medicine

## 2018-10-11 ENCOUNTER — Emergency Department (HOSPITAL_COMMUNITY)
Admission: EM | Admit: 2018-10-11 | Discharge: 2018-10-13 | Disposition: A | Discharge status: Home / Self Care | Payer: Medicaid Other | Attending: Emergency Medicine | Admitting: Emergency Medicine

## 2018-10-11 ENCOUNTER — Emergency Department (HOSPITAL_COMMUNITY): Payer: Medicaid Other

## 2018-10-11 DIAGNOSIS — R1110 Vomiting, unspecified: Secondary | ICD-10-CM

## 2018-10-11 DIAGNOSIS — R1010 Upper abdominal pain, unspecified: Secondary | ICD-10-CM | POA: Diagnosis not present

## 2018-10-11 DIAGNOSIS — Z79899 Other long term (current) drug therapy: Secondary | ICD-10-CM | POA: Insufficient documentation

## 2018-10-11 DIAGNOSIS — F329 Major depressive disorder, single episode, unspecified: Secondary | ICD-10-CM | POA: Insufficient documentation

## 2018-10-11 DIAGNOSIS — F17210 Nicotine dependence, cigarettes, uncomplicated: Secondary | ICD-10-CM | POA: Diagnosis not present

## 2018-10-11 DIAGNOSIS — R45851 Suicidal ideations: Secondary | ICD-10-CM | POA: Insufficient documentation

## 2018-10-11 DIAGNOSIS — F251 Schizoaffective disorder, depressive type: Secondary | ICD-10-CM | POA: Diagnosis not present

## 2018-10-11 DIAGNOSIS — R531 Weakness: Secondary | ICD-10-CM | POA: Diagnosis not present

## 2018-10-11 DIAGNOSIS — R112 Nausea with vomiting, unspecified: Secondary | ICD-10-CM | POA: Diagnosis not present

## 2018-10-11 DIAGNOSIS — R109 Unspecified abdominal pain: Secondary | ICD-10-CM | POA: Diagnosis not present

## 2018-10-11 DIAGNOSIS — R1013 Epigastric pain: Secondary | ICD-10-CM | POA: Diagnosis not present

## 2018-10-11 HISTORY — DX: Schizoaffective disorder, unspecified: F25.9

## 2018-10-11 LAB — COMPREHENSIVE METABOLIC PANEL
ALK PHOS: 76 U/L (ref 38–126)
ALT: 29 U/L (ref 0–44)
AST: 20 U/L (ref 15–41)
Albumin: 4.3 g/dL (ref 3.5–5.0)
Anion gap: 13 (ref 5–15)
BILIRUBIN TOTAL: 1.5 mg/dL — AB (ref 0.3–1.2)
BUN: 12 mg/dL (ref 6–20)
CALCIUM: 10.4 mg/dL — AB (ref 8.9–10.3)
CO2: 24 mmol/L (ref 22–32)
CREATININE: 1.34 mg/dL — AB (ref 0.61–1.24)
Chloride: 98 mmol/L (ref 98–111)
Glucose, Bld: 93 mg/dL (ref 70–99)
Potassium: 4.3 mmol/L (ref 3.5–5.1)
Sodium: 135 mmol/L (ref 135–145)
TOTAL PROTEIN: 7.8 g/dL (ref 6.5–8.1)

## 2018-10-11 LAB — CBC
HCT: 49.2 % (ref 39.0–52.0)
Hemoglobin: 16.8 g/dL (ref 13.0–17.0)
MCH: 27.9 pg (ref 26.0–34.0)
MCHC: 34.1 g/dL (ref 30.0–36.0)
MCV: 81.6 fL (ref 80.0–100.0)
NRBC: 0 % (ref 0.0–0.2)
PLATELETS: 497 10*3/uL — AB (ref 150–400)
RBC: 6.03 MIL/uL — ABNORMAL HIGH (ref 4.22–5.81)
RDW: 11.5 % (ref 11.5–15.5)
WBC: 6.8 10*3/uL (ref 4.0–10.5)

## 2018-10-11 LAB — LIPASE, BLOOD: Lipase: 66 U/L — ABNORMAL HIGH (ref 11–51)

## 2018-10-11 LAB — INFLUENZA PANEL BY PCR (TYPE A & B)
INFLBPCR: NEGATIVE
Influenza A By PCR: NEGATIVE

## 2018-10-11 LAB — MONONUCLEOSIS SCREEN: Mono Screen: NEGATIVE

## 2018-10-11 MED ORDER — ESCITALOPRAM OXALATE 10 MG PO TABS
20.0000 mg | ORAL_TABLET | Freq: Every day | ORAL | Status: DC
  Administered 2018-10-12 – 2018-10-13 (×2): 20 mg via ORAL
  Filled 2018-10-11 (×2): qty 2

## 2018-10-11 MED ORDER — SODIUM CHLORIDE 0.9 % IV BOLUS
1000.0000 mL | Freq: Once | INTRAVENOUS | Status: AC
  Administered 2018-10-11: 1000 mL via INTRAVENOUS

## 2018-10-11 MED ORDER — ONDANSETRON 4 MG PO TBDP
4.0000 mg | ORAL_TABLET | Freq: Once | ORAL | Status: AC | PRN
Start: 2018-10-11 — End: 2018-10-11
  Administered 2018-10-11: 4 mg via ORAL
  Filled 2018-10-11: qty 1

## 2018-10-11 MED ORDER — SODIUM CHLORIDE 0.9% FLUSH
3.0000 mL | Freq: Once | INTRAVENOUS | Status: AC
  Administered 2018-10-11: 3 mL via INTRAVENOUS

## 2018-10-11 MED ORDER — HYDROXYZINE HCL 25 MG PO TABS
25.0000 mg | ORAL_TABLET | Freq: Four times a day (QID) | ORAL | Status: DC | PRN
  Administered 2018-10-12: 25 mg via ORAL
  Filled 2018-10-11: qty 1

## 2018-10-11 MED ORDER — ACETAMINOPHEN 500 MG PO TABS
1000.0000 mg | ORAL_TABLET | Freq: Once | ORAL | Status: AC
  Administered 2018-10-12: 1000 mg via ORAL
  Filled 2018-10-11: qty 2

## 2018-10-11 MED ORDER — ONDANSETRON HCL 4 MG/2ML IJ SOLN
4.0000 mg | Freq: Once | INTRAMUSCULAR | Status: AC
  Administered 2018-10-11: 4 mg via INTRAVENOUS
  Filled 2018-10-11: qty 2

## 2018-10-11 MED ORDER — TRAZODONE HCL 50 MG PO TABS
50.0000 mg | ORAL_TABLET | Freq: Every day | ORAL | Status: DC
  Administered 2018-10-12: 50 mg via ORAL
  Filled 2018-10-11: qty 1

## 2018-10-11 MED ORDER — ARIPIPRAZOLE 5 MG PO TABS
15.0000 mg | ORAL_TABLET | Freq: Every day | ORAL | Status: DC
  Administered 2018-10-12 – 2018-10-13 (×2): 15 mg via ORAL
  Filled 2018-10-11 (×2): qty 1

## 2018-10-11 NOTE — ED Provider Notes (Addendum)
[facility] EMERGENCY DEPARTMENT Provider Note   CSN: [id number] Arrival date & time: 10/11/18  1859    History   Chief Complaint Chief Complaint  Patient presents with  . Emesis  . Abdominal Pain    HPI Marvin Crawford is a 23 y.o. male.     Patient with history of marijuana use, ADD, anxiety, schizoaffective disorder presents with recurrent nausea vomiting abdominal pain.  Worse after eating.  Diffuse pain.  Worse in the epigastric region.  Low-grade fever today.     Past Medical History:  Diagnosis Date  . ADD (attention deficit disorder)   . Anxiety   . Depression   . Environmental allergies     Patient Active Problem List   Diagnosis Date Noted  . Schizoaffective disorder (HCC) 07/06/2017  . Major depressive disorder, recurrent, severe with psychotic features (HCC) 07/05/2017  . 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 Stuttering 08/09/2015  . Cannabis use disorder, moderate, dependence (HCC) 08/09/2015  . MDD (major depressive disorder), recurrent, severe, with psychosis (HCC) 02/16/2013    History reviewed. No pertinent surgical history.      Home Medications    Prior to Admission medications   Medication Sig Start Date End Date Taking? Authorizing Provider  ARIPiprazole (ABILIFY) 15 MG tablet Take 1 tablet (15 mg total) by mouth daily. For mood control 08/19/17   Charm Rings, NP  escitalopram (LEXAPRO) 20 MG tablet Take 1 tablet (20 mg total) by mouth daily. 08/20/17   Charm Rings, NP  fluticasone (FLONASE) 50 MCG/ACT nasal spray Place 1 spray into both nostrils daily. 05/17/18   Henderly, Britni A, PA-C  hydrOXYzine (ATARAX/VISTARIL) 25 MG tablet Take 1 tablet (25 mg total) by mouth every 6 (six) hours as needed for anxiety. 08/19/17   Charm Rings, NP  traZODone (DESYREL) 100 MG tablet Take 1 tablet (100 mg) by mouth at bedtime: For sleep 08/19/17   Charm Rings, NP    Family History Family History  Problem  Relation Age of Onset  . Hypertension Mother   . Hypertension Father   . Mental illness Neg Hx     Social History Social History   Tobacco Use  . Smoking status: Current Every Day Smoker    Packs/day: 2.00  . Smokeless tobacco: Never Used  Substance Use Topics  . Alcohol use: No    Alcohol/week: 0.0 standard drinks  . Drug use: Yes    Types: Other-see comments, Marijuana    Comment: oil smoked in pipe calls "DAB", friends buy for him     Allergies   Patient has no known allergies.   Review of Systems Review of Systems  Constitutional: Positive for fever and unexpected weight change. Negative for chills.  HENT: Negative for congestion.   Eyes: Negative for visual disturbance.  Respiratory: Negative for shortness of breath.   Cardiovascular: Negative for chest pain.  Gastrointestinal: Positive for abdominal pain, nausea and vomiting.  Genitourinary: Positive for dysuria. Negative for flank pain.  Musculoskeletal: Negative for back pain, neck pain and neck stiffness.  Skin: Negative for rash.  Neurological: Negative for light-headedness and headaches.     Physical Exam Updated Vital Signs BP 133/84   Pulse 91   Temp 99.9 F (37.7 C) (Oral)   Resp 16   Ht  (1.854 m)   Wt 117 kg   SpO2 96%   BMI 34.03 kg/m   Physical Exam Vitals signs and nursing note reviewed.  Constitutional:  Appearance: He is well-developed.  HENT:     Head: Normocephalic and atraumatic.  Eyes:     General:        Right eye: No discharge.        Left eye: No discharge.     Conjunctiva/sclera: Conjunctivae normal.  Neck:     Musculoskeletal: Normal range of motion and neck supple.     Trachea: No tracheal deviation.  Cardiovascular:     Rate and Rhythm: Normal rate and regular rhythm.  Pulmonary:     Effort: Pulmonary effort is normal.     Breath sounds: Normal breath sounds.  Abdominal:     General: There is no distension.     Palpations: Abdomen is soft.      Tenderness: There is no abdominal tenderness. There is no guarding.  Skin:    General: Skin is warm.     Findings: No rash.  Neurological:     Mental Status: He is alert and oriented to person, place, and time.  Psychiatric:        Mood and Affect: Mood is anxious.        Thought Content: Thought content includes suicidal ideation. Thought content includes suicidal plan.      ED Treatments / Results  Labs (all labs ordered are listed, but only abnormal results are displayed) Labs Reviewed  LIPASE, BLOOD - Abnormal; Notable for the following components:      Result Value   Lipase 66 (*)    All other components within normal limits  COMPREHENSIVE METABOLIC PANEL - Abnormal; Notable for the following components:   Creatinine, Ser 1.34 (*)    Calcium 10.4 (*)    Total Bilirubin 1.5 (*)    All other components within normal limits  CBC - Abnormal; Notable for the following components:   RBC 6.03 (*)    Platelets 497 (*)    All other components within normal limits  MONONUCLEOSIS SCREEN  INFLUENZA PANEL BY PCR (TYPE A & B)  URINALYSIS, ROUTINE W REFLEX MICROSCOPIC  RPR  HIV ANTIBODY (ROUTINE TESTING W REFLEX)  RAPID URINE DRUG SCREEN, HOSP PERFORMED  SALICYLATE LEVEL  ETHANOL  ACETAMINOPHEN LEVEL  GC/CHLAMYDIA PROBE AMP ([place]) NOT AT [facility]    EKG EKG Interpretation  Date/Time:  Friday October 11 2018 20:23:42 EST Ventricular Rate:  127 PR Interval:  126 QRS Duration: 92 QT Interval:  318 QTC Calculation: 462 R Axis:   23 Text Interpretation:  Sinus tachycardia Otherwise normal ECG Confirmed by Blane Ohara [phone]) on 10/11/2018 10:04:58 PM   Radiology Dg Chest 2 View  Result Date: 10/11/2018 CLINICAL DATA:  Fever nausea vomiting EXAM: CHEST - 2 VIEW COMPARISON:  09/22/2018 FINDINGS: The heart size and mediastinal contours are within normal limits. Both lungs are clear. The visualized skeletal structures are unremarkable. IMPRESSION: No active cardiopulmonary  disease. Electronically Signed   By: Jasmine Pang M.D.   On: 10/11/2018 22:40    Procedures Procedures (including critical care time)  Medications Ordered in ED Medications  sodium chloride flush (NS) 0.9 % injection 3 mL (has no administration in time range)  ARIPiprazole (ABILIFY) tablet 15 mg (has no administration in time range)  escitalopram (LEXAPRO) tablet 20 mg (has no administration in time range)  hydrOXYzine (ATARAX/VISTARIL) tablet 25 mg (has no administration in time range)  traZODone (DESYREL) tablet 50 mg (has no administration in time range)  acetaminophen (TYLENOL) tablet 1,000 mg (has no administration in time range)  ondansetron (ZOFRAN-ODT) disintegrating tablet 4  mg (4 mg Oral Given 10/11/18 2002)  sodium chloride 0.9 % bolus 1,000 mL (1,000 mLs Intravenous New Bag/Given 10/11/18 2252)  ondansetron (ZOFRAN) injection 4 mg (4 mg Intravenous Given 10/11/18 2253)     Initial Impression / Assessment and Plan / ED Course  I have reviewed the triage vital signs and the nursing notes.  Pertinent labs & imaging results that were available during my care of the patient were reviewed by me and considered in my medical decision making (see chart for details).       With multiple recent visits to different emergency rooms for similar abdominal pain vomiting and viral-like symptoms presents with recurrent symptoms.  Patient has had low-grade fever today and recurrent vomiting unable to keep anything down.  Patient had recent CT scan which results reviewed and no acute findings at [facility], blood work was done.  Patient does admit to unprotected sex with male and male recently.  Patient is concerned he may have HIV.  Patient has never been diagnosed with HIV.  Patient has generalized abdominal pain worse epigastric.  Patient nauseous in the room.  Blood work reviewed minimal elevated lipase, mild elevated platelets, mild elevated kidney function.  IV fluids and nausea medicines  given. Patient shared with nursing staff that he was now suicidal and wanted to end his life.  Patient has no active plan.  Patient request to go to behavioral health to be evaluated. Reviewed patient's recent work-ups and blood test, strep was negative, patient had blood work done recently unremarkable, CT scan reviewed.  Patient improved in the ER with IV fluids, heart rate improved.  With patient's weight loss, night sweats and fever HIV and other STD testing ordered.  Plan for antipyretics and pain meds as needed.  Oral fluid challenge.  Patient will need behavioral health assessment and will be at minimum observed overnight.  Patient's abdominal pain resolved on reassessment. Pt care signed out to fup [place] abd, blood work and reassess, TTS ordered.    Final Clinical Impressions(s) / ED Diagnoses   Final diagnoses:  Vomiting in adult  Suicidal ideation  Abdominal pain, unspecified abdominal location    ED Discharge Orders    None       Blane Ohara, MD 10/11/18 Joseph Pierini    Blane Ohara, MD 10/12/18 0003

## 2018-10-11 NOTE — ED Triage Notes (Signed)
Reports having n/v and abdominal pain for the last month after eating.  Was seen here previously.  Unsure what the diagnosis was.

## 2018-10-11 NOTE — ED Notes (Signed)
Pt noted to be spitting out oral secretions, as well as reporting ongoing N/V.

## 2018-10-11 NOTE — ED Notes (Signed)
Delay in lab draw,  Pt not in room at this time. 

## 2018-10-11 NOTE — ED Notes (Signed)
ED Provider at bedside. 

## 2018-10-11 NOTE — ED Notes (Signed)
Pt changed into paper scrubs and wanded by Security. All pt belongings placed into bags and given to pt's mother, who took them to her vehicle.   Pt continues to vomit and spit oral secretions. Reports feeling as though he is having a panic attack.

## 2018-10-11 NOTE — ED Notes (Signed)
Patient transported to X-ray 

## 2018-10-12 ENCOUNTER — Encounter (HOSPITAL_COMMUNITY): Payer: Self-pay | Admitting: *Deleted

## 2018-10-12 ENCOUNTER — Emergency Department (HOSPITAL_COMMUNITY): Payer: Medicaid Other

## 2018-10-12 DIAGNOSIS — R1010 Upper abdominal pain, unspecified: Secondary | ICD-10-CM | POA: Diagnosis not present

## 2018-10-12 LAB — HIV ANTIBODY (ROUTINE TESTING W REFLEX): HIV SCREEN 4TH GENERATION: NONREACTIVE

## 2018-10-12 LAB — BASIC METABOLIC PANEL
ANION GAP: 11 (ref 5–15)
BUN: 13 mg/dL (ref 6–20)
CO2: 20 mmol/L — ABNORMAL LOW (ref 22–32)
Calcium: 9.5 mg/dL (ref 8.9–10.3)
Chloride: 103 mmol/L (ref 98–111)
Creatinine, Ser: 1.13 mg/dL (ref 0.61–1.24)
GFR calc Af Amer: >60 mL/min (ref >60)
GFR calc non Af Amer: >60 mL/min (ref >60)
Glucose, Bld: 104 mg/dL — ABNORMAL HIGH (ref 70–99)
Potassium: 4.1 mmol/L (ref 3.5–5.1)
Sodium: 134 mmol/L — ABNORMAL LOW (ref 135–145)

## 2018-10-12 LAB — URINALYSIS, ROUTINE W REFLEX MICROSCOPIC
Bilirubin Urine: NEGATIVE
GLUCOSE, UA: NEGATIVE mg/dL
Hgb urine dipstick: NEGATIVE
KETONES UR: 80 mg/dL — AB
LEUKOCYTE UA: NEGATIVE
Nitrite: NEGATIVE
PH: 6 (ref 5.0–8.0)
Protein, ur: NEGATIVE mg/dL
Specific Gravity, Urine: 1.033 — ABNORMAL HIGH (ref 1.005–1.030)

## 2018-10-12 LAB — RAPID URINE DRUG SCREEN, HOSP PERFORMED
Amphetamines: NOT DETECTED
Barbiturates: NOT DETECTED
Benzodiazepines: NOT DETECTED
Cocaine: NOT DETECTED
OPIATES: NOT DETECTED
Tetrahydrocannabinol: NOT DETECTED

## 2018-10-12 LAB — ETHANOL: Alcohol, Ethyl (B): <10 mg/dL (ref <10)

## 2018-10-12 LAB — ACETAMINOPHEN LEVEL: Acetaminophen (Tylenol), Serum: <10 ug/mL — ABNORMAL LOW (ref 10–30)

## 2018-10-12 LAB — RPR: RPR Ser Ql: NONREACTIVE

## 2018-10-12 LAB — SALICYLATE LEVEL: Salicylate Lvl: <7 mg/dL (ref 2.8–30.0)

## 2018-10-12 MED ORDER — HALOPERIDOL LACTATE 5 MG/ML IJ SOLN: 2.0000 mg | Freq: Once | INTRAMUSCULAR | Status: DC

## 2018-10-12 MED ORDER — SODIUM CHLORIDE 0.9 % IV SOLN
Freq: Once | INTRAVENOUS | Status: AC
  Administered 2018-10-12: 17:00:00 via INTRAVENOUS

## 2018-10-12 MED ORDER — METOCLOPRAMIDE HCL 10 MG PO TABS: 10.0000 mg | ORAL_TABLET | Freq: Three times a day (TID) | ORAL | Status: DC | PRN

## 2018-10-12 MED ORDER — ONDANSETRON 4 MG PO TBDP: 8.0000 mg | ORAL_TABLET | Freq: Three times a day (TID) | ORAL | Status: DC | PRN

## 2018-10-12 MED ORDER — SODIUM CHLORIDE 0.9 % IV BOLUS
1000.0000 mL | Freq: Once | INTRAVENOUS | Status: AC
  Administered 2018-10-12: 1000 mL via INTRAVENOUS

## 2018-10-12 MED ORDER — CAPSAICIN 0.075 % EX CREA
TOPICAL_CREAM | Freq: Two times a day (BID) | CUTANEOUS | Status: DC | PRN
  Filled 2018-10-12: qty 60

## 2018-10-12 MED ORDER — HALOPERIDOL LACTATE 5 MG/ML IJ SOLN
2.5000 mg | Freq: Once | INTRAMUSCULAR | Status: AC
  Administered 2018-10-12: 2.5 mg via INTRAVENOUS
  Filled 2018-10-12: qty 1

## 2018-10-12 MED ORDER — METOCLOPRAMIDE HCL 5 MG/ML IJ SOLN
10.0000 mg | Freq: Once | INTRAMUSCULAR | Status: AC
  Administered 2018-10-12: 10 mg via INTRAVENOUS
  Filled 2018-10-12: qty 2

## 2018-10-12 MED ORDER — CAPSICUM OLEORESIN 0.025 % EX CREA: TOPICAL_CREAM | Freq: Two times a day (BID) | CUTANEOUS | Status: DC | PRN

## 2018-10-12 MED ORDER — CAPSAICIN 0.025 % EX CREA
TOPICAL_CREAM | Freq: Two times a day (BID) | CUTANEOUS | Status: DC | PRN
  Administered 2018-10-13: 1 via TOPICAL
  Filled 2018-10-12 (×2): qty 60

## 2018-10-12 NOTE — ED Notes (Signed)
Pt called out, reporting that he is experiencing an anxiety attack. Too soon for ordered PRN hydroxyzine (last given at 0000). MD notified.

## 2018-10-12 NOTE — ED Notes (Signed)
Breakfast Tray ordered  

## 2018-10-12 NOTE — ED Provider Notes (Signed)
I was notified that patient had vomiting.  I reviewed his records.  Is a history of what sounds like possibly cyclical vomiting versus cannabis hyperemesis.  He does appear dehydrated on his lab work.  Minimal abdominal tenderness noted on my examination.  Patient given a dose of Haldol as well as fluids with excellent improvement.  Will plan to repeat his BMP given his ongoing vomiting, and reassess.  At this time, he has had multiple CTs and imaging for this and I do not suspect he needs repeat imaging unless symptoms persist or he develops any persistent vital sign abnormalities.   Shaune Pollack, MD 10/12/18 [phone]

## 2018-10-12 NOTE — ED Notes (Signed)
Pt states that he dropped the urine cup and that it spilled on the floor of the bathroom. Urinal left at bedside with instructions for pt to use when he needed to void next.

## 2018-10-12 NOTE — ED Notes (Signed)
Pt speaking with TTS presently.

## 2018-10-12 NOTE — ED Notes (Signed)
Patient transported to Ultrasound 

## 2018-10-12 NOTE — BH Assessment (Signed)
Received TTS consult request. Spoke to Rushie Goltz, RN who said Pt is currently receiving ultrasound and he will call TTS when Pt is ready.   Pamalee Leyden, [facility], [facility], [facility] Triage Specialist [phone]

## 2018-10-12 NOTE — Progress Notes (Signed)
Patient meets criteria for inpatient treatment. No appropriate or available beds at [facility]. CSW faxed referrals to the following facilities for review:  [facility]  [facility]  [facility]  [facility]  [facility]  [facility]  [facility]  [facility]  [facility]  [facility]  [facility]  [facility]  [facility]  [facility]  [facility]  [facility]  [facility]  [facility]  [facility]  [facility]   TTS will continue to seek bed placement.  Vilma Meckel. Algis Greenhouse, MSW, LCSW Clinical Social Work/Disposition Phone: [phone] Fax: [phone]
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
## 2018-10-12 NOTE — ED Notes (Signed)
Pt vomited approx 300 ml of vomit.  MD placed order for reglan.  Mother at bedside.

## 2018-10-12 NOTE — ED Notes (Signed)
Pt spitting in bag, not vomiting.  Requesting apple juice. Eating breakfast.

## 2018-10-12 NOTE — ED Notes (Signed)
TTS assessment underway.  

## 2018-10-12 NOTE — ED Notes (Signed)
TTS device at bedside, pt ready for assessment.

## 2018-10-12 NOTE — ED Notes (Signed)
Pt still reportedly unable to void again for urine testing. Urinal at bedside and sitter aware of need for specimen.

## 2018-10-12 NOTE — BH Assessment (Signed)
Patient reassessed on this date;  Patient just vomited prior to reassessment.  He presented orientated x3, mood "not good", affect depressed.  Patient denied SI and HI.  He reports VH of "black and white" objects and AH of voices, however unable to understand the words.  Patient reports not having an appetite and reports restful sleep from last night.  Per Reola Calkins, NP;  Patient will be observed overnight and reassessed in the morning.

## 2018-10-13 ENCOUNTER — Other Ambulatory Visit: Payer: Self-pay

## 2018-10-13 LAB — CBG MONITORING, ED: Glucose-Capillary: 105 mg/dL — ABNORMAL HIGH (ref 70–99)

## 2018-10-13 MED ORDER — TRAZODONE HCL 100 MG PO TABS: ORAL_TABLET | ORAL | 0 refills | Status: DC

## 2018-10-13 MED ORDER — HYDROXYZINE HCL 25 MG PO TABS: 25.0000 mg | ORAL_TABLET | Freq: Four times a day (QID) | ORAL | 0 refills | Status: DC | PRN

## 2018-10-13 MED ORDER — ESCITALOPRAM OXALATE 20 MG PO TABS: 20.0000 mg | ORAL_TABLET | Freq: Every day | ORAL | 0 refills | Status: DC

## 2018-10-13 MED ORDER — ARIPIPRAZOLE 15 MG PO TABS: 15.0000 mg | ORAL_TABLET | Freq: Every day | ORAL | 0 refills | Status: DC

## 2018-10-13 NOTE — Progress Notes (Signed)
Patient is seen by me via tele-psych and I have consulted with Dr. Lucianne Muss.  Patient denies any suicidal homicidal ideations.  Patient does report having some hallucinations but they are not commanding him to harm himself or harm anyone else.  Patient reports that he has not been taking medications and that there are things that used to make that his hallucinations worse was marijuana and alcohol but he has stopped using marijuana and alcohol for more than a month. Contacted patient's mother for collateral after verbal permission from patient.  Patient's mother reports that about a month ago the patient reported that he went to a clinic and was told that he had an STD of gonorrhea in his throat and then had nausea, vomiting, and excessive spitting.  Patient mother reports they have been to 4 different emergency rooms for this issue.  She states that he is progressively became worse.  She states then the patient made reports of having HIV and that was causing his nausea and vomiting.  She reports that the patient was being treated for mental health issues and his medications were tapered off.  She reports that his provider told her that his psychotic issues were due to marijuana and alcohol use.  She now agrees that it may not have been the marijuana and alcohol use in the past and that the patient may have a mental health issue.  Patient's mother also reports that the patient has not been diagnosed with IDD but does have developmental delays and had an IEP teacher when he was in school.  She agrees that the patient can be discharged home as he is not in any immediate crisis and she plans to follow-up with her provider.  I have requested the EDP to provide the patient with limited prescriptions for his Abilify and Lexapro and she is in agreement.  At this time patient does not meet inpatient criteria and is psychiatrically cleared.  I have contacted Elizabeth Sauer, PA-C and notified her of the recommendations and she is  in agreement.

## 2018-10-13 NOTE — ED Notes (Signed)
Breakfast Tray ordered  

## 2018-10-13 NOTE — ED Notes (Signed)
TTS in use. 

## 2018-10-13 NOTE — ED Provider Notes (Signed)
Spoke with behavioral health nurse practitioner, Reola Calkins, who is recommending discharge.  Patient has not been taking his home medications.  Travis recommending refill of Abilify, Lexapro, trazodone and Vistaril.  I have provided him with a two-week supply which should get him to his behavioral health follow-up appointment.  I have reevaluated the patient.  He has a benign abdominal exam.  Per nurse tech sitter, he was able to eat his breakfast this morning, although had very little of it.  He has not had any active emesis, but does continue to spit into his emesis bag.  He appears quite well.  Vitals are stable.  Feel he is appropriate for discharge.  Recommended both mental health and primary care follow-up.  All questions answered to the best my ability.   Neamiah Sciarra, Chase Picket, PA-C 10/13/18 1233    Linwood Dibbles, MD 10/15/18 [phone]

## 2018-10-13 NOTE — ED Notes (Signed)
Called lab re gc/chlam urine sample.  They stated

## 2018-10-13 NOTE — ED Notes (Signed)
Pt told sitter he was too weak to walk to the bathroom.  CBG 105.  I spoke with pt and asked him to attempt to try, that he was a strong, young man.  Pt stood and ambulated to bathroom without difficulty.  PA notified that pt has not eaten anything at all yesterday and today.

## 2018-10-13 NOTE — ED Notes (Signed)
Mother called to come get her son for D/C

## 2018-10-13 NOTE — ED Notes (Signed)
Soft diet ordered for pt. 

## 2018-10-13 NOTE — Discharge Instructions (Signed)
It was my pleasure taking care of you today!   I have refilled your home medications. Take them as directed.   It is very important that you follow up with both your behavioral health doctor and your primary care doctor.   If you do not have a primary care doctor, see the information below.   Return to ER for new or worsening symptoms, any additional concerns.   To find a primary care or specialty doctor please call [phone] or [phone] to access "[facility] Find a Doctor Service."  You may also go on the [facility] website at [email or web address]  There are also multiple [facility] and [facility] practices throughout the [place] that are frequently accepting new patients. You may find a clinic that is close to your home and contact them.  [facility] - [address] [place] [address] [phone]  [facility] in [place] (also locations in [place] and [place]) - [address] [employer] [place] [phone]  [facility] Department - [address] [place] [phone]

## 2018-10-14 ENCOUNTER — Encounter (HOSPITAL_COMMUNITY): Payer: Self-pay

## 2018-10-14 ENCOUNTER — Inpatient Hospital Stay (HOSPITAL_COMMUNITY)
Admission: RE | Admit: 2018-10-14 | Discharge: 2018-10-18 | DRG: 885 | Disposition: A | Discharge status: Other Acute Inpatient Hospital | Payer: 59 | Attending: Internal Medicine | Admitting: Internal Medicine

## 2018-10-14 DIAGNOSIS — R51 Headache: Secondary | ICD-10-CM | POA: Diagnosis not present

## 2018-10-14 DIAGNOSIS — F8081 Childhood onset fluency disorder: Secondary | ICD-10-CM | POA: Diagnosis present

## 2018-10-14 DIAGNOSIS — F251 Schizoaffective disorder, depressive type: Secondary | ICD-10-CM | POA: Diagnosis present

## 2018-10-14 DIAGNOSIS — R42 Dizziness and giddiness: Secondary | ICD-10-CM | POA: Diagnosis not present

## 2018-10-14 DIAGNOSIS — Z560 Unemployment, unspecified: Secondary | ICD-10-CM

## 2018-10-14 DIAGNOSIS — I10 Essential (primary) hypertension: Secondary | ICD-10-CM | POA: Diagnosis not present

## 2018-10-14 DIAGNOSIS — F17210 Nicotine dependence, cigarettes, uncomplicated: Secondary | ICD-10-CM | POA: Diagnosis present

## 2018-10-14 DIAGNOSIS — F23 Brief psychotic disorder: Secondary | ICD-10-CM | POA: Diagnosis not present

## 2018-10-14 DIAGNOSIS — R45851 Suicidal ideations: Secondary | ICD-10-CM | POA: Diagnosis present

## 2018-10-14 DIAGNOSIS — F1290 Cannabis use, unspecified, uncomplicated: Secondary | ICD-10-CM | POA: Diagnosis present

## 2018-10-14 DIAGNOSIS — Z8249 Family history of ischemic heart disease and other diseases of the circulatory system: Secondary | ICD-10-CM

## 2018-10-14 DIAGNOSIS — R112 Nausea with vomiting, unspecified: Secondary | ICD-10-CM | POA: Diagnosis not present

## 2018-10-14 DIAGNOSIS — E860 Dehydration: Secondary | ICD-10-CM | POA: Diagnosis present

## 2018-10-14 DIAGNOSIS — R109 Unspecified abdominal pain: Secondary | ICD-10-CM | POA: Diagnosis not present

## 2018-10-14 DIAGNOSIS — Z79899 Other long term (current) drug therapy: Secondary | ICD-10-CM | POA: Diagnosis not present

## 2018-10-14 DIAGNOSIS — E876 Hypokalemia: Secondary | ICD-10-CM | POA: Diagnosis not present

## 2018-10-14 DIAGNOSIS — I959 Hypotension, unspecified: Secondary | ICD-10-CM | POA: Diagnosis not present

## 2018-10-14 DIAGNOSIS — F4310 Post-traumatic stress disorder, unspecified: Secondary | ICD-10-CM | POA: Diagnosis present

## 2018-10-14 DIAGNOSIS — F988 Other specified behavioral and emotional disorders with onset usually occurring in childhood and adolescence: Secondary | ICD-10-CM | POA: Diagnosis present

## 2018-10-14 DIAGNOSIS — Z7951 Long term (current) use of inhaled steroids: Secondary | ICD-10-CM | POA: Diagnosis not present

## 2018-10-14 DIAGNOSIS — A419 Sepsis, unspecified organism: Secondary | ICD-10-CM | POA: Diagnosis not present

## 2018-10-14 DIAGNOSIS — F70 Mild intellectual disabilities: Secondary | ICD-10-CM | POA: Diagnosis present

## 2018-10-14 DIAGNOSIS — F333 Major depressive disorder, recurrent, severe with psychotic symptoms: Secondary | ICD-10-CM | POA: Diagnosis not present

## 2018-10-14 DIAGNOSIS — R6510 Systemic inflammatory response syndrome (SIRS) of non-infectious origin without acute organ dysfunction: Secondary | ICD-10-CM | POA: Diagnosis not present

## 2018-10-14 LAB — GC/CHLAMYDIA PROBE AMP (~~LOC~~) NOT AT ARMC
Chlamydia: NEGATIVE
Neisseria Gonorrhea: NEGATIVE

## 2018-10-14 MED ORDER — MAGNESIUM HYDROXIDE 400 MG/5ML PO SUSP: 30.0000 mL | Freq: Every day | ORAL | Status: DC | PRN

## 2018-10-14 MED ORDER — ARIPIPRAZOLE 15 MG PO TABS
15.0000 mg | ORAL_TABLET | Freq: Every day | ORAL | Status: DC
  Administered 2018-10-15: 15 mg via ORAL
  Filled 2018-10-14 (×3): qty 1

## 2018-10-14 MED ORDER — HYDROXYZINE HCL 25 MG PO TABS
25.0000 mg | ORAL_TABLET | Freq: Three times a day (TID) | ORAL | Status: DC | PRN
  Administered 2018-10-14 – 2018-10-16 (×4): 25 mg via ORAL
  Filled 2018-10-14 (×3): qty 1

## 2018-10-14 MED ORDER — ALUM & MAG HYDROXIDE-SIMETH 200-200-20 MG/5ML PO SUSP: 30.0000 mL | ORAL | Status: DC | PRN

## 2018-10-14 MED ORDER — ONDANSETRON HCL 4 MG/2ML IJ SOLN
4.0000 mg | Freq: Once | INTRAMUSCULAR | Status: DC
  Filled 2018-10-14: qty 2

## 2018-10-14 MED ORDER — ESCITALOPRAM OXALATE 10 MG PO TABS
20.0000 mg | ORAL_TABLET | Freq: Every day | ORAL | Status: DC
  Administered 2018-10-15 – 2018-10-17 (×3): 20 mg via ORAL
  Filled 2018-10-14 (×3): qty 1
  Filled 2018-10-14: qty 2
  Filled 2018-10-14 (×2): qty 1

## 2018-10-14 MED ORDER — TRAZODONE HCL 100 MG PO TABS
100.0000 mg | ORAL_TABLET | Freq: Every evening | ORAL | Status: DC | PRN
  Administered 2018-10-14 – 2018-10-16 (×3): 100 mg via ORAL
  Filled 2018-10-14 (×4): qty 1

## 2018-10-14 MED ORDER — ONDANSETRON HCL 4 MG PO TABS
8.0000 mg | ORAL_TABLET | Freq: Three times a day (TID) | ORAL | Status: DC | PRN
  Administered 2018-10-16: 8 mg via ORAL
  Filled 2018-10-14 (×2): qty 2

## 2018-10-14 MED ORDER — LORAZEPAM 1 MG PO TABS
1.0000 mg | ORAL_TABLET | Freq: Once | ORAL | Status: DC
  Filled 2018-10-14: qty 1

## 2018-10-14 MED ORDER — ACETAMINOPHEN 325 MG PO TABS: 650.0000 mg | ORAL_TABLET | Freq: Four times a day (QID) | ORAL | Status: DC | PRN
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
## 2018-10-15 ENCOUNTER — Other Ambulatory Visit: Payer: Self-pay

## 2018-10-15 DIAGNOSIS — F23 Brief psychotic disorder: Secondary | ICD-10-CM

## 2018-10-15 LAB — COMPREHENSIVE METABOLIC PANEL
ALT: 32 U/L (ref 0–44)
ANION GAP: 9 (ref 5–15)
AST: 19 U/L (ref 15–41)
Albumin: 4.2 g/dL (ref 3.5–5.0)
Alkaline Phosphatase: 66 U/L (ref 38–126)
BUN: 14 mg/dL (ref 6–20)
CO2: 31 mmol/L (ref 22–32)
Calcium: 10 mg/dL (ref 8.9–10.3)
Chloride: 98 mmol/L (ref 98–111)
Creatinine, Ser: 1.27 mg/dL — ABNORMAL HIGH (ref 0.61–1.24)
GFR calc non Af Amer: >60 mL/min (ref >60)
Glucose, Bld: 104 mg/dL — ABNORMAL HIGH (ref 70–99)
POTASSIUM: 3.3 mmol/L — AB (ref 3.5–5.1)
Sodium: 138 mmol/L (ref 135–145)
Total Bilirubin: 0.6 mg/dL (ref 0.3–1.2)
Total Protein: 7.3 g/dL (ref 6.5–8.1)

## 2018-10-15 LAB — LIPID PANEL
CHOLESTEROL: 138 mg/dL (ref 0–200)
HDL: 29 mg/dL — ABNORMAL LOW (ref >40)
LDL Cholesterol: 100 mg/dL — ABNORMAL HIGH (ref 0–99)
Total CHOL/HDL Ratio: 4.8 RATIO
Triglycerides: 46 mg/dL (ref <150)
VLDL: 9 mg/dL (ref 0–40)

## 2018-10-15 LAB — CBC
HCT: 44.9 % (ref 39.0–52.0)
Hemoglobin: 14.7 g/dL (ref 13.0–17.0)
MCH: 28.1 pg (ref 26.0–34.0)
MCHC: 32.7 g/dL (ref 30.0–36.0)
MCV: 85.7 fL (ref 80.0–100.0)
Platelets: 360 10*3/uL (ref 150–400)
RBC: 5.24 MIL/uL (ref 4.22–5.81)
RDW: 11.8 % (ref 11.5–15.5)
WBC: 4.7 10*3/uL (ref 4.0–10.5)
nRBC: 0 % (ref 0.0–0.2)

## 2018-10-15 LAB — TSH: TSH: 0.879 u[IU]/mL (ref 0.350–4.500)

## 2018-10-15 LAB — HEMOGLOBIN A1C
Hgb A1c MFr Bld: 5.9 % — ABNORMAL HIGH (ref 4.8–5.6)
MEAN PLASMA GLUCOSE: 122.63 mg/dL

## 2018-10-15 MED ORDER — MAGIC MOUTHWASH
15.0000 mL | Freq: Three times a day (TID) | ORAL | Status: AC
  Administered 2018-10-15 – 2018-10-17 (×5): 15 mL via ORAL
  Filled 2018-10-15 (×6): qty 15

## 2018-10-15 MED ORDER — ARIPIPRAZOLE 2 MG PO TABS
2.0000 mg | ORAL_TABLET | Freq: Every day | ORAL | Status: DC
  Administered 2018-10-16 – 2018-10-17 (×2): 2 mg via ORAL
  Filled 2018-10-15 (×4): qty 1

## 2018-10-15 MED ORDER — ONDANSETRON 4 MG PO TBDP
4.0000 mg | ORAL_TABLET | Freq: Three times a day (TID) | ORAL | Status: AC
  Administered 2018-10-15 – 2018-10-17 (×5): 4 mg via ORAL
  Filled 2018-10-15 (×6): qty 1

## 2018-10-15 MED ORDER — NICOTINE 21 MG/24HR TD PT24
21.0000 mg | MEDICATED_PATCH | Freq: Every day | TRANSDERMAL | Status: DC
  Administered 2018-10-17: 21 mg via TRANSDERMAL
  Filled 2018-10-15 (×5): qty 1

## 2018-10-15 MED ORDER — BENZTROPINE MESYLATE 0.5 MG PO TABS
0.5000 mg | ORAL_TABLET | Freq: Two times a day (BID) | ORAL | Status: DC
  Administered 2018-10-15 – 2018-10-17 (×4): 0.5 mg via ORAL
  Filled 2018-10-15 (×8): qty 1

## 2018-10-15 MED ORDER — TRAZODONE HCL 100 MG PO TABS
100.0000 mg | ORAL_TABLET | Freq: Once | ORAL | Status: AC
  Administered 2018-10-15: 100 mg via ORAL
  Filled 2018-10-15: qty 1

## 2018-10-15 MED ORDER — HALOPERIDOL 5 MG PO TABS
5.0000 mg | ORAL_TABLET | Freq: Two times a day (BID) | ORAL | Status: DC
  Administered 2018-10-15 – 2018-10-17 (×4): 5 mg via ORAL
  Filled 2018-10-15 (×8): qty 1

## 2018-10-15 NOTE — Progress Notes (Signed)
Recreation Therapy Notes  INPATIENT RECREATION THERAPY ASSESSMENT  Patient Details Name: Marvin Crawford MRN: [id number] DOB: 1996-06-19 Today's Date: 10/15/2018       Information Obtained From: Patient  Able to Participate in Assessment/Interview: Yes  Patient Presentation: Alert  Reason for Admission (Per Patient): Other (Comments)(Pt stated negative thoughts/thinking)  Patient Stressors: Other (Comment), Work(Pt stated maintaining his room )  Coping Skills:   Isolation, Self-Injury, Journal, TV, Arguments, Music, Meditate, Deep Breathing, Substance Abuse, Impulsivity, Talk, Prayer, Avoidance, Read, Dance, Hot Bath/Shower  Leisure Interests (2+):  Individual - TV, Nature - Vegetable gardening  Frequency of Recreation/Participation: (Garden- Yearly; TV- Weekly)  Awareness of Community Resources:  Yes  Community Resources:  [place], [place]  Current Use: Yes  If no, Barriers?:    Expressed Interest in [employer] Information: No  [employer] of Residence:  Engineer, technical sales  Patient Main Form of Transportation: Set designer  Patient Strengths:  Counselling psychologist; Intelligent  Patient Identified Areas of Improvement:  Stop using drugs as a coping skill; Talk to more people  Patient Goal for Hospitalization:  "get the right medication"  Current SI (including self-harm):  No  Current HI:  Yes(Rated a 5; Contracts for safety)  Current AVH: Yes(Black/white images)  Staff Intervention Plan: Group Attendance, Collaborate with Interdisciplinary Treatment Team  Consent to Intern Participation: N/A    Caroll Rancher, LRT/CTRS  Lillia Abed, Shanel Prazak A 10/15/2018, 1:15 PM

## 2018-10-15 NOTE — Progress Notes (Signed)
Recreation Therapy Notes  Date: 3.10.20 Time: 1000 Location: 500 Hall Dayroom  Group Topic: Anger Management  Goal Area(s) Addresses:  Patient will identify triggers for anger.  Patient will identify a situation that makes them angry.  Patient will identify what other emotions comes with anger.   Intervention: Worksheet  Activity: Introduction to Anger Management.  Patients were given a worksheet in which they were to identify what leads them to feel angry, what they do when angry and problems their anger has caused.  Education: Anger Management, Discharge Planning   Education Outcome: Acknowledges education/In group clarification offered/Needs additional education.   Clinical Observations/Feedback: Patient did not attend group.     Coby Antrobus, LRT/CTRS         Lou Irigoyen A 10/15/2018 11:15 AM 
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
## 2018-10-15 NOTE — Progress Notes (Signed)
D: Pt denies SI/HI/AVH. Pt is pleasant and cooperative. Pt stated he had N/V earlier. Pt did not endorse any nausea this evening, but was observed with a cup he was using as spit receptacle. Pt mother called due to being concerned that pt was eating because he was not eating much prior to coming to the hospital, pt was seen with snacks this evening. Pt mother was thinking about scheduling family session.   A: Pt was offered support and encouragement. Pt was given scheduled medications. Pt was encourage to attend groups. Q 15 minute checks were done for safety.   R: safety maintained on unit.  Problem: Education: Goal: Emotional status will improve Outcome: Progressing   Problem: Activity: Goal: Interest or engagement in activities will improve Outcome: Progressing   Problem: Activity: Goal: Sleeping patterns will improve Outcome: Progressing

## 2018-10-15 NOTE — Progress Notes (Signed)
Did not attend group 

## 2018-10-15 NOTE — Tx Team (Signed)
Initial Treatment Plan 10/15/2018 1:24 AM Marvin Crawford JQZ:[id number]    PATIENT STRESSORS: Medication change or noncompliance Other: depression   PATIENT STRENGTHS: General fund of knowledge Supportive family/friends   PATIENT IDENTIFIED PROBLEMS: depression  SI/ HI/ AVH  "getting right medicine"                 DISCHARGE CRITERIA:  Ability to meet basic life and health needs Improved stabilization in mood, thinking, and/or behavior Verbal commitment to aftercare and medication compliance  PRELIMINARY DISCHARGE PLAN: Attend aftercare/continuing care group Attend PHP/IOP Outpatient therapy  PATIENT/FAMILY INVOLVEMENT: This treatment plan has been presented to and reviewed with the patient, Marvin Crawford.  The patient and family have been given the opportunity to ask questions and make suggestions.  Delos Haring, RN 10/15/2018, 1:24 AM

## 2018-10-15 NOTE — Progress Notes (Signed)
Patient has been isolative to room this shift.  Patient complains of feeling nauseous and in frequently seen spitting into a cup. Patient denies SI, HI and AVH.  Patient has been compliant with medications, but had limited interaction in groups.   Assess patient for safety, offer medications as planned, engage patient in 1:1 staff talks.  Continue to monitor as planned.

## 2018-10-15 NOTE — [facility] Counselor (Signed)
CSW attempted to complete PSA.  Pt said he was throwing up.  Will try again later. Garner Nash, MSW, LCSW Clinical Social Worker 10/15/2018 3:56 PM

## 2018-10-15 NOTE — [facility] Group Notes (Signed)
[facility] LCSW Group Therapy Note  Date/Time: 10/15/18, 1100  Type of Therapy/Topic:  Group Therapy:  Feelings about Diagnosis  Participation Level:  Did Not Attend   Mood:   Description of Group:    This group will allow patients to explore their thoughts and feelings about diagnoses they have received. Patients will be guided to explore their level of understanding and acceptance of these diagnoses. Facilitator will encourage patients to process their thoughts and feelings about the reactions of others to their diagnosis, and will guide patients in identifying ways to discuss their diagnosis with significant others in their lives. This group will be process-oriented, with patients participating in exploration of their own experiences as well as giving and receiving support and challenge from other group members.   Therapeutic Goals: 1. Patient will demonstrate understanding of diagnosis as evidence by identifying two or more symptoms of the disorder:  2. Patient will be able to express two feelings regarding the diagnosis 3. Patient will demonstrate ability to communicate their needs through discussion and/or role plays  Summary of Patient Progress:        Therapeutic Modalities:   Cognitive Behavioral Therapy Brief Therapy Feelings Identification   Daleen Squibb, LCSW

## 2018-10-15 NOTE — [facility] Suicide Risk Assessment (Signed)
[facility] Admission Suicide Risk Assessment   Nursing information obtained from:  Patient Demographic factors:  Male, Unemployed, Low socioeconomic status Current Mental Status:  Suicidal ideation indicated by patient, Suicide plan Loss Factors:  NA Historical Factors:  Prior suicide attempts Risk Reduction Factors:  Positive social support  Total Time spent with patient: 45 minutes Principal Problem: Exacerbation and underlying psychosis Diagnosis:  Active Problems:   Schizophrenia, acute (HCC)  Subjective Data: Marvin Crawford is 17 he presented with his mother they were concerned about schizoaffective exacerbation, medication noncompliance, thoughts of self-harm the patient himself states his goal is to "get back on the meds" which he described as Lexapro and Abilify but is also been excessively focused on some type of contamination in his mouth and is spitting multiple times per hour into a trash can.  He is doing that while here.  He acknowledges cannabis and alcohol abuse in the past.  Acknowledges recent auditory and visual hallucinations  Continued Clinical Symptoms:  Alcohol Use Disorder Identification Test Final Score (AUDIT): 2 The "Alcohol Use Disorders Identification Test", Guidelines for Use in Primary Care, Second Edition.  World Science writer [facility]). Score between 0-7:  no or low risk or alcohol related problems. Score between 8-15:  moderate risk of alcohol related problems. Score between 16-19:  high risk of alcohol related problems. Score 20 or above:  warrants further diagnostic evaluation for alcohol dependence and treatment.   CLINICAL FACTORS:   Alcohol/Substance Abuse/Dependencies COGNITIVE FEATURES THAT CONTRIBUTE TO RISK:  Loss of executive function    SUICIDE RISK:   Minimal: No identifiable suicidal ideation.  Patients presenting with no risk factors but with morbid ruminations; may be classified as minimal risk based on the severity of the depressive  symptoms  PLAN OF CARE: see and further eval  I certify that inpatient services furnished can reasonably be expected to improve the patient's condition.   Malvin Johns, MD 10/15/2018, 11:18 AM

## 2018-10-15 NOTE — Progress Notes (Signed)
Patient ID: Marvin Crawford, male   DOB: 10-07-1995, 22 y.o.   MRN: [id number] Admission Note:  23 yr male who presents [facility] in no acute distress for the treatment of SI/HI/ AVH and Depression. Pt appears flat and depressed. Pt was calm and cooperative with admission process. Pt presents with passive SI/ HI/ AVH and contracts for safety upon admission. Pt stated he was depressed / anxious for 1 month and decided to come in and get help. Pt stated his plan for SI was to jump out a window. Pt given a cup so he could spit into it constantly, pt did not explain why he had to spit into the cup constantly.   A:Skin was assessed and found to be clear of any abnormal marks. PT searched and no contraband found, POC and unit policies explained and understanding verbalized. Consents obtained. Food and fluids offered, and fluids accepted.  R: Pt had no additional questions or concerns.

## 2018-10-15 NOTE — H&P (Signed)
Behavioral Health Medical Screening Exam  Marvin Crawford is an 23 y.o. male.  Total Time spent with patient: 20 minutes  Psychiatric Specialty Exam: Physical Exam  Constitutional: He is oriented to person, place, and time. He appears well-developed and well-nourished. No distress.  HENT:  Head: Normocephalic and atraumatic.  Right Ear: External ear normal.  Left Ear: External ear normal.  Eyes: Pupils are equal, round, and reactive to light.  Respiratory: Effort normal. No respiratory distress.  Musculoskeletal: Normal range of motion.  Neurological: He is alert and oriented to person, place, and time.  Skin: Skin is warm and dry. He is not diaphoretic.  Psychiatric: His mood appears anxious. He is not withdrawn. Thought content is not paranoid and not delusional. He expresses impulsivity and inappropriate judgment. He exhibits a depressed mood. He expresses suicidal ideation. He expresses no homicidal ideation. He expresses suicidal plans.    Review of Systems  Constitutional: Negative for chills, diaphoresis, fever, malaise/fatigue and weight loss.  Respiratory: Negative for cough and shortness of breath.   Cardiovascular: Negative for chest pain.  Gastrointestinal: Positive for nausea and vomiting. Negative for diarrhea.  Psychiatric/Behavioral: Positive for depression, hallucinations, substance abuse and suicidal ideas. The patient is nervous/anxious and has insomnia.     There were no vitals taken for this visit.There is no height or weight on file to calculate BMI.  General Appearance: Casual and Fairly Groomed  Eye Contact:  Fair  Speech:  Clear and Coherent and Normal Rate  Volume:  Decreased  Mood:  Anxious, Depressed, Hopeless and Worthless  Affect:  Congruent and Depressed  Thought Process:  Coherent and Descriptions of Associations: Intact  Orientation:  Full (Time, Place, and Person)  Thought Content:  Logical and Hallucinations: Command:  Voices are telling him to  use a belt to hang himself  Suicidal Thoughts:  Yes.  with intent/plan  Homicidal Thoughts:  No  Memory:  Immediate;   Fair Recent;   Fair  Judgement:  Impaired  Insight:  Fair  Psychomotor Activity:  Normal  Concentration: Concentration: Fair and Attention Span: Fair  Recall:  Good  Fund of Knowledge:Good  Language: Good  Akathisia:  No  Handed:  Right  AIMS (if indicated):     Assets:  Desire for Improvement Financial Resources/Insurance Housing Intimacy Leisure Time Physical Health  Sleep:       Musculoskeletal: Strength & Muscle Tone: within normal limits Gait & Station: normal   Blood pressure 102/61, pulse (!) 104, temperature 98.9 F (37.2 C), resp. rate 18, SpO2 100 %.  Recommendations:  Based on my evaluation the patient does not appear to have an emergency medical condition.   Patient was recently medically cleared. N/V is an ongoing issue, possible cyclical related to marijuana use. Recently had CT and other imaging that were negative.  Jackelyn Poling, NP 10/15/2018, 12:45 AM

## 2018-10-16 LAB — PROLACTIN: PROLACTIN: 3.4 ng/mL — AB (ref 4.0–15.2)

## 2018-10-16 MED ORDER — ARIPIPRAZOLE ER 400 MG IM SRER
400.0000 mg | Freq: Once | INTRAMUSCULAR | Status: AC
  Administered 2018-10-16: 400 mg via INTRAMUSCULAR

## 2018-10-16 NOTE — Progress Notes (Signed)
   Patient has been isolative to room this shift.  Patient complains of feeling nauseous and in frequently seen spitting into a cup. Patient denies SI, HI and AVH.  Patient has been compliant with medications, but had limited interaction in groups.   Assess patient for safety, offer medications as planned, engage patient in 1:1 staff talks.  Continue to monitor as planned.

## 2018-10-16 NOTE — Progress Notes (Signed)
Adult Psychoeducational Group Note  Date:  10/16/2018 Time:  8:46 PM  Group Topic/Focus:  Wrap-Up Group:   The focus of this group is to help patients review their daily goal of treatment and discuss progress on daily workbooks.  Participation Level:  Active  Participation Quality:  Appropriate  Affect:  Appropriate  Cognitive:  Appropriate  Insight: Appropriate  Engagement in Group:  Engaged  Modes of Intervention:  Discussion  Additional Comments: The patient expressed that he attend all groups.the patient also said that he rates a 7. Octavio Manns 10/16/2018, 8:46 PM
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
## 2018-10-16 NOTE — Tx Team (Addendum)
Interdisciplinary Treatment and Diagnostic Plan Update  10/16/2018 Time of Session: [place] MRN: [id number]  Principal Diagnosis: <principal problem not specified>  Secondary Diagnoses: Active Problems:   Schizophrenia, acute ([place])   Current Medications:  Current Facility-Administered Medications  Medication Dose Route Frequency Provider Last Rate Last Dose  . acetaminophen (TYLENOL) tablet 650 mg  650 mg Oral Q6H PRN Lindon Romp A, NP      . alum & mag hydroxide-simeth (MAALOX/MYLANTA) 200-200-20 MG/5ML suspension 30 mL  30 mL Oral Q4H PRN Lindon Romp A, NP      . ARIPiprazole (ABILIFY) tablet 2 mg  2 mg Oral Daily Johnn Hai, MD   2 mg at 10/16/18 0810  . ARIPiprazole ER (ABILIFY MAINTENA) injection 400 mg  400 mg Intramuscular Once Johnn Hai, MD      . benztropine (COGENTIN) tablet 0.5 mg  0.5 mg Oral BID Johnn Hai, MD   0.5 mg at 10/16/18 0810  . escitalopram (LEXAPRO) tablet 20 mg  20 mg Oral Daily Lindon Romp A, NP   20 mg at 10/16/18 7510  . haloperidol (HALDOL) tablet 5 mg  5 mg Oral BID Johnn Hai, MD   5 mg at 10/16/18 0810  . hydrOXYzine (ATARAX/VISTARIL) tablet 25 mg  25 mg Oral TID PRN Rozetta Nunnery, NP   25 mg at 10/16/18 0809  . magic mouthwash  15 mL Oral TID Johnn Hai, MD   15 mL at 10/16/18 1157  . magnesium hydroxide (MILK OF MAGNESIA) suspension 30 mL  30 mL Oral Daily PRN Lindon Romp A, NP      . nicotine (NICODERM CQ - dosed in mg/24 hours) patch 21 mg  21 mg Transdermal Daily Lindon Romp A, NP      . ondansetron (ZOFRAN) tablet 8 mg  8 mg Oral Q8H PRN Lindon Romp A, NP   8 mg at 10/16/18 0319  . ondansetron (ZOFRAN-ODT) disintegrating tablet 4 mg  4 mg Oral TID Johnn Hai, MD   4 mg at 10/16/18 1157  . traZODone (DESYREL) tablet 100 mg  100 mg Oral QHS PRN Lindon Romp A, NP   100 mg at 10/15/18 2102   PTA Medications: Medications Prior to Admission  Medication Sig Dispense Refill Last Dose  . ARIPiprazole (ABILIFY) 15 MG tablet  Take 1 tablet (15 mg total) by mouth daily. For mood control 14 tablet 0   . escitalopram (LEXAPRO) 20 MG tablet Take 1 tablet (20 mg total) by mouth daily. 14 tablet 0   . fluticasone (FLONASE) 50 MCG/ACT nasal spray Place 1 spray into both nostrils daily. (Patient not taking: Reported on 10/15/2018) 11.1 g 0 Not Taking at Unknown time  . hydrOXYzine (ATARAX/VISTARIL) 25 MG tablet Take 1 tablet (25 mg total) by mouth every 6 (six) hours as needed for anxiety. (Patient not taking: Reported on 10/15/2018) 30 tablet 0 Not Taking at Unknown time  . traZODone (DESYREL) 100 MG tablet Take 1 tablet (100 mg) by mouth at bedtime: For sleep (Patient not taking: Reported on 10/15/2018) 14 tablet 0 Not Taking at Unknown time    Patient Stressors: Medication change or noncompliance Other: depression  Patient Strengths: General fund of knowledge Supportive family/friends  Treatment Modalities: Medication Management, Group therapy, Case management,  1 to 1 session with clinician, Psychoeducation, Recreational therapy.   Physician Treatment Plan for Primary Diagnosis: <principal problem not specified> Long Term Goal(s): Improvement in symptoms so as ready for discharge Improvement in symptoms so as ready for discharge  Short Term Goals: Ability to identify changes in lifestyle to reduce recurrence of condition will improve Ability to disclose and discuss suicidal ideas Compliance with prescribed medications will improve Ability to identify triggers associated with substance abuse/mental health issues will improve  Medication Management: Evaluate patient's response, side effects, and tolerance of medication regimen.  Therapeutic Interventions: 1 to 1 sessions, Unit Group sessions and Medication administration.  Evaluation of Outcomes: Not Met  Physician Treatment Plan for Secondary Diagnosis: Active Problems:   Schizophrenia, acute ([place])  Long Term Goal(s): Improvement in symptoms so as ready for  discharge Improvement in symptoms so as ready for discharge   Short Term Goals: Ability to identify changes in lifestyle to reduce recurrence of condition will improve Ability to disclose and discuss suicidal ideas Compliance with prescribed medications will improve Ability to identify triggers associated with substance abuse/mental health issues will improve     Medication Management: Evaluate patient's response, side effects, and tolerance of medication regimen.  Therapeutic Interventions: 1 to 1 sessions, Unit Group sessions and Medication administration.  Evaluation of Outcomes: Not Met   RN Treatment Plan for Primary Diagnosis: <principal problem not specified> Long Term Goal(s): Knowledge of disease and therapeutic regimen to maintain health will improve  Short Term Goals: Ability to identify and develop effective coping behaviors will improve and Compliance with prescribed medications will improve  Medication Management: RN will administer medications as ordered by provider, will assess and evaluate patient's response and provide education to patient for prescribed medication. RN will report any adverse and/or side effects to prescribing provider.  Therapeutic Interventions: 1 on 1 counseling sessions, Psychoeducation, Medication administration, Evaluate responses to treatment, Monitor vital signs and CBGs as ordered, Perform/monitor CIWA, COWS, AIMS and Fall Risk screenings as ordered, Perform wound care treatments as ordered.  Evaluation of Outcomes: Not Met   LCSW Treatment Plan for Primary Diagnosis: <principal problem not specified> Long Term Goal(s): Safe transition to appropriate next level of care at discharge, Engage patient in therapeutic group addressing interpersonal concerns.  Short Term Goals: Engage patient in aftercare planning with referrals and resources, Increase social support and Increase skills for wellness and recovery  Therapeutic Interventions: Assess  for all discharge needs, 1 to 1 time with Social worker, Explore available resources and support systems, Assess for adequacy in community support network, Educate family and significant other(s) on suicide prevention, Complete Psychosocial Assessment, Interpersonal group therapy.  Evaluation of Outcomes: Not Met   Progress in Treatment: Attending groups: No. Participating in groups: No. Taking medication as prescribed: Yes. Toleration medication: Yes. Family/Significant other contact made: No, will contact:  mother Patient understands diagnosis: No. Discussing patient identified problems/goals with staff: Yes. Medical problems stabilized or resolved: Yes. Denies suicidal/homicidal ideation: Yes. Issues/concerns per patient self-inventory: No. Other: none  New problem(s) identified: No, Describe:  none  New Short Term/Long Term Goal(s):  Patient Goals:  "get better and take the right medicine"  Discharge Plan or Barriers:   Reason for Continuation of Hospitalization: Delusions  Medication stabilization  Estimated Length of Stay: 2-4 days.  Attendees: Patient:Marvin Crawford 10/16/2018   Physician: Dr. Jake Samples, MD 10/16/2018   Nursing: Elesa Massed, RN 10/16/2018   RN Care Manager: 10/16/2018   Social Worker: Lurline Idol, LCSW 10/16/2018   Recreational Therapist:  10/16/2018   Other:  10/16/2018   Other:  10/16/2018   Other: 10/16/2018     Scribe for Treatment Team: Joanne Chars, LCSW 10/16/2018 1:39 PM

## 2018-10-16 NOTE — [facility] Suicide Risk Assessment (Signed)
[facility] INPATIENT:  Family/Significant Other Suicide Prevention Education  Suicide Prevention Education:  Education Completed;  Marvin Crawford, mother, [phone] been identified by the patient as the family member/significant other with whom the patient will be residing, and identified as the person(s) who will aid the patient in the event of a mental health crisis (suicidal ideations/suicide attempt).  With written consent from the patient, the family member/significant other has been provided the following suicide prevention education, prior to the and/or following the discharge of the patient.  The suicide prevention education provided includes the following:  Suicide risk factors  Suicide prevention and interventions  National Suicide Hotline telephone number  [facility] assessment telephone number  [facility] Emergency Assistance 911  [facility] and/or Residential Mobile Crisis Unit telephone number  Request made of family/significant other to:  Remove weapons (e.g., guns, rifles, knives), all items previously/currently identified as safety concern.  No guns in the home, per mother.   Remove drugs/medications (over-the-counter, prescriptions, illicit drugs), all items previously/currently identified as a safety concern.  The family member/significant other verbalizes understanding of the suicide prevention education information provided.  The family member/significant other agrees to remove the items of safety concern listed above.  Mother reports several recent stressors:some work stress, several relatives are currently staying with the family and it is somewhat crowded at home right now.  The spitting/throwing up things started a few weeks ago--pt has not been eating.  They have been to the ED 4x in the past month due to dehydration.  He has thrown up a few times but also has a bad taste in his mouth that he is trying to spit out.  [facility] Priority has been his  mental health provider--he had an appt for tomorrow to restart services as he had not been there for a little while.  Lorri Frederick, LCSW 10/16/2018, 2:57 PM
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
## 2018-10-16 NOTE — Progress Notes (Signed)
Recreation Therapy Notes  Date: 3.11.20 Time: 1000 Location: 500 Hall Dayroom  Group Topic: Communication, Team Building, Problem Solving  Goal Area(s) Addresses:  Patient will effectively work with peer towards shared goal.  Patient will identify skill used to make activity successful.  Patient will identify how skills used during activity can be used to reach post d/c goals.   Intervention: STEM Activity   Activity: In team's, using 20 plastic straws and 2 feet of masking tape, patients were asked to build a bridge that could stand on its own and hold a small box of cards.    Education: Pharmacist, community, Building control surveyor.   Education Outcome: Acknowledges education/In group clarification offered/Needs additional education.   Clinical Observations/Feedback: Pt did not attend group.     Caroll Rancher, LRT/CTRS         Caroll Rancher A 10/16/2018 10:53 AM

## 2018-10-16 NOTE — Progress Notes (Signed)
D: Pt passive SI/ [facility]- contracts for safety. Pt stated they were getting better. Pt is pleasant and cooperative. Pt isolated to his room this evening. Pt forwards little information, but was polite this evening. Pt did not express and stomach issues , but continues to spit in a cup.  A: Pt was offered support and encouragement. Pt was given sleep medications. Pt was encourage to attend groups. Q 15 minute checks were done for safety.  R:Pt is taking medication. Pt has no complaints.Pt receptive to treatment and safety maintained on unit.  Problem: Education: Goal: Emotional status will improve Outcome: Progressing   Problem: Activity: Goal: Interest or engagement in activities will improve Outcome: Not Progressing   Problem: Activity: Goal: Sleeping patterns will improve Outcome: Progressing   Problem: Coping: Goal: Ability to demonstrate self-control will improve Outcome: Progressing   Problem: Physical Regulation: Goal: Ability to maintain clinical measurements within normal limits will improve Outcome: Progressing

## 2018-10-17 ENCOUNTER — Encounter (HOSPITAL_COMMUNITY): Payer: Self-pay | Admitting: Emergency Medicine

## 2018-10-17 MED ORDER — TRAZODONE HCL 100 MG PO TABS
100.0000 mg | ORAL_TABLET | Freq: Once | ORAL | Status: AC
  Administered 2018-10-17: 100 mg via ORAL
  Filled 2018-10-17 (×2): qty 1

## 2018-10-17 MED ORDER — TRAZODONE HCL 50 MG PO TABS: 50.0000 mg | ORAL_TABLET | Freq: Every evening | ORAL | Status: DC | PRN

## 2018-10-17 NOTE — Progress Notes (Signed)
[facility] MD Progress Note  10/17/2018 12:06 PM Marvin Crawford  MRN:  [id number] Subjective:   Patient is very focused again on his saliva spitting last though he states he only wants to take Abilify, Lexapro, and trazodone which we will accommodate but we believe the long-acting injectable Abilify would work better than oral at any rate he is oriented and cooperative some poverty of content and very focused and obsessive type thinking on his mouth hygiene and appreciates the Magic mouthwash believes it has helped greatly. 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 ADD (attention deficit disorder)   . Anxiety   . Depression   . Environmental allergies   . Schizo-affective psychosis (HCC)    History reviewed. No pertinent surgical history. Family History:  Family History  Problem Relation Age of Onset  . Hypertension Mother   . Hypertension Father   . Mental illness Neg Hx    Family Psychiatric  History: neg Social History:  Social History   Substance and Sexual Activity  Alcohol Use No  . Alcohol/week: 0.0 standard drinks     Social History   Substance and Sexual Activity  Drug Use Yes  . Types: Other-see comments, Marijuana   Comment: oil smoked in pipe calls "DAB", friends buy for him    Social History   Socioeconomic History  . Marital status: Single    Spouse name: Not on file  . Number of children: Not on file  . Years of education: Not on file  . Highest education level: Not on file  Occupational History  . Not on file  Social Needs  . Financial resource strain: Not on file  . Food insecurity:    Worry: Not on file    Inability: Not on file  . Transportation needs:    Medical: Not on file    Non-medical: Not on file  Tobacco Use   . Smoking status: Current Every Day Smoker    Packs/day: 2.00  . Smokeless tobacco: Never Used  Substance and Sexual Activity  . Alcohol use: No    Alcohol/week: 0.0 standard drinks  . Drug use: Yes    Types: Other-see comments, Marijuana    Comment: oil smoked in pipe calls "DAB", friends buy for him  . Sexual activity: Yes    Birth control/protection: Condom  Lifestyle  . Physical activity:    Days per week: Not on file    Minutes per session: Not on file  . Stress: Not on file  Relationships  . Social connections:    Talks on phone: Not on file    Gets together: Not on file    Attends religious service: Not on file    Active member of club or organization: Not on file    Attends meetings of clubs or organizations: Not on file    Relationship status: Not on file  Other Topics Concern  . Not on file  Social History Narrative  . Not on file   Additional Social History:    Pain Medications: see MAR Prescriptions: see MAR Over the Counter: see MAR History of alcohol / drug use?: Yes(Pt reports he has used alcohol and marijuana in the past. Denies recent use.) Longest period of sobriety (when/how long): unknown Negative Consequences of Use: Financial  Sleep: Good  Appetite:  Good  Current Medications: Current Facility-Administered Medications  Medication Dose Route Frequency Provider Last Rate Last Dose  . acetaminophen (TYLENOL) tablet 650 mg  650 mg Oral Q6H PRN Nira Conn A, NP      . alum & mag hydroxide-simeth (MAALOX/MYLANTA) 200-200-20 MG/5ML suspension 30 mL  30 mL Oral Q4H PRN Nira Conn A, NP      . ARIPiprazole (ABILIFY) tablet 2 mg  2 mg Oral Daily Malvin Johns, MD   2 mg at 10/17/18 0800  . escitalopram (LEXAPRO) tablet 20 mg  20 mg Oral Daily Nira Conn A, NP   20 mg at 10/17/18 0800  . hydrOXYzine (ATARAX/VISTARIL) tablet 25 mg  25 mg Oral TID PRN Jackelyn Poling, NP   25 mg at 10/16/18 2056  . magnesium hydroxide (MILK  OF MAGNESIA) suspension 30 mL  30 mL Oral Daily PRN Nira Conn A, NP      . nicotine (NICODERM CQ - dosed in mg/24 hours) patch 21 mg  21 mg Transdermal Daily Nira Conn A, NP   21 mg at 10/17/18 0800  . ondansetron (ZOFRAN) tablet 8 mg  8 mg Oral Q8H PRN Nira Conn A, NP   8 mg at 10/16/18 0319  . ondansetron (ZOFRAN-ODT) disintegrating tablet 4 mg  4 mg Oral TID Malvin Johns, MD   4 mg at 10/17/18 0800  . traZODone (DESYREL) tablet 50 mg  50 mg Oral QHS PRN Malvin Johns, MD        Lab Results:  Results for orders placed or performed during the hospital encounter of 10/14/18 (from the past 48 hour(s))  CBC     Status: None   Collection Time: 10/15/18  6:34 PM  Result Value Ref Range   WBC 4.7 4.0 - 10.5 K/uL   RBC 5.24 4.22 - 5.81 MIL/uL   Hemoglobin 14.7 13.0 - 17.0 g/dL   HCT 16.1 09.6 - 04.5 %   MCV 85.7 80.0 - 100.0 fL   MCH 28.1 26.0 - 34.0 pg   MCHC 32.7 30.0 - 36.0 g/dL   RDW 40.9 81.1 - 91.4 %   Platelets 360 150 - 400 K/uL   nRBC 0.0 0.0 - 0.2 %    Comment: Performed at [facility], [address], [place], [place] [address]  Comprehensive metabolic panel     Status: Abnormal   Collection Time: 10/15/18  6:34 PM  Result Value Ref Range   Sodium 138 135 - 145 mmol/L   Potassium 3.3 (L) 3.5 - 5.1 mmol/L   Chloride 98 98 - 111 mmol/L   CO2 31 22 - 32 mmol/L   Glucose, Bld 104 (H) 70 - 99 mg/dL   BUN 14 6 - 20 mg/dL   Creatinine, Ser 6.21 (H) 0.61 - 1.24 mg/dL   Calcium 30.8 8.9 - 65.7 mg/dL   Total Protein 7.3 6.5 - 8.1 g/dL   Albumin 4.2 3.5 - 5.0 g/dL   AST 19 15 - 41 U/L   ALT 32 0 - 44 U/L   Alkaline Phosphatase 66 38 - 126 U/L   Total Bilirubin 0.6 0.3 - 1.2 mg/dL   GFR calc non Af Amer >60 >60 mL/min   GFR calc Af Amer >60 >60 mL/min   Anion gap 9 5 - 15    Comment: Performed at [facility], [address], [place], [place] [address]  Hemoglobin A1c     Status: Abnormal   Collection Time:  10/15/18  6:34 PM   Result Value Ref Range   Hgb A1c MFr Bld 5.9 (H) 4.8 - 5.6 %    Comment: (NOTE) Pre diabetes:          5.7%-6.4% Diabetes:              >6.4% Glycemic control for   <7.0% adults with diabetes    Mean Plasma Glucose 122.63 mg/dL    Comment: Performed at [facility], [address] [place], [place] [address]  Lipid panel     Status: Abnormal   Collection Time: 10/15/18  6:34 PM  Result Value Ref Range   Cholesterol 138 0 - 200 mg/dL   Triglycerides 46 <364 mg/dL   HDL 29 (L) >68 mg/dL   Total CHOL/HDL Ratio 4.8 RATIO   VLDL 9 0 - 40 mg/dL   LDL Cholesterol 032 (H) 0 - 99 mg/dL    Comment:        Total Cholesterol/HDL:CHD Risk Coronary Heart Disease Risk Table                     Men   Women  1/2 Average Risk   3.4   3.3  Average Risk       5.0   4.4  2 X Average Risk   9.6   7.1  3 X Average Risk  23.4   11.0        Use the calculated Patient Ratio above and the CHD Risk Table to determine the patient's CHD Risk.        ATP III CLASSIFICATION (LDL):  <100     mg/dL   Optimal  122-482  mg/dL   Near or Above                    Optimal  130-159  mg/dL   Borderline  500-370  mg/dL   High  >488     mg/dL   Very High Performed at [facility], [address] [place], [place] [address]   TSH     Status: None   Collection Time: 10/15/18  6:34 PM  Result Value Ref Range   TSH 0.879 0.350 - 4.500 uIU/mL    Comment: Performed by a 3rd Generation assay with a functional sensitivity of <=0.01 uIU/mL. Performed at [facility], [address], [place], [place] [address]   Prolactin     Status: Abnormal   Collection Time: 10/15/18  6:34 PM  Result Value Ref Range   Prolactin 3.4 (L) 4.0 - 15.2 ng/mL    Comment: (NOTE) Performed At: [facility] [address] [place], [place] [id number] Jolene Schimke MD ZP:[id number]     Blood Alcohol level:  Lab Results  Component Value Date   [facility] <10 10/12/2018   ETH <10  08/17/2017    Metabolic Disorder Labs: Lab Results  Component Value Date   HGBA1C 5.9 (H) 10/15/2018   MPG 122.63 10/15/2018   MPG 102.54 05/10/2017   Lab Results  Component Value Date   PROLACTIN 3.4 (L) 10/15/2018   PROLACTIN 6.5 05/10/2017   Lab Results  Component Value Date   CHOL 138 10/15/2018   TRIG 46 10/15/2018   HDL 29 (L) 10/15/2018   CHOLHDL 4.8 10/15/2018   VLDL 9 10/15/2018   LDLCALC 100 (H) 10/15/2018   LDLCALC 79 05/10/2017    Physical Findings: AIMS: Facial and Oral Movements Muscles of Facial Expression: None, normal Lips  and Perioral Area: None, normal Jaw: None, normal Tongue: None, normal,Extremity Movements Upper (arms, wrists, hands, fingers): None, normal Lower (legs, knees, ankles, toes): None, normal, Trunk Movements Neck, shoulders, hips: None, normal, Overall Severity Severity of abnormal movements (highest score from questions above): None, normal Incapacitation due to abnormal movements: None, normal Patient's awareness of abnormal movements (rate only patient's report): No Awareness, Dental Status Current problems with teeth and/or dentures?: No Does patient usually wear dentures?: No  CIWA:  CIWA-Ar Total: 0 COWS:  COWS Total Score: 2  Musculoskeletal: Strength & Muscle Tone: within normal limits Gait & Station: normal Patient leans: N/A  Psychiatric Specialty Exam: Physical Exam  ROS  Blood pressure (!) 82/36, pulse (!) 159, temperature 99.2 F (37.3 C), temperature source Oral, resp. rate 18, height 6\' 1"  (1.854 m), weight 99.8 kg, SpO2 100 %.Body mass index is 29.03 kg/m.  General Appearance: Casual  Eye Contact:  Fair  Speech:  Garbled  Volume:  Decreased  Mood:  Anxious and Dysphoric  Affect:  Blunt  Thought Process:  Descriptions of Associations: Circumstantial  Orientation:  Full (Time, Place, and Person)  Thought Content:  Obsessions  Suicidal Thoughts:  No  Homicidal Thoughts:  No  Memory:  Immediate;   Fair   Judgement:  Fair  Insight:  Fair  Psychomotor Activity:  Normal  Concentration:  Concentration: Fair  Recall:  [employer] of Knowledge:  Fair  Language:  Fair  Akathisia:  Negative  Handed:  Right  AIMS (if indicated):     Assets:  Resilience Social Support  ADL's:  Intact  Cognition:  WNL  Sleep:  Number of Hours: 5.25     Treatment Plan Summary: Daily contact with patient to assess and evaluate symptoms and progress in treatment, Medication management and Plan Continue aripiprazole but given a long-acting form continue cognitive-based therapy reality-based therapy probable discharge 1 to 2 days  Malvin Johns, MD 10/17/2018, 12:06 PM

## 2018-10-17 NOTE — Progress Notes (Signed)
Adult Psychoeducational Group Note  Date:  10/17/2018 Time:  9:03 PM  Group Topic/Focus:  Wrap-Up Group:   The focus of this group is to help patients review their daily goal of treatment and discuss progress on daily workbooks.  Participation Level:  Active  Participation Quality:  Appropriate  Affect:  Appropriate  Cognitive:  Appropriate  Insight: Appropriate  Engagement in Group:  Engaged  Modes of Intervention:  Discussion  Additional Comments: The patient expressed that he rates today a 6.  Octavio Manns 10/17/2018, 9:03 PM

## 2018-10-17 NOTE — Progress Notes (Signed)
Recreation Therapy Notes  Date: 3.12.20 Time: 1000 Location: 500 Hall Dayroom  Group Topic: Self-Esteem  Goal Area(s) Addresses:  Patient will successfully identify positive attributes about themselves.  Patient will successfully identify benefit of improved self-esteem.   Intervention: Worksheet, pencils  Activity: My Strengths and Qualities.  Patients were to identify three things they art good at, compliments they have received, appearance, challenges they overcame, how they helped others, what they value, times they made others happy and what makes them unique.  Education: Self-Esteem, Discharge Planning.   Education Outcome: Acknowledges education/In group clarification offered/Needs additional education  Clinical Observations/Feedback: Pt did not attend group.    Mahagony Grieb, LRT/CTRS         Marvin Crawford 10/17/2018 11:07 AM 

## 2018-10-17 NOTE — Progress Notes (Signed)
Godson notified staff that he was feeling dizzy.  VS obtained, 101.6 temp, 89/67 BP sitting and 114 pulse.  He reported having head ache but no other flu symptoms.  He continues to report difficulty thinking his saliva is "contaminated."  He reported not drinking or eating much today and he continues to spit in the trash cans.  Notified Donell Sievert PA.  Order to send to [facility] for evaluation.

## 2018-10-17 NOTE — Progress Notes (Signed)
Pt left the unit with EMS.

## 2018-10-17 NOTE — Progress Notes (Signed)
Called Mother Marylene Land, emergency contact, to inform her that Ryel has been transferred to [facility] for evaluation.

## 2018-10-17 NOTE — ED Notes (Signed)
Bed: WA07 Expected date:  Expected time:  Means of arrival:  Comments: From BH/fever

## 2018-10-17 NOTE — ED Provider Notes (Addendum)
TIME SEEN: 11:59 PM  CHIEF COMPLAINT: Fever, dizziness, vomiting  HPI: Patient is a 23 year old male with history of depression, schizoaffective disorder who presents to the emergency department from behavioral health hospital with fevers, vomiting, dizziness.  Found to be tachycardic and hypotensive today.  Has been febrile.  Reports vomiting for several days.  No diarrhea.  Has diffuse abdominal pain.  Also states he has been short of breath for a month but no cough.  No known sick contacts.  No previous abdominal surgery.  No dysuria or hematuria.  ROS: See HPI Constitutional:  fever  Eyes: no drainage  ENT: no runny nose   Cardiovascular:  no chest pain  Resp: no SOB  GI:  vomiting GU: no dysuria Integumentary: no rash  Allergy: no hives  Musculoskeletal: no leg swelling  Neurological: no slurred speech ROS otherwise negative  PAST MEDICAL HISTORY/PAST SURGICAL HISTORY:  Past Medical History:  Diagnosis Date  . ADD (attention deficit disorder)   . Anxiety   . Depression   . Environmental allergies   . Schizo-affective psychosis (HCC)     MEDICATIONS:  Prior to Admission medications   Medication Sig Start Date End Date Taking? Authorizing Provider  ARIPiprazole (ABILIFY) 15 MG tablet Take 1 tablet (15 mg total) by mouth daily. For mood control 10/13/18  Yes [place] Franson, Chase Picket, PA-C  escitalopram (LEXAPRO) 20 MG tablet Take 1 tablet (20 mg total) by mouth daily. 10/13/18  Yes Zong Mcquarrie, Chase Picket, PA-C  fluticasone (FLONASE) 50 MCG/ACT nasal spray Place 1 spray into both nostrils daily. Patient not taking: Reported on 10/15/2018 05/17/18   Henderly, Britni A, PA-C  hydrOXYzine (ATARAX/VISTARIL) 25 MG tablet Take 1 tablet (25 mg total) by mouth every 6 (six) hours as needed for anxiety. Patient not taking: Reported on 10/15/2018 10/13/18   Monzerrath Mcburney, Chase Picket, PA-C  traZODone (DESYREL) 100 MG tablet Take 1 tablet (100 mg) by mouth at bedtime: For sleep Patient not taking: Reported on  10/15/2018 10/13/18   [place] Basham, Chase Picket, PA-C    ALLERGIES:  No Known Allergies  SOCIAL HISTORY:  Social History   Tobacco Use  . Smoking status: Current Every Day Smoker    Packs/day: 2.00  . Smokeless tobacco: Never Used  Substance Use Topics  . Alcohol use: No    Alcohol/week: 0.0 standard drinks    FAMILY HISTORY: Family History  Problem Relation Age of Onset  . Hypertension Mother   . Hypertension Father   . Mental illness Neg Hx     EXAM: BP 101/65 (BP Location: Left Arm)   Pulse (!) 101   Temp 99.2 F (37.3 C) (Oral)   Resp 18   Ht 6\' 1"  (1.854 m)   Wt 99.8 kg   SpO2 100%   BMI 29.03 kg/m  CONSTITUTIONAL: Alert and oriented and responds appropriately to questions.  Nontoxic-appearing HEAD: Normocephalic EYES: Conjunctivae clear, pupils appear equal, EOMI ENT: normal nose; dry mucous membranes NECK: Supple, no meningismus, no nuchal rigidity, no LAD  CARD: Regular and tachycardic; S1 and S2 appreciated; no murmurs, no clicks, no rubs, no gallops RESP: Normal chest excursion without splinting or tachypnea; breath sounds clear and equal bilaterally; no wheezes, no rhonchi, no rales, no hypoxia or respiratory distress, speaking full sentences ABD/GI: Normal bowel sounds; non-distended; soft, Vesely tender throughout the abdomen, no rebound, no guarding, no peritoneal signs, no hepatosplenomegaly BACK:  The back appears normal and is non-tender to palpation, there is no CVA tenderness EXT: Normal ROM in all joints;  non-tender to palpation; no edema; normal capillary refill; no cyanosis, no calf tenderness or swelling    SKIN: Normal color for age and race; warm; no rash NEURO: Moves all extremities equally PSYCH: The patient's mood and manner are appropriate. Grooming and personal hygiene are appropriate.  MEDICAL DECISION MAKING: Patient here with fever, tachycardia, hypotension.  Has already received IV fluids and vital signs are improving.  Initiated code  sepsis.  Differential includes viral gastroenteritis, appendicitis, cholecystitis, colitis, pneumonia, influenza.  Will obtain labs, urine, cultures.  Will give IV fluids, broad-spectrum antibiotics.  Anticipate admission.  ED PROGRESS: Patient reports feeling better.  His work-up here is unremarkable.  No leukocytosis.  Normal LFTs, lipase.  CT of the abdomen pelvis shows no acute abnormality.  Suspect this may be a viral illness and that his hypotension was due to dehydration rather than sepsis.  Will admit for observation continued hydration and monitoring.  I am concerned about sending him back to behavioral health immediately given he was extremely hypotensive earlier.  I think he would benefit from close monitoring from the medical team for another day and following up on his blood cultures.  3:57 AM Discussed patient's case with hospitalist, Dr. Loney Loh.  I have recommended admission and patient (and family if present) agree with this plan. Admitting physician will place admission orders.   I reviewed all nursing notes, vitals, pertinent previous records, EKGs, lab and urine results, imaging (as available).   4:05 AM  Pt still vomiting.  Will give second dose of Zofran.  Mother at bedside now reports that he has been vomiting for 3 weeks.  Hospitalist at bedside as well for admission.     EKG Interpretation  Date/Time:  Friday October 18 2018 01:33:45 EDT Ventricular Rate:  82 PR Interval:    QRS Duration: 117 QT Interval:  360 QTC Calculation: 421 R Axis:   7 Text Interpretation:  Sinus rhythm Nonspecific intraventricular conduction delay Rate improved compared to prior Confirmed by Reilley Latorre, Baxter Hire [id number]) on 10/18/2018 1:39:45 AM        CRITICAL CARE Performed by: Baxter Hire Talan Gildner   Total critical care time: 65 minutes  Critical care time was exclusive of separately billable procedures and treating other patients.  Critical care was necessary to treat or prevent imminent or  life-threatening deterioration.  Critical care was time spent personally by me on the following activities: development of treatment plan with patient and/or surrogate as well as nursing, discussions with consultants, evaluation of patient's response to treatment, examination of patient, obtaining history from patient or surrogate, ordering and performing treatments and interventions, ordering and review of laboratory studies, ordering and review of radiographic studies, pulse oximetry and re-evaluation of patient's condition.    Aysia Lowder, Layla Maw, DO 10/18/18 0357    Varina Hulon, Layla Maw, DO 10/18/18 [phone]

## 2018-10-17 NOTE — ED Triage Notes (Signed)
Via EMS, pt. From [facility] with complaint of fever and hypotension. Pt. Reported to [facility] staff that he felt dizzy after brushing his teeth and thought that he must have swallowed the toothpaste which  made him dizzy. Pt. BP 90/60 and reported of having temp. Of 100.3'F. sitter at bedside.

## 2018-10-17 NOTE — Progress Notes (Signed)
Called non emergent EMS for transport to [facility] for evaluation of elevated temperature, hypotension and elevated pulse.  Called Victorino Dike, [facility] Charge Nurse, to inform her of the transfer.

## 2018-10-17 NOTE — [facility] Group Notes (Signed)
[facility] Mental [employer] Association Group Therapy 10/17/2018 1:15pm  Type of Therapy: Mental Health Association Presentation  Participation Level: Did not attend  Participation Quality:   Affect:   Cognitive:   Insight:   Engagement in Therapy:   Modes of Intervention: Discussion, Education and Socialization  Summary of Progress/Problems: Mental Health Association (MHA) Speaker came to talk about his personal journey with mental health. The pt processed ways by which to relate to the speaker. MHA speaker provided handouts and educational information pertaining to groups and services offered by the [facility]. Pt was engaged in speaker's presentation and was receptive to resources provided.    Lorri Frederick, LCSW 10/17/2018 2:59 PM

## 2018-10-17 NOTE — Progress Notes (Signed)
DAR NOTE: Patient presents with anxious affect and depressed mood.  Denies suicidal thoughts, pain, auditory and visual hallucinations.  Reports stomach upset and patient observed spitting into a cup after taking his medications.  Rates depression at 0, hopelessness at 0, and anxiety at 5.  Maintained on routine safety checks.  Needed a lots of encouragement before taking his medications.  Support and encouragement offered as needed.  Attended group and participated.  States goal for today is "get out of [facility]."  Patient remained in his room for most of this shift. Patient is safe on the unit.

## 2018-10-18 ENCOUNTER — Inpatient Hospital Stay (HOSPITAL_COMMUNITY)
Admission: AD | Admit: 2018-10-18 | Discharge: 2018-10-20 | DRG: 392 | Disposition: A | Discharge status: Home / Self Care | Payer: Medicaid Other | Source: Intra-hospital | Attending: Internal Medicine | Admitting: Internal Medicine

## 2018-10-18 ENCOUNTER — Inpatient Hospital Stay (HOSPITAL_COMMUNITY): Payer: 59

## 2018-10-18 ENCOUNTER — Encounter (HOSPITAL_COMMUNITY): Payer: Self-pay

## 2018-10-18 ENCOUNTER — Other Ambulatory Visit: Payer: Self-pay

## 2018-10-18 DIAGNOSIS — A419 Sepsis, unspecified organism: Secondary | ICD-10-CM | POA: Diagnosis present

## 2018-10-18 DIAGNOSIS — A084 Viral intestinal infection, unspecified: Principal | ICD-10-CM | POA: Diagnosis present

## 2018-10-18 DIAGNOSIS — I959 Hypotension, unspecified: Secondary | ICD-10-CM | POA: Diagnosis present

## 2018-10-18 DIAGNOSIS — F17210 Nicotine dependence, cigarettes, uncomplicated: Secondary | ICD-10-CM | POA: Diagnosis present

## 2018-10-18 DIAGNOSIS — Z79899 Other long term (current) drug therapy: Secondary | ICD-10-CM

## 2018-10-18 DIAGNOSIS — F419 Anxiety disorder, unspecified: Secondary | ICD-10-CM | POA: Diagnosis present

## 2018-10-18 DIAGNOSIS — F259 Schizoaffective disorder, unspecified: Secondary | ICD-10-CM

## 2018-10-18 DIAGNOSIS — K5909 Other constipation: Secondary | ICD-10-CM | POA: Diagnosis present

## 2018-10-18 DIAGNOSIS — E876 Hypokalemia: Secondary | ICD-10-CM | POA: Diagnosis present

## 2018-10-18 DIAGNOSIS — F909 Attention-deficit hyperactivity disorder, unspecified type: Secondary | ICD-10-CM | POA: Diagnosis present

## 2018-10-18 DIAGNOSIS — R112 Nausea with vomiting, unspecified: Secondary | ICD-10-CM

## 2018-10-18 DIAGNOSIS — F333 Major depressive disorder, recurrent, severe with psychotic symptoms: Secondary | ICD-10-CM | POA: Diagnosis present

## 2018-10-18 DIAGNOSIS — Z8249 Family history of ischemic heart disease and other diseases of the circulatory system: Secondary | ICD-10-CM

## 2018-10-18 DIAGNOSIS — Z9114 Patient's other noncompliance with medication regimen: Secondary | ICD-10-CM

## 2018-10-18 LAB — URINALYSIS, ROUTINE W REFLEX MICROSCOPIC
Bilirubin Urine: NEGATIVE
Glucose, UA: NEGATIVE mg/dL
Hgb urine dipstick: NEGATIVE
Ketones, ur: 5 mg/dL — AB
LEUKOCYTE UA: NEGATIVE
Nitrite: NEGATIVE
Protein, ur: NEGATIVE mg/dL
Specific Gravity, Urine: >1.046 — ABNORMAL HIGH (ref 1.005–1.030)
pH: 6 (ref 5.0–8.0)

## 2018-10-18 LAB — COMPREHENSIVE METABOLIC PANEL
ALK PHOS: 66 U/L (ref 38–126)
ALT: 34 U/L (ref 0–44)
ANION GAP: 10 (ref 5–15)
AST: 21 U/L (ref 15–41)
Albumin: 4 g/dL (ref 3.5–5.0)
BILIRUBIN TOTAL: 1 mg/dL (ref 0.3–1.2)
BUN: 10 mg/dL (ref 6–20)
CALCIUM: 9.3 mg/dL (ref 8.9–10.3)
CO2: 27 mmol/L (ref 22–32)
Chloride: 97 mmol/L — ABNORMAL LOW (ref 98–111)
Creatinine, Ser: 1.14 mg/dL (ref 0.61–1.24)
GFR calc Af Amer: >60 mL/min (ref >60)
GFR calc non Af Amer: >60 mL/min (ref >60)
Glucose, Bld: 124 mg/dL — ABNORMAL HIGH (ref 70–99)
Potassium: 3.1 mmol/L — ABNORMAL LOW (ref 3.5–5.1)
Sodium: 134 mmol/L — ABNORMAL LOW (ref 135–145)
Total Protein: 6.8 g/dL (ref 6.5–8.1)

## 2018-10-18 LAB — INFLUENZA PANEL BY PCR (TYPE A & B)
Influenza A By PCR: NEGATIVE
Influenza B By PCR: NEGATIVE

## 2018-10-18 LAB — CBC WITH DIFFERENTIAL/PLATELET
ABS IMMATURE GRANULOCYTES: 0.01 10*3/uL (ref 0.00–0.07)
Basophils Absolute: 0 10*3/uL (ref 0.0–0.1)
Basophils Relative: 0 %
Eosinophils Absolute: 0 10*3/uL (ref 0.0–0.5)
Eosinophils Relative: 0 %
HCT: 41.6 % (ref 39.0–52.0)
Hemoglobin: 14.2 g/dL (ref 13.0–17.0)
Immature Granulocytes: 0 %
Lymphocytes Relative: 33 %
Lymphs Abs: 1.9 10*3/uL (ref 0.7–4.0)
MCH: 28.4 pg (ref 26.0–34.0)
MCHC: 34.1 g/dL (ref 30.0–36.0)
MCV: 83.2 fL (ref 80.0–100.0)
Monocytes Absolute: 0.5 10*3/uL (ref 0.1–1.0)
Monocytes Relative: 8 %
NEUTROS ABS: 3.5 10*3/uL (ref 1.7–7.7)
NEUTROS PCT: 59 %
Platelets: 300 10*3/uL (ref 150–400)
RBC: 5 MIL/uL (ref 4.22–5.81)
RDW: 11.6 % (ref 11.5–15.5)
WBC: 5.9 10*3/uL (ref 4.0–10.5)
nRBC: 0 % (ref 0.0–0.2)

## 2018-10-18 LAB — RAPID URINE DRUG SCREEN, HOSP PERFORMED
Amphetamines: NOT DETECTED
Barbiturates: NOT DETECTED
Benzodiazepines: NOT DETECTED
Cocaine: NOT DETECTED
Opiates: NOT DETECTED
Tetrahydrocannabinol: NOT DETECTED

## 2018-10-18 LAB — LACTIC ACID, PLASMA: LACTIC ACID, VENOUS: 1.1 mmol/L (ref 0.5–1.9)

## 2018-10-18 LAB — MAGNESIUM: Magnesium: 2.1 mg/dL (ref 1.7–2.4)

## 2018-10-18 MED ORDER — SODIUM CHLORIDE 0.9 % IV SOLN: 2.0000 g | Freq: Three times a day (TID) | INTRAVENOUS | Status: DC

## 2018-10-18 MED ORDER — IOPAMIDOL (ISOVUE-300) INJECTION 61%
100.0000 mL | Freq: Once | INTRAVENOUS | Status: AC | PRN
  Administered 2018-10-18: 100 mL via INTRAVENOUS

## 2018-10-18 MED ORDER — SODIUM CHLORIDE 0.9 % IV SOLN
25.0000 mg | Freq: Once | INTRAVENOUS | Status: AC
  Administered 2018-10-19: 25 mg via INTRAVENOUS
  Filled 2018-10-18: qty 1

## 2018-10-18 MED ORDER — METRONIDAZOLE IN NACL 5-0.79 MG/ML-% IV SOLN: 500.0000 mg | Freq: Three times a day (TID) | INTRAVENOUS | Status: DC

## 2018-10-18 MED ORDER — ONDANSETRON HCL 4 MG/2ML IJ SOLN: 4.0000 mg | Freq: Four times a day (QID) | INTRAMUSCULAR | Status: DC | PRN

## 2018-10-18 MED ORDER — ONDANSETRON HCL 4 MG/2ML IJ SOLN
4.0000 mg | Freq: Four times a day (QID) | INTRAMUSCULAR | Status: DC | PRN
  Administered 2018-10-18 – 2018-10-19 (×4): 4 mg via INTRAVENOUS
  Filled 2018-10-18 (×4): qty 2

## 2018-10-18 MED ORDER — SODIUM CHLORIDE 0.9 % IV BOLUS (SEPSIS)
1000.0000 mL | Freq: Once | INTRAVENOUS | Status: AC
  Administered 2018-10-18: 1000 mL via INTRAVENOUS

## 2018-10-18 MED ORDER — PANTOPRAZOLE SODIUM 40 MG PO TBEC
40.0000 mg | DELAYED_RELEASE_TABLET | Freq: Every day | ORAL | Status: DC
  Administered 2018-10-18 – 2018-10-20 (×2): 40 mg via ORAL
  Filled 2018-10-18 (×3): qty 1

## 2018-10-18 MED ORDER — IOPAMIDOL (ISOVUE-300) INJECTION 61%
INTRAVENOUS | Status: AC
  Filled 2018-10-18: qty 100

## 2018-10-18 MED ORDER — SODIUM CHLORIDE (PF) 0.9 % IJ SOLN
INTRAMUSCULAR | Status: AC
  Filled 2018-10-18: qty 50

## 2018-10-18 MED ORDER — SODIUM CHLORIDE 0.9 % IV SOLN
INTRAVENOUS | Status: DC
  Administered 2018-10-18 – 2018-10-20 (×5): via INTRAVENOUS

## 2018-10-18 MED ORDER — SODIUM CHLORIDE 0.9 % IV SOLN: INTRAVENOUS | Status: DC

## 2018-10-18 MED ORDER — VANCOMYCIN HCL 10 G IV SOLR: 1750.0000 mg | Freq: Two times a day (BID) | INTRAVENOUS | Status: DC

## 2018-10-18 MED ORDER — SODIUM CHLORIDE 0.9 % IV SOLN
INTRAVENOUS | Status: DC
  Administered 2018-10-18 (×2): via INTRAVENOUS

## 2018-10-18 MED ORDER — ARIPIPRAZOLE 5 MG PO TABS
15.0000 mg | ORAL_TABLET | Freq: Every day | ORAL | Status: DC
  Filled 2018-10-18 (×2): qty 1

## 2018-10-18 MED ORDER — ACETAMINOPHEN 325 MG PO TABS: 650.0000 mg | ORAL_TABLET | Freq: Four times a day (QID) | ORAL | Status: DC | PRN

## 2018-10-18 MED ORDER — SODIUM CHLORIDE 0.9 % IV SOLN
2.0000 g | Freq: Three times a day (TID) | INTRAVENOUS | Status: DC
  Administered 2018-10-18 – 2018-10-19 (×4): 2 g via INTRAVENOUS
  Filled 2018-10-18 (×5): qty 2

## 2018-10-18 MED ORDER — ENOXAPARIN SODIUM 40 MG/0.4ML ~~LOC~~ SOLN
40.0000 mg | SUBCUTANEOUS | Status: DC
  Administered 2018-10-18 – 2018-10-20 (×3): 40 mg via SUBCUTANEOUS
  Filled 2018-10-18 (×3): qty 0.4

## 2018-10-18 MED ORDER — METRONIDAZOLE IN NACL 5-0.79 MG/ML-% IV SOLN
500.0000 mg | Freq: Once | INTRAVENOUS | Status: AC
  Administered 2018-10-18: 500 mg via INTRAVENOUS
  Filled 2018-10-18 (×2): qty 100

## 2018-10-18 MED ORDER — BISACODYL 10 MG RE SUPP
10.0000 mg | Freq: Every day | RECTAL | Status: DC
  Administered 2018-10-18 – 2018-10-20 (×3): 10 mg via RECTAL
  Filled 2018-10-18 (×3): qty 1

## 2018-10-18 MED ORDER — VANCOMYCIN HCL 10 G IV SOLR
1750.0000 mg | Freq: Two times a day (BID) | INTRAVENOUS | Status: DC
  Administered 2018-10-18 – 2018-10-19 (×2): 1750 mg via INTRAVENOUS
  Filled 2018-10-18 (×4): qty 1750

## 2018-10-18 MED ORDER — VANCOMYCIN HCL 10 G IV SOLR
2000.0000 mg | Freq: Once | INTRAVENOUS | Status: AC
  Administered 2018-10-18: 2000 mg via INTRAVENOUS
  Filled 2018-10-18: qty 2000

## 2018-10-18 MED ORDER — SODIUM CHLORIDE 0.9 % IV SOLN
2.0000 g | Freq: Once | INTRAVENOUS | Status: AC
  Administered 2018-10-18: 2 g via INTRAVENOUS
  Filled 2018-10-18: qty 2

## 2018-10-18 MED ORDER — VANCOMYCIN HCL IN DEXTROSE 1-5 GM/200ML-% IV SOLN: 1000.0000 mg | Freq: Once | INTRAVENOUS | Status: DC

## 2018-10-18 MED ORDER — ESCITALOPRAM OXALATE 20 MG PO TABS
20.0000 mg | ORAL_TABLET | Freq: Every day | ORAL | Status: DC
  Filled 2018-10-18: qty 2
  Filled 2018-10-18: qty 1

## 2018-10-18 MED ORDER — METOCLOPRAMIDE HCL 5 MG/ML IJ SOLN
10.0000 mg | Freq: Four times a day (QID) | INTRAMUSCULAR | Status: DC | PRN
  Administered 2018-10-18 – 2018-10-19 (×3): 10 mg via INTRAVENOUS
  Filled 2018-10-18 (×4): qty 2

## 2018-10-18 MED ORDER — ENOXAPARIN SODIUM 40 MG/0.4ML ~~LOC~~ SOLN: 40.0000 mg | SUBCUTANEOUS | Status: DC

## 2018-10-18 MED ORDER — POTASSIUM CHLORIDE CRYS ER 20 MEQ PO TBCR
40.0000 meq | EXTENDED_RELEASE_TABLET | Freq: Once | ORAL | Status: AC
  Administered 2018-10-18: 40 meq via ORAL
  Filled 2018-10-18: qty 2

## 2018-10-18 MED ORDER — METRONIDAZOLE IN NACL 5-0.79 MG/ML-% IV SOLN
500.0000 mg | Freq: Three times a day (TID) | INTRAVENOUS | Status: DC
  Administered 2018-10-18 (×3): 500 mg via INTRAVENOUS
  Filled 2018-10-18 (×3): qty 100

## 2018-10-18 MED ORDER — ONDANSETRON HCL 4 MG/2ML IJ SOLN
4.0000 mg | Freq: Once | INTRAMUSCULAR | Status: AC
  Administered 2018-10-18: 4 mg via INTRAVENOUS
  Filled 2018-10-18: qty 2

## 2018-10-18 MED ORDER — TRAZODONE HCL 100 MG PO TABS
100.0000 mg | ORAL_TABLET | Freq: Every day | ORAL | Status: DC
  Administered 2018-10-19: 100 mg via ORAL
  Filled 2018-10-18 (×2): qty 1

## 2018-10-18 MED ORDER — ALUM & MAG HYDROXIDE-SIMETH 200-200-20 MG/5ML PO SUSP
30.0000 mL | Freq: Four times a day (QID) | ORAL | Status: DC | PRN
  Administered 2018-10-18: 30 mL via ORAL
  Filled 2018-10-18: qty 30

## 2018-10-18 MED ORDER — ACETAMINOPHEN 650 MG RE SUPP: 650.0000 mg | Freq: Four times a day (QID) | RECTAL | Status: DC | PRN

## 2018-10-18 NOTE — ED Notes (Signed)
Bed: LO75 Expected date:  Expected time:  Means of arrival:  Comments: Pt from 1-8

## 2018-10-18 NOTE — Progress Notes (Signed)
Same day note  Patient seen and examined at bedside.  Patient still complains of intractable nausea and was unable to tolerate diet.  He tried breakfast but was throwing up after that.  He however denies any abdominal pain.  Denies shortness of breath cough but was noted to have a fever of one 1.63 Fahrenheit in the ED and was tachycardic and hypertensive.  Patient received 3 L of IV fluid.  Lactate was normal.  Flu was negative.  Chest x-ray was negative for pneumonia.  CT abdomen was negative for acute findings.  Patient was started on vancomycin cefepime and metronidazole in the ED.  On examination patient is anxious.  No tenderness noted.  We will continue IV fluids antiemetics supportive care.  Laboratory and radiology data reviewed.  Potassium was 3.1.  We will continue replacement.  No charge.

## 2018-10-18 NOTE — Progress Notes (Signed)
Pharmacy Antibiotic Note  Marvin Crawford is a 23 y.o. male admitted on 10/14/2018 with sepsis.  Pharmacy has been consulted for Vancomycin, cefepime dosing.  Plan:  Vancomycin 2gm iv x1, then Vancomycin 1750 mg IV Q 12 hrs. Goal AUC 400-550. Expected AUC: 488 SCr used: 1.14  Cefepime 2gm iv x1, then 2gm iv q8hr   Height: 6\' 1"  (185.4 cm) Weight: 220 lb (99.8 kg) IBW/kg (Calculated) : 79.9  Temp (24hrs), Avg:100.4 F (38 C), Min:99.2 F (37.3 C), Max:101.6 F (38.7 C)  Recent Labs  Lab 10/11/18 2015 10/12/18 1801 10/15/18 1834 10/18/18 0012  WBC 6.8  --  4.7 5.9  CREATININE 1.34* 1.13 1.27* 1.14  LATICACIDVEN  --   --   --  1.1    Estimated Creatinine Clearance: 126.4 mL/min (by C-G formula based on SCr of 1.14 mg/dL).    No Known Allergies  Antimicrobials this admission: Vancomycin 10/18/2018 >> Cefepime 10/18/2018 >>   Dose adjustments this admission: -  Microbiology results: -  Thank you for allowing pharmacy to be a part of this patient's care.  Aleene Davidson Crowford 10/18/2018 4:25 AM

## 2018-10-18 NOTE — ED Notes (Signed)
Patient aware of urine need 

## 2018-10-18 NOTE — ED Notes (Signed)
ED TO INPATIENT HANDOFF REPORT  ED Nurse Name and Phone #: Christeen DouglasMerle, [phone]  S Name/Age/Gender Marvin Crawford 23 y.o. male Room/Bed: WA25/WA25  Code Status   Code Status: Full Code  Home/SNF/Other Home Patient oriented to: self, place, time and situation Is this baseline? Yes      Chief Complaint Sepsis  Triage Note No notes on file   Allergies No Known Allergies  Level of Care/Admitting Diagnosis ED Disposition    ED Disposition Condition Comment   Admit  [facility] [[id number]]  Level of Care: Telemetry [5]  Admit to tele based on following criteria: Other see comments  Comments: monitor hemodynamics  Diagnosis: Sepsis [facility](HCC) [[id number]]) [[id number]]  Admitting Physician: John GiovanniATHORE, VASUNDHRA [[id number]]  Attending Physician: John GiovanniATHORE, VASUNDHRA [[id number]]  PT Class (Do Not Modify): Observation [104]  PT Acc Code (Do Not Modify): Observation [10022]       B Medical/Surgery History Past Medical History:  Diagnosis Date  . ADD (attention deficit disorder)   . Anxiety   . Depression   . Environmental allergies   . Schizo-affective psychosis (HCC)    History reviewed. No pertinent surgical history.   A IV Location/Drains/Wounds Patient Lines/Drains/Airways Status   Active Line/Drains/Airways    Name:   Placement date:   Placement time:   Site:   Days:   Peripheral IV 10/18/18 Left Hand   10/18/18    0037    Hand   less than 1   Peripheral IV 10/18/18 Right Antecubital   10/18/18    -    Antecubital   less than 1          Intake/Output Last 24 hours  Intake/Output Summary (Last 24 hours) at 10/18/2018 1437 Last data filed at 10/18/2018 1204 Gross per 24 hour  Intake 788.47 ml  Output -  Net 788.47 ml    Labs/Imaging Results for orders placed or performed during the hospital encounter of 10/18/18 (from the past 48 hour(s))  Magnesium     Status: None   Collection Time: 10/18/18 12:12 AM  Result Value Ref Range   Magnesium 2.1 1.7 -  2.4 mg/dL    Comment: Performed at [facility], [address] [place], [place] [id number]  Urine rapid drug screen (hosp performed)     Status: None   Collection Time: 10/18/18  4:47 AM  Result Value Ref Range   Opiates NONE DETECTED NONE DETECTED   Cocaine NONE DETECTED NONE DETECTED   Benzodiazepines NONE DETECTED NONE DETECTED   Amphetamines NONE DETECTED NONE DETECTED   Tetrahydrocannabinol NONE DETECTED NONE DETECTED   Barbiturates NONE DETECTED NONE DETECTED    Comment: (NOTE) DRUG SCREEN FOR MEDICAL PURPOSES ONLY.  IF CONFIRMATION IS NEEDED FOR ANY PURPOSE, NOTIFY LAB WITHIN 5 DAYS. LOWEST DETECTABLE LIMITS FOR URINE DRUG SCREEN Drug Class                     Cutoff (ng/mL) Amphetamine and metabolites    1000 Barbiturate and metabolites    200 Benzodiazepine                 200 Tricyclics and metabolites     300 Opiates and metabolites        300 Cocaine and metabolites        300 THC                            50 Performed  at [facility], [address] [place], [place] [address]    Ct Abdomen Pelvis W Contrast  Result Date: 10/18/2018 CLINICAL DATA:  Fever, hypotension, and abdominal pain. EXAM: CT ABDOMEN AND PELVIS WITH CONTRAST TECHNIQUE: Multidetector CT imaging of the abdomen and pelvis was performed using the standard protocol following bolus administration of intravenous contrast. CONTRAST:  ISOVUE-300 IOPAMIDOL (ISOVUE-300) INJECTION 61% COMPARISON:  Ultrasound right upper quadrant 10/12/2018 FINDINGS: Lower chest: Lung bases are clear. Hepatobiliary: No focal liver abnormality is seen. No gallstones, gallbladder wall thickening, or biliary dilatation. Pancreas: Unremarkable. No pancreatic ductal dilatation or surrounding inflammatory changes. Spleen: Normal in size without focal abnormality. Adrenals/Urinary Tract: Adrenal glands are unremarkable. Kidneys are normal, without renal calculi, focal lesion, or  hydronephrosis. Bladder is unremarkable. Stomach/Bowel: Stomach is within normal limits. Appendix appears normal. No evidence of bowel wall thickening, distention, or inflammatory changes. Vascular/Lymphatic: No significant vascular findings are present. No enlarged abdominal or pelvic lymph nodes. Reproductive: Prostate is unremarkable. Other: No abdominal wall hernia or abnormality. No abdominopelvic ascites. Musculoskeletal: No acute or significant osseous findings. IMPRESSION: No acute process demonstrated in the abdomen or pelvis. No evidence of bowel obstruction or inflammation. Appendix is normal. Electronically Signed   By: Burman Nieves M.D.   On: 10/18/2018 02:31   Dg Chest Port 1 View  Result Date: 10/18/2018 CLINICAL DATA:  Fever and hypotension.  Dizziness.  Current smoker. EXAM: PORTABLE CHEST 1 VIEW COMPARISON:  10/11/2018 FINDINGS: The heart size and mediastinal contours are within normal limits. Both lungs are clear. The visualized skeletal structures are unremarkable. IMPRESSION: No active disease. Electronically Signed   By: Burman Nieves M.D.   On: 10/18/2018 00:32    Pending Labs Unresulted Labs (From admission, onward)    Start     Ordered   10/19/18 0500  Basic metabolic panel  Tomorrow morning,   R     10/18/18 0508          Vitals/Pain Today's Vitals   10/18/18 0545 10/18/18 0549 10/18/18 0600 10/18/18 0700  BP:   131/81 128/76  Pulse: 78  74 80  SpO2: 100%  100% 96%  PainSc:  0-No pain      Isolation Precautions No active isolations  Medications Medications  ARIPiprazole (ABILIFY) tablet 15 mg (15 mg Oral Not Given 10/18/18 1041)  escitalopram (LEXAPRO) tablet 20 mg (20 mg Oral Not Given 10/18/18 1040)  traZODone (DESYREL) tablet 100 mg (has no administration in time range)  enoxaparin (LOVENOX) injection 40 mg (40 mg Subcutaneous Given 10/18/18 1042)  acetaminophen (TYLENOL) tablet 650 mg (has no administration in time range)    Or  acetaminophen  (TYLENOL) suppository 650 mg (has no administration in time range)  metroNIDAZOLE (FLAGYL) IVPB 500 mg (0 mg Intravenous Stopped 10/18/18 1204)  ondansetron (ZOFRAN) injection 4 mg (4 mg Intravenous Given 10/18/18 1219)  alum & mag hydroxide-simeth (MAALOX/MYLANTA) 200-200-20 MG/5ML suspension 30 mL (has no administration in time range)  pantoprazole (PROTONIX) EC tablet 40 mg (40 mg Oral Given 10/18/18 1041)  vancomycin (VANCOCIN) 1,750 mg in sodium chloride 0.9 % 500 mL IVPB (1,750 mg Intravenous Transfusing/Transfer 10/18/18 1437)  ceFEPIme (MAXIPIME) 2 g in sodium chloride 0.9 % 100 mL IVPB (0 g Intravenous Stopped 10/18/18 0829)  0.9 %  sodium chloride infusion ( Intravenous Transfusing/Transfer 10/18/18 1437)  metoCLOPramide (REGLAN) injection 10 mg (has no administration in time range)  bisacodyl (DULCOLAX) suppository 10 mg (has no administration in time range)  potassium chloride SA (K-DUR,KLOR-CON)  CR tablet 40 mEq (40 mEq Oral Given 10/18/18 0548)    Mobility walks     Focused Assessments Pt here for SI and presents with possible sepsis.    R Recommendations: See Admitting Provider Note  Report given to:   Additional Notes: n/a

## 2018-10-18 NOTE — Plan of Care (Signed)

## 2018-10-18 NOTE — ED Notes (Signed)
Pt's Mom belongings are in the cabinet

## 2018-10-18 NOTE — ED Notes (Signed)
Patient transported to CT 

## 2018-10-18 NOTE — Progress Notes (Signed)
A consult was received from an ED physician for Vancomycin, cefepime per pharmacy dosing.  The patient's profile has been reviewed for ht/wt/allergies/indication/available labs.   A one time order has been placed for Vancomycin 2gm iv x1, and Cefepime 2gm iv x1.  Further antibiotics/pharmacy consults should be ordered by admitting physician if indicated.                       Thank you, Aleene Davidson Crowford 10/18/2018  12:16 AM

## 2018-10-18 NOTE — H&P (Signed)
History and Physical    Marvin Crawford WUJ:[id number] DOB: November 17, 1995 DOA: 10/18/2018  PCP: Patient, No Pcp Per Patient coming from: Behavioral health  Chief Complaint: Fever, hypotension  HPI: Marvin Crawford is a 23 y.o. male with medical history significant of schizoaffective disorder, ADD, anxiety, depression presenting from behavioral health for evaluation of fever and hypotension.  Patient states he has been vomiting and feeling dizzy.  Mother at bedside states patient has been having bilious, nonbloody emesis for the past 3 weeks.  He has not been eating much.  She has taken the patient to the ED 4 times there has been no explanation for his symptoms.  Patient states his abdomen hurts only when he is pressing on it but not otherwise.  Denies having any diarrhea.  No additional history could be obtained from him.  Review of Systems: As per HPI otherwise 10 point review of systems negative.  Past Medical History:  Diagnosis Date   ADD (attention deficit disorder)    Anxiety    Depression    Environmental allergies    Schizo-affective psychosis (HCC)     No past surgical history on file.   reports that he has been smoking. He has been smoking about 2.00 packs per day. He has never used smokeless tobacco. He reports current drug use. Drugs: Other-see comments and Marijuana. He reports that he does not drink alcohol.  No Known Allergies  Family History  Problem Relation Age of Onset   Hypertension Mother    Hypertension Father    Mental illness Neg Hx     Prior to Admission medications   Medication Sig Start Date End Date Taking? Authorizing Provider  ARIPiprazole (ABILIFY) 15 MG tablet Take 1 tablet (15 mg total) by mouth daily. For mood control 10/13/18  Yes Ward, Chase Picket, PA-C  escitalopram (LEXAPRO) 20 MG tablet Take 1 tablet (20 mg total) by mouth daily. 10/13/18  Yes Ward, Chase Picket, PA-C  fluticasone (FLONASE) 50 MCG/ACT nasal spray Place 1 spray into both  nostrils daily. Patient not taking: Reported on 10/15/2018 05/17/18   Henderly, Britni A, PA-C  hydrOXYzine (ATARAX/VISTARIL) 25 MG tablet Take 1 tablet (25 mg total) by mouth every 6 (six) hours as needed for anxiety. Patient not taking: Reported on 10/15/2018 10/13/18   Ward, Chase Picket, PA-C  traZODone (DESYREL) 100 MG tablet Take 1 tablet (100 mg) by mouth at bedtime: For sleep Patient not taking: Reported on 10/15/2018 10/13/18   Ward, Chase Picket, PA-C    Physical Exam: There were no vitals filed for this visit.  Physical Exam  Constitutional: He is oriented to person, place, and time. He appears well-developed and well-nourished. He appears distressed.  Continuously spitting saliva into a vomit bag  HENT:  Head: Normocephalic.  Mouth/Throat: Oropharynx is clear and moist.  Eyes: Right eye exhibits no discharge. Left eye exhibits no discharge.  Cardiovascular: Normal rate, regular rhythm and intact distal pulses.  Pulmonary/Chest: Effort normal and breath sounds normal. No respiratory distress. He has no wheezes. He has no rales.  Abdominal: Soft. Bowel sounds are normal. He exhibits no distension. There is no abdominal tenderness. There is no rebound and no guarding.  Musculoskeletal:        General: No edema.  Neurological: He is alert and oriented to person, place, and time.  Skin: Skin is warm and dry. He is not diaphoretic.     Labs on Admission: I have personally reviewed following labs and imaging studies  CBC: Recent  Labs  Lab 10/11/18 2015 10/15/18 1834 10/18/18 0012  WBC 6.8 4.7 5.9  NEUTROABS  --   --  3.5  HGB 16.8 14.7 14.2  HCT 49.2 44.9 41.6  MCV 81.6 85.7 83.2  PLT 497* 360 300   Basic Metabolic Panel: Recent Labs  Lab 10/11/18 2015 10/12/18 1801 10/15/18 1834 10/18/18 0012  NA 135 134* 138 134*  K 4.3 4.1 3.3* 3.1*  CL 98 103 98 97*  CO2 24 20* 31 27  GLUCOSE 93 104* 104* 124*  BUN 12 13 14 10   CREATININE 1.34* 1.13 1.27* 1.14  CALCIUM  10.4* 9.5 10.0 9.3   GFR: Estimated Creatinine Clearance: 126.4 mL/min (by C-G formula based on SCr of 1.14 mg/dL). Liver Function Tests: Recent Labs  Lab 10/11/18 2015 10/15/18 1834 10/18/18 0012  AST 20 19 21   ALT 29 32 34  ALKPHOS 76 66 66  BILITOT 1.5* 0.6 1.0  PROT 7.8 7.3 6.8  ALBUMIN 4.3 4.2 4.0   Recent Labs  Lab 10/11/18 2015  LIPASE 66*   No results for input(s): AMMONIA in the last 168 hours. Coagulation Profile: No results for input(s): INR, PROTIME in the last 168 hours. Cardiac Enzymes: No results for input(s): CKTOTAL, CKMB, CKMBINDEX, TROPONINI in the last 168 hours. BNP (last 3 results) No results for input(s): PROBNP in the last 8760 hours. HbA1C: Recent Labs    10/15/18 1834  HGBA1C 5.9*   CBG: Recent Labs  Lab 10/13/18 1232  GLUCAP 105*   Lipid Profile: Recent Labs    10/15/18 1834  CHOL 138  HDL 29*  LDLCALC 100*  TRIG 46  CHOLHDL 4.8   Thyroid Function Tests: Recent Labs    10/15/18 1834  TSH 0.879   Anemia Panel: No results for input(s): VITAMINB12, FOLATE, FERRITIN, TIBC, IRON, RETICCTPCT in the last 72 hours. Urine analysis:    Component Value Date/Time   COLORURINE AMBER (A) 10/12/2018 1245   APPEARANCEUR CLEAR 10/12/2018 1245   LABSPEC 1.033 (H) 10/12/2018 1245   PHURINE 6.0 10/12/2018 1245   GLUCOSEU NEGATIVE 10/12/2018 1245   HGBUR NEGATIVE 10/12/2018 1245   BILIRUBINUR NEGATIVE 10/12/2018 1245   BILIRUBINUR neg 04/21/2015 1010   KETONESUR 80 (A) 10/12/2018 1245   PROTEINUR NEGATIVE 10/12/2018 1245   UROBILINOGEN 1.0 04/21/2015 1010   UROBILINOGEN 1.0 02/17/2013 0727   NITRITE NEGATIVE 10/12/2018 1245   LEUKOCYTESUR NEGATIVE 10/12/2018 1245    Radiological Exams on Admission: Ct Abdomen Pelvis W Contrast  Result Date: 10/18/2018 CLINICAL DATA:  Fever, hypotension, and abdominal pain. EXAM: CT ABDOMEN AND PELVIS WITH CONTRAST TECHNIQUE: Multidetector CT imaging of the abdomen and pelvis was performed using  the standard protocol following bolus administration of intravenous contrast. CONTRAST:  ISOVUE-300 IOPAMIDOL (ISOVUE-300) INJECTION 61% COMPARISON:  Ultrasound right upper quadrant 10/12/2018 FINDINGS: Lower chest: Lung bases are clear. Hepatobiliary: No focal liver abnormality is seen. No gallstones, gallbladder wall thickening, or biliary dilatation. Pancreas: Unremarkable. No pancreatic ductal dilatation or surrounding inflammatory changes. Spleen: Normal in size without focal abnormality. Adrenals/Urinary Tract: Adrenal glands are unremarkable. Kidneys are normal, without renal calculi, focal lesion, or hydronephrosis. Bladder is unremarkable. Stomach/Bowel: Stomach is within normal limits. Appendix appears normal. No evidence of bowel wall thickening, distention, or inflammatory changes. Vascular/Lymphatic: No significant vascular findings are present. No enlarged abdominal or pelvic lymph nodes. Reproductive: Prostate is unremarkable. Other: No abdominal wall hernia or abnormality. No abdominopelvic ascites. Musculoskeletal: No acute or significant osseous findings. IMPRESSION: No acute process demonstrated in the abdomen  or pelvis. No evidence of bowel obstruction or inflammation. Appendix is normal. Electronically Signed   By: Burman Nieves M.D.   On: 10/18/2018 02:31   Dg Chest Port 1 View  Result Date: 10/18/2018 CLINICAL DATA:  Fever and hypotension.  Dizziness.  Current smoker. EXAM: PORTABLE CHEST 1 VIEW COMPARISON:  10/11/2018 FINDINGS: The heart size and mediastinal contours are within normal limits. Both lungs are clear. The visualized skeletal structures are unremarkable. IMPRESSION: No active disease. Electronically Signed   By: Burman Nieves M.D.   On: 10/18/2018 00:32   EKG: Personally reviewed.  Normal sinus rhythm.  Assessment/Plan Principal Problem:   Sepsis (HCC) Active Problems:   Major depressive disorder, recurrent, severe with psychotic features (HCC)    Schizoaffective disorder (HCC)   Intractable nausea and vomiting   Hypokalemia   Sepsis, unclear etiology -Febrile with T-max 101.6 F.  Tachycardic and hypotensive.  Blood pressure as low as 82/36, now improved after receiving 3 L IV fluid boluses. -No leukocytosis.  Lactic acid normal.  Influenza panel negative.  Chest x-ray not suggestive of pneumonia.  CT abdomen negative for acute finding. -Continue broad-spectrum antibiotics at this time (vancomycin, cefepime, metronidazole) -Continue IV fluid hydration -Blood culture x2 -UA -Tylenol PRN  Intractable nausea and vomiting -This is his fourth ED visit since 3/6 for the same complaint. CT abdomen pelvis without explanation for patient's symptoms.  No active emesis, patient noted to be continuously spitting saliva into a vomit bag.  Unclear whether symptoms are related to GERD/dyspepsia.  Symptoms less likely to be related to medication side effect as patient's mother states he was just recently started on medications by behavioral health a few days ago but vomiting has been going on for the past 3 weeks.  He does have a history of cannabis use and urine drug screen was positive for THC a few years ago. -IV fluid hydration -IV Zofran PRN -GI cocktail PRN -Protonix 40 mg daily -Check UDS  Hypokalemia -Potassium 3.1 in the setting of GI loss from emesis. -Replete potassium.  Check magnesium level.  Continue to monitor BMP.  Schizoaffective disorder, depression, anxiety -Continue Abilify, Lexapro, trazodone  DVT prophylaxis: Lovenox Code Status: Full code Family Communication: Mother at bedside. Disposition Plan: Anticipate discharge after clinical improvement. Consults called: None Admission status: Observation, telemetry   John Giovanni MD [facility] Pager [phone]  If 7PM-7AM, please contact night-coverage [email or web address] Password [facility]  10/18/2018, 5:14 AM

## 2018-10-19 ENCOUNTER — Observation Stay (HOSPITAL_COMMUNITY): Payer: Medicaid Other

## 2018-10-19 DIAGNOSIS — I959 Hypotension, unspecified: Secondary | ICD-10-CM | POA: Diagnosis present

## 2018-10-19 DIAGNOSIS — F251 Schizoaffective disorder, depressive type: Secondary | ICD-10-CM

## 2018-10-19 DIAGNOSIS — Z9114 Patient's other noncompliance with medication regimen: Secondary | ICD-10-CM | POA: Diagnosis not present

## 2018-10-19 DIAGNOSIS — A419 Sepsis, unspecified organism: Secondary | ICD-10-CM | POA: Diagnosis not present

## 2018-10-19 DIAGNOSIS — Z79899 Other long term (current) drug therapy: Secondary | ICD-10-CM | POA: Diagnosis not present

## 2018-10-19 DIAGNOSIS — A084 Viral intestinal infection, unspecified: Secondary | ICD-10-CM | POA: Diagnosis not present

## 2018-10-19 DIAGNOSIS — E876 Hypokalemia: Secondary | ICD-10-CM | POA: Diagnosis not present

## 2018-10-19 DIAGNOSIS — F419 Anxiety disorder, unspecified: Secondary | ICD-10-CM | POA: Diagnosis present

## 2018-10-19 DIAGNOSIS — K5909 Other constipation: Secondary | ICD-10-CM | POA: Diagnosis present

## 2018-10-19 DIAGNOSIS — F909 Attention-deficit hyperactivity disorder, unspecified type: Secondary | ICD-10-CM | POA: Diagnosis present

## 2018-10-19 DIAGNOSIS — Z8249 Family history of ischemic heart disease and other diseases of the circulatory system: Secondary | ICD-10-CM | POA: Diagnosis not present

## 2018-10-19 DIAGNOSIS — R112 Nausea with vomiting, unspecified: Secondary | ICD-10-CM | POA: Diagnosis not present

## 2018-10-19 DIAGNOSIS — F333 Major depressive disorder, recurrent, severe with psychotic symptoms: Secondary | ICD-10-CM | POA: Diagnosis present

## 2018-10-19 DIAGNOSIS — F17210 Nicotine dependence, cigarettes, uncomplicated: Secondary | ICD-10-CM | POA: Diagnosis present

## 2018-10-19 LAB — BASIC METABOLIC PANEL
ANION GAP: 6 (ref 5–15)
BUN: 5 mg/dL — ABNORMAL LOW (ref 6–20)
CO2: 24 mmol/L (ref 22–32)
Calcium: 8.8 mg/dL — ABNORMAL LOW (ref 8.9–10.3)
Chloride: 107 mmol/L (ref 98–111)
Creatinine, Ser: 0.92 mg/dL (ref 0.61–1.24)
GFR calc non Af Amer: >60 mL/min (ref >60)
Glucose, Bld: 99 mg/dL (ref 70–99)
Potassium: 3.6 mmol/L (ref 3.5–5.1)
SODIUM: 137 mmol/L (ref 135–145)

## 2018-10-19 MED ORDER — ARIPIPRAZOLE 2 MG PO TABS
2.0000 mg | ORAL_TABLET | Freq: Every day | ORAL | Status: DC
  Administered 2018-10-20: 2 mg via ORAL
  Filled 2018-10-19: qty 1

## 2018-10-19 MED ORDER — HYDROXYZINE HCL 25 MG PO TABS
25.0000 mg | ORAL_TABLET | Freq: Four times a day (QID) | ORAL | Status: DC | PRN
  Administered 2018-10-20: 25 mg via ORAL
  Filled 2018-10-19 (×2): qty 1

## 2018-10-19 MED ORDER — SODIUM CHLORIDE 0.9 % IV SOLN
25.0000 mg | Freq: Once | INTRAVENOUS | Status: AC
  Filled 2018-10-19: qty 1

## 2018-10-19 MED ORDER — DIPHENHYDRAMINE HCL 25 MG PO CAPS
25.0000 mg | ORAL_CAPSULE | Freq: Once | ORAL | Status: AC
  Administered 2018-10-20: 25 mg via ORAL
  Filled 2018-10-19: qty 1

## 2018-10-19 MED ORDER — SALINE SPRAY 0.65 % NA SOLN
1.0000 | NASAL | Status: DC | PRN
  Administered 2018-10-20: 1 via NASAL
  Filled 2018-10-19: qty 44

## 2018-10-19 MED ORDER — DOCUSATE SODIUM 100 MG PO CAPS
100.0000 mg | ORAL_CAPSULE | Freq: Two times a day (BID) | ORAL | Status: DC
  Administered 2018-10-19 – 2018-10-20 (×2): 100 mg via ORAL
  Filled 2018-10-19 (×2): qty 1

## 2018-10-19 MED ORDER — ESCITALOPRAM OXALATE 10 MG PO TABS
10.0000 mg | ORAL_TABLET | Freq: Every day | ORAL | Status: DC
  Administered 2018-10-20: 10 mg via ORAL
  Filled 2018-10-19: qty 1

## 2018-10-19 NOTE — Consult Note (Signed)
[facility] Face-to-Face Psychiatry Consult   Reason for Consult:  ''persistent nausea?,psychogenic, was in behavior health.'' Referring Physician:  Dr. Rebekah Chesterfield Patient Identification: Marvin Crawford MRN:  [id number] Principal Diagnosis: Sepsis [facility]) Diagnosis:  Principal Problem:   Sepsis (HCC) Active Problems:   Major depressive disorder, recurrent, severe with psychotic features (HCC)   Schizoaffective disorder (HCC)   Intractable nausea and vomiting   Hypokalemia   Total Time spent with patient: 45 minutes  Subjective:   Marvin Crawford is a 23 y.o. male patient admitted to medical floor from behavior health due to fever.''  HPI: 23 year old male with history of Schizoaffective disorder, depression and ADHD who was transferred to medical floor from [facility] behavior health hospital due to fever and hypotension. Patient's mother who is at bedside reports that patient was diagnosed with sore throat and STD few weeks ago for which he received antibiotics. She reports that patient has been feeling nauseous and throwing up since then. She also reports that patient was briefly in behavior health hospital to restart his psychiatric medications, he received Abilify injection and started on Lexapro and Abilify tablet. Today, patient denies psychosis, delusions, depression, anxiety, suicidal and homicidal ideations, intent or plan. Mother is concerned about her son inability to spot spitting.  Past Psychiatric History: as above  Risk to Self:  denies Risk to Others:   denies Prior Inpatient Therapy:  [facility] in the past Prior Outpatient [facility]:  [facility] [place]  Past Medical History:  Past Medical History:  Diagnosis Date  . ADD (attention deficit disorder)   . Anxiety   . Depression   . Environmental allergies   . Schizo-affective psychosis (HCC)    History reviewed. No pertinent surgical history. Family History:  Family History  Problem Relation Age of Onset  . Hypertension Mother    . Hypertension Father   . Mental illness Neg Hx    Family Psychiatric  History:  Social History:  Social History   Substance and Sexual Activity  Alcohol Use No  . Alcohol/week: 0.0 standard drinks     Social History   Substance and Sexual Activity  Drug Use Yes  . Types: Other-see comments, Marijuana   Comment: oil smoked in pipe calls "DAB", friends buy for him    Social History   Socioeconomic History  . Marital status: Single    Spouse name: Not on file  . Number of children: Not on file  . Years of education: Not on file  . Highest education level: Not on file  Occupational History  . Not on file  Social Needs  . Financial resource strain: Not on file  . Food insecurity:    Worry: Not on file    Inability: Not on file  . Transportation needs:    Medical: Not on file    Non-medical: Not on file  Tobacco Use  . Smoking status: Current Every Day Smoker    Packs/day: 2.00  . Smokeless tobacco: Never Used  Substance and Sexual Activity  . Alcohol use: No    Alcohol/week: 0.0 standard drinks  . Drug use: Yes    Types: Other-see comments, Marijuana    Comment: oil smoked in pipe calls "[employer]AB", friends buy for him  . Sexual activity: Yes    Birth control/protection: Condom  Lifestyle  . Physical activity:    Days per week: Not on file    Minutes per session: Not on file  . Stress: Not on file  Relationships  .  Social connections:    Talks on phone: Not on file    Gets together: Not on file    Attends religious service: Not on file    Active member of club or organization: Not on file    Attends meetings of clubs or organizations: Not on file    Relationship status: Not on file  Other Topics Concern  . Not on file  Social History Narrative  . Not on file   Additional Social History:    Allergies:  No Known Allergies  Labs:  Results for orders placed or performed during the hospital encounter of 10/18/18 (from the past 48 hour(s))  Magnesium      Status: None   Collection Time: 10/18/18 12:12 AM  Result Value Ref Range   Magnesium 2.1 1.7 - 2.4 mg/dL    Comment: Performed at [facility], [address] [place], [place] [address]  Urine rapid drug screen (hosp performed)     Status: None   Collection Time: 10/18/18  4:47 AM  Result Value Ref Range   Opiates NONE DETECTED NONE DETECTED   Cocaine NONE DETECTED NONE DETECTED   Benzodiazepines NONE DETECTED NONE DETECTED   Amphetamines NONE DETECTED NONE DETECTED   Tetrahydrocannabinol NONE DETECTED NONE DETECTED   Barbiturates NONE DETECTED NONE DETECTED    Comment: (NOTE) DRUG SCREEN FOR MEDICAL PURPOSES ONLY.  IF CONFIRMATION IS NEEDED FOR ANY PURPOSE, NOTIFY LAB WITHIN 5 DAYS. LOWEST DETECTABLE LIMITS FOR URINE DRUG SCREEN Drug Class                     Cutoff (ng/mL) Amphetamine and metabolites    1000 Barbiturate and metabolites    200 Benzodiazepine                 200 Tricyclics and metabolites     300 Opiates and metabolites        300 Cocaine and metabolites        300 THC                            50 Performed at [facility], [address] [place], [place] [address]   Basic metabolic panel     Status: Abnormal   Collection Time: 10/19/18  3:36 AM  Result Value Ref Range   Sodium 137 135 - 145 mmol/L   Potassium 3.6 3.5 - 5.1 mmol/L   Chloride 107 98 - 111 mmol/L   CO2 24 22 - 32 mmol/L   Glucose, Bld 99 70 - 99 mg/dL   BUN 5 (L) 6 - 20 mg/dL   Creatinine, Ser 9.39 0.61 - 1.24 mg/dL   Calcium 8.8 (L) 8.9 - 10.3 mg/dL   GFR calc non Af Amer >60 >60 mL/min   GFR calc Af Amer >60 >60 mL/min   Anion gap 6 5 - 15    Comment: Performed at [facility], [address] [place], [place] [address]    Current Facility-Administered Medications  Medication Dose Route Frequency Provider Last Rate Last Dose  . 0.9 %  sodium chloride infusion   Intravenous Continuous Pokhrel, Laxman, MD 125 mL/hr at  10/19/18 1005    . acetaminophen (TYLENOL) tablet 650 mg  650 mg Oral Q6H PRN John Giovanni, MD       Or  . acetaminophen (TYLENOL) suppository 650 mg  650 mg Rectal Q6H PRN John Giovanni, MD      .  alum & mag hydroxide-simeth (MAALOX/MYLANTA) 200-200-20 MG/5ML suspension 30 mL  30 mL Oral Q6H PRN John Giovanni, MD   30 mL at 10/18/18 1724  . [START ON 10/20/2018] ARIPiprazole (ABILIFY) tablet 2 mg  2 mg Oral Daily Ganon Demasi, MD      . bisacodyl (DULCOLAX) suppository 10 mg  10 mg Rectal Daily Pokhrel, Laxman, MD   10 mg at 10/18/18 1725  . enoxaparin (LOVENOX) injection 40 mg  40 mg Subcutaneous Q24H John Giovanni, MD   40 mg at 10/19/18 0851  . [START ON 10/20/2018] escitalopram (LEXAPRO) tablet 10 mg  10 mg Oral Daily Marikay Roads, MD      . metoCLOPramide (REGLAN) injection 10 mg  10 mg Intravenous Q6H PRN Pokhrel, Laxman, MD   10 mg at 10/19/18 0847  . ondansetron (ZOFRAN) injection 4 mg  4 mg Intravenous Q6H PRN John Giovanni, MD   4 mg at 10/19/18 1203  . pantoprazole (PROTONIX) EC tablet 40 mg  40 mg Oral Daily John Giovanni, MD   40 mg at 10/18/18 1041  . traZODone (DESYREL) tablet 100 mg  100 mg Oral QHS John Giovanni, MD        Musculoskeletal: Strength & Muscle Tone: within normal limits Gait & Station: normal Patient leans: N/A  Psychiatric Specialty Exam: Physical Exam  Psychiatric: His speech is normal and behavior is normal. Judgment and thought content normal. His affect is blunt. Cognition and memory are normal.    Review of Systems  Constitutional: Negative.   HENT: Negative.   Eyes: Negative.   Respiratory: Negative.   Cardiovascular: Negative.   Gastrointestinal: Negative.   Genitourinary: Negative.   Musculoskeletal: Negative.   Skin: Positive for rash.  Neurological: Negative.   Endo/Heme/Allergies: Negative.   Psychiatric/Behavioral: Negative.     Blood pressure 128/82, pulse 85, temperature 98.7 F (37.1 C),  temperature source Oral, resp. rate 12, height 6\' 1"  (1.854 m), weight 115 kg, SpO2 99 %.Body mass index is 33.45 kg/m.  General Appearance: Casual  Eye Contact:  Good  Speech:  Clear and Coherent  Volume:  Decreased  Mood:  Euthymic  Affect:  Appropriate  Thought Process:  Coherent and Linear  Orientation:  Full (Time, Place, and Person)  Thought Content:  Logical  Suicidal Thoughts:  No  Homicidal Thoughts:  No  Memory:  Immediate;   Good Recent;   Good Remote;   Good  Judgement:  Intact  Insight:  Fair  Psychomotor Activity:  Psychomotor Retardation  Concentration:  Concentration: Fair and Attention Span: Fair  Recall:  [employer] of Knowledge:  Fair  Language:  Good  Akathisia:  No  Handed:  Right  AIMS (if indicated):     Assets:  Communication Skills Desire for Improvement Social Support  ADL's:  Intact  Cognition:  WNL  Sleep:   poor     Treatment Plan Summary: 23 year old male with history of Schizoaffective disorder who was admitted to medical floor due to fever and hypotension. Based on my interview with patient and his mother, I am unable to attribute patient's current symptoms to a psychological problem.   Recommendations: -Explore medical cause for patient's symptoms. -Decreased Abilify to 2 mg tablet  Daily, patient did not take the medication for over a year and does not need to start at 15 mg. -Consider decreasing Lexapro to 10 mg as well.  -Consider GI consult-patient with intractable nausea. -Refer [facility]'s to [facility] for [facility] medication management when medically stable and  discharged.   Disposition: No evidence of imminent risk to self or others at present.   Patient does not meet criteria for psychiatric inpatient admission. Supportive therapy provided about ongoing stressors. Psychiatric service siging out, re-consult as needed  Thedore Mins, MD 10/19/2018 12:35 PM

## 2018-10-19 NOTE — Progress Notes (Signed)
Patient is requesting anti-anxiety medication. PCP on-call was notified

## 2018-10-19 NOTE — Consult Note (Addendum)
UNASSIGNED-CROSS COVER [facility]  Reason for Consult: Nausea and vomiting. Referring Physician: THP  Marvin Crawford is an 23 y.o. male.  HPI: [name] is a 23 year old black male with a history of schizoaffective disorder ADD anxiety and depression brought to the hospital from behavioral health for evaluation of fever and hypotension according to Dr. Tyson Babinski, who called to consult in, the patient has been having intractable nausea and vomiting but on speaking to his sitter, who is in the room with him, the patient's had no nausea or vomiting.  He seems to spit up some phlegm every few minutes.  He denies having abdominal pain fever chills or rigors. He has a history of chronic constipation is gone several days without a bowel movement.  He says he does not drink enough fluids. There is no history of melena hematochezia ulcers jaundice or colitis.  He was recently treated with antibiotics for sore throat.  He had been noncompliant with his psychiatric medication therefore brought was brought to behavioral health to restart these medications.  Past Medical History:  Diagnosis Date  . ADD (attention deficit disorder)   . Anxiety   . Depression   . Environmental allergies   . Schizo-affective psychosis (HCC)    History reviewed. No pertinent surgical history.  Family History  Problem Relation Age of Onset  . Hypertension Mother   . Hypertension Father   . Mental illness Neg Hx    Social History:  reports that he has been smoking. He has been smoking about 2.00 packs per day. He has never used smokeless tobacco. He reports current drug use. Drugs: Other-see comments and Marijuana. He reports that he does not drink alcohol.  Allergies: No Known Allergies  Medications: I have reviewed the patient's current medications.  Results for orders placed or performed during the hospital encounter of 10/18/18 (from the past 48 hour(s))  Magnesium     Status: None   Collection Time: 10/18/18 12:12  AM  Result Value Ref Range   Magnesium 2.1 1.7 - 2.4 mg/dL    Comment: Performed at [facility], [address], [place], [place] [address]  Urine rapid drug screen (hosp performed)     Status: None   Collection Time: 10/18/18  4:47 AM  Result Value Ref Range   Opiates NONE DETECTED NONE DETECTED   Cocaine NONE DETECTED NONE DETECTED   Benzodiazepines NONE DETECTED NONE DETECTED   Amphetamines NONE DETECTED NONE DETECTED   Tetrahydrocannabinol NONE DETECTED NONE DETECTED   Barbiturates NONE DETECTED NONE DETECTED    Comment: (NOTE) DRUG SCREEN FOR MEDICAL PURPOSES ONLY.  IF CONFIRMATION IS NEEDED FOR ANY PURPOSE, NOTIFY LAB WITHIN 5 DAYS. LOWEST DETECTABLE LIMITS FOR URINE DRUG SCREEN Drug Class                     Cutoff (ng/mL) Amphetamine and metabolites    1000 Barbiturate and metabolites    200 Benzodiazepine                 200 Tricyclics and metabolites     300 Opiates and metabolites        300 Cocaine and metabolites        300 THC                            50 Performed at [facility], [address], [place], [place] [address]   Basic metabolic panel  Status: Abnormal   Collection Time: 10/19/18  3:36 AM  Result Value Ref Range   Sodium 137 135 - 145 mmol/L   Potassium 3.6 3.5 - 5.1 mmol/L   Chloride 107 98 - 111 mmol/L   CO2 24 22 - 32 mmol/L   Glucose, Bld 99 70 - 99 mg/dL   BUN 5 (L) 6 - 20 mg/dL   Creatinine, Ser 5.28 0.61 - 1.24 mg/dL   Calcium 8.8 (L) 8.9 - 10.3 mg/dL   GFR calc non Af Amer >60 >60 mL/min   GFR calc Af Amer >60 >60 mL/min   Anion gap 6 5 - 15    Comment: Performed at [facility], [address], [place], [place] [address]   Ct Abdomen Pelvis W Contrast  Result Date: 10/18/2018 CLINICAL DATA:  Fever, hypotension, and abdominal pain. EXAM: CT ABDOMEN AND PELVIS WITH CONTRAST TECHNIQUE: Multidetector CT imaging of the abdomen and pelvis was performed using the standard  protocol following bolus administration of intravenous contrast. CONTRAST:  ISOVUE-300 IOPAMIDOL (ISOVUE-300) INJECTION 61% COMPARISON:  Ultrasound right upper quadrant 10/12/2018 FINDINGS: Lower chest: Lung bases are clear. Hepatobiliary: No focal liver abnormality is seen. No gallstones, gallbladder wall thickening, or biliary dilatation. Pancreas: Unremarkable. No pancreatic ductal dilatation or surrounding inflammatory changes. Spleen: Normal in size without focal abnormality. Adrenals/Urinary Tract: Adrenal glands are unremarkable. Kidneys are normal, without renal calculi, focal lesion, or hydronephrosis. Bladder is unremarkable. Stomach/Bowel: Stomach is within normal limits. Appendix appears normal. No evidence of bowel wall thickening, distention, or inflammatory changes. Vascular/Lymphatic: No significant vascular findings are present. No enlarged abdominal or pelvic lymph nodes. Reproductive: Prostate is unremarkable. Other: No abdominal wall hernia or abnormality. No abdominopelvic ascites. Musculoskeletal: No acute or significant osseous findings. IMPRESSION: No acute process demonstrated in the abdomen or pelvis. No evidence of bowel obstruction or inflammation. Appendix is normal. Electronically Signed   By: Burman Nieves M.D.   On: 10/18/2018 02:31   Dg Chest Port 1 View  Result Date: 10/18/2018 CLINICAL DATA:  Fever and hypotension.  Dizziness.  Current smoker. EXAM: PORTABLE CHEST 1 VIEW COMPARISON:  10/11/2018 FINDINGS: The heart size and mediastinal contours are within normal limits. Both lungs are clear. The visualized skeletal structures are unremarkable. IMPRESSION: No active disease. Electronically Signed   By: Burman Nieves M.D.   On: 10/18/2018 00:32   US Abdomen Limited Ruq  Result Date: 10/19/2018 CLINICAL DATA:  Nausea and vomiting EXAM: ULTRASOUND ABDOMEN LIMITED RIGHT UPPER QUADRANT COMPARISON:  Ultrasound right upper quadrant October 12, 2018; CT abdomen and pelvis  October 18, 2018 FINDINGS: Gallbladder: No gallstones or wall thickening visualized. There is no pericholecystic fluid. No sonographic Murphy sign noted by sonographer. Common bile duct: Diameter: 4 mm. No intrahepatic or extrahepatic biliary duct dilatation. Liver: No focal lesion identified. Within normal limits in parenchymal echogenicity. Portal vein is patent on color Doppler imaging with normal direction of blood flow towards the liver. IMPRESSION: Study within normal limits. Electronically Signed   By: Bretta Bang III M.D.   On: 10/19/2018 13:33   Review of Systems  Constitutional: Positive for fever. Negative for chills, diaphoresis, malaise/fatigue and weight loss.  HENT: Negative.   Eyes: Negative.   Respiratory: Negative.   Cardiovascular: Negative.   Gastrointestinal: Positive for constipation. Negative for abdominal pain, blood in stool, diarrhea, heartburn, melena, nausea and vomiting.  Genitourinary: Negative.   Musculoskeletal: Negative.   Skin: Negative.   Neurological: Negative.   Endo/Heme/Allergies: Negative.   Psychiatric/Behavioral:  Positive for depression. Negative for hallucinations, memory loss, substance abuse and suicidal ideas. The patient is nervous/anxious. The patient does not have insomnia.    Blood pressure 119/76, pulse 76, temperature 98.6 F (37 C), temperature source Oral, resp. rate 18, height 6\' 1"  (1.854 m), weight 115 kg, SpO2 100 %. Physical Exam  Constitutional: He is oriented to person, place, and time. He appears well-developed and well-nourished.  HENT:  Head: Normocephalic and atraumatic.  Eyes: Pupils are equal, round, and reactive to light. Conjunctivae and EOM are normal.  Neck: Normal range of motion. Neck supple.  Cardiovascular: Normal rate and regular rhythm.  Respiratory: Effort normal and breath sounds normal.  GI: Soft. Bowel sounds are normal.  Musculoskeletal: Normal range of motion.  Neurological: He is alert and oriented to  person, place, and time.  Skin: Skin is warm and dry.  Psychiatric: His affect is inappropriate. He is agitated. He expresses inappropriate judgment.   Assessment/Plan: 1) Chronic constipation-stool softeners like Colace 100 mg 2 at bedtime have been advised along with liberal fluid intake and a high-fiber diet. 2) schizoaffective disorder/ADD/anxiety and depression.  I do not see that the patient has any nausea vomiting at this time and therefore feel no further work-up which work-up is needed from a GI standpoint please call me if they have any questions or concerns. I feel the frequent spitting on the patient's part is associated with his psychiatric issues  Shaasia Odle 10/19/2018, 5:30 PM

## 2018-10-19 NOTE — Progress Notes (Addendum)
PROGRESS NOTE  Marvin Crawford UUE:[id number] DOB: [birth year] DOA: 10/18/2018 PCP: Patient, No Pcp Per   LOS: 1 day   Brief narrative: Marvin Crawford is a 23 y.o. male with medical history significant of schizoaffective disorder, ADD, anxiety, depression presented from behavioral health for evaluation of fever and hypotension.  Patient stated that he had been vomiting and feeling dizzy.  Mother at bedside stated that patient had been having bilious, nonbloody emesis for the past 3 weeks.  He has not been eating much. Patient had been taken the patient to the ED 4 times there has been no explanation for his symptoms.   Subjective: Patient states that he is continually feeling nauseated in the spitting all the time.  He required Compazine this morning for nausea.  Denies any abdominal pain.  Has had a bowel movement yesterday.  Denies any shortness of breath cough fever.  Assessment/Plan:  Principal Problem:   Sepsis (HCC) Active Problems:   Major depressive disorder, recurrent, severe with psychotic features (HCC)   Schizoaffective disorder (HCC)   Intractable nausea and vomiting   Hypokalemia  Fever, hypotension with nausea vomiting on presentation.  This could possibly be viral gastritis.  Temperature has improved.  Patient initially received broad-spectrum antibiotic for concern for sepsis.    Lactic acid normal.  Influenza panel negative.  Chest x-ray not suggestive of pneumonia.  CT abdomen negative for acute finding.  Will discontinue IV antibiotics at this time.  Sepsis has been ruled out.  Intractable nausea and vomiting: CT scan unremarkable.  He has had a recurrent ED visits with the same problem.  He continues to spit in the back.  Could be related to viral gastritis/GERD dyspepsia versus psychogenic in nature.  We will continue to hydrate the patient antiemetics and Compazine today.  Will consult psychiatry for further evaluation. Consider GI evaluation as well.  Continue on  Protonix and GI cocktail.  UDS was negative.  Spoke with Dr. [facility] for consultation.  She recommended ultrasound as well.  Hypokalemia. Improved with replacement.  Continue to monitor electrolytes.  Schizoaffective disorder, depression, anxiety -Continue Abilify, Lexapro, trazodone.  Will consult psychiatry.  VTE Prophylaxis: Lovenox  Code Status: Full code  Family Communication: No one available at bedside  Disposition Plan: Likely in 1 to 2 days.  Will consult psychiatry, GI.  Patient is continually symptomatic and has not improved and is requiring IV antiemetics and IV fluids.  He will be assessed by psychiatry and GI.  At this time we will change the patient's status to inpatient.   Consultants:  Psychiatry consulted, GI consulted  Procedures:  None  Antibiotics: Anti-infectives (From admission, onward)   Start     Dose/Rate Route Frequency Ordered Stop   10/18/18 1200  vancomycin (VANCOCIN) 1,750 mg in sodium chloride 0.9 % 500 mL IVPB  Status:  Discontinued     1,750 mg 250 mL/hr over 120 Minutes Intravenous Every 12 hours 10/18/18 0536 10/19/18 0724   10/18/18 0800  metroNIDAZOLE (FLAGYL) IVPB 500 mg  Status:  Discontinued     500 mg 100 mL/hr over 60 Minutes Intravenous Every 8 hours 10/18/18 0508 10/19/18 0724   10/18/18 0800  ceFEPIme (MAXIPIME) 2 g in sodium chloride 0.9 % 100 mL IVPB  Status:  Discontinued     2 g 200 mL/hr over 30 Minutes Intravenous Every 8 hours 10/18/18 0536 10/19/18 0724      Objective: Vitals:   10/18/18 2129 10/19/18 0536  BP: (!) 144/78 128/82  Pulse: 67 85  Resp: 12   Temp: 99 F (37.2 C) 98.7 F (37.1 C)  SpO2: 100% 99%    Intake/Output Summary (Last 24 hours) at 10/19/2018 1142 Last data filed at 10/19/2018 0600 Gross per 24 hour  Intake 3673.23 ml  Output 1 ml  Net 3672.23 ml   Filed Weights   10/18/18 1751  Weight: 115 kg   Body mass index is 33.45 kg/m.   Physical Exam: GENERAL: Patient is alert awake and  oriented. Not in obvious distress.  Obese, anxious constantly spitting HENT: No scleral pallor or icterus. Pupils equally reactive to light. Oral mucosa is moist NECK: is supple, no palpable thyroid enlargement. CHEST: Clear to auscultation. No crackles or wheezes. Non tender on palpation. Diminished breath sounds bilaterally. CVS: S1 and S2 heard, no murmur. Regular rate and rhythm. No pericardial rub. ABDOMEN: Soft, non-tender, bowel sounds are present. No palpable hepato-splenomegaly. EXTREMITIES: No edema. CNS: Cranial nerves are intact. No focal motor or sensory deficits. SKIN: warm and dry without rashes.  Data Review: I have personally reviewed the following laboratory data and studies,  CBC: Recent Labs  Lab 10/15/18 1834 10/18/18 0012  WBC 4.7 5.9  NEUTROABS  --  3.5  HGB 14.7 14.2  HCT 44.9 41.6  MCV 85.7 83.2  PLT 360 300   Basic Metabolic Panel: Recent Labs  Lab 10/12/18 1801 10/15/18 1834 10/18/18 0012 10/19/18 0336  NA 134* 138 134* 137  K 4.1 3.3* 3.1* 3.6  CL 103 98 97* 107  CO2 20* 31 27 24   GLUCOSE 104* 104* 124* 99  BUN 13 14 10  5*  CREATININE 1.13 1.27* 1.14 0.92  CALCIUM 9.5 10.0 9.3 8.8*  MG  --   --  2.1  --    Liver Function Tests: Recent Labs  Lab 10/15/18 1834 10/18/18 0012  AST 19 21  ALT 32 34  ALKPHOS 66 66  BILITOT 0.6 1.0  PROT 7.3 6.8  ALBUMIN 4.2 4.0   No results for input(s): LIPASE, AMYLASE in the last 168 hours. No results for input(s): AMMONIA in the last 168 hours. Cardiac Enzymes: No results for input(s): CKTOTAL, CKMB, CKMBINDEX, TROPONINI in the last 168 hours. BNP (last 3 results) No results for input(s): BNP in the last 8760 hours.  ProBNP (last 3 results) No results for input(s): PROBNP in the last 8760 hours.  CBG: Recent Labs  Lab 10/13/18 1232  GLUCAP 105*   No results found for this or any previous visit (from the past 240 hour(s)).   Studies: Ct Abdomen Pelvis W Contrast  Result Date: 10/18/2018  CLINICAL DATA:  Fever, hypotension, and abdominal pain. EXAM: CT ABDOMEN AND PELVIS WITH CONTRAST TECHNIQUE: Multidetector CT imaging of the abdomen and pelvis was performed using the standard protocol following bolus administration of intravenous contrast. CONTRAST:  ISOVUE-300 IOPAMIDOL (ISOVUE-300) INJECTION 61% COMPARISON:  Ultrasound right upper quadrant 10/12/2018 FINDINGS: Lower chest: Lung bases are clear. Hepatobiliary: No focal liver abnormality is seen. No gallstones, gallbladder wall thickening, or biliary dilatation. Pancreas: Unremarkable. No pancreatic ductal dilatation or surrounding inflammatory changes. Spleen: Normal in size without focal abnormality. Adrenals/Urinary Tract: Adrenal glands are unremarkable. Kidneys are normal, without renal calculi, focal lesion, or hydronephrosis. Bladder is unremarkable. Stomach/Bowel: Stomach is within normal limits. Appendix appears normal. No evidence of bowel wall thickening, distention, or inflammatory changes. Vascular/Lymphatic: No significant vascular findings are present. No enlarged abdominal or pelvic lymph nodes. Reproductive: Prostate is unremarkable. Other: No abdominal wall hernia or abnormality.  No abdominopelvic ascites. Musculoskeletal: No acute or significant osseous findings. IMPRESSION: No acute process demonstrated in the abdomen or pelvis. No evidence of bowel obstruction or inflammation. Appendix is normal. Electronically Signed   By: Burman Nieves M.D.   On: 10/18/2018 02:31   Dg Chest Port 1 View  Result Date: 10/18/2018 CLINICAL DATA:  Fever and hypotension.  Dizziness.  Current smoker. EXAM: PORTABLE CHEST 1 VIEW COMPARISON:  10/11/2018 FINDINGS: The heart size and mediastinal contours are within normal limits. Both lungs are clear. The visualized skeletal structures are unremarkable. IMPRESSION: No active disease. Electronically Signed   By: Burman Nieves M.D.   On: 10/18/2018 00:32    Scheduled Meds: .  ARIPiprazole  15 mg Oral Daily  . bisacodyl  10 mg Rectal Daily  . enoxaparin (LOVENOX) injection  40 mg Subcutaneous Q24H  . escitalopram  20 mg Oral Daily  . pantoprazole  40 mg Oral Daily  . traZODone  100 mg Oral QHS    Continuous Infusions: . sodium chloride 125 mL/hr at 10/19/18 1005     Joycelyn Das, MD  [facility] 10/19/2018

## 2018-10-19 NOTE — Progress Notes (Signed)
Patient continues to spit in emesis bag clear salavia. Has had one episode of actual emesis after drinking apple juice. He states it's "hard for me to swallow. It makes me gag". PRNs given. Melton Alar, RN

## 2018-10-20 MED ORDER — ARIPIPRAZOLE 2 MG PO TABS: 2.0000 mg | ORAL_TABLET | Freq: Every day | ORAL | 2 refills | Status: DC

## 2018-10-20 MED ORDER — ONDANSETRON HCL 4 MG PO TABS: 4.0000 mg | ORAL_TABLET | Freq: Every day | ORAL | 0 refills | Status: AC | PRN

## 2018-10-20 MED ORDER — ESCITALOPRAM OXALATE 10 MG PO TABS: 20.0000 mg | ORAL_TABLET | Freq: Every day | ORAL | 2 refills | Status: DC

## 2018-10-20 MED ORDER — PANTOPRAZOLE SODIUM 40 MG PO TBEC: 40.0000 mg | DELAYED_RELEASE_TABLET | Freq: Every day | ORAL | 0 refills | Status: DC

## 2018-10-20 NOTE — Progress Notes (Signed)
Patient has requested his diet to be advanced. PCP was notified

## 2018-10-20 NOTE — Discharge Summary (Signed)
Physician Discharge Summary  Marvin Crawford:[id number] DOB: 1996-06-05 DOA: 10/18/2018  PCP: Patient, No Pcp Per  Admit date: 10/18/2018 Discharge date: 10/20/2018  Admitted From: Behavioral health  Discharge disposition: Home   Recommendations for Outpatient Follow-Up:   Follow up with your primary care provider in one week.  Follow-up with psychiatry as scheduled by you   Discharge Diagnosis:   Principal Problem:   Sepsis (HCC), ruled out   Major depressive disorder, recurrent, severe with psychotic features (HCC)   Schizoaffective disorder (HCC)   Intractable nausea and vomiting-improved   Hypokalemia-resolved Viral gastritis  Discharge Condition: Improved.  Diet recommendation:  Regular.  Wound care: None.  Code status: Full.   History of Present Illness:   Marvin Crawford a 23 y.o.malewith medical history significant of schizoaffective disorder, ADD, anxiety, depression presented from behavioral health for evaluation of fever and hypotension. Patient stated that he had been vomiting and feeling dizzy. Mother at bedside stated that patient had been having bilious, nonbloody emesis for the past 3 weeks. He has not been eating much. Patient had been taken the patient to the ED 4 times there has been no explanation for his symptoms.    Hospital Course:   Fever, hypotension with nausea vomiting on presentation.  This could possibly be viral gastritis.  Resolved at this time. Sepsis has been ruled out.  Intractable nausea and few episodes of vomiting : CT scan of the abdomen was unremarkable.   No further vomiting but has been spitting up.  He has had a recurrent ED visits with the same problem.  Could be related to viral gastritis/GERD dyspepsia versus psychogenic in nature.    Will be continued on Protonix on discharge. UDS was negative.    Seen by Dr. Loreta Ave GI was consulted for this but did not recommend any further evaluation. Likely psychogenic component  for spitting.   Ultrasound of the right upper quadrant was negative.  Hypokalemia. Improved with replacement.    Schizoaffective disorder, depression, anxiety -Continue Abilify, Lexapro, trazodone.    Patient was seen by psychiatry.  Doses of Abilify and Lexapro has been decreased.  Disposition: At this time, patient is considered stable for disposition home.  I had a prolonged discussion with the patient's mother on the phone regarding the potential diagnosis.  Patient will be given Zofran and Protonix on discharge.  Doses of psychiatric medication were explained to the patient's mother.    Medical Consultants:    Psychiatry, GI   Subjective:   Today, patient feels better.  Wants to be advanced on his diet.  Still has some spitting.  Discharge Exam:   Vitals:   10/19/18 2130 10/20/18 0454  BP: 135/80 (!) 148/84  Pulse: 60 (!) 55  Resp: 18 18  Temp: 98.9 F (37.2 C) 97.7 F (36.5 C)  SpO2: 100% 100%   Vitals:   10/19/18 0536 10/19/18 1320 10/19/18 2130 10/20/18 0454  BP: 128/82 119/76 135/80 (!) 148/84  Pulse: 85 76 60 (!) 55  Resp:  18 18 18   Temp: 98.7 F (37.1 C) 98.6 F (37 C) 98.9 F (37.2 C) 97.7 F (36.5 C)  TempSrc: Oral Oral Oral Oral  SpO2: 99% 100% 100% 100%  Weight:      Height:        General exam: Appears calm and comfortable ,Not in distress but mildly anxious HEENT:PERRL,Oral mucosa moist Respiratory system: Bilateral equal air entry, normal vesicular breath sounds, no wheezes or crackles  Cardiovascular system: S1 &  S2 heard, RRR.  Gastrointestinal system: Abdomen is nondistended, soft and nontender. No organomegaly or masses felt. Normal bowel sounds heard. Central nervous system: Alert and oriented. No focal neurological deficits. Extremities: No edema, no clubbing ,no cyanosis, distal peripheral pulses palpable. Skin: No rashes, lesions or ulcers,no icterus ,no pallor MSK: Normal muscle bulk,tone ,power  Procedures:    None  The  results of significant diagnostics from this hospitalization (including imaging, microbiology, ancillary and laboratory) are listed below for reference.     Diagnostic Studies:   Ct Abdomen Pelvis W Contrast  Result Date: 10/18/2018 CLINICAL DATA:  Fever, hypotension, and abdominal pain. EXAM: CT ABDOMEN AND PELVIS WITH CONTRAST TECHNIQUE: Multidetector CT imaging of the abdomen and pelvis was performed using the standard protocol following bolus administration of intravenous contrast. CONTRAST:  ISOVUE-300 IOPAMIDOL (ISOVUE-300) INJECTION 61% COMPARISON:  Ultrasound right upper quadrant 10/12/2018 FINDINGS: Lower chest: Lung bases are clear. Hepatobiliary: No focal liver abnormality is seen. No gallstones, gallbladder wall thickening, or biliary dilatation. Pancreas: Unremarkable. No pancreatic ductal dilatation or surrounding inflammatory changes. Spleen: Normal in size without focal abnormality. Adrenals/Urinary Tract: Adrenal glands are unremarkable. Kidneys are normal, without renal calculi, focal lesion, or hydronephrosis. Bladder is unremarkable. Stomach/Bowel: Stomach is within normal limits. Appendix appears normal. No evidence of bowel wall thickening, distention, or inflammatory changes. Vascular/Lymphatic: No significant vascular findings are present. No enlarged abdominal or pelvic lymph nodes. Reproductive: Prostate is unremarkable. Other: No abdominal wall hernia or abnormality. No abdominopelvic ascites. Musculoskeletal: No acute or significant osseous findings.   IMPRESSION: No acute process demonstrated in the abdomen or pelvis. No evidence of bowel obstruction or inflammation. Appendix is normal. Electronically Signed   By: Burman Nieves M.D.   On: 10/18/2018 02:31   Dg Chest Port 1 View  Result Date: 10/18/2018 CLINICAL DATA:  Fever and hypotension.  Dizziness.  Current smoker. EXAM: PORTABLE CHEST 1 VIEW COMPARISON:  10/11/2018 FINDINGS: The heart size and mediastinal  contours are within normal limits. Both lungs are clear. The visualized skeletal structures are unremarkable. IMPRESSION: No active disease. Electronically Signed   By: Burman Nieves M.D.   On: 10/18/2018 00:32   US Abdomen Limited Ruq  Result Date: 10/19/2018 CLINICAL DATA:  Nausea and vomiting EXAM: ULTRASOUND ABDOMEN LIMITED RIGHT UPPER QUADRANT COMPARISON:  Ultrasound right upper quadrant October 12, 2018; CT abdomen and pelvis October 18, 2018 FINDINGS: Gallbladder: No gallstones or wall thickening visualized. There is no pericholecystic fluid. No sonographic Murphy sign noted by sonographer. Common bile duct: Diameter: 4 mm. No intrahepatic or extrahepatic biliary duct dilatation. Liver: No focal lesion identified. Within normal limits in parenchymal echogenicity. Portal vein is patent on color Doppler imaging with normal direction of blood flow towards the liver. IMPRESSION: Study within normal limits. Electronically Signed   By: Bretta Bang III M.D.   On: 10/19/2018 13:33     Labs:   Basic Metabolic Panel: Recent Labs  Lab 10/15/18 1834 10/18/18 0012 10/19/18 0336  NA 138 134* 137  K 3.3* 3.1* 3.6  CL 98 97* 107  CO2 31 27 24   GLUCOSE 104* 124* 99  BUN 14 10 5*  CREATININE 1.27* 1.14 0.92  CALCIUM 10.0 9.3 8.8*  MG  --  2.1  --    GFR Estimated Creatinine Clearance: 167.3 mL/min (by C-G formula based on SCr of 0.92 mg/dL). Liver Function Tests: Recent Labs  Lab 10/15/18 1834 10/18/18 0012  AST 19 21  ALT 32 34  ALKPHOS 66 66  BILITOT  0.6 1.0  PROT 7.3 6.8  ALBUMIN 4.2 4.0   No results for input(s): LIPASE, AMYLASE in the last 168 hours. No results for input(s): AMMONIA in the last 168 hours. Coagulation profile No results for input(s): INR, PROTIME in the last 168 hours.  CBC: Recent Labs  Lab 10/15/18 1834 10/18/18 0012  WBC 4.7 5.9  NEUTROABS  --  3.5  HGB 14.7 14.2  HCT 44.9 41.6  MCV 85.7 83.2  PLT 360 300   Cardiac Enzymes: No results for  input(s): CKTOTAL, CKMB, CKMBINDEX, TROPONINI in the last 168 hours. BNP: Invalid input(s): POCBNP CBG: Recent Labs  Lab 10/13/18 1232  GLUCAP 105*   D-Dimer No results for input(s): DDIMER in the last 72 hours. Hgb A1c No results for input(s): HGBA1C in the last 72 hours. Lipid Profile No results for input(s): CHOL, HDL, LDLCALC, TRIG, CHOLHDL, LDLDIRECT in the last 72 hours. Thyroid function studies No results for input(s): TSH, T4TOTAL, T3FREE, THYROIDAB in the last 72 hours.  Invalid input(s): FREET3 Anemia work up No results for input(s): VITAMINB12, FOLATE, FERRITIN, TIBC, IRON, RETICCTPCT in the last 72 hours. Microbiology Recent Results (from the past 240 hour(s))  Blood Culture (routine x 2)     Status: None (Preliminary result)   Collection Time: 10/18/18 12:12 AM  Result Value Ref Range Status   Specimen Description   Final    BLOOD LEFT HAND Performed at [facility], [address], [place], [place] [address]    Special Requests   Final    BOTTLES DRAWN AEROBIC AND ANAEROBIC Blood Culture adequate volume Performed at [facility], [address] [place], [place] [address]    Culture   Final    NO GROWTH 1 DAY Performed at [facility], [address] [place], [place] [address]    Report Status PENDING  Incomplete  Blood Culture (routine x 2)     Status: None (Preliminary result)   Collection Time: 10/18/18 12:17 AM  Result Value Ref Range Status   Specimen Description   Final    BLOOD RIGHT ANTECUBITAL Performed at [facility], [address] [place], [place] [address]    Special Requests   Final    BOTTLES DRAWN AEROBIC AND ANAEROBIC Blood Culture adequate volume Performed at [facility], [address] [place], [place] [address]    Culture   Final    NO GROWTH 1 DAY Performed at [facility], [address] [place], [place] [address]    Report Status  PENDING  Incomplete     Discharge Instructions:   Discharge Instructions    Diet general   Complete by:  As directed    Try small meals   Discharge instructions   Complete by:  As directed    Follow-up with your primary care physician within 1 week.  Follow-up with your psychiatrist as scheduled by you.  Take nausea medicine prior to eating.   Increase activity slowly   Complete by:  As directed      Allergies as of 10/20/2018   No Known Allergies     Medication List    TAKE these medications   ARIPiprazole 2 MG tablet Commonly known as:  ABILIFY Take 1 tablet (2 mg total) by mouth daily. For mood control What changed:    medication strength  how much to take   escitalopram 10 MG tablet Commonly known as:  LEXAPRO Take 2 tablets (20  mg total) by mouth daily. What changed:  medication strength   fluticasone 50 MCG/ACT nasal spray Commonly known as:  FLONASE Place 1 spray into both nostrils daily.   hydrOXYzine 25 MG tablet Commonly known as:  ATARAX/VISTARIL Take 1 tablet (25 mg total) by mouth every 6 (six) hours as needed for anxiety.   ondansetron 4 MG tablet Commonly known as:  Zofran Take 1 tablet (4 mg total) by mouth daily as needed for up to 30 days for nausea or vomiting.   pantoprazole 40 MG tablet Commonly known as:  PROTONIX Take 1 tablet (40 mg total) by mouth daily for 14 days.   traZODone 100 MG tablet Commonly known as:  DESYREL Take 1 tablet (100 mg) by mouth at bedtime: For sleep      Time coordinating discharge: 39 minutes  Signed:  Jake Fuhrmann  [facility] 10/20/2018, 11:16 AM

## 2018-10-22 NOTE — Progress Notes (Signed)
Patient ID: Marvin Crawford, male   DOB: May 18, 1996, 23 y.o.   MRN: [id number] 10/22/2018-3:30 PM.  Informed by unit RN that  lab had called due to a positive blood culture result.  Of note, I have not personally seen or treated this patient.    In reviewing chart, he was admitted to  psychiatric unit/[address] on 3/10 by  for psychosis. Evaluated by Dr. Jeannine Kitten.  He was referred to ED on 3/12 for fever, hypotension, vomiting.  Was admitted to medical unit.  While on medical unit he was seen by psychiatry consultant, Dr. Jannifer Franklin, at the time it was felt the patient did not meet criteria for further psychiatric inpatient admission.  Was discharged from medical service on 3/15 by Dr. Tyson Babinski.  I have personally spoken with Dr. Tyson Babinski, reviewed above/results called in.  Dr. Tyson Babinski states he will follow-up /contact patient for further treatment recommendations as indicated.  Sallyanne Havers MD

## 2018-10-22 NOTE — Progress Notes (Addendum)
Patient ID: Marvin Crawford, male   DOB: 09/27/1995, 23 y.o.   MRN: [id number]  CRITICAL VALUE ALERT  Critical Value:  Gram Positive Rods in blood culture  Date & Time Notied:  10/22/2018  Provider Notified: MD Cobos  Orders Received/Actions taken: See below   Patient was sent to ED from [facility] on 10/14/2018 with hypotension and nausea/vomiting. Patient was seen at [facility] by MD Jeannine Kitten. Patient was admitted to [facility] and discharged from [facility] on 10/14/2018. [facility] Adult unit nurse received phone call from lab reporting critical lab value today 10/22/2018 at 1430. Writer informed lab that patient was no longer admitted to [facility] and was told "Dr. Jama Flavors is the provider to be notified". Writer notified MD Cobos, however, MD Cobos did not assess or treat patient at [facility]. Per chart review, patient was most recently discharged from [facility] on 10/18/2018. MD Cobos has notified [facility] hospitalist consultant and requested that the on-call provider call back to speak to MD Cobos about the above information.   Thelma Comp, RN 10/22/2018 1515

## 2018-10-24 LAB — CULTURE, BLOOD (ROUTINE X 2): Special Requests: ADEQUATE

## 2018-10-25 NOTE — Progress Notes (Signed)
Pharmacy - antimicrobial stewardship (brief note)  Received updated blood culture information - Proprionibacterium acnes (likely in 2/2 sets as micro said the GPR in 2nd set looks like same organism).  Patient without any risk factors for true bacteremia.  Only had one fever (that prompted Bcx being ordered), no leukocytosis and improved quickly for discharge.    - case discussed with hospitalist and patient was contacted, he was feeling better following discharge.  No further medical treatment needed.  This bacteria is likely contaminant vs transient bacteremia.   Juliette Alcide, PharmD, BCPS.   Work Cell: [phone] 10/25/2018 1:13 PM

## 2018-10-26 LAB — CULTURE, BLOOD (ROUTINE X 2): Special Requests: ADEQUATE
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
## 2019-01-23 IMAGING — CR DG CHEST 2V
2 series · 2 of 2 positions shown · non-contrast
Comparison: None.

CLINICAL DATA: Patient with productive cough.

EXAM:
CHEST - 2 VIEW

[w chest pa]
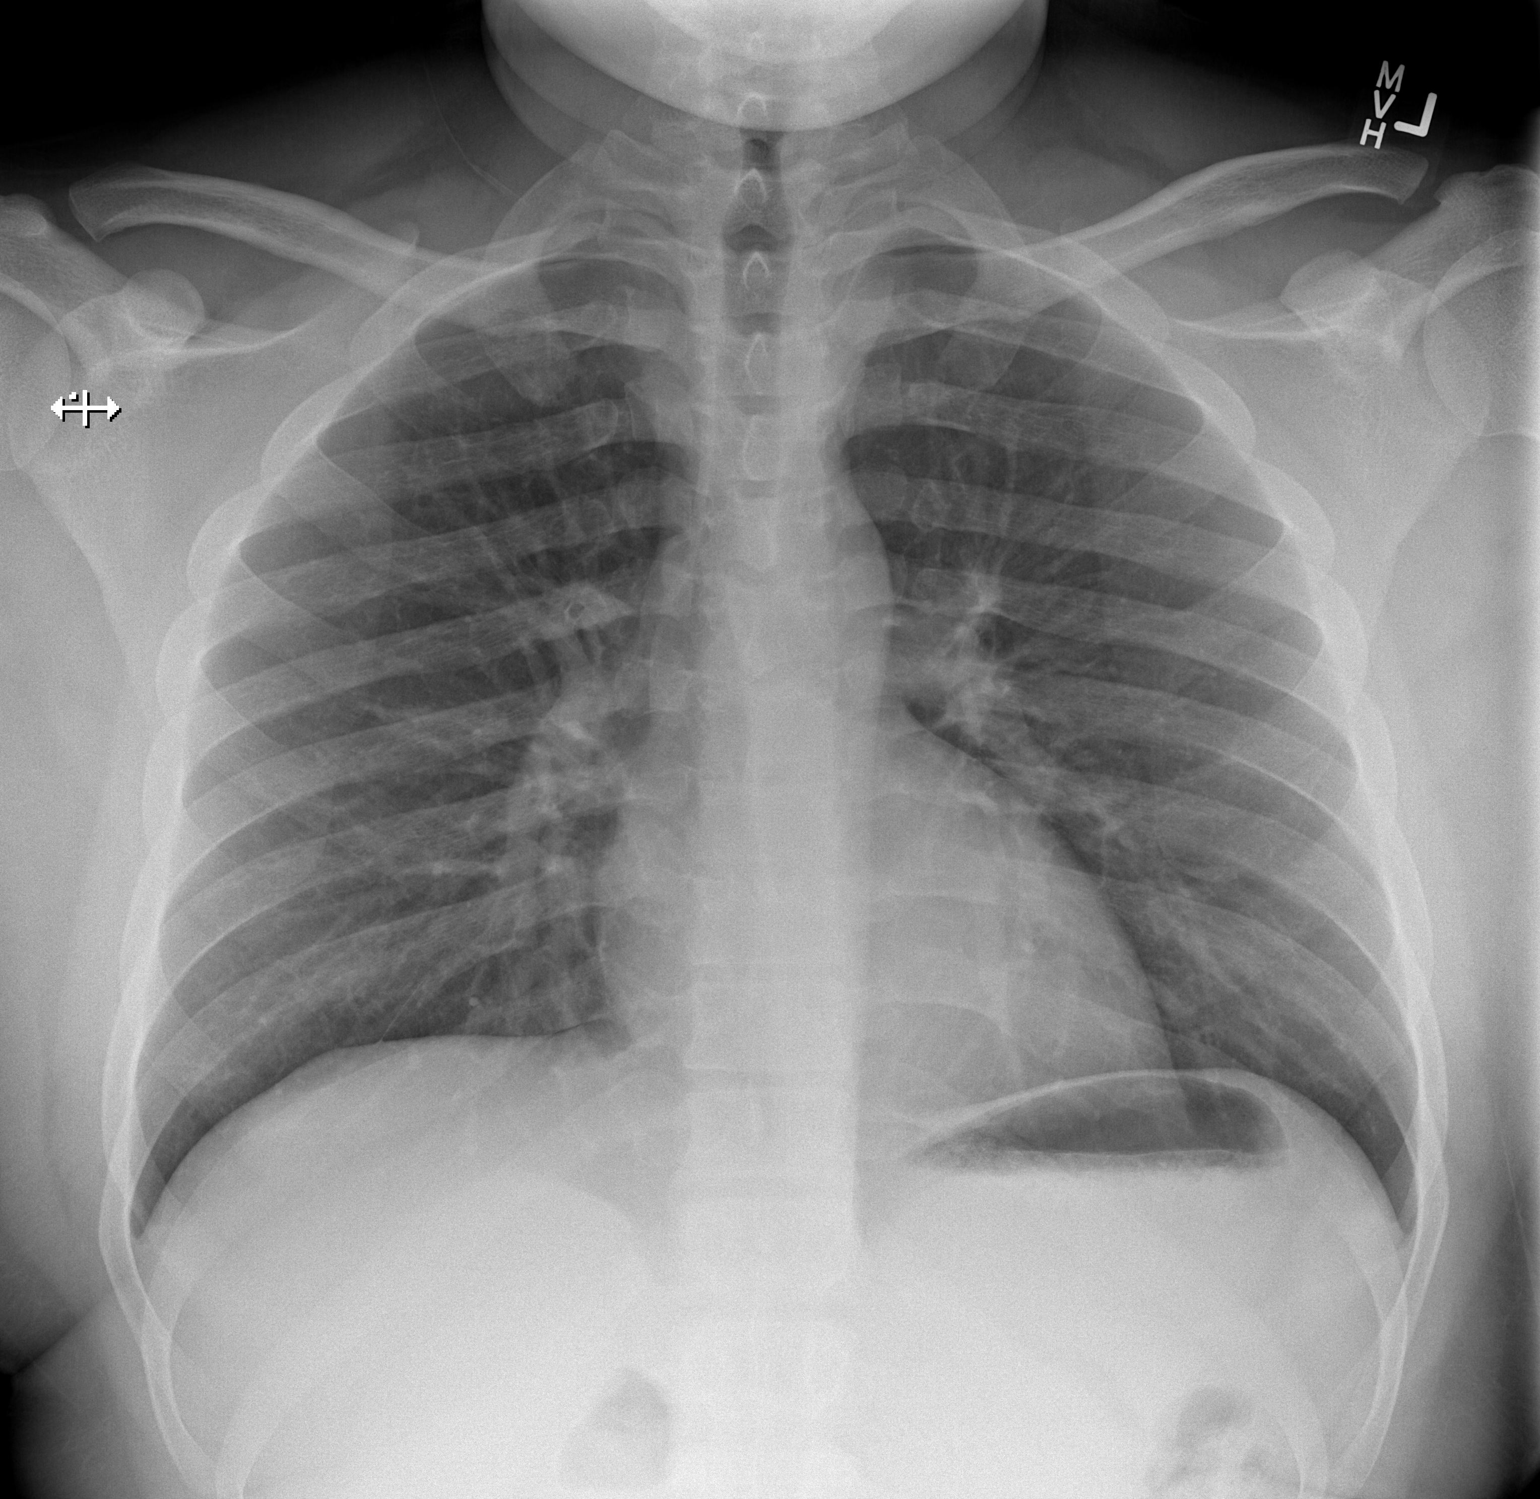

[w chest lat]
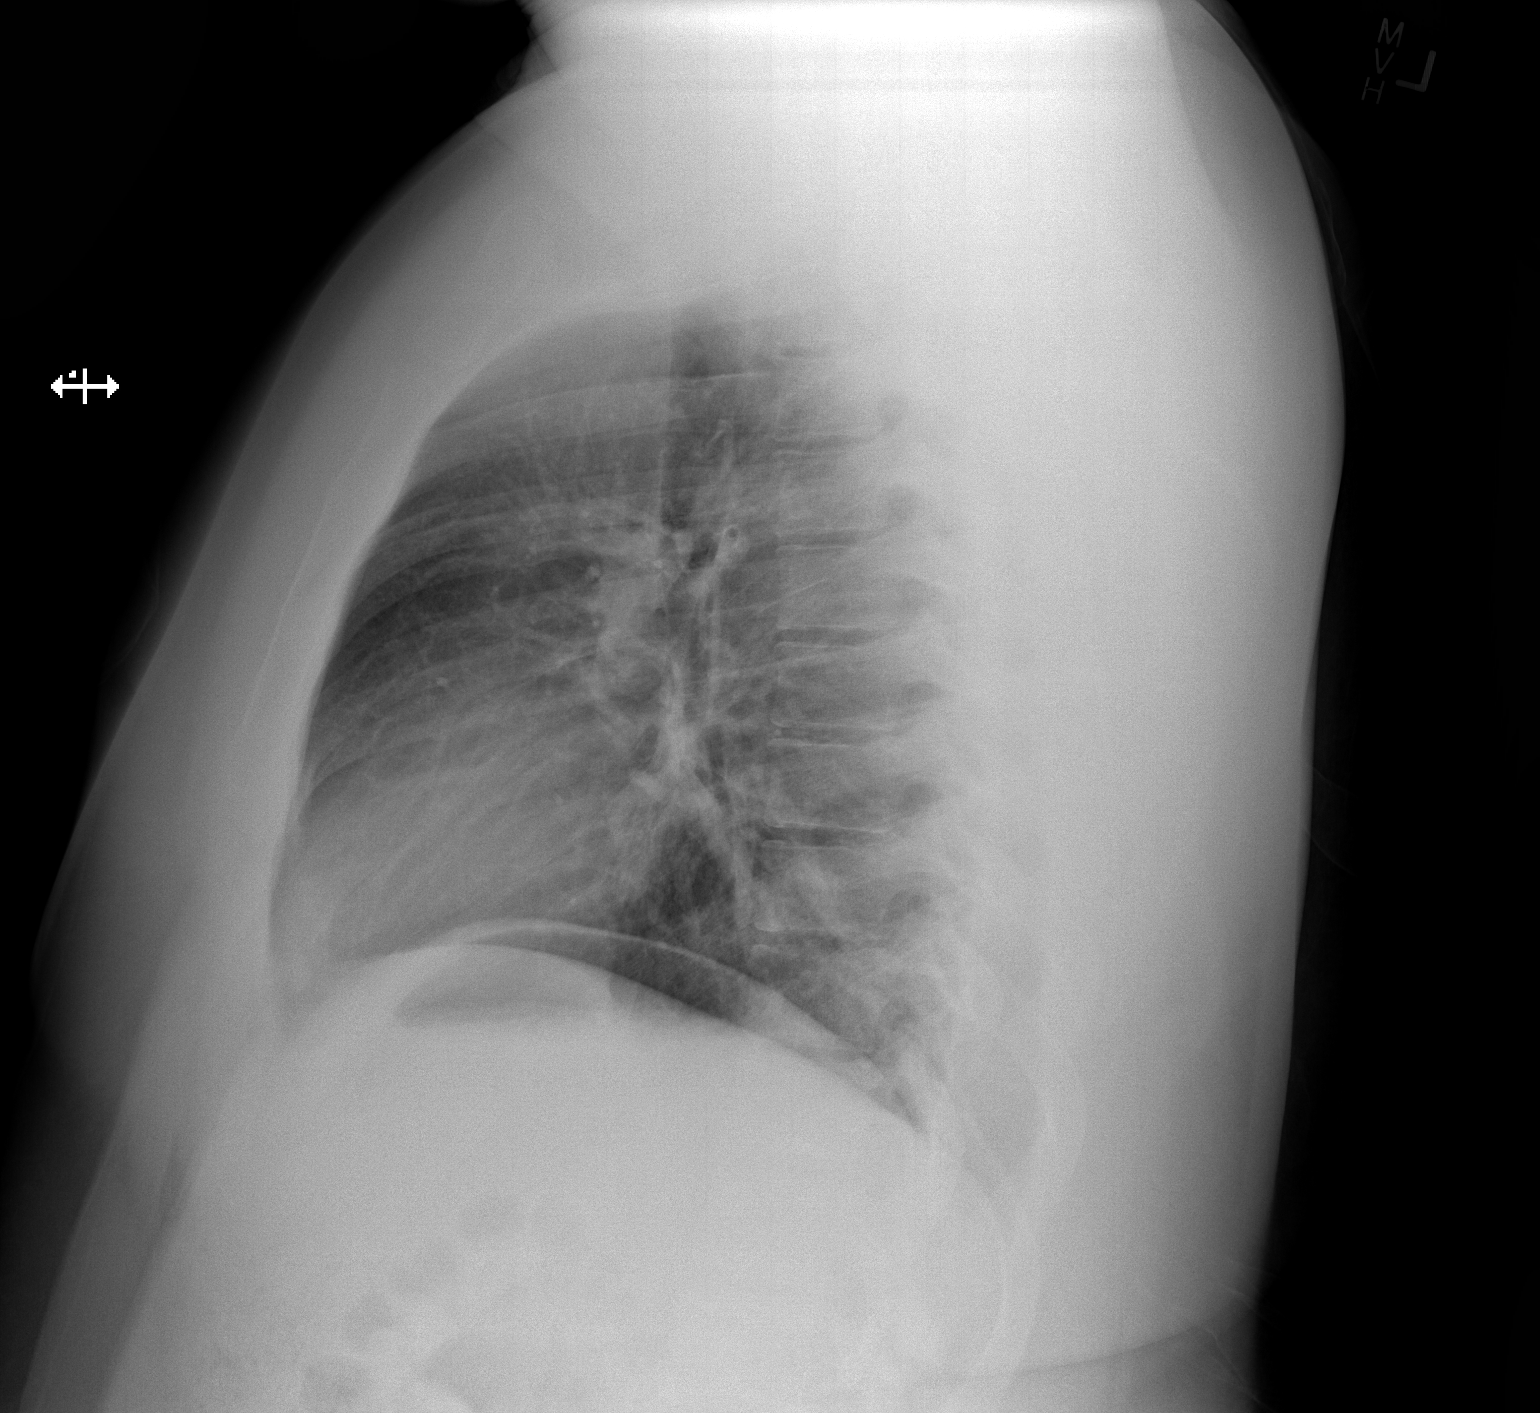

[2 of 2 positions shown; findings below may reference images not displayed]

FINDINGS: Normal cardiac and mediastinal contours. No consolidative pulmonary
opacities. No pleural effusion or pneumothorax.
IMPRESSION: No acute cardiopulmonary process.

## 2019-03-31 ENCOUNTER — Other Ambulatory Visit: Payer: Self-pay

## 2019-03-31 ENCOUNTER — Observation Stay (HOSPITAL_COMMUNITY)
Admission: RE | Admit: 2019-03-31 | Discharge: 2019-04-02 | Disposition: A | Discharge status: Other Acute Inpatient Hospital | Payer: Medicaid Other | Attending: Psychiatry | Admitting: Psychiatry

## 2019-03-31 ENCOUNTER — Encounter (HOSPITAL_COMMUNITY): Payer: Self-pay | Admitting: Emergency Medicine

## 2019-03-31 DIAGNOSIS — R413 Other amnesia: Secondary | ICD-10-CM | POA: Diagnosis not present

## 2019-03-31 DIAGNOSIS — R45850 Homicidal ideations: Secondary | ICD-10-CM | POA: Insufficient documentation

## 2019-03-31 DIAGNOSIS — F419 Anxiety disorder, unspecified: Secondary | ICD-10-CM | POA: Diagnosis not present

## 2019-03-31 DIAGNOSIS — Z79899 Other long term (current) drug therapy: Secondary | ICD-10-CM | POA: Insufficient documentation

## 2019-03-31 DIAGNOSIS — G4700 Insomnia, unspecified: Secondary | ICD-10-CM | POA: Diagnosis not present

## 2019-03-31 DIAGNOSIS — R45851 Suicidal ideations: Secondary | ICD-10-CM | POA: Diagnosis not present

## 2019-03-31 DIAGNOSIS — Z9114 Patient's other noncompliance with medication regimen: Secondary | ICD-10-CM | POA: Insufficient documentation

## 2019-03-31 DIAGNOSIS — F17210 Nicotine dependence, cigarettes, uncomplicated: Secondary | ICD-10-CM | POA: Diagnosis not present

## 2019-03-31 DIAGNOSIS — Z634 Disappearance and death of family member: Secondary | ICD-10-CM | POA: Insufficient documentation

## 2019-03-31 DIAGNOSIS — F329 Major depressive disorder, single episode, unspecified: Secondary | ICD-10-CM | POA: Insufficient documentation

## 2019-03-31 DIAGNOSIS — Z20828 Contact with and (suspected) exposure to other viral communicable diseases: Secondary | ICD-10-CM | POA: Insufficient documentation

## 2019-03-31 DIAGNOSIS — F259 Schizoaffective disorder, unspecified: Principal | ICD-10-CM | POA: Diagnosis present

## 2019-03-31 DIAGNOSIS — Z915 Personal history of self-harm: Secondary | ICD-10-CM | POA: Diagnosis not present

## 2019-03-31 LAB — SARS CORONAVIRUS 2 BY RT PCR (HOSPITAL ORDER, PERFORMED IN ~~LOC~~ HOSPITAL LAB): SARS Coronavirus 2: NEGATIVE

## 2019-03-31 MED ORDER — ACETAMINOPHEN 325 MG PO TABS: 650.0000 mg | ORAL_TABLET | Freq: Four times a day (QID) | ORAL | Status: DC | PRN

## 2019-03-31 MED ORDER — HYDROXYZINE HCL 25 MG PO TABS: 25.0000 mg | ORAL_TABLET | Freq: Three times a day (TID) | ORAL | Status: DC | PRN

## 2019-03-31 MED ORDER — TRAZODONE HCL 100 MG PO TABS: 100.0000 mg | ORAL_TABLET | Freq: Every evening | ORAL | Status: DC | PRN

## 2019-03-31 MED ORDER — MAGNESIUM HYDROXIDE 400 MG/5ML PO SUSP: 30.0000 mL | Freq: Every day | ORAL | Status: DC | PRN

## 2019-03-31 MED ORDER — ALUM & MAG HYDROXIDE-SIMETH 200-200-20 MG/5ML PO SUSP: 30.0000 mL | ORAL | Status: DC | PRN
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
## 2019-03-31 NOTE — BH Assessment (Addendum)
Assessment Note  Marvin Crawford is an 23 y.o. male, who presents voluntary and accompanied to [facility]. Pt consented to have his mother [name](Angela Knox, [phone]) present during the assessment. Clinician asked the pt, "what brought you to the hospital?" Pt reported, he threw his phone out the window while driving down the highway. Pt reported, having pain in his side and stomach to bleed. Pt reported, he punched himself in the stomach last week. [name] reported, the pt was in and out of the hospital all January 2020. [name] reported, she thought the pt was in crisis, he was treated for dehydration but pain continued. [name] reported, pt doctors thought it was psychiatric and recommend the pt take his medications. Pt reported, having suicidal thoughts for a long time, with a plan to hang himself with a belt. Pt reported, seeing black and white pictures in his head and seeing his uncle in his garden today. Pt reported, hearing multiple scary voices telling him to kill himself or hurt other people. Pt reported, two days ago, he made vegetables and mixed in his mothers supplements (took capsules apart)  in but decided against giving it to her. [name] reported, she never know the pt used her supplements. [name] reported, she noticed they were missing but thought she took them. Pt reported the following stressor: loosing his father unexpectedly at the end of May. Pt reported, access to knives but denies current or history of cutting.   Pt denies abuse.  Pt reported, he last smoked marijuana in February 2020. 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 Pt reported, using cocaine once. Pt's UDS is pending. Pt is linked to [facility] for medication management and counseling. Per mother that pt has not taken his medications (Lexapro, Ability, Remeron, Trazodone) in over six months. [name] reported, the pt has memory loss when he is not taking his medications. Pt's  mother reported, the pt will take his medications stop when he feels better then use mariajuana which increases his psychosis. [name] reported, its a cycle. Per mother the pt has previous inpatient admissions due to suicide attempt, psychosis and SI. Per mother in December 2018, the jumped out a second story window as a suicide attempt. [name] reported, the pt stopped his car on the highway, took all his clothes of and walked in the median. Per mother, another occasion she walked in on the pt putting a belt around his neck.    Pt presents quiet, awake with logical, coherent speech. Pt's eye contact was fair. Pt's mood was depressed. Pt's affect was flat. Pt's thought process was coherent, relevant. Pt's judgement is impaired. Pt was oriented x4. Pt's concentration is fair. Pt's insight and impulse control are poor. Pt reported, if [facility] he could not contract for safety. [name] reported, the pt is in crisis and doesn't feel he'll he safe [facility]. Pt reported, if inpatient treatment is recommended he will sign-in voluntarily.   Diagnosis: Schizoaffective disorder, Depressive type.  Past Medical History:  Past Medical History:  Diagnosis Date  . ADD (attention deficit disorder)   . Anxiety   . Depression   . Environmental allergies   . Schizo-affective psychosis (HCC)     No past surgical history on file.  Family History:  Family History  Problem Relation Age of Onset  . Hypertension Mother   . Hypertension Father   . Mental illness Neg  Hx     Social History:  reports that he has been smoking. He has been smoking about 2.00 packs per day. He has never used smokeless tobacco. He reports current drug use. Drugs: Other-see comments and Marijuana. He reports that he does not drink alcohol.  Additional Social History:  Alcohol / Drug Use Pain Medications: See MAR Prescriptions: See MAR Over the Counter: See MAR History of alcohol / drug use?:  Yes Substance #1 Name of Substance 1: Marijuana. 1 - Age of First Use: UTA 1 - Amount (size/oz): Pt reported, he last smoked marijuana in February 2020. 1 - Frequency: UTA 1 - Duration: UTA 1 - Last Use / Amount: February 2020. Substance #2 Name of Substance 2: Alochol. 2 - Age of First Use: UTA 2 - Amount (size/oz): Pt reported, he drink a lot and got alochol poisoned in April 2020. 2 - Frequency: UTA 2 - Duration: UTA 2 - Last Use / Amount: April 2020.  CIWA: CIWA-Ar BP: 118/80 Pulse Rate: 96 COWS:    Allergies: No Known Allergies  Home Medications:  Medications Prior to Admission  Medication Sig Dispense Refill  . ARIPiprazole (ABILIFY) 2 MG tablet Take 1 tablet (2 mg total) by mouth daily. For mood control 30 tablet 2  . escitalopram (LEXAPRO) 10 MG tablet Take 2 tablets (20 mg total) by mouth daily. 30 tablet 2  . fluticasone (FLONASE) 50 MCG/ACT nasal spray Place 1 spray into both nostrils daily. (Patient not taking: Reported on 10/15/2018) 11.1 g 0  . hydrOXYzine (ATARAX/VISTARIL) 25 MG tablet Take 1 tablet (25 mg total) by mouth every 6 (six) hours as needed for anxiety. (Patient not taking: Reported on 10/15/2018) 30 tablet 0  . pantoprazole (PROTONIX) 40 MG tablet Take 1 tablet (40 mg total) by mouth daily for 14 days. 14 tablet 0  . traZODone (DESYREL) 100 MG tablet Take 1 tablet (100 mg) by mouth at bedtime: For sleep (Patient not taking: Reported on 10/15/2018) 14 tablet 0    OB/GYN Status:  No LMP for male patient.  General Assessment Data Location of Assessment: [facility] ED TTS Assessment: In system Is this a Tele or Face-to-Face Assessment?: Face-to-Face Is this an Initial Assessment or a Re-assessment for this encounter?: Initial Assessment Patient Accompanied by:: Parent(Angela Geralynn Ochs, mother, [phone].) Language Other than English: No Living Arrangements: Other (Comment)(Mother. ) What gender do you identify as?: Male Marital status: Single Living  Arrangements: Parent Can pt return to current living arrangement?: Yes Admission Status: Voluntary Is patient capable of signing voluntary admission?: Yes Referral Source: Self/Family/Friend Insurance type: Medicaid.   Medical Screening Exam ([place]) Medical Exam completed: Yes  Crisis Care Plan Living Arrangements: Parent Legal Guardian: Other:(Self. ) Name of Psychiatrist: [facility].  Name of Therapist: [facility].   Education Status Is patient currently in school?: No  Risk to self with the past 6 months Suicidal Ideation: Yes-Currently Present Has patient been a risk to self within the past 6 months prior to admission? : Yes Suicidal Intent: Yes-Currently Present Has patient had any suicidal intent within the past 6 months prior to admission? : Yes Is patient at risk for suicide?: Yes Suicidal Plan?: Yes-Currently Present Has patient had any suicidal plan within the past 6 months prior to admission? : Yes Specify Current Suicidal Plan: Pt has a plan to hang himself with a belt. Access to Means: Yes Specify Access to Suicidal Means: Access to belts, knives.  What has been your use of drugs/alcohol within the  last 12 months?: Pt denies current use.  Previous Attempts/Gestures: Yes How many times?: 2 Other Self Harm Risks: NA Triggers for Past Attempts: Unknown Intentional Self Injurious Behavior: Damaging Comment - Self Injurious Behavior: Pt punched himself in the stimach last week.  Family Suicide History: No Recent stressful life event(s): Loss (Comment)(dad died unexpectently at the end of May. ) Persecutory voices/beliefs?: Yes Depression: Yes Depression Symptoms: Feeling angry/irritable, Feeling worthless/self pity, Loss of interest in usual pleasures, Guilt, Tearfulness Substance abuse history and/or treatment for substance abuse?: No Suicide prevention information given to non-admitted patients: Not applicable  Risk to Others within the past 6  months Homicidal Ideation: Yes-Currently Present Does patient have any lifetime risk of violence toward others beyond the six months prior to admission? : No(Pt denies. ) Thoughts of Harm to Others: Yes-Currently Present Comment - Thoughts of Harm to Others: Pt made vegetable and mixed his mothers supplements in but decided against it (threw food away)  Current Homicidal Intent: Yes-Currently Present Current Homicidal Plan: Yes-Currently Present Describe Current Homicidal Plan: Two days ago, pt made vegetable and mixed his mothers supplements in but decided against giving it to her. Access to Homicidal Means: Yes Describe Access to Homicidal Means: Medicine. Identified Victim: Mother. History of harm to others?: No(Pt denies. ) Assessment of Violence: None Noted Violent Behavior Description: NA Does patient have access to weapons?: Yes (Comment)(Knives, medicine.) Criminal Charges Pending?: No Does patient have a court date: No Is patient on probation?: No  Psychosis Hallucinations: Auditory, Visual Delusions: None noted  Mental Status Report Appearance/Hygiene: Unremarkable Eye Contact: Fair Motor Activity: Unremarkable Speech: Logical/coherent Level of Consciousness: Quiet/awake Mood: Depressed Affect: Flat Anxiety Level: Minimal Thought Processes: Coherent, Relevant Judgement: Impaired Orientation: Person, Place, Time, Situation Obsessive Compulsive Thoughts/Behaviors: None  Cognitive Functioning Concentration: Fair Memory: Recent Impaired Is patient IDD: No Insight: Poor Impulse Control: Poor Appetite: Poor Have you had any weight changes? : No Change Sleep: No Change Total Hours of Sleep: 7 Vegetative Symptoms: Not bathing, Decreased grooming  ADLScreening [facility]) Patient's cognitive ability adequate to safely complete daily activities?: Yes Patient able to express need for assistance with ADLs?: Yes Independently performs ADLs?: Yes  (appropriate for developmental age)  Prior Inpatient Therapy Prior Inpatient Therapy: Yes Prior Therapy Dates: At age, [age], [age] 20/21.  [facility] Reason for Treatment: SI, psychosis.   Prior Outpatient Therapy Prior Outpatient Therapy: Yes Prior Therapy Dates: Over six months ago.  Prior Therapy Facilty/Provider(s): [facility]. Reason for Treatment: Medication management and counseling.  Does patient have an ACCT team?: No Does patient have Intensive In-House Services?  : No Does patient have [facility] services? : No Does patient have P4CC services?: No  ADL Screening (condition at time of admission) Patient's cognitive ability adequate to safely complete daily activities?: Yes Is the patient deaf or have difficulty hearing?: No Does the patient have difficulty seeing, even when wearing glasses/contacts?: No Does the patient have difficulty concentrating, remembering, or making decisions?: Yes Patient able to express need for assistance with ADLs?: Yes Does the patient have difficulty dressing or bathing?: No Independently performs ADLs?: Yes (appropriate for developmental age) Does the patient have difficulty walking or climbing stairs?: No Weakness of Legs: None(Pain in side and stomach.) Weakness of Arms/Hands: None  Home Assistive Devices/Equipment Home Assistive Devices/Equipment: None    Abuse/Neglect Assessment (Assessment to be complete while patient is alone) Abuse/Neglect Assessment Can Be Completed: Yes Physical Abuse: Denies(Pt denies.) Verbal Abuse: Denies(Pt  denies.) Sexual Abuse: Denies(Pt denies.) Exploitation of patient/patient's resources: Denies(Pt denies. \) Self-Neglect: Denies(Pt denies.)     Advance Directives (For Healthcare) Does Patient Have a Medical Advance Directive?: No          Disposition: Nira ConnJason Berry, NP recommends inpatient treatment. Pt to be admitted to the OBS Unit. TTS to seek  placement.    Disposition Initial Assessment Completed for this Encounter: Yes  On Site Evaluation by: Redmond Pullingreylese D Linsey Arteaga, MS, [facility], [facility].  Reviewed with Physician: Nira ConnJason Berry, NP.   Redmond Pullingreylese D Coila Wardell 03/31/2019 9:52 PM    Redmond Pullingreylese D Nicholas Ossa, MS, [facility], [facility] Triage Specialist [phone]

## 2019-03-31 NOTE — Plan of Care (Signed)
[facility] Crisis Plan  Reason for Crisis Plan:  Crisis Stabilization   Plan of Care:  Referral for Inpatient Hospitalization  Family Support:   yes   Current Living Environment:  Living Arrangements: Parent  Insurance:   Hospital Account    Name Acct ID Class Status Primary Coverage   Adeeb, [place] [id number] [place]        Guarantor Account (for Hospital Account [id number])    Name Relation to Pt Service Area Active? Acct Type   Sherlynn Stalls Self [facility] Yes Behavioral [facility]   Address Phone       [address] [place], [place] [address] [phone])          Coverage Information (for Hospital Account [id number])    F/O Payor/Plan Precert #   [facility] MEDICAID/[place]    Subscriber Subscriber #   Josyah, Achor [id number]   Address Phone   [address] [place], [place] [address] [phone]      Legal Guardian:  Legal Guardian: Other:(Self. )  Primary Care Provider:  Patient, No Pcp Per  Current Outpatient Providers:  Buford Dresser, DO  Psychiatrist:  Name of Psychiatrist: [facility].   Counselor/Therapist:  Name of Therapist: [facility].   Compliant with Medications:  No  Additional Information:   [name] 8/24/202010:18 PM
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
## 2019-03-31 NOTE — Progress Notes (Signed)
Pt meets inpatient criteria per Lindon Romp, NP. Referral information has been sent to the following hospitals for review:  [facility]      Disposition will continue to assist with inpatient placement needs.   Audree Camel, LCSW, [facility] Disposition [facility]/TTS [phone]

## 2019-04-01 ENCOUNTER — Encounter (HOSPITAL_COMMUNITY): Payer: Self-pay | Admitting: Registered Nurse

## 2019-04-01 DIAGNOSIS — F250 Schizoaffective disorder, bipolar type: Secondary | ICD-10-CM

## 2019-04-01 DIAGNOSIS — F259 Schizoaffective disorder, unspecified: Secondary | ICD-10-CM | POA: Diagnosis not present

## 2019-04-01 LAB — COMPREHENSIVE METABOLIC PANEL
ALT: 24 U/L (ref 0–44)
AST: 17 U/L (ref 15–41)
Albumin: 4.3 g/dL (ref 3.5–5.0)
Alkaline Phosphatase: 64 U/L (ref 38–126)
Anion gap: 7 (ref 5–15)
BUN: 10 mg/dL (ref 6–20)
CO2: 27 mmol/L (ref 22–32)
Calcium: 10 mg/dL (ref 8.9–10.3)
Chloride: 106 mmol/L (ref 98–111)
Creatinine, Ser: 1.26 mg/dL — ABNORMAL HIGH (ref 0.61–1.24)
GFR calc Af Amer: >60 mL/min (ref >60)
GFR calc non Af Amer: >60 mL/min (ref >60)
Glucose, Bld: 109 mg/dL — ABNORMAL HIGH (ref 70–99)
Potassium: 4.3 mmol/L (ref 3.5–5.1)
Sodium: 140 mmol/L (ref 135–145)
Total Bilirubin: 1.1 mg/dL (ref 0.3–1.2)
Total Protein: 7.4 g/dL (ref 6.5–8.1)

## 2019-04-01 LAB — CBC
HCT: 45.8 % (ref 39.0–52.0)
Hemoglobin: 15.3 g/dL (ref 13.0–17.0)
MCH: 29.1 pg (ref 26.0–34.0)
MCHC: 33.4 g/dL (ref 30.0–36.0)
MCV: 87.2 fL (ref 80.0–100.0)
Platelets: 277 10*3/uL (ref 150–400)
RBC: 5.25 MIL/uL (ref 4.22–5.81)
RDW: 12.8 % (ref 11.5–15.5)
WBC: 6.4 10*3/uL (ref 4.0–10.5)
nRBC: 0 % (ref 0.0–0.2)

## 2019-04-01 MED ORDER — OLANZAPINE 5 MG PO TABS
5.0000 mg | ORAL_TABLET | Freq: Two times a day (BID) | ORAL | Status: DC
  Administered 2019-04-01 – 2019-04-02 (×2): 5 mg via ORAL
  Filled 2019-04-01 (×2): qty 1

## 2019-04-01 NOTE — Progress Notes (Signed)
D:Pt is awake lying in his bed. Pt reports HI thoughts and is vague not giving any names of who he is feeling HI toward. When asked what the thoughts were related to, he responded the death of his father.  Pt currently denies SI thoughts. Pt reports that he has hallucinations and has been off of his medication. Pt does not appear to be responding to internal stimuli at this time.  A:Offered support, encouragement and 15 minute checks.  R:Safety maintained in the OBS unit.

## 2019-04-01 NOTE — Progress Notes (Signed)
Patient ID: Marvin Crawford, male   DOB: 1996/07/02, 23 y.o.   MRN: [id number] Pt A&O x 3, no distress noted, calm & cooperative, monitoring for safety.

## 2019-04-01 NOTE — H&P (Signed)
Behavioral Health Medical Screening Exam  Marvin Crawford is an 23 y.o. male.  Psychiatric Specialty Exam: Physical Exam  Constitutional: He is oriented to person, place, and time. He appears well-developed and well-nourished. No distress.  HENT:  Head: Normocephalic and atraumatic.  Right Ear: External ear normal.  Left Ear: External ear normal.  Eyes: Pupils are equal, round, and reactive to light. Right eye exhibits no discharge. Left eye exhibits no discharge.  Respiratory: Effort normal. No respiratory distress.  Musculoskeletal: Normal range of motion.  Neurological: He is alert and oriented to person, place, and time.  Skin: He is not diaphoretic.  Psychiatric: His mood appears anxious. He is withdrawn. He is not actively hallucinating. Thought content is not paranoid and not delusional. Cognition and memory are not impaired. He exhibits a depressed mood. He expresses homicidal and suicidal ideation. He expresses suicidal plans. He expresses no homicidal plans.    Review of Systems  Constitutional: Negative for chills, diaphoresis, fever, malaise/fatigue and weight loss.  Respiratory: Negative for cough and shortness of breath.   Cardiovascular: Negative for chest pain.  Gastrointestinal: Negative for diarrhea, nausea and vomiting.  Psychiatric/Behavioral: Positive for depression, hallucinations and suicidal ideas. Negative for memory loss and substance abuse. The patient is nervous/anxious and has insomnia.   All other systems reviewed and are negative.   Blood pressure 118/80, pulse 96, temperature 98.5 F (36.9 C), resp. rate 18, SpO2 100 %.There is no height or weight on file to calculate BMI.  General Appearance: Casual and Well Groomed  Eye Contact:  Minimal  Speech:  Clear and Coherent and Normal Rate  Volume:  Decreased  Mood:  Anxious, Depressed, Hopeless and Worthless  Affect:  Blunt  Thought Process:  Coherent and Descriptions of Associations: Loose  Orientation:   Full (Time, Place, and Person)  Thought Content:  Hallucinations: Auditory Command:  voices tell him to hurt himself and others Visual  Suicidal Thoughts:  Yes.  with intent/plan  Homicidal Thoughts:  Yes.  without intent/plan  Memory:  Immediate;   Fair Recent;   Fair  Judgement:  Impaired  Insight:  Lacking  Psychomotor Activity:  Decreased  Concentration:  Concentration: Fair and Attention Span: Fair  Recall:  [employer] of Knowledge:  Fair  Language:  Fair  Akathisia:  Negative  Handed:  Right  AIMS (if indicated):     Assets:  Desire for Improvement Financial Resources/Insurance Housing Intimacy Leisure Time Physical Health  ADL's:  Intact  Cognition:  Impaired,  Mild  Sleep:        Blood pressure 118/80, pulse 96, temperature 98.5 F (36.9 C), resp. rate 18, SpO2 100 %.  Recommendations:  Based on my evaluation the patient does not appear to have an emergency medical condition.  Rozetta Nunnery, NP 04/01/2019, 4:27 AM
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
## 2019-04-02 ENCOUNTER — Inpatient Hospital Stay (HOSPITAL_COMMUNITY)
Admission: AD | Admit: 2019-04-02 | Discharge: 2019-04-07 | DRG: 885 | Disposition: A | Discharge status: Home / Self Care | Payer: Medicaid Other | Source: Intra-hospital | Attending: Emergency Medicine | Admitting: Emergency Medicine

## 2019-04-02 ENCOUNTER — Other Ambulatory Visit: Payer: Self-pay

## 2019-04-02 ENCOUNTER — Encounter (HOSPITAL_COMMUNITY): Payer: Self-pay

## 2019-04-02 DIAGNOSIS — S0300XA Dislocation of jaw, unspecified side, initial encounter: Secondary | ICD-10-CM | POA: Diagnosis present

## 2019-04-02 DIAGNOSIS — F419 Anxiety disorder, unspecified: Secondary | ICD-10-CM | POA: Diagnosis present

## 2019-04-02 DIAGNOSIS — F1220 Cannabis dependence, uncomplicated: Secondary | ICD-10-CM | POA: Diagnosis present

## 2019-04-02 DIAGNOSIS — F17210 Nicotine dependence, cigarettes, uncomplicated: Secondary | ICD-10-CM | POA: Diagnosis present

## 2019-04-02 DIAGNOSIS — Z915 Personal history of self-harm: Secondary | ICD-10-CM

## 2019-04-02 DIAGNOSIS — R45851 Suicidal ideations: Secondary | ICD-10-CM | POA: Diagnosis present

## 2019-04-02 DIAGNOSIS — F250 Schizoaffective disorder, bipolar type: Secondary | ICD-10-CM | POA: Diagnosis not present

## 2019-04-02 DIAGNOSIS — F259 Schizoaffective disorder, unspecified: Secondary | ICD-10-CM | POA: Diagnosis present

## 2019-04-02 DIAGNOSIS — F251 Schizoaffective disorder, depressive type: Secondary | ICD-10-CM | POA: Diagnosis not present

## 2019-04-02 DIAGNOSIS — R45850 Homicidal ideations: Secondary | ICD-10-CM | POA: Diagnosis present

## 2019-04-02 DIAGNOSIS — K056 Periodontal disease, unspecified: Secondary | ICD-10-CM | POA: Diagnosis present

## 2019-04-02 LAB — RAPID URINE DRUG SCREEN, HOSP PERFORMED
Amphetamines: NOT DETECTED
Barbiturates: NOT DETECTED
Benzodiazepines: NOT DETECTED
Cocaine: NOT DETECTED
Opiates: NOT DETECTED
Tetrahydrocannabinol: NOT DETECTED

## 2019-04-02 MED ORDER — PROPRANOLOL HCL 20 MG PO TABS
20.0000 mg | ORAL_TABLET | Freq: Two times a day (BID) | ORAL | Status: DC
  Administered 2019-04-02 – 2019-04-07 (×10): 20 mg via ORAL
  Filled 2019-04-02 (×9): qty 1
  Filled 2019-04-02: qty 2
  Filled 2019-04-02 (×5): qty 1

## 2019-04-02 MED ORDER — TEMAZEPAM 30 MG PO CAPS
30.0000 mg | ORAL_CAPSULE | Freq: Every day | ORAL | Status: DC
  Administered 2019-04-02 – 2019-04-06 (×4): 30 mg via ORAL
  Filled 2019-04-02 (×4): qty 1

## 2019-04-02 MED ORDER — ALUM & MAG HYDROXIDE-SIMETH 200-200-20 MG/5ML PO SUSP: 30.0000 mL | ORAL | Status: DC | PRN

## 2019-04-02 MED ORDER — BENZTROPINE MESYLATE 1 MG PO TABS
1.0000 mg | ORAL_TABLET | Freq: Four times a day (QID) | ORAL | Status: DC | PRN
  Administered 2019-04-04 – 2019-04-06 (×3): 1 mg via ORAL
  Filled 2019-04-02 (×3): qty 1

## 2019-04-02 MED ORDER — HALOPERIDOL 5 MG PO TABS
10.0000 mg | ORAL_TABLET | Freq: Three times a day (TID) | ORAL | Status: DC
  Administered 2019-04-02 – 2019-04-07 (×14): 10 mg via ORAL
  Filled 2019-04-02 (×22): qty 2

## 2019-04-02 MED ORDER — ACETAMINOPHEN 325 MG PO TABS
650.0000 mg | ORAL_TABLET | Freq: Four times a day (QID) | ORAL | Status: DC | PRN
  Administered 2019-04-04 – 2019-04-05 (×3): 650 mg via ORAL
  Filled 2019-04-02 (×3): qty 2

## 2019-04-02 MED ORDER — MAGNESIUM HYDROXIDE 400 MG/5ML PO SUSP: 30.0000 mL | Freq: Every day | ORAL | Status: DC | PRN

## 2019-04-02 MED ORDER — BENZTROPINE MESYLATE 1 MG PO TABS
1.0000 mg | ORAL_TABLET | Freq: Two times a day (BID) | ORAL | Status: DC
  Administered 2019-04-02 – 2019-04-04 (×5): 1 mg via ORAL
  Filled 2019-04-02 (×9): qty 1

## 2019-04-02 MED ORDER — HALOPERIDOL 5 MG PO TABS
5.0000 mg | ORAL_TABLET | Freq: Four times a day (QID) | ORAL | Status: DC | PRN
  Administered 2019-04-06: 5 mg via ORAL
  Filled 2019-04-02: qty 1

## 2019-04-02 NOTE — Progress Notes (Signed)
D: Pt passive SI/ AVH- contracts for safety. Pt is pleasant and cooperative. Pt visible on the unit this evening A: Pt was offered support and encouragement. Pt was given scheduled medications. Pt was encourage to attend groups. Q 15 minute checks were done for safety.  R:Pt attends groups and interacts well with peers and staff. Pt is taking medication. Pt has no complaints.Pt receptive to treatment and safety maintained on unit.

## 2019-04-02 NOTE — Progress Notes (Signed)
Pt complaining of pain and itching to genital area, stating he has a yeast infection.  NP Rashawn Dixon notified.  Pt in no distress at present, resting comfortably.

## 2019-04-02 NOTE — Progress Notes (Signed)
Pt was brought to the 500 hall from the OBS unit today. Pt's pulse was elevated. 1700 medications were given and pulse rechecked. Reported to Dr. Mariea Clonts and completed an EKG as ordered. Pt reports no signs and symptoms of distress. Pt does c/o a yeast infection that he says started prior to admission. Pt says that he has si, hi and voices with each decreasing since he came to the hospital and started medication. He is currently talking on the phone to his mother. Safety maintained on the unit.

## 2019-04-03 ENCOUNTER — Encounter (HOSPITAL_COMMUNITY): Payer: Self-pay

## 2019-04-03 DIAGNOSIS — F250 Schizoaffective disorder, bipolar type: Secondary | ICD-10-CM

## 2019-04-03 NOTE — Progress Notes (Signed)
Adult Psychoeducational Group Note  Date:  04/03/2019 Time:  9:05 PM  Group Topic/Focus:   Wrap-Up Group:   The focus of this group is to help patients review their daily goal of treatment and discuss progress on daily workbooks.  Participation Level:  Minimal  Participation Quality:  Appropriate  Affect:  Appropriate  Cognitive:  Oriented  Insight: Improving  Engagement in Group:  Engaged  Modes of Intervention:  Education and Support  Additional Comments:  Patient attended and participated in group tonight. He reports that the day was good for him. He went to the GYM with the group. Its good to be back on his right medication. Today he also went for meals and attended groups.  Salley Scarlet [facility] 04/03/2019, 9:05 PM

## 2019-04-03 NOTE — Progress Notes (Signed)
Recreation Therapy Notes  Date: 8.27.20 Time: 1000 Location: 500 Hall Dayroom   Group Topic: Communication, Team Building, Problem Solving  Goal Area(s) Addresses:  Patient will effectively work with peer towards shared goal.  Patient will identify skill used to make activity successful.  Patient will identify how skills used during activity can be used to reach post d/c goals.   Intervention: STEM Activity   Activity: [place]. Patients were provided the following materials: 5 drinking straws, 5 rubber bands, 5 paper clips, 2 index cards and 2 drinking cups. Using the provided materials patients were asked to build Crawford launching mechanisms to launch Crawford ping pong ball approximately 12 feet. Patients were divided into teams of 3-5.   Education: Social Skills, Discharge Planning.   Education Outcome: Acknowledges education/In group clarification offered/Needs additional education.   Clinical Observations/Feedback: Pt did not attend group.     Marvin Crawford, LRT/CTRS         Marvin Crawford 04/03/2019 12:19 PM 

## 2019-04-03 NOTE — [facility] Group Notes (Signed)
LCSW Group Therapy Note  Date and Time: 04/03/2019 @ 1:30pm  Type of Therapy and Topic: Group Therapy: Feelings Around Returning Home & Establishing a Supportive Framework and Supporting Oneself When Supports Not Available  Participation Level: [facility] PARTICIPATION LEVEL: Did Not Attend  Mood: Did not attend     Description of Group:  Patients first processed thoughts and feelings about upcoming discharge. These included fears of upcoming changes, lack of change, new living environments, judgements and expectations from others and overall stigma of mental health issues. The group then discussed the definition of a supportive framework, what that looks and feels like, and how do to discern it from an unhealthy non-supportive network. The group identified different types of supports as well as what to do when your family/friends are less than helpful or unavailable     Therapeutic Goals   1.  Patient will identify one healthy supportive network that they can use at discharge.  2.  Patient will identify one factor of a supportive framework and how to tell it from an unhealthy network.  3.  Patient able to identify one coping skill to use when they do not have positive supports from others.  4.  Patient will demonstrate ability to communicate their needs through discussion and/or role plays.     Summary of Patient Progress:     Patient did not attend group today. Announcement was made over the intercom for all patients for SW group. Patient was asleep.        Therapeutic Modalities  Cognitive Behavioral Therapy  Motivational Interviewing   Pharaoh Pio, MSW, LCSW  

## 2019-04-03 NOTE — [facility] Counselor (Signed)
CSW attempted to meet with patient for his assessment. Patient was asleep and struggled to awake. This is the patient's 1st attempt.

## 2019-04-03 NOTE — [facility] Suicide Risk Assessment (Signed)
[facility] Admission Suicide Risk Assessment   Nursing information obtained from:  Patient Demographic factors:  Male Current Mental Status:  NA Loss Factors:  NA Historical Factors:  Prior suicide attempts Risk Reduction Factors:  Living with another person, especially a relative  Total Time spent with patient: 45 minutes Principal Problem: <principal problem not specified> Diagnosis:  Active Problems:   Cannabis use disorder, moderate, dependence (HCC)   Schizoaffective disorder (HCC)  Subjective Data: see hpi  Continued Clinical Symptoms:  Alcohol Use Disorder Identification Test Final Score (AUDIT): 0 The "Alcohol Use Disorders Identification Test", Guidelines for Use in Primary Care, Second Edition.  World Pharmacologist [facility]). Score between 0-7:  no or low risk or alcohol related problems. Score between 8-15:  moderate risk of alcohol related problems. Score between 16-19:  high risk of alcohol related problems. Score 20 or above:  warrants further diagnostic evaluation for alcohol dependence and treatment.   CLINICAL FACTORS:   Schizophrenia:   Paranoid or undifferentiated type Musculoskeletal: Strength & Muscle Tone: within normal limits Gait & Station: normal Patient leans: N/A  Psychiatric Specialty Exam: Physical Exam  Constitutional: He is oriented to person, place, and time. He appears well-developed and well-nourished.  HENT:  Head: Normocephalic.  Neck: Normal range of motion.  Musculoskeletal: Normal range of motion.  Neurological: He is alert and oriented to person, place, and time.     Review of Systems  Respiratory: Positive for shortness of breath.        SOB when feeling anxious.   Cardiovascular: Negative for chest pain and palpitations.       Feels heart racing when feeling anxious.  Skin: Positive for itching.       Scalp itching due to ringworm.   Neurological: Negative for headaches.  Psychiatric/Behavioral: Positive for depression,  substance abuse and suicidal ideas. The patient is nervous/anxious.     Blood pressure 116/61, pulse (!) 124, temperature 97.9 F (36.6 C), temperature source Oral, resp. rate 16, height 6' 2.25" (1.886 m), weight 117.5 kg, SpO2 98 %.Body mass index is 33.03 kg/m.  General Appearance: Casual and Fairly Groomed  Eye Contact:  Minimal  Speech:  Blocked and Slow  Volume:  Decreased  Mood:  Depressed  Affect:  Congruent, Depressed and Flat  Thought Process:  Disorganized and Descriptions of Associations: Loose  Orientation:  Full (Time, Place, and Person)  Thought Content:  Delusions and Paranoid Ideation  Suicidal Thoughts:  No  Homicidal Thoughts:  No  Memory:  Immediate;   Good Recent;   Good  Judgement:  Other:  When asked what he would do if he saw a building on fire, he said he would call 911. However, he is easily peer pressured by friends to smoke and do drugs, so his judgment may be fair-poor.  Insight:  Lacking  Psychomotor Activity:  Normal  Concentration:  Concentration: Fair  Recall:  Good  Fund of Knowledge:  Fair  Language:  Fair  Akathisia:  No  Handed:  Right  AIMS (if indicated):     Assets:  Housing Vocational/Educational  ADL's:  Intact  Cognition:  WNL  Sleep:  Number of Hours: 6.25      COGNITIVE FEATURES THAT CONTRIBUTE TO RISK:  Loss of executive function    SUICIDE RISK:   Mild:  Suicidal ideation of limited frequency, intensity, duration, and specificity.  There are no identifiable plans, no associated intent, mild dysphoria and related symptoms, good self-control (both objective and subjective assessment), few other risk  factors, and identifiable protective factors, including available and accessible social support.  PLAN OF CARE: see eval  I certify that inpatient services furnished can reasonably be expected to improve the patient's condition.   Malvin JohnsFARAH,Kalief Kattner, MD 04/03/2019, 9:18 AM
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
## 2019-04-03 NOTE — Plan of Care (Signed)
Progress note  D: pt found in bed; compliant with medication administration. Pt denies any physical complaints or pain. Pt is minimal but assertive and pleasant. Pt has rested most of the day and is reclusive to his room. Pt denies si/hi/ah/vh and verbally agrees to approach staff if these become apparent or before harming himself/others while at [place]: Pt provided support and encouragement. Pt given medication per protocol and standing orders. Q70m safety checks implemented and continued.  R: Pt safe on the unit. Will continue to monitor.  Pt progressing in the following metrics  Problem: Education: Goal: Knowledge of [facility] Education information/materials will improve Outcome: Progressing Goal: Emotional status will improve Outcome: Progressing Goal: Mental status will improve Outcome: Progressing Goal: Verbalization of understanding the information provided will improve Outcome: Progressing

## 2019-04-03 NOTE — Progress Notes (Signed)
Recreation Therapy Notes  Patient admitted to unit 8.26.20. Due to admission within last year, no new assessment conducted at this time. Last assessment conducted 3.10.20. Patient reports reason for current admission was "I was trying to make friends and became overwhelmed".  Pt also stated his was dealing with the passing of his father, who the patient states died in late 2023-01-09".  Pt identified his current stressors as the passing of his father.  Pt identified his strengths as being smart, intelligent and multitasking.  Pt stated his coping skills were isolation, self injury, journal, tv, arguments, music, meditation, deep breathing, substance use, impulsive behavior, talk, prayer, avoidance, read, dance and hot bath/shower.   Patient denies SI, HI, AVH at this time. Patient reports goal of making new friends at work.      [name], LRT/CTRS    [name] A 04/03/2019 12:28 PM

## 2019-04-04 MED ORDER — DIPHENHYDRAMINE HCL 50 MG/ML IJ SOLN
INTRAMUSCULAR | Status: AC
  Administered 2019-04-04: 19:00:00 50 mg
  Filled 2019-04-04: qty 1

## 2019-04-04 MED ORDER — BENZTROPINE MESYLATE 1 MG/ML IJ SOLN
1.0000 mg | Freq: Once | INTRAMUSCULAR | Status: AC
  Administered 2019-04-04: 1 mg via INTRAMUSCULAR
  Filled 2019-04-04: qty 1
  Filled 2019-04-04: qty 2

## 2019-04-04 MED ORDER — DIPHENHYDRAMINE HCL 50 MG/ML IJ SOLN: 50.0000 mg | Freq: Once | INTRAMUSCULAR | Status: DC

## 2019-04-04 MED ORDER — BENZTROPINE MESYLATE 2 MG PO TABS
2.0000 mg | ORAL_TABLET | Freq: Two times a day (BID) | ORAL | Status: DC
  Administered 2019-04-05 – 2019-04-07 (×5): 2 mg via ORAL
  Filled 2019-04-04 (×3): qty 1
  Filled 2019-04-04: qty 2
  Filled 2019-04-04 (×6): qty 1

## 2019-04-04 NOTE — BHH Suicide Risk Assessment (Signed)
[place] INPATIENT:  Family/Significant Other Suicide Prevention Education  Suicide Prevention Education:  Education Completed; [name], [name], has been identified by the patient as the family member/significant other with whom the patient will be residing, and identified as the person(s) who will aid the patient in the event of a mental health crisis (suicidal ideations/suicide attempt).  With written consent from the patient, the family member/significant other has been provided the following suicide prevention education, prior to the and/or following the discharge of the patient.  The suicide prevention education provided includes the following:  Suicide risk factors  Suicide prevention and interventions  National Suicide Hotline telephone number  [facility] assessment telephone number  [facility] Emergency Assistance Equality and/or Residential Mobile Crisis Unit telephone number  Request made of family/significant other to:  Remove weapons (e.g., guns, rifles, knives), all items previously/currently identified as safety concern.    Remove drugs/medications (over-the-counter, prescriptions, illicit drugs), all items previously/currently identified as a safety concern.  The family member/significant other verbalizes understanding of the suicide prevention education information provided.  The family member/significant other agrees to remove the items of safety concern listed above.   [name] contacted CSW back. [name] stated that she does not have any concerns or questions but she does feel that the patient needs some kind of adult day program or group. She stated that the patient has been talking about needing some friends and he used to go to [employer] which helped a long with him working. [name] stated that he does not work and with the recent death of his father he has been depressed and wanting/needing friends.   Trecia Rogers 04/04/2019, 3:54 PM

## 2019-04-04 NOTE — Progress Notes (Signed)
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  He states he has voices "inside" his head.  He reports that he is depressed particularly since his father's death.  Patient's night was uneventful he is now alert oriented to person place situation time but not exact date, he makes good eye contact speech is a bit of a monotone and he displays some poverty of content when we try engage him in more detail cognitive therapy but overall is stable in mood and affect and denies wanted to harm himself or harm his mother.  He believes the medications are helpful and he reports continued internal thoughts and ruminations but these do not hallucinations.  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 ADD (attention deficit disorder)   . Anxiety   . Depression   . Environmental allergies   . Schizo-affective psychosis (HCC)    History reviewed. No pertinent surgical history. Family History:  Family History  Problem Relation Age of Onset  . Hypertension Mother   . Hypertension Father   . Mental illness Neg Hx    Family Psychiatric  History: see eval Social History:  Social History   Substance and Sexual Activity  Alcohol Use Yes   . Alcohol/week: 1.0 standard drinks  . Types: 1 Standard drinks or equivalent per week   Comment: "once in a blue moon"     Social History   Substance and Sexual Activity  Drug Use Yes  . Types: Other-see comments, Marijuana   Comment: oil smoked in pipe calls "DAB", friends buy for him    Social History   Socioeconomic History  . Marital status: Single    Spouse name: Not on file  . Number of children: Not on file  . Years of education: Not on file  . Highest education level: Not on file  Occupational History  . Not on file  Social Needs  . Financial resource strain: Not on file  . Food insecurity    Worry: Not on file    Inability: Not on file  . Transportation needs    Medical: Not on file    Non-medical: Not on file  Tobacco Use  . Smoking status: Current Every Day Smoker    Packs/day: 2.00    Start date: 02/05/2019  . Smokeless tobacco: Never Used  Substance and Sexual Activity  . Alcohol use: Yes    Alcohol/week: 1.0 standard drinks    Types: 1 Standard drinks or equivalent per week    Comment: "once in a blue moon"  . Drug use: Yes    Types: Other-see comments, Marijuana    Comment: oil smoked in pipe calls "DAB", friends buy for him  . Sexual activity: Yes    Birth control/protection: Condom  Lifestyle  . Physical activity  Days per week: Not on file    Minutes per session: Not on file  . Stress: Not on file  Relationships  . Social Herbalist on phone: Not on file    Gets together: Not on file    Attends religious service: Not on file    Active member of club or organization: Not on file    Attends meetings of clubs or organizations: Not on file    Relationship status: Not on file  Other Topics Concern  . Not on file  Social History Narrative   Graduated from high school at age 27. He took IEP classes until he was in 10th grade. In 11th and 12th grade, he took normal classes and says he didn't need any extra help. He did 2 months of  college at [employer], and this was difficult for him. He dropped out of college, due to difficulty balancing work and school.    Additional Social History:                         Sleep: Good  Appetite:  Good  Current Medications: Current Facility-Administered Medications  Medication Dose Route Frequency Provider Last Rate Last Dose  . acetaminophen (TYLENOL) tablet 650 mg  650 mg Oral Q6H PRN Johnn Hai, MD      . alum & mag hydroxide-simeth (MAALOX/MYLANTA) 200-200-20 MG/5ML suspension 30 mL  30 mL Oral Q4H PRN Johnn Hai, MD      . haloperidol (HALDOL) tablet 5 mg  5 mg Oral Q6H PRN Johnn Hai, MD       And  . benztropine (COGENTIN) tablet 1 mg  1 mg Oral Q6H PRN Johnn Hai, MD      . benztropine (COGENTIN) tablet 1 mg  1 mg Oral BID Johnn Hai, MD   1 mg at 04/03/19 1701  . haloperidol (HALDOL) tablet 10 mg  10 mg Oral TID Johnn Hai, MD   10 mg at 04/03/19 1700  . magnesium hydroxide (MILK OF MAGNESIA) suspension 30 mL  30 mL Oral Daily PRN Johnn Hai, MD      . propranolol (INDERAL) tablet 20 mg  20 mg Oral BID Johnn Hai, MD   20 mg at 04/03/19 1701  . temazepam (RESTORIL) capsule 30 mg  30 mg Oral QHS Johnn Hai, MD   30 mg at 04/02/19 2104    Lab Results: No results found for this or any previous visit (from the past 13 hour(s)).  Blood Alcohol level:  Lab Results  Component Value Date   ETH <10 10/12/2018   ETH <10 78/29/5621    Metabolic Disorder Labs: Lab Results  Component Value Date   HGBA1C 5.9 (H) 10/15/2018   MPG 122.63 10/15/2018   MPG 102.54 05/10/2017   Lab Results  Component Value Date   PROLACTIN 3.4 (L) 10/15/2018   PROLACTIN 6.5 05/10/2017   Lab Results  Component Value Date   CHOL 138 10/15/2018   TRIG 46 10/15/2018   HDL 29 (L) 10/15/2018   CHOLHDL 4.8 10/15/2018   VLDL 9 10/15/2018   LDLCALC 100 (H) 10/15/2018   LDLCALC 79 05/10/2017    Physical Findings: AIMS: Facial and Oral Movements Muscles of Facial  Expression: None, normal Lips and Perioral Area: None, normal Jaw: None, normal Tongue: None, normal,Extremity Movements Upper (arms, wrists, hands, fingers): None, normal Lower (legs, knees, ankles, toes): None, normal, Trunk Movements Neck, shoulders, hips: None, normal, Overall Severity Severity of abnormal  movements (highest score from questions above): None, normal Incapacitation due to abnormal movements: None, normal Patient's awareness of abnormal movements (rate only patient's report): No Awareness, Dental Status Current problems with teeth and/or dentures?: No Does patient usually wear dentures?: No  CIWA:    COWS:     Musculoskeletal: Strength & Muscle Tone: within normal limits Gait & Station: normal Patient leans: N/A  Psychiatric Specialty Exam: Physical Exam  ROS  Blood pressure 139/75, pulse (!) 102, temperature 97.8 F (36.6 C), temperature source Oral, resp. rate 16, height 6' 2.25" (1.886 m), weight 117.5 kg, SpO2 98 %.Body mass index is 33.03 kg/m.  General Appearance: Casual  Eye Contact:  Fair  Speech:  Slow  Volume:  Decreased  Mood:  Dysphoric and Passive  Affect:  Blunt and Congruent  Thought Process:  Linear and Descriptions of Associations: Circumstantial  Orientation:  Full (Time, Place, and Person)  Thought Content:  Logical  Suicidal Thoughts:  No  Homicidal Thoughts:  No  Memory:  Immediate;   Fair  Judgement:  Fair  Insight:  Fair  Psychomotor Activity:  Normal  Concentration:  Concentration: Fair  Recall:  [employer] of Knowledge:  Fair  Language:  Fair  Akathisia:  Negative  Handed:  Right  AIMS (if indicated):     Assets:  Leisure Time Physical Health  ADL's:  Intact  Cognition:  WNL  Sleep:  Number of Hours: 6.25     Treatment Plan Summary: Daily contact with patient to assess and evaluate symptoms and progress in treatment and Medication management  Continue current cognitive and reality-based therapies continue  antipsychotic measures and monitor for safety basic risk benefits side effects discussed continue 15-minute precautions probable discharge by Monday  Malvin Johns, MD 04/04/2019, 8:17 AM

## 2019-04-04 NOTE — Progress Notes (Signed)
Pt continued to complain of jaw stiffness pt was given his PO Cogentin and later his 30 mg Restoril. Pt continued to express jaw stiffness. Pt encouraged to give the medication a chance to work. Pt presently in no acute distress. NP Corene Cornea notified and 1 mg Cogentin IM ordered.

## 2019-04-04 NOTE — Tx Team (Signed)
Interdisciplinary Treatment and Diagnostic Plan Update  04/04/2019 Time of Session: 10:05am Marvin Crawford MRN: [id number]  Principal Diagnosis: <principal problem not specified>  Secondary Diagnoses: Active Problems:   Cannabis use disorder, moderate, dependence (HCC)   Schizoaffective disorder (HCC)   Current Medications:  Current Facility-Administered Medications  Medication Dose Route Frequency Provider Last Rate Last Dose  . acetaminophen (TYLENOL) tablet 650 mg  650 mg Oral Q6H PRN Malvin JohnsFarah, Brian, MD      . alum & mag hydroxide-simeth (MAALOX/MYLANTA) 200-200-20 MG/5ML suspension 30 mL  30 mL Oral Q4H PRN Malvin JohnsFarah, Brian, MD      . haloperidol (HALDOL) tablet 5 mg  5 mg Oral Q6H PRN Malvin JohnsFarah, Brian, MD       And  . benztropine (COGENTIN) tablet 1 mg  1 mg Oral Q6H PRN Malvin JohnsFarah, Brian, MD      . benztropine (COGENTIN) tablet 1 mg  1 mg Oral BID Malvin JohnsFarah, Brian, MD   1 mg at 04/04/19 [id number]  . haloperidol (HALDOL) tablet 10 mg  10 mg Oral TID Malvin JohnsFarah, Brian, MD   10 mg at 04/04/19 [id number]  . magnesium hydroxide (MILK OF MAGNESIA) suspension 30 mL  30 mL Oral Daily PRN Malvin JohnsFarah, Brian, MD      . propranolol (INDERAL) tablet 20 mg  20 mg Oral BID Malvin JohnsFarah, Brian, MD   20 mg at 04/04/19 0820  . temazepam (RESTORIL) capsule 30 mg  30 mg Oral QHS Malvin JohnsFarah, Brian, MD   30 mg at 04/02/19 2104   PTA Medications: No medications prior to admission.    Patient Stressors:    Patient Strengths:    Treatment Modalities: Medication Management, Group therapy, Case management,  1 to 1 session with clinician, Psychoeducation, Recreational therapy.   Physician Treatment Plan for Primary Diagnosis: <principal problem not specified> Long Term Goal(s): Improvement in symptoms so as ready for discharge Improvement in symptoms so as ready for discharge   Short Term Goals: Ability to identify changes in lifestyle to reduce recurrence of condition will improve Ability to verbalize feelings will improve Ability to disclose  and discuss suicidal ideas Ability to demonstrate self-control will improve Ability to identify and develop effective coping behaviors will improve Ability to maintain clinical measurements within normal limits will improve Compliance with prescribed medications will improve Ability to identify triggers associated with substance abuse/mental health issues will improve Ability to identify changes in lifestyle to reduce recurrence of condition will improve Ability to verbalize feelings will improve Ability to disclose and discuss suicidal ideas Ability to demonstrate self-control will improve Ability to identify and develop effective coping behaviors will improve Ability to maintain clinical measurements within normal limits will improve Compliance with prescribed medications will improve Ability to identify triggers associated with substance abuse/mental health issues will improve  Medication Management: Evaluate patient's response, side effects, and tolerance of medication regimen.  Therapeutic Interventions: 1 to 1 sessions, Unit Group sessions and Medication administration.  Evaluation of Outcomes: Progressing  Physician Treatment Plan for Secondary Diagnosis: Active Problems:   Cannabis use disorder, moderate, dependence (HCC)   Schizoaffective disorder (HCC)  Long Term Goal(s): Improvement in symptoms so as ready for discharge Improvement in symptoms so as ready for discharge   Short Term Goals: Ability to identify changes in lifestyle to reduce recurrence of condition will improve Ability to verbalize feelings will improve Ability to disclose and discuss suicidal ideas Ability to demonstrate self-control will improve Ability to identify and develop effective coping behaviors will improve Ability to maintain clinical  measurements within normal limits will improve Compliance with prescribed medications will improve Ability to identify triggers associated with substance  abuse/mental health issues will improve Ability to identify changes in lifestyle to reduce recurrence of condition will improve Ability to verbalize feelings will improve Ability to disclose and discuss suicidal ideas Ability to demonstrate self-control will improve Ability to identify and develop effective coping behaviors will improve Ability to maintain clinical measurements within normal limits will improve Compliance with prescribed medications will improve Ability to identify triggers associated with substance abuse/mental health issues will improve     Medication Management: Evaluate patient's response, side effects, and tolerance of medication regimen.  Therapeutic Interventions: 1 to 1 sessions, Unit Group sessions and Medication administration.  Evaluation of Outcomes: Progressing   RN Treatment Plan for Primary Diagnosis: <principal problem not specified> Long Term Goal(s): Knowledge of disease and therapeutic regimen to maintain health will improve  Short Term Goals: Ability to participate in decision making will improve, Ability to verbalize feelings will improve, Ability to disclose and discuss suicidal ideas, Ability to identify and develop effective coping behaviors will improve and Compliance with prescribed medications will improve  Medication Management: RN will administer medications as ordered by provider, will assess and evaluate patient's response and provide education to patient for prescribed medication. RN will report any adverse and/or side effects to prescribing provider.  Therapeutic Interventions: 1 on 1 counseling sessions, Psychoeducation, Medication administration, Evaluate responses to treatment, Monitor vital signs and CBGs as ordered, Perform/monitor CIWA, COWS, AIMS and Fall Risk screenings as ordered, Perform wound care treatments as ordered.  Evaluation of Outcomes: Progressing   LCSW Treatment Plan for Primary Diagnosis: <principal problem not  specified> Long Term Goal(s): Safe transition to appropriate next level of care at discharge, Engage patient in therapeutic group addressing interpersonal concerns.  Short Term Goals: Engage patient in aftercare planning with referrals and resources and Increase skills for wellness and recovery  Therapeutic Interventions: Assess for all discharge needs, 1 to 1 time with Social worker, Explore available resources and support systems, Assess for adequacy in community support network, Educate family and significant other(s) on suicide prevention, Complete Psychosocial Assessment, Interpersonal group therapy.  Evaluation of Outcomes: Progressing   Progress in Treatment: Attending groups: No. Participating in groups: No. Taking medication as prescribed: Yes. Toleration medication: Yes. Family/Significant other contact made: No, will contact:  will contact if given consent to contact Patient understands diagnosis: No. Discussing patient identified problems/goals with staff: Yes. Medical problems stabilized or resolved: Yes. Denies suicidal/homicidal ideation: Yes. Issues/concerns per patient self-inventory: No. Other:   New problem(s) identified: No, Describe:  None  New Short Term/Long Term Goal(s): Medication stabilization, elimination of SI thoughts, and development of a comprehensive mental wellness plan.   Patient Goals:  "Make friends when I get out of the hospital".  Discharge Plan or Barriers: CSW will continue to follow up for appropriate referrals and possible discharge planning  Reason for Continuation of Hospitalization: Hallucinations Medication stabilization  Estimated Length of Stay: 2-3 days   Attendees: Patient: Marvin Crawford  04/04/2019   Physician: Dr. Johnn Hai, MD 04/04/2019   Nursing: Mary Sella, RN 04/04/2019   RN Care Manager: 04/04/2019  Social Worker: Ardelle Anton, [place] 04/04/2019   Recreational Therapist:  04/04/2019   Other: Intern: Ovidio Kin, MSW intern  04/04/2019  Other:  04/04/2019   Other: 04/04/2019      Scribe for Treatment Team: Trecia Rogers, LCSW 04/04/2019 11:01 AM

## 2019-04-04 NOTE — Progress Notes (Signed)
Recreation Therapy Notes  Date: 8.28.20 Time: 1000 Location: [address] Dayroom  Group Topic: Communication  Goal Area(s) Addresses:  Patient will effectively communicate with peers in group.  Patient will verbalize benefit of healthy communication. Patient will verbalize positive effect of healthy communication on post d/c goals.  Patient will identify communication techniques that made activity effective for group.   Intervention:  Paper, pencils, geometrical shape pictures   Activity: Back to Back Drawings.  Due to COVID-19 restrictions, this activity has been modified.  One person from the group was given a picture to describe to the remaining group members.  The remaining patients were to draw the picture as it was described to them.  The group could not as the speaker any detailed questions.  The only question the group could ask of the speaker was, "could you repeat that".  The speaker could describe the picture in as much detail as possible.    Education: Communication, Discharge Planning  Education Outcome: Acknowledges understanding/In group clarification offered/Needs additional education.   Clinical Observations/Feedback:  Pt did not attend group session.     [name], LRT/CTRS         Ria Comment, Talibah Colasurdo A 04/04/2019 11:22 AM

## 2019-04-04 NOTE — [facility] Group Notes (Signed)
[facility] LCSW Group Therapy Note  Date/Time: 04/04/2019 @ 11:30am  Type of Therapy/Topic:  Group Therapy:  Feelings about Diagnosis  Participation Level:  Did Not Attend   Mood: Did not attend    Description of Group:    This group will allow patients to explore their thoughts and feelings about diagnoses they have received. Patients will be guided to explore their level of understanding and acceptance of these diagnoses. Facilitator will encourage patients to process their thoughts and feelings about the reactions of others to their diagnosis, and will guide patients in identifying ways to discuss their diagnosis with significant others in their lives. This group will be process-oriented, with patients participating in exploration of their own experiences as well as giving and receiving support and challenge from other group members.   Therapeutic Goals: 1. Patient will demonstrate understanding of diagnosis as evidence by identifying two or more symptoms of the disorder:  2. Patient will be able to express two feelings regarding the diagnosis 3. Patient will demonstrate ability to communicate their needs through discussion and/or role plays  Summary of Patient Progress:    Patient did not attend group.     Therapeutic Modalities:   Cognitive Behavioral Therapy Brief Therapy Feelings Identification   Lucilla Petrenko, LCSW 

## 2019-04-04 NOTE — Progress Notes (Signed)
Pt c/o stiffness in his jaw. MD notified and received order to give Benadryl 50mg  IM. Medication given as ordered. Pt is being monitored. Safety maintained.

## 2019-04-04 NOTE — BHH Counselor (Signed)
Adult Comprehensive Assessment  Patient ID: Marvin Crawford, male   DOB: 1995-09-13, 23 y.o.   MRN: [id number]  Information Source: Information source: Patient  Current Stressors:  Patient states their primary concerns and needs for treatment are:: "Depression" Patient states their goals for this hospitilization and ongoing recovery are:: "To take the right medicine" Educational / Learning stressors: Pt denies stressors Employment / Job issues: Pt is currently unemployed but is looking for a job. Family Relationships: Pt denies stressors. Financial / Lack of resources (include bankruptcy): Pt denies stressors. Housing / Lack of housing: Pt denies stressors. Physical health (include injuries & life threatening diseases): Pt denies stressors. Social relationships: Pt reports struggling to make friends. Substance abuse: Pt denies substance use. Bereavement / Loss: Pt reports that his father passed in May 2020.  Living/Environment/Situation:  Living Arrangements: Parent, Other relatives Living conditions (as described by patient or guardian): "Good" Who else lives in the home?: Mom and brother How long has patient lived in current situation?: "Most of my life" What is atmosphere in current home: Comfortable, Loving, Supportive  Family History:  Marital status: Single Are you sexually active?: No What is your sexual orientation?: Gay Has your sexual activity been affected by drugs, alcohol, medication, or emotional stress?: No Does patient have children?: No  Childhood History:  By whom was/is the patient raised?: Mother Additional childhood history information: Pt reports that his father was living somewhere else. Description of patient's relationship with caregiver when they were a child: "It wasn't that good but it was good" Patient's description of current relationship with people who raised him/her: "Good still" How were you disciplined when you got in trouble as a  child/adolescent?: Whooping, time out out, and taken away Does patient have siblings?: Yes Number of Siblings: 2(brother and sister) Description of patient's current relationship with siblings: "Good" Did patient suffer any verbal/emotional/physical/sexual abuse as a child?: No Did patient suffer from severe childhood neglect?: No Has patient ever been sexually abused/assaulted/raped as an adolescent or adult?: No Was the patient ever a victim of a crime or a disaster?: No Witnessed domestic violence?: No Has patient been effected by domestic violence as an adult?: No  Education:  Highest grade of school patient has completed: 12th graduated Currently a student?: No Learning disability?: Yes What learning problems does patient have?: IEP plan through the school system.  Employment/Work Situation:   Employment situation: Unemployed Patient's job has been impacted by current illness: No What is the longest time patient has a held a job?: 8 years Where was the patient employed at that time?: [employer] Did You Receive Any Psychiatric Treatment/Services While in the [employer]?: No Are There Guns or Other Weapons in Your Home?: No  Financial Resources:   Surveyor, quantityinancial resources: Support from parents / caregiver, Medicaid Does patient have a Lawyerrepresentative payee or guardian?: No  Alcohol/Substance Abuse:   What has been your use of drugs/alcohol within the last 12 months?: Pt denies substance use If attempted suicide, did drugs/alcohol play a role in this?: No Alcohol/Substance Abuse Treatment Hx: Denies past history Has alcohol/substance abuse ever caused legal problems?: No  Social Support System:   Patient's Community Support System: Good Describe Community Support System: Mom Type of faith/religion: Christian How does patient's faith help to cope with current illness?: "To stay focus on working"  Leisure/Recreation:   Leisure and Hobbies: Gardening and listening to  music  Strengths/Needs:   What is the patient's perception of their strengths?: Working Patient states they  can use these personal strengths during their treatment to contribute to their recovery: "Stop worry about friends and start worrying about work" Patient states these barriers may affect/interfere with their treatment: "I don't have any friends" Patient states these barriers may affect their return to the community: N/A Other important information patient would like considered in planning for their treatment: N/A  Discharge Plan:   Currently receiving community mental health services: Yes (From [facility]om[facility] Patient states concerns and preferences for aftercare planning are: [facility] [employer] Patient states they will know when they are safe and ready for discharge when: "Monday. Because I can talk to my mom and family" Does patient have access to transportation?: Yes(grandma) Does patient have financial barriers related to discharge medications?: No Will patient be returning to same living situation after discharge?: Yes(mom and brother)  Summary/Recommendations:   Summary and Recommendations (to be completed by the evaluator): Pt is a 23 year old male with a past history of schizoaffective disorder, psychosis, depression, anxiety, several suicide attempts, and several prior [facility] hospitalizations, who presented voluntarily to the behavioral health ED on 08/24 for "feeling depressed". Recommendations for pt include: crisis stabilization, medication management, therapeutic mileu, attend and participate in group therapy, and development of a comprehensive mental wellness plan.  Trecia Rogers. 04/04/2019

## 2019-04-04 NOTE — Progress Notes (Signed)
D:Pt is currently attending group. Pt rates anxiety and depression as a 5 on 0-10 scale with 10 being the most. Pt has limited interaction and is cooperative. Pt is taking medications as ordered. A:Offered support, encouragement and 15 minute checks.  R:Safety maintained on the unit.

## 2019-04-04 NOTE — BHH Suicide Risk Assessment (Signed)
[facility] INPATIENT:  Family/Significant Other Suicide Prevention Education  Suicide Prevention Education:  Contact Attempts: [name], Marvin Crawford,  has been identified by the patient as the family member/significant other with whom the patient will be residing, and identified as the person(s) who will aid the patient in the event of a mental health crisis.  With written consent from the patient, two attempts were made to provide suicide prevention education, prior to and/or following the patient's discharge.  We were unsuccessful in providing suicide prevention education.  A suicide education pamphlet was given to the patient to share with family/significant other.  Date and time of first attempt: 04/04/2019 @ 3:10pm Date and time of second attempt:  Marvin Crawford 04/04/2019, 3:13 PM

## 2019-04-05 ENCOUNTER — Encounter (HOSPITAL_COMMUNITY): Payer: Self-pay | Admitting: Emergency Medicine

## 2019-04-05 ENCOUNTER — Other Ambulatory Visit: Payer: Self-pay

## 2019-04-05 ENCOUNTER — Inpatient Hospital Stay (HOSPITAL_COMMUNITY): Payer: Medicaid Other

## 2019-04-05 DIAGNOSIS — F251 Schizoaffective disorder, depressive type: Secondary | ICD-10-CM

## 2019-04-05 MED ORDER — FENTANYL CITRATE (PF) 100 MCG/2ML IJ SOLN
100.0000 ug | Freq: Once | INTRAMUSCULAR | Status: AC
  Administered 2019-04-05: 12:00:00 100 ug via INTRAVENOUS
  Filled 2019-04-05: qty 2

## 2019-04-05 NOTE — Progress Notes (Signed)
Pt continued to express lock jaw pain, pt stated it was better. Pt stated he got his wisdom teeth pulled last month.

## 2019-04-05 NOTE — ED Triage Notes (Signed)
Opened chart to answer question for Dr Tyrone Nine

## 2019-04-05 NOTE — Discharge Instructions (Addendum)
?

## 2019-04-05 NOTE — Progress Notes (Signed)
Adult Psychoeducational Group Note  Date:  04/05/2019 Time:  12:37 AM  Group Topic/Focus:  Wrap-Up Group:   The focus of this group is to help patients review their daily goal of treatment and discuss progress on daily workbooks.  Participation Level:  Minimal  Participation Quality:  Appropriate  Affect:  Appropriate  Cognitive:  Lacking  Insight: Limited  Engagement in Group:  Limited  Modes of Intervention:  Discussion  Additional Comments: Pt stated his goal for today was to focus on his treatment plan. Pt stated he felt he accomplished his goal today. Pt stated his relationship with his family has improved since he was admitted here. Pt stated he felt better about himself today. Pt rated her overall day 5 out of 10. Pt stated his appetite was pretty good today. Pt stated his goal for tonight was to get some rest. Pt did not complain of any pain tonight. Pt stated he would alert staff if anything changes.   Candy Sledge 04/05/2019, 12:37 AM

## 2019-04-05 NOTE — Progress Notes (Signed)
Patient ID: Marvin Crawford, male   DOB: [birth year], 23 y.o.   MRN: [id number]   Pt back [facility] unit at [facility], alert, oriented, and ambulatory. Pt sitting on his bed with no complaints of pain. Q15 min. safety checks in place. Will continue to monitor.

## 2019-04-05 NOTE — ED Triage Notes (Signed)
Pt sent from [facility] via [facility] for dislocated jaw. Pt reports happened yesterday.

## 2019-04-05 NOTE — Progress Notes (Signed)
Patient ID: Marvin Crawford, male   DOB: 11/10/1995, 23 y.o.   MRN: [id number]   Per MD, pt to be transported to [facility] ED for evaluation/consult. Gennette Pac RN notified and given report. Transport called.

## 2019-04-05 NOTE — BHH Group Notes (Signed)
[facility] Group Notes:  (Nursing/MHT/Case Management/Adjunct)  Date:  04/05/2019  Time:  0915 am  Type of Therapy:  Nurse Education  Participation Level:  Did not attend (pt was off the unit for a CT scan) Cherl Gorney L 04/05/2019,

## 2019-04-05 NOTE — ED Notes (Signed)
Pt was given some applesauce after EDP reset pts jaw, pt had no c/o pain or difficulty swallowing.
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
## 2019-04-05 NOTE — ED Provider Notes (Signed)
[facility]-EMERGENCY DEPT Provider Note   CSN: [id number] Arrival date & time: 04/05/19  1117     History   Chief Complaint Chief Complaint  Patient presents with   jaw dislocation    HPI Marvin Crawford is a 23 y.o. male.     23 yo M with a chief complaints of jaw pain and swelling.  Going on since yesterday.  Patient states that he yawns very large yesterday and felt some pain to his jaw.  Has unfortunately been in psychiatric care for suicidality.  Today was complaining of jaw pain and then noticed that he was unable to close his mouth and so ordered a CT scan.  Sure that he had a jaw dislocation he was sent here.  No prior history of the same.  The history is provided by the patient.  Illness Severity:  Moderate Onset quality:  Gradual Duration:  2 days Timing:  Constant Progression:  Worsening Chronicity:  New Associated symptoms: no abdominal pain, no chest pain, no congestion, no diarrhea, no fever, no headaches, no myalgias, no rash, no shortness of breath and no vomiting     Past Medical History:  Diagnosis Date   ADD (attention deficit disorder)    Anxiety    Depression    Environmental allergies    Schizo-affective psychosis (HCC)     Patient Active Problem List   Diagnosis Date Noted   Sepsis (HCC) 10/18/2018   Intractable nausea and vomiting 10/18/2018   Hypokalemia 10/18/2018   Schizophrenia, acute (HCC) 10/14/2018   Schizoaffective disorder (HCC) 07/06/2017   Major depressive disorder, recurrent, severe with psychotic features (HCC) 07/05/2017   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 No pertinent surgical history.      Home Medications    Prior to Admission medications   Not on File    Family  History Family History  Problem Relation Age of Onset   Hypertension Mother    Hypertension Father    Mental illness Neg Hx     Social History Social History   Tobacco Use   Smoking status: Current Every Day Smoker    Packs/day: 2.00    Start date: 02/05/2019   Smokeless tobacco: Never Used  Substance Use Topics   Alcohol use: Yes    Alcohol/week: 1.0 standard drinks    Types: 1 Standard drinks or equivalent per week    Comment: "once in a blue moon"   Drug use: Yes    Types: Other-see comments, Marijuana    Comment: oil smoked in pipe calls "DAB", friends buy for him     Allergies   Patient has no known allergies.   Review of Systems Review of Systems  Constitutional: Negative for chills and fever.  HENT: Positive for facial swelling. Negative for congestion.   Eyes: Negative for discharge and visual disturbance.  Respiratory: Negative for shortness of breath.   Cardiovascular: Negative for chest pain and palpitations.  Gastrointestinal: Negative for abdominal pain, diarrhea and vomiting.  Musculoskeletal: Negative for arthralgias and myalgias.  Skin: Negative for color change and rash.  Neurological: Negative for tremors, syncope and headaches.  Psychiatric/Behavioral: Negative for confusion and dysphoric mood.     Physical Exam Updated Vital Signs BP (!) 158/99    Pulse (!) 53    Temp 98.4 F (36.9 C)  Resp 17    Ht 6' 2.25" (1.886 m)    Wt 117.5 kg    SpO2 95%    BMI 33.03 kg/m   Physical Exam Vitals signs and nursing note reviewed.  Constitutional:      Appearance: He is well-developed.  HENT:     Head: Normocephalic and atraumatic.     Comments: Patient's jaws locked in an open position.  He is unable to close his mouth.  Tolerating secretions without difficulty.  Some mild pain to the bilateral TMJ areas. Eyes:     Pupils: Pupils are equal, round, and reactive to light.  Neck:     Musculoskeletal: Normal range of motion and neck supple.      Vascular: No JVD.  Cardiovascular:     Rate and Rhythm: Normal rate and regular rhythm.     Heart sounds: No murmur. No friction rub. No gallop.   Pulmonary:     Effort: No respiratory distress.     Breath sounds: No wheezing.  Abdominal:     General: There is no distension.     Tenderness: There is no guarding or rebound.  Musculoskeletal: Normal range of motion.  Skin:    Coloration: Skin is not pale.     Findings: No rash.  Neurological:     Mental Status: He is alert and oriented to person, place, and time.  Psychiatric:        Behavior: Behavior normal.      ED Treatments / Results  Labs (all labs ordered are listed, but only abnormal results are displayed) Labs Reviewed - No data to display  EKG None  Radiology Ct Maxillofacial Wo Contrast  Result Date: 04/05/2019 CLINICAL DATA:  Dentofacial anomalies. Wisdom teeth removed approximately 1 month ago. Mandibular swelling and locking. EXAM: CT MAXILLOFACIAL WITHOUT CONTRAST TECHNIQUE: Multidetector CT imaging of the maxillofacial structures was performed. Multiplanar CT image reconstructions were also generated. COMPARISON:  None. FINDINGS: Osseous: Both mandibular condyles are located completely the anterior to the temporal fossa. Bone defects related 2 bilateral wisdom teeth removal. No specific complicating feature related to those. The patient does have lucency around the roots tooth 19 and a lesser extent tooth 20. Small periapical abscesses at tooth 19. No other facial region bone finding. Orbits: Normal Sinuses: Left maxillary sinusitis.  Other sinuses are clear. Soft tissues: No significant soft tissue finding. No evidence of facial abscess. Limited intracranial: Normal IMPRESSION: Complete anterior dislocation of the mandibular condyles relative to their respective temporal fossa. Recent wisdom tooth extraction without unexpected finding in those regions. 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  Left maxillary sinusitis per Electronically Signed   By: Paulina FusiMark  Shogry M.D.   On: 04/05/2019 10:02    Procedures Reduction of dislocation  Date/Time: 04/05/2019 12:05 PM Performed by: Melene PlanFloyd, Nayan Proch, DO Authorized by: Melene PlanFloyd, Lakishia Bourassa, DO  Consent: Verbal consent obtained. Risks and benefits: risks, benefits and alternatives were discussed Consent given by: patient Patient understanding: patient states understanding of the procedure being performed Imaging studies: imaging studies available Required items: required blood products, implants, devices, and special equipment available Time out: Immediately prior to procedure a "time out" was called to verify the correct patient, procedure, equipment, support staff and site/side marked as required. Local anesthesia used: no  Anesthesia: Local anesthesia used: no  Sedation: Patient sedated: no  Patient tolerance: patient tolerated the procedure well with no immediate complications    (including critical care time)  Medications Ordered in ED  Medications  acetaminophen (TYLENOL) tablet 650 mg (650 mg Oral Given 04/05/19 1015)  alum & mag hydroxide-simeth (MAALOX/MYLANTA) 200-200-20 MG/5ML suspension 30 mL (has no administration in time range)  magnesium hydroxide (MILK OF MAGNESIA) suspension 30 mL (has no administration in time range)  haloperidol (HALDOL) tablet 5 mg (has no administration in time range)    And  benztropine (COGENTIN) tablet 1 mg (1 mg Oral Given 04/05/19 0228)  haloperidol (HALDOL) tablet 10 mg (10 mg Oral Given 04/05/19 0754)  temazepam (RESTORIL) capsule 30 mg (30 mg Oral Given 04/04/19 2018)  propranolol (INDERAL) tablet 20 mg (20 mg Oral Given 04/05/19 0755)  diphenhydrAMINE (BENADRYL) injection 50 mg (50 mg Intramuscular Not Given 04/04/19 1937)  benztropine (COGENTIN) tablet 2 mg (2 mg Oral Given 04/05/19 0756)  diphenhydrAMINE (BENADRYL) 50 MG/ML injection (50 mg  Given 04/04/19 1915)  benztropine mesylate (COGENTIN)  injection 1 mg (1 mg Intramuscular Given 04/04/19 2100)  fentaNYL (SUBLIMAZE) injection 100 mcg (100 mcg Intravenous Given 04/05/19 1153)     Initial Impression / Assessment and Plan / ED Course  I have reviewed the triage vital signs and the nursing notes.  Pertinent labs & imaging results that were available during my care of the patient were reviewed by me and considered in my medical decision making (see chart for details).        23 yo M with a chief complaints of jaw pain.  Had a very large yawning incident yesterday and felt pain afterwards.  Unable to close his mouth since.  CT scan performed and shows dislocation.  Reduced at bedside with external maneuver.  No audible click but now with improved ROM of the jaw and diminished pain.    Able to tolerate PO.   12:24 PM:  I have discussed the diagnosis/risks/treatment options with the patient and believe the pt to be eligible for discharge home to follow-up with PCP. We also discussed returning to the ED immediately if new or worsening sx occur. We discussed the sx which are most concerning (e.g., sudden worsening pain, fever, inability to tolerate by mouth) that necessitate immediate return. Medications administered to the patient during their visit and any new prescriptions provided to the patient are listed below.  Medications given during this visit Medications  acetaminophen (TYLENOL) tablet 650 mg (650 mg Oral Given 04/05/19 1015)  alum & mag hydroxide-simeth (MAALOX/MYLANTA) 200-200-20 MG/5ML suspension 30 mL (has no administration in time range)  magnesium hydroxide (MILK OF MAGNESIA) suspension 30 mL (has no administration in time range)  haloperidol (HALDOL) tablet 5 mg (has no administration in time range)    And  benztropine (COGENTIN) tablet 1 mg (1 mg Oral Given 04/05/19 0228)  haloperidol (HALDOL) tablet 10 mg (10 mg Oral Given 04/05/19 0754)  temazepam (RESTORIL) capsule 30 mg (30 mg Oral Given 04/04/19 2018)    propranolol (INDERAL) tablet 20 mg (20 mg Oral Given 04/05/19 0755)  diphenhydrAMINE (BENADRYL) injection 50 mg (50 mg Intramuscular Not Given 04/04/19 1937)  benztropine (COGENTIN) tablet 2 mg (2 mg Oral Given 04/05/19 0756)  diphenhydrAMINE (BENADRYL) 50 MG/ML injection (50 mg  Given 04/04/19 1915)  benztropine mesylate (COGENTIN) injection 1 mg (1 mg Intramuscular Given 04/04/19 2100)  fentaNYL (SUBLIMAZE) injection 100 mcg (100 mcg Intravenous Given 04/05/19 1153)     The patient appears reasonably screen and/or stabilized for discharge and I doubt any other medical condition or other [facility] requiring further screening, evaluation, or treatment in the ED at this time prior to discharge.  Final Clinical Impressions(s) / ED Diagnoses   Final diagnoses:  Jaw dislocation, initial encounter    ED Discharge Orders    None       Melene PlanFloyd, Braidan Ricciardi, DO 04/05/19 1225

## 2019-04-05 NOTE — Progress Notes (Signed)
Pt focused and obsessed with medications , pt continued to endorse issues with r- side of his mouth, but stated the L- side was fine. Pt was given heat packs and medication per MAR.

## 2019-04-05 NOTE — Progress Notes (Signed)
Patient ID: ADONTE VANRIPER, male   DOB: 05/15/1996, 23 y.o.   MRN: [id number]   [place] NOVEL CORONAVIRUS (COVID-19) DAILY CHECK-OFF SYMPTOMS - answer yes or no to each - every day NO YES  Have you had a fever in the past 24 hours?  . Fever (Temp > 37.80C / 100F) X   Have you had any of these symptoms in the past 24 hours? . New Cough .  Sore Throat  .  Shortness of Breath .  Difficulty Breathing .  Unexplained Body Aches   X   Have you had any one of these symptoms in the past 24 hours not related to allergies?   . Runny Nose .  Nasal Congestion .  Sneezing   X   If you have had runny nose, nasal congestion, sneezing in the past 24 hours, has it worsened?  X   EXPOSURES - check yes or no X   Have you traveled outside the state in the past 14 days?  X   Have you been in contact with someone with a confirmed diagnosis of COVID-19 or PUI in the past 14 days without wearing appropriate PPE?  X   Have you been living in the same home as a person with confirmed diagnosis of COVID-19 or a PUI (household contact)?    X   Have you been diagnosed with COVID-19?    X              What to do next: Answered NO to all: Answered YES to anything:   Proceed with unit schedule Follow the [facility] Inpatient Flowsheet.

## 2019-04-05 NOTE — Progress Notes (Addendum)
Patient ID: Marvin Crawford, male   DOB: Feb 01, 1996, 23 y.o.   MRN: [id number]  D: Pt alert and oriented on the unit. Pt c/o jaw pain. RN assessed pt's mouth, gums and jaw. MD notified of swelling of jaw and pt sent to [facility] ED for CT scan. Pt is pleasant and cooperative.  A: Heat pack applied to pt's jaw. Education, support and encouragement provided, q15 minute safety checks remain in effect. Medications administered per MD orders.  R: No reactions/side effects to medicine noted. Pt verbally contracts for safety. Pt ambulating on the unit with no issues. Pt remains safe on and off the unit.

## 2019-04-06 MED ORDER — TRAZODONE HCL 100 MG PO TABS
100.0000 mg | ORAL_TABLET | Freq: Every day | ORAL | Status: DC
  Administered 2019-04-06: 21:00:00 100 mg via ORAL
  Filled 2019-04-06 (×4): qty 1

## 2019-04-06 NOTE — Progress Notes (Signed)
D:  Patient's self inventory sheet, patient sleeps good, sleep medication helpful.  Fair appetite, normal energy level, poor concentration.  Rated depression, hopeless and anxiety #1.  Denied withdrawals, checked runny nose.  SI, contracts for safety.  Denied SI while talking to nurse.  Denied physical problems.  Denied physical pain.  Does have discharge plans. A:  Medications administered per MD orders.  Emotional support and encouragement given patient. R:  Denied SI and HI while talking to nurse this morning.  Contracts for safety.  Denied A/V hallucinations.  Safety maintained with 15 minute checks.

## 2019-04-06 NOTE — Progress Notes (Signed)
Writer spoke with patient 1:1 and he reports feeling much better since having his jaw put back in place. He has been up in the dayroom watching tv and attended group. He was compliant with hs medication. Safety maintained on unit with 15 min checks.
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
## 2019-04-06 NOTE — Progress Notes (Signed)
Patient has been observed up in the dayroom watching tv with peers. Writer spoke with him 1:1 and he reports that his day was good and he is going home on tomorrow. Writer asked what would be the first thing he wants to eat once discharged and he replied he wants a burger. He has been polite and cooperative on the unit, compliant with medication. Safety maintained with 15 min checks.

## 2019-04-06 NOTE — Plan of Care (Signed)
Nurse discussed anxiety, depression, coping skills with patient. 

## 2019-04-06 NOTE — [facility] Group Notes (Signed)
[place] LCSW Group Therapy Note  Date/Time:  04/06/2019 9:00-10:00 or 10:00-11:00AM  Type of Therapy and Topic:  Group Therapy:  Healthy and Unhealthy Supports  Participation Level:  Minimal   Description of Group:  Patients in this group were introduced to the idea of adding a variety of healthy supports to address the various needs in their lives.Patients discussed what additional healthy supports could be helpful in their recovery and wellness after discharge in order to prevent future hospitalizations.   An emphasis was placed on using counselor, doctor, therapy groups, 12-step groups, and problem-specific support groups to expand supports.  They also worked as a group on developing a specific plan for several patients to deal with unhealthy supports through [place], psychoeducation with loved ones, and even termination of relationships.   Therapeutic Goals:   1)  discuss importance of adding supports to stay well once out of the hospital  2)  compare healthy versus unhealthy supports and identify some examples of each  3)  generate ideas and descriptions of healthy supports that can be added  4)  offer mutual support about how to address unhealthy supports  5)  encourage active participation in and adherence to discharge plan    Summary of Patient Progress:  The patient was in and out of the dayroom during group. He settled in and joined the group and described his mother as his healthy support.  Therapeutic Modalities:   Motivational Interviewing Brief Solution-Focused Therapy  Rolanda Jay

## 2019-04-07 MED ORDER — HALOPERIDOL 10 MG PO TABS: ORAL_TABLET | ORAL | 1 refills | Status: DC

## 2019-04-07 MED ORDER — BENZTROPINE MESYLATE 1 MG PO TABS: 1.0000 mg | ORAL_TABLET | Freq: Two times a day (BID) | ORAL | 2 refills | Status: DC

## 2019-04-07 MED ORDER — TRAZODONE HCL 100 MG PO TABS: 100.0000 mg | ORAL_TABLET | Freq: Every evening | ORAL | 1 refills | Status: DC | PRN

## 2019-04-07 NOTE — Tx Team (Signed)
Interdisciplinary Treatment and Diagnostic Plan Update  04/07/2019 Time of Session: 10:15am Marvin Crawford MRN: [id number]  Principal Diagnosis: <principal problem not specified>  Secondary Diagnoses: Active Problems:   Cannabis use disorder, moderate, dependence (HCC)   Schizoaffective disorder (HCC)   Current Medications:  Current Facility-Administered Medications  Medication Dose Route Frequency Provider Last Rate Last Dose  . acetaminophen (TYLENOL) tablet 650 mg  650 mg Oral Q6H PRN Malvin Johns, MD   650 mg at 04/05/19 1015  . alum & mag hydroxide-simeth (MAALOX/MYLANTA) 200-200-20 MG/5ML suspension 30 mL  30 mL Oral Q4H PRN Malvin Johns, MD      . haloperidol (HALDOL) tablet 5 mg  5 mg Oral Q6H PRN Malvin Johns, MD   5 mg at 04/06/19 1010   And  . benztropine (COGENTIN) tablet 1 mg  1 mg Oral Q6H PRN Malvin Johns, MD   1 mg at 04/06/19 1010  . benztropine (COGENTIN) tablet 2 mg  2 mg Oral BID Malvin Johns, MD   2 mg at 04/07/19 3559  . diphenhydrAMINE (BENADRYL) injection 50 mg  50 mg Intramuscular Once Malvin Johns, MD      . haloperidol (HALDOL) tablet 10 mg  10 mg Oral TID Malvin Johns, MD   10 mg at 04/07/19 0829  . magnesium hydroxide (MILK OF MAGNESIA) suspension 30 mL  30 mL Oral Daily PRN Malvin Johns, MD      . propranolol (INDERAL) tablet 20 mg  20 mg Oral BID Malvin Johns, MD   20 mg at 04/07/19 7416  . temazepam (RESTORIL) capsule 30 mg  30 mg Oral QHS Malvin Johns, MD   30 mg at 04/06/19 2049  . traZODone (DESYREL) tablet 100 mg  100 mg Oral QHS Antonieta Pert, MD   100 mg at 04/06/19 2049   PTA Medications: No medications prior to admission.    Patient Stressors:    Patient Strengths:    Treatment Modalities: Medication Management, Group therapy, Case management,  1 to 1 session with clinician, Psychoeducation, Recreational therapy.   Physician Treatment Plan for Primary Diagnosis: <principal problem not specified> Long Term Goal(s): Improvement in  symptoms so as ready for discharge Improvement in symptoms so as ready for discharge   Short Term Goals: Ability to identify changes in lifestyle to reduce recurrence of condition will improve Ability to verbalize feelings will improve Ability to disclose and discuss suicidal ideas Ability to demonstrate self-control will improve Ability to identify and develop effective coping behaviors will improve Ability to maintain clinical measurements within normal limits will improve Compliance with prescribed medications will improve Ability to identify triggers associated with substance abuse/mental health issues will improve Ability to identify changes in lifestyle to reduce recurrence of condition will improve Ability to verbalize feelings will improve Ability to disclose and discuss suicidal ideas Ability to demonstrate self-control will improve Ability to identify and develop effective coping behaviors will improve Ability to maintain clinical measurements within normal limits will improve Compliance with prescribed medications will improve Ability to identify triggers associated with substance abuse/mental health issues will improve  Medication Management: Evaluate patient's response, side effects, and tolerance of medication regimen.  Therapeutic Interventions: 1 to 1 sessions, Unit Group sessions and Medication administration.  Evaluation of Outcomes: Adequate for Discharge  Physician Treatment Plan for Secondary Diagnosis: Active Problems:   Cannabis use disorder, moderate, dependence (HCC)   Schizoaffective disorder (HCC)  Long Term Goal(s): Improvement in symptoms so as ready for discharge Improvement in symptoms so as ready  for discharge   Short Term Goals: Ability to identify changes in lifestyle to reduce recurrence of condition will improve Ability to verbalize feelings will improve Ability to disclose and discuss suicidal ideas Ability to demonstrate self-control will  improve Ability to identify and develop effective coping behaviors will improve Ability to maintain clinical measurements within normal limits will improve Compliance with prescribed medications will improve Ability to identify triggers associated with substance abuse/mental health issues will improve Ability to identify changes in lifestyle to reduce recurrence of condition will improve Ability to verbalize feelings will improve Ability to disclose and discuss suicidal ideas Ability to demonstrate self-control will improve Ability to identify and develop effective coping behaviors will improve Ability to maintain clinical measurements within normal limits will improve Compliance with prescribed medications will improve Ability to identify triggers associated with substance abuse/mental health issues will improve     Medication Management: Evaluate patient's response, side effects, and tolerance of medication regimen.  Therapeutic Interventions: 1 to 1 sessions, Unit Group sessions and Medication administration.  Evaluation of Outcomes: Adequate for Discharge   RN Treatment Plan for Primary Diagnosis: <principal problem not specified> Long Term Goal(s): Knowledge of disease and therapeutic regimen to maintain health will improve  Short Term Goals: Ability to participate in decision making will improve, Ability to verbalize feelings will improve, Ability to disclose and discuss suicidal ideas, Ability to identify and develop effective coping behaviors will improve and Compliance with prescribed medications will improve  Medication Management: RN will administer medications as ordered by provider, will assess and evaluate patient's response and provide education to patient for prescribed medication. RN will report any adverse and/or side effects to prescribing provider.  Therapeutic Interventions: 1 on 1 counseling sessions, Psychoeducation, Medication administration, Evaluate responses to  treatment, Monitor vital signs and CBGs as ordered, Perform/monitor CIWA, COWS, AIMS and Fall Risk screenings as ordered, Perform wound care treatments as ordered.  Evaluation of Outcomes: Adequate for Discharge   LCSW Treatment Plan for Primary Diagnosis: <principal problem not specified> Long Term Goal(s): Safe transition to appropriate next level of care at discharge, Engage patient in therapeutic group addressing interpersonal concerns.  Short Term Goals: Engage patient in aftercare planning with referrals and resources and Increase skills for wellness and recovery  Therapeutic Interventions: Assess for all discharge needs, 1 to 1 time with Social worker, Explore available resources and support systems, Assess for adequacy in community support network, Educate family and significant other(s) on suicide prevention, Complete Psychosocial Assessment, Interpersonal group therapy.  Evaluation of Outcomes: Adequate for Discharge   Progress in Treatment: Attending groups: Yes. Participating in groups: Yes. Taking medication as prescribed: Yes. Toleration medication: Yes. Family/Significant other contact made: Yes, individual(s) contacted:  pt's mother Patient understands diagnosis: No. Discussing patient identified problems/goals with staff: Yes. Medical problems stabilized or resolved: Yes. Denies suicidal/homicidal ideation: Yes. Issues/concerns per patient self-inventory: No. Other:   New problem(s) identified: No, Describe:  None  New Short Term/Long Term Goal(s): Medication stabilization, elimination of SI thoughts, and development of a comprehensive mental wellness plan.   Patient Goals:  "Make friends when I get out of the hospital"  Discharge Plan or Barriers: Patient will be discharging home and will be going to outpatient treatment at [facility]. CSW gave resources for adult day programs.   Reason for Continuation of Hospitalization: Patient is discharging today.    Estimated Length of Stay: Patient is discharging today.   Attendees: Patient: 04/07/2019   Physician: Dr. Johnn Hai, MD 04/07/2019  Nursing: Spero CurbBeverly, RN 04/07/2019   RN Care Manager: 04/07/2019   Social Worker: Stephannie PetersJasmine Pankaj [place], [place] 04/07/2019   Recreational Therapist:  04/07/2019  Other:  04/07/2019   Other:  04/07/2019   Other: 04/07/2019    Scribe for Treatment Team: Delphia GratesJasmine M Anam Bobby, LCSW 04/07/2019 11:23 AM

## 2019-04-07 NOTE — Progress Notes (Signed)
  [facility] Adult Case Management Discharge Plan :  Will you be returning to the same living situation after discharge:  Yes,  pt's grandmother and mother At discharge, do you have transportation home?: Yes,  pt's grandmother Do you have the ability to pay for your medications: Yes,  medicaid  Release of information consent forms completed and in the chart;  Patient's signature needed at discharge.  Patient to Follow up at: Follow-up [facility] Follow up.   Why: TEFL teacher contacted office in regards to follow up.  Office will contact you with appointment information.  Contact information: [address] [place] [address] [phone]           Next level of care provider has access to [place] and Suicide Prevention discussed: Yes,  pt's mother  Have you used any form of tobacco in the last 30 days? (Cigarettes, Smokeless Tobacco, Cigars, and/or Pipes): No  Has patient been referred to the Quitline?: N/A patient is not a smoker  Patient has been referred for addiction treatment: Yes  Trecia Rogers, LCSW 04/07/2019, 11:10 AM

## 2019-04-07 NOTE — Progress Notes (Signed)
Recreation Therapy Notes  Date: 8.31.20 Time: 1000 Location: 500 Hall Dayroom  Group Topic: Triggers  Goal Area(s) Addresses:  Patient will identify biggest triggers. Patient will identify how to deal with triggers. Patient will identify how to avoid triggers.   Intervention:  Worksheet, pencils  Activity: Triggers.  Patients were to identify a problem they are currently facing because of triggers.  Patients also identified what their triggers are.  Patients then identified how they deal with triggers head on and how they avoid those triggers.   Education: Communication, Discharge Planning  Education Outcome: Acknowledges understanding/In group clarification offered/Needs additional education.   Clinical Observations/Feedback: Pt did not attend group.      [name], LRT/CTRS    [name] A 04/07/2019 11:36 AM

## 2019-04-07 NOTE — Plan of Care (Signed)
Discharge note  Patient verbalizes readiness for discharge. Follow up plan explained, AVS, Transition record and SRA given. Prescriptions and teaching provided. Belongings returned and signed for. Suicide safety plan completed and signed. Patient verbalizes understanding. Patient denies SI/HI and assures this Probation officer he will seek assistance should that change. Patient discharged to the lobby where the pt's mother was waiting.  Problem: Education: Goal: Knowledge of [facility] Education information/materials will improve Outcome: Adequate for Discharge Goal: Emotional status will improve Outcome: Adequate for Discharge Goal: Mental status will improve Outcome: Adequate for Discharge Goal: Verbalization of understanding the information provided will improve Outcome: Adequate for Discharge   Problem: Activity: Goal: Interest or engagement in activities will improve Outcome: Adequate for Discharge Goal: Sleeping patterns will improve Outcome: Adequate for Discharge   Problem: Coping: Goal: Ability to verbalize frustrations and anger appropriately will improve Outcome: Adequate for Discharge Goal: Ability to demonstrate self-control will improve Outcome: Adequate for Discharge   Problem: Health Behavior/Discharge Planning: Goal: Identification of resources available to assist in meeting health care needs will improve Outcome: Adequate for Discharge Goal: Compliance with treatment plan for underlying cause of condition will improve Outcome: Adequate for Discharge   Problem: Physical Regulation: Goal: Ability to maintain clinical measurements within normal limits will improve Outcome: Adequate for Discharge   Problem: Safety: Goal: Periods of time without injury will increase Outcome: Adequate for Discharge

## 2019-04-07 NOTE — Progress Notes (Signed)
Recreation Therapy Notes  Date: 8.31.20 Time: 1000 Location: 500 Hall Dayroom  Group Topic: Triggers  Goal Area(s) Addresses:  Patient will identify biggest triggers. Patient will identify how to deal with triggers. Patient will identify how to avoid triggers.   Behavioral Response:  Minimal  Intervention:  Worksheet, pencils  Activity: Triggers.  Patients were to identify a problem they are currently facing because of triggers.  Patients also identified what their triggers are.  Patients then identified how they deal with triggers head on and how they avoid those triggers.   Education: Communication, Discharge Planning  Education Outcome: Acknowledges understanding/In group clarification offered/Needs additional education.   Clinical Observations/Feedback: Pt expressed his biggest problem was making friends.  Pt stated his biggest triggers were not talking to friends and sleeping.  Pt avoids triggers by listening to music and exercise.  Pt deals with triggers head on by "hanging out with them".    [name], LRT/CTRS       Ria Comment, Jennae Hakeem A 04/07/2019 11:08 AM

## 2019-04-07 NOTE — Plan of Care (Signed)
Pt attended on recreation therapy group session.    Zarian Colpitts, LRT/CTRS 

## 2019-04-07 NOTE — Progress Notes (Signed)
Adult Psychoeducational Group Note  Date:  04/07/2019 Time:  12:15 AM  Group Topic/Focus:  Wrap-Up Group:   The focus of this group is to help patients review their daily goal of treatment and discuss progress on daily workbooks.  Participation Level:  Minimal  Participation Quality:  Appropriate  Affect:  Appropriate  Cognitive:  Appropriate  Insight: Limited  Engagement in Group:  Developing/Improving  Modes of Intervention:  Discussion  Additional Comments: Pt stated his goal for today was to focus on his treatment plan. Pt stated he felt he accomplished his goal today. Pt stated his relationship with his family has improved since he was admitted here. Pt stated he felt better about himself today. Pt rated her overall day a 10. Pt stated his appetite was pretty good today. Pt stated his goal for tonight was to get some rest. Pt stated he is hoping to be discharge tomorrow. Pt did not complain of any pain tonight. Pt stated he would alert staff if anything changes.   Candy Sledge 04/07/2019, 12:15 AM

## 2019-04-07 NOTE — [facility] Suicide Risk Assessment (Signed)
[facility] Discharge Suicide Risk Assessment   Principal Problem: Exacerbation and underlying psychotic disorder/harm to others Discharge Diagnoses: Active Problems:   Cannabis use disorder, moderate, dependence (HCC)   Schizoaffective disorder (HCC)   Total Time spent with patient: 45 minutes  Musculoskeletal: Strength & Muscle Tone: within normal limits Gait & Station: normal Patient leans: N/A  Psychiatric Specialty Exam: ROS  Blood pressure (!) 106/55, pulse 93, temperature 97.9 F (36.6 C), temperature source Oral, resp. rate 18, height 6' 2.25" (1.886 m), weight 117.5 kg, SpO2 98 %.Body mass index is 33.03 kg/m.  General Appearance: Casual  Eye Contact::  Good  Speech:  Clear and Coherent409  Volume:  Decreased  Mood:  Euthymic  Affect:  Restricted  Thought Process:  Coherent, Linear and Descriptions of Associations: Circumstantial  Orientation:  Full (Time, Place, and Person)  Thought Content:  Logical and Tangential  Suicidal Thoughts:  No  Homicidal Thoughts:  No  Memory:  Recent;   Fair Remote;   Fair  Judgement:  Fair  Insight:  Fair  Psychomotor Activity:  Normal  Concentration:  Good  Recall:  Good  Fund of Knowledge:Good  Language: Good  Akathisia:  Negative  Handed:  Right  AIMS (if indicated):     Assets:  Communication Skills Desire for Improvement  Sleep:  Number of Hours: 5.25  Cognition: WNL  ADL's:  Intact   Mental Status Per Nursing Assessment::   On Admission:  NA  Demographic Factors:  Male and Unemployed  Loss Factors: Decrease in vocational status  Historical Factors: Impulsivity  Risk Reduction Factors:   Sense of responsibility to family, Religious beliefs about death, Living with another person, especially a relative and Positive social support  Continued Clinical Symptoms:  Previous Psychiatric Diagnoses and Treatments  Cognitive Features That Contribute To Risk:  Polarized thinking    Suicide Risk:  Minimal: No  identifiable suicidal ideation.  Patients presenting with no risk factors but with morbid ruminations; may be classified as minimal risk based on the severity of the depressive symptoms  Follow-up Information    Helayne Seminole, MD. Call on 04/07/2019.   Specialty: Otolaryngology Why: and discuss your visit here and see when they want to see you in the office Contact information: [place] [address] [phone]        [facility] Follow up.   Contact information: [place] [address] [phone]           Plan Of Care/Follow-up recommendations:  Activity:  full  Emiliana Blaize, MD 04/07/2019, 9:38 AM

## 2019-04-07 NOTE — Discharge Summary (Signed)
Physician Discharge Summary Note  Patient:  Marvin Crawford is an 23 y.o., male MRN:  [id number] DOB:  [birth year] Patient phone:  [phone] (home)  Patient address:   [address] [place] [address],  Total Time spent with patient: 45 minutes  Date of Admission:  04/02/2019 Date of Discharge: 04/07/2019  Reason for Admission:   History of Present Illness: Marvin Crawford is a 23 year old male, with a past history of schizoaffective disorder, psychosis, depression, anxiety, several suicide attempts, and several prior [facility] hospitalizations, who presented voluntarily to the behavioral health ED on 08/24 for "feeling depressed". History provided by patient is not very clear or detailed. Collateral obtained from talking to his mother and from chart review.   Two days prior to presentation, he crushed up some of his mother's pills and mixed them in her vegetables with a plan to poison her, but decided to throw them away instead. He also reports trying this same method of poisoning his mother 3 years ago, but decided against it then as well. [name] reports that he decided to come to the behavioral health hospital due to "feeling depressed", because he hasn't had anyone to talk to. He has felt depressed since his father passed away suddenly in Jan 02, 2023 due to a massive MI. He's been laying in bed a lot feeling sad. Sometimes he has thoughts that he is not doing good and that he is worthless. He reports increased drug use due to feeling depressed. He enjoys gardening, walking, exercising, listening to music, and deep breathing, and he has still been able to do these things to cope with his loss. Denies insomnia, loss of interest, changes in energy, difficulty concentrating, changes in appetite, and current suicidal ideations.   He reports seeing a therapist for depression and being put on Abilify sometime in March or April. It didn't help, so he stopped taking it. He also notes that occasionally, when he  is listening to [employer], he feels that [name] is telling him that he can do better. This sometimes occurs while he is smoking marijuana. He sometimes feels like people are talking about him on Facebook, though he has never actually seen any negative posts or comments about himself. He does not feel like the people on the TV are trying to talk to him or send him messages. Denies AVH, current SI, and current HI. He occasionally feels that his heart is racing and has shortness of breath when feeling anxious. He feels anxious around people sometimes. Denies chest pain and diaphoresis.   [name] has been drinking and smoking marijuana since age 89. He says he never got in trouble for either. He normally only drinks and smokes marijuana "once in a blue moon", but in March and April, he started drinking every day. He has stopped drinking daily since then.  Principal Problem: Attempted to harm mother Discharge Diagnoses: Active Problems:   Cannabis use disorder, moderate, dependence (HCC)   Schizoaffective disorder (HCC)   Past Psychiatric History:  Rule out borderline intellectual functioning  Past Medical History:  Past Medical History:  Diagnosis Date  . ADD (attention deficit disorder)   . Anxiety   . Depression   . Environmental allergies   . Schizo-affective psychosis (HCC)    History reviewed. No pertinent surgical history. Family History:  Family History  Problem Relation Age of Onset  . Hypertension Mother   . Hypertension Father   . Mental illness Neg Hx    Family Psychiatric  History: No new data shared Social History:  Social History   Substance and Sexual Activity  Alcohol Use Yes  . Alcohol/week: 1.0 standard drinks  . Types: 1 Standard drinks or equivalent per week   Comment: "once in a blue moon"     Social History   Substance and Sexual Activity  Drug Use Yes  . Types: Other-see comments, Marijuana   Comment: oil smoked in pipe calls "DAB", friends buy  for him    Social History   Socioeconomic History  . Marital status: Single    Spouse name: Not on file  . Number of children: Not on file  . Years of education: Not on file  . Highest education level: Not on file  Occupational History  . Not on file  Social Needs  . Financial resource strain: Not on file  . Food insecurity    Worry: Not on file    Inability: Not on file  . Transportation needs    Medical: Not on file    Non-medical: Not on file  Tobacco Use  . Smoking status: Current Every Day Smoker    Packs/day: 2.00    Start date: 02/05/2019  . Smokeless tobacco: Never Used  Substance and Sexual Activity  . Alcohol use: Yes    Alcohol/week: 1.0 standard drinks    Types: 1 Standard drinks or equivalent per week    Comment: "once in a blue moon"  . Drug use: Yes    Types: Other-see comments, Marijuana    Comment: oil smoked in pipe calls "DAB", friends buy for him  . Sexual activity: Yes    Birth control/protection: Condom  Lifestyle  . Physical activity    Days per week: Not on file    Minutes per session: Not on file  . Stress: Not on file  Relationships  . Social Musicianconnections    Talks on phone: Not on file    Gets together: Not on file    Attends religious service: Not on file    Active member of club or organization: Not on file    Attends meetings of clubs or organizations: Not on file    Relationship status: Not on file  Other Topics Concern  . Not on file  Social History Narrative   Graduated from high school at age 23. He took IEP classes until he was in 10th grade. In 11th and 12th grade, he took normal classes and says he didn't need any extra help. He did 2 months of college at [employer], and this was difficult for him. He dropped out of college, due to difficulty balancing work and school.     Hospital Course:  Patient was admitted under routine precautions during his stay here he was always cooperative and compliant with meds.  He displayed no danger  behaviors toward self or others, however due to the significance of the attempt at harm to his mother we did give him high-dose Haldol/30 mg a day.  He tolerated this well was given benztropine, he actually had a dislocated jaw while here the ER took care of that but bottom line is he had no other medical issues he had some essential hypertension but with a beta-blocker and the addition of an escalation of Haldol that was low normal at the point of discharge 106/55 therefore we will able to discontinue the beta-blocker. At the point of discharge alert oriented no thoughts of harming self or others contracting fully no auditory or visual hallucinations Musculoskeletal: Strength &  Muscle Tone: within normal limits Gait & Station: normal Patient leans: N/A  Psychiatric Specialty Exam: ROS  Blood pressure (!) 106/55, pulse 93, temperature 97.9 F (36.6 C), temperature source Oral, resp. rate 18, height 6' 2.25" (1.886 m), weight 117.5 kg, SpO2 98 %.Body mass index is 33.03 kg/m.  General Appearance: Casual  Eye Contact::  Good  Speech:  Clear and Coherent409  Volume:  Decreased  Mood:  Euthymic  Affect:  Restricted  Thought Process:  Coherent, Linear and Descriptions of Associations: Circumstantial  Orientation:  Full (Time, Place, and Person)  Thought Content:  Logical and Tangential  Suicidal Thoughts:  No  Homicidal Thoughts:  No  Memory:  Recent;   Fair Remote;   Fair  Judgement:  Fair  Insight:  Fair  Psychomotor Activity:  Normal  Concentration:  Good  Recall:  Good  Fund of Knowledge:Good  Language: Good  Akathisia:  Negative  Handed:  Right  AIMS (if indicated):     Assets:  Communication Skills Desire for Improvement  Sleep:  Number of Hours: 5.25  Cognition: WNL  ADL's:  Intact    Physical Findings: AIMS: Facial and Oral Movements Muscles of Facial Expression: None, normal Lips and Perioral Area: None, normal Jaw: None, normal Tongue: None, normal,Extremity  Movements Upper (arms, wrists, hands, fingers): None, normal Lower (legs, knees, ankles, toes): None, normal, Trunk Movements Neck, shoulders, hips: None, normal, Overall Severity Severity of abnormal movements (highest score from questions above): None, normal Incapacitation due to abnormal movements: None, normal Patient's awareness of abnormal movements (rate only patient's report): No Awareness, Dental Status Current problems with teeth and/or dentures?: No Does patient usually wear dentures?: No  CIWA:  CIWA-Ar Total: 1 COWS:  COWS Total Score: 1  Have you used any form of tobacco in the last 30 days? (Cigarettes, Smokeless Tobacco, Cigars, and/or Pipes): No  Has this patient used any form of tobacco in the last 30 days? (Cigarettes, Smokeless Tobacco, Cigars, and/or Pipes) Yes, No  Blood Alcohol level:  Lab Results  Component Value Date   ETH <10 10/12/2018   ETH <10 50/53/9767    Metabolic Disorder Labs:  Lab Results  Component Value Date   HGBA1C 5.9 (H) 10/15/2018   MPG 122.63 10/15/2018   MPG 102.54 05/10/2017   Lab Results  Component Value Date   PROLACTIN 3.4 (L) 10/15/2018   PROLACTIN 6.5 05/10/2017   Lab Results  Component Value Date   CHOL 138 10/15/2018   TRIG 46 10/15/2018   HDL 29 (L) 10/15/2018   CHOLHDL 4.8 10/15/2018   VLDL 9 10/15/2018   LDLCALC 100 (H) 10/15/2018   LDLCALC 79 05/10/2017    See Psychiatric Specialty Exam and Suicide Risk Assessment completed by Attending Physician prior to discharge.  Discharge destination:  Home  Is patient on multiple antipsychotic therapies at discharge:  No   Has Patient had three or more failed trials of antipsychotic monotherapy by history:  No  Recommended Plan for Multiple Antipsychotic Therapies: NA   Allergies as of 04/07/2019   No Known Allergies     Medication List    TAKE these medications     Indication  benztropine 1 MG tablet Commonly known as: COGENTIN Take 1 tablet (1 mg total)  by mouth 2 (two) times daily.  Indication: Extrapyramidal Reaction caused by Medications   haloperidol 10 MG tablet Commonly known as: HALDOL 1 in a m 2 at h s  Indication: Psychosis   traZODone 100 MG tablet Commonly known  as: DESYREL Take 1 tablet (100 mg total) by mouth at bedtime as needed for sleep.  Indication: Anxiety Disorder      Follow-up Information    Graylin ShiverMarcellino, Amanda J, MD. Call on 04/07/2019.   Specialty: Otolaryngology Why: and discuss your visit here and see when they want to see you in the office Contact information: [address] [place] [address] [phone]        [facility] Follow up.   Contact information: [address] [place] [address] [phone]           Signed: Malvin JohnsFARAH,Timmi Devora, MD 04/07/2019, 9:40 AM

## 2019-04-12 ENCOUNTER — Other Ambulatory Visit: Payer: Self-pay

## 2019-04-12 ENCOUNTER — Encounter (HOSPITAL_COMMUNITY): Payer: Self-pay | Admitting: Emergency Medicine

## 2019-04-12 ENCOUNTER — Observation Stay (HOSPITAL_COMMUNITY)
Admission: RE | Admit: 2019-04-12 | Discharge: 2019-04-13 | Disposition: A | Discharge status: Home / Self Care | Payer: Medicaid Other | Attending: Psychiatry | Admitting: Psychiatry

## 2019-04-12 DIAGNOSIS — Z915 Personal history of self-harm: Secondary | ICD-10-CM | POA: Diagnosis not present

## 2019-04-12 DIAGNOSIS — G4700 Insomnia, unspecified: Secondary | ICD-10-CM | POA: Diagnosis not present

## 2019-04-12 DIAGNOSIS — F251 Schizoaffective disorder, depressive type: Secondary | ICD-10-CM

## 2019-04-12 DIAGNOSIS — F259 Schizoaffective disorder, unspecified: Secondary | ICD-10-CM | POA: Diagnosis present

## 2019-04-12 DIAGNOSIS — Z20828 Contact with and (suspected) exposure to other viral communicable diseases: Secondary | ICD-10-CM | POA: Diagnosis not present

## 2019-04-12 DIAGNOSIS — R45851 Suicidal ideations: Secondary | ICD-10-CM | POA: Diagnosis not present

## 2019-04-12 DIAGNOSIS — F419 Anxiety disorder, unspecified: Secondary | ICD-10-CM | POA: Diagnosis not present

## 2019-04-12 DIAGNOSIS — F23 Brief psychotic disorder: Secondary | ICD-10-CM

## 2019-04-12 DIAGNOSIS — Z79899 Other long term (current) drug therapy: Secondary | ICD-10-CM | POA: Diagnosis not present

## 2019-04-12 DIAGNOSIS — Z9114 Patient's other noncompliance with medication regimen: Secondary | ICD-10-CM | POA: Diagnosis not present

## 2019-04-12 MED ORDER — ALUM & MAG HYDROXIDE-SIMETH 200-200-20 MG/5ML PO SUSP: 30.0000 mL | ORAL | Status: DC | PRN

## 2019-04-12 MED ORDER — MAGNESIUM HYDROXIDE 400 MG/5ML PO SUSP: 30.0000 mL | Freq: Every day | ORAL | Status: DC | PRN

## 2019-04-12 MED ORDER — ACETAMINOPHEN 325 MG PO TABS: 650.0000 mg | ORAL_TABLET | Freq: Four times a day (QID) | ORAL | Status: DC | PRN

## 2019-04-12 NOTE — Plan of Care (Signed)
[place] [facility]ation Crisis Plan  Reason for Crisis Plan:  Crisis Stabilization   Plan of Care:  Referral for Inpatient Hospitalization  Family Support:    yes  Current Living Environment:  Living Arrangements: Other relatives  Insurance:   Hospital Account    Name Acct ID Class Status Primary Coverage   Marvin Crawford, Marvin Crawford [id number] [place]        Guarantor Account (for Hospital Account [id number])    Name Relation to Pt Service Area Active? Acct Type   Marvin Crawford Self [facility] Yes Behavioral Health   Address Phone       [address] [place], [place] [address] [phone](H)          Coverage Information (for Hospital Account [id number])    F/O Payor/Plan Precert #   [facility] MEDICAID/[place] MEDICA[place]    Subscriber Subscriber #   Marvin Crawford, Marvin Crawford [id number]   Address Phone   [address] [place], [place] [address] [phone]      Legal Guardian:  Legal Guardian: Other:(na)  Primary Care Provider:  Patient, No Pcp Per  Current Outpatient Providers:  Marvin Dresser, DO  Psychiatrist:  Name of Psychiatrist: [facility]   Counselor/Therapist:  Name of Therapist: [facility]   Compliant with Medications:  no  Additional Information:   Marvin Crawford 9/5/202011:24 PM

## 2019-04-12 NOTE — BH Assessment (Signed)
Tele Assessment Note   Patient Name: Marvin Crawford MRN: [id number] Referring Physician: Patient was a walk in at [facility] Location of Patient: [facility] Location of Provider: [place] is an 23 y.o. male.  Patient is a walk in at [facility].  Patient reports SI with a plan to overdose on his psych medication.    Patient is responding to internal stimuli.  Patient reports that he has not been compliant with taking his psychiatric medication.   Patient reports a prior suicide attempt 2 years ago when he tied a belt around his neck because he thought that, "people were talking about him".   Patient reports inpatient psychiatric hospitalizations in 2020, 2018, and 2016 at [facility] and [facility].   Patient lives with his mother and brother.  Patient receives ACTT services with [employer] [facility].  Patient denies HI and substance abuse.   Diagnosis: Schizoaffective   Past Medical History:  Past Medical History:  Diagnosis Date  . ADD (attention deficit disorder)   . Anxiety   . Depression   . Environmental allergies   . Schizo-affective psychosis ([place])     No past surgical history on file.  Family History:  Family History  Problem Relation Age of Onset  . Hypertension Mother   . Hypertension Father   . Mental illness Neg Hx     Social History:  reports that he has been smoking. He started smoking about 2 months ago. He has been smoking about 2.00 packs per day. He has never used smokeless tobacco. He reports current alcohol use of about 1.0 standard drinks of alcohol per week. He reports current drug use. Drugs: Other-see comments and Marijuana.  Additional Social History:  Alcohol / Drug Use History of alcohol / drug use?: No history of alcohol / drug abuse  Allergies: No Known Allergies  Home Medications:  Medications Prior to Admission  Medication Sig Dispense Refill  . benztropine (COGENTIN) 1 MG tablet Take 1 tablet (1 mg total) by mouth 2 (two) times  daily. 60 tablet 2  . haloperidol (HALDOL) 10 MG tablet 1 in a m 2 at h s 90 tablet 1  . traZODone (DESYREL) 100 MG tablet Take 1 tablet (100 mg total) by mouth at bedtime as needed for sleep. 90 tablet 1    OB/GYN Status:  No LMP for male patient.  General Assessment Data Location of Assessment: [facility] Assessment Services TTS Assessment: In system Is this a Tele or Face-to-Face Assessment?: Face-to-Face Is this an Initial Assessment or a Re-assessment for this encounter?: Initial Assessment Patient Accompanied by:: Other Language Other than English: No Living Arrangements: Other (Comment)(Lives with his mother and brother ) What gender do you identify as?: Male Marital status: Single Maiden name: na Pregnancy Status: No Living Arrangements: Other relatives Can pt return to current living arrangement?: Yes Admission Status: Voluntary Is patient capable of signing voluntary admission?: Yes Referral Source: Self/Family/Friend Insurance type: Medicaid  Medical Screening Exam ([place]) Medical Exam completed: Yes  Crisis Care Plan Living Arrangements: Other relatives Legal Guardian: Other:(na) Name of Psychiatrist: [facility]  Name of Therapist: [facility]   Education Status Is patient currently in school?: No Highest grade of school patient has completed: 12th grade Is the patient employed, unemployed or receiving disability?: Receiving disability income  Risk to self with the past 6 months Suicidal Ideation: Yes-Currently Present Has patient been a risk to self within the past 6 months prior to admission? : Yes  Suicidal Intent: Yes-Currently Present Has patient had any suicidal intent within the past 6 months prior to admission? : Yes Is patient at risk for suicide?: Yes Suicidal Plan?: Yes-Currently Present Has patient had any suicidal plan within the past 6 months prior to admission? : Yes Specify Current Suicidal Plan: y Access to Means: Yes Specify Access  to Suicidal Means: pills What has been your use of drugs/alcohol within the last 12 months?: none reported Previous Attempts/Gestures: Yes How many times?: 1 Other Self Harm Risks: na Triggers for Past Attempts: Unpredictable Intentional Self Injurious Behavior: None Comment - Self Injurious Behavior: None Reported Family Suicide History: No Recent stressful life event(s): Other (Comment) Persecutory voices/beliefs?: Yes Depression: Yes Depression Symptoms: Despondent, Insomnia, Tearfulness, Isolating, Fatigue, Guilt, Loss of interest in usual pleasures, Feeling worthless/self pity Substance abuse history and/or treatment for substance abuse?: No Suicide prevention information given to non-admitted patients: Yes  Risk to Others within the past 6 months Homicidal Ideation: No Does patient have any lifetime risk of violence toward others beyond the six months prior to admission? : No Thoughts of Harm to Others: No Comment - Thoughts of Harm to Others: na Current Homicidal Intent: No Current Homicidal Plan: No Describe Current Homicidal Plan: na Access to Homicidal Means: No Describe Access to Homicidal Means: n Identified Victim: na History of harm to others?: No Assessment of Violence: None Noted Violent Behavior Description: n Does patient have access to weapons?: No Criminal Charges Pending?: No Does patient have a court date: No Is patient on probation?: No  Psychosis Hallucinations: Auditory, Visual Delusions: None noted  Mental Status Report Appearance/Hygiene: Unremarkable Eye Contact: Fair Motor Activity: Freedom of movement Speech: Soft Level of Consciousness: Alert Mood: Pleasant Affect: Sad Anxiety Level: None Thought Processes: Flight of Ideas Judgement: Unimpaired Orientation: Person, Place, Time, Situation Obsessive Compulsive Thoughts/Behaviors: None  Cognitive Functioning Concentration: Decreased Memory: Recent Intact, Remote Intact Is patient  IDD: No Insight: Poor Impulse Control: Poor Appetite: Poor Have you had any weight changes? : No Change Sleep: Decreased Total Hours of Sleep: 4 Vegetative Symptoms: Decreased grooming  ADLScreening [facility]) Patient's cognitive ability adequate to safely complete daily activities?: Yes Patient able to express need for assistance with ADLs?: No Independently performs ADLs?: Yes (appropriate for developmental age)  Prior Inpatient Therapy Prior Inpatient Therapy: Yes Prior Therapy Dates: 2016, 2018, 2020 [facility] Reason for Treatment: Psychosis  Prior Outpatient Therapy Prior Outpatient Therapy: Yes Prior Therapy Dates: Ongoing  Prior Therapy Facilty[facility]Provider[facility]): [facility]  Reason for Treatment: ACTT Services  Does patient have an ACCT team?: Yes Does patient have Intensive In-House Services?  : No Does patient have [facility] services? : No Does patient have P4CC services?: No  ADL Screening (condition at time of admission) Patient's cognitive ability adequate to safely complete daily activities?: Yes Is the patient deaf or have difficulty hearing?: No Does the patient have difficulty seeing, even when wearing glasses/contacts?: No Does the patient have difficulty concentrating, remembering, or making decisions?: No Patient able to express need for assistance with ADLs?: No Does the patient have difficulty dressing or bathing?: No Independently performs ADLs?: Yes (appropriate for developmental age) Does the patient have difficulty walking or climbing stairs?: No Weakness of Legs: None Weakness of Arms/Hands: None  Home Assistive Devices/Equipment Home Assistive Devices/Equipment: None    Abuse/Neglect Assessment (Assessment to be complete while patient is alone) Physical Abuse: Denies Verbal Abuse: Denies Sexual Abuse: Denies Exploitation of patient/patient's resources: Denies Self-Neglect:  Denies Values / Beliefs Cultural Requests During Hospitalization: None Spiritual Requests During Hospitalization: None   Advance Directives (For Healthcare) Does Patient Have a Medical Advance Directive?: No Would patient like information on creating a medical advance directive?: No - Patient declined          Disposition: Accepted to OBS.  Disposition Initial Assessment Completed for this Encounter: Yes Disposition of Patient: (Accepted to OBS)  This service was provided via telemedicine using a 2-way, interactive audio and video technology.  Names of all persons participating in this telemedicine service and their role in this encounter. Name: Phillip Heal  Role: LCAS-A  Name: Althea Charon  Role: Patient    Linton Rump 04/12/2019 10:22 PM

## 2019-04-12 NOTE — Progress Notes (Signed)
Patient ID: Marvin Crawford, male   DOB: 1995/10/21, 23 y.o.   MRN: [id number] Pt A&O x 4, presents with SI, plan to overdose on pills.  Positive audio visual hallucinations, pt reports seeing things that aren't really there.  Pt calm & cooperative, no distress noted.  Skin search completed, pt changed into scrubs.  Monitoring for safety.
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
## 2019-04-13 DIAGNOSIS — F259 Schizoaffective disorder, unspecified: Secondary | ICD-10-CM | POA: Diagnosis not present

## 2019-04-13 LAB — CBC
HCT: 43.2 % (ref 39.0–52.0)
Hemoglobin: 14.4 g/dL (ref 13.0–17.0)
MCH: 28.8 pg (ref 26.0–34.0)
MCHC: 33.3 g/dL (ref 30.0–36.0)
MCV: 86.4 fL (ref 80.0–100.0)
Platelets: 237 10*3/uL (ref 150–400)
RBC: 5 MIL/uL (ref 4.22–5.81)
RDW: 13 % (ref 11.5–15.5)
WBC: 6.3 10*3/uL (ref 4.0–10.5)
nRBC: 0 % (ref 0.0–0.2)

## 2019-04-13 LAB — COMPREHENSIVE METABOLIC PANEL
ALT: 33 U/L (ref 0–44)
AST: 22 U/L (ref 15–41)
Albumin: 3.8 g/dL (ref 3.5–5.0)
Alkaline Phosphatase: 63 U/L (ref 38–126)
Anion gap: 6 (ref 5–15)
BUN: 10 mg/dL (ref 6–20)
CO2: 25 mmol/L (ref 22–32)
Calcium: 9.2 mg/dL (ref 8.9–10.3)
Chloride: 106 mmol/L (ref 98–111)
Creatinine, Ser: 1.08 mg/dL (ref 0.61–1.24)
GFR calc Af Amer: >60 mL/min (ref >60)
GFR calc non Af Amer: >60 mL/min (ref >60)
Glucose, Bld: 106 mg/dL — ABNORMAL HIGH (ref 70–99)
Potassium: 3.8 mmol/L (ref 3.5–5.1)
Sodium: 137 mmol/L (ref 135–145)
Total Bilirubin: 0.8 mg/dL (ref 0.3–1.2)
Total Protein: 6.7 g/dL (ref 6.5–8.1)

## 2019-04-13 LAB — SARS CORONAVIRUS 2 BY RT PCR (HOSPITAL ORDER, PERFORMED IN ~~LOC~~ HOSPITAL LAB): SARS Coronavirus 2: NEGATIVE

## 2019-04-13 MED ORDER — BENZTROPINE MESYLATE 1 MG PO TABS
1.0000 mg | ORAL_TABLET | Freq: Two times a day (BID) | ORAL | Status: DC
  Administered 2019-04-13: 1 mg via ORAL
  Filled 2019-04-13: qty 1

## 2019-04-13 MED ORDER — HALOPERIDOL 5 MG PO TABS: 20.0000 mg | ORAL_TABLET | Freq: Every day | ORAL | Status: DC

## 2019-04-13 MED ORDER — HALOPERIDOL 5 MG PO TABS
10.0000 mg | ORAL_TABLET | Freq: Every day | ORAL | Status: DC
  Administered 2019-04-13: 10 mg via ORAL
  Filled 2019-04-13: qty 2

## 2019-04-13 MED ORDER — TRAZODONE HCL 100 MG PO TABS: 100.0000 mg | ORAL_TABLET | Freq: Every evening | ORAL | Status: DC | PRN

## 2019-04-13 NOTE — Discharge Summary (Addendum)
Patient seen and case discussed with Dr. Darleene Cleaver. At this time patient reports improvement in symptoms and is ready to return home. He reports he has an upcoming appointment with [facility] on Wednesday at 1:30pm. He is encouraged to keep that appointment and continue taking his medication until he meets his new team. At this time patient denies SI/HI/AVH. He is ready for discharge.   Patient seen face-to-face for psychiatric evaluation, chart reviewed and case discussed with the physician extender and developed treatment plan. Reviewed the information documented and agree with the treatment plan. Corena Pilgrim, MD

## 2019-04-13 NOTE — Progress Notes (Signed)
Patient ID: Marvin Crawford, male   DOB: 1996-03-08, 23 y.o.   MRN: [id number]  D: Pt alert and oriented on the unit.   A: Education, support, and encouragement provided. Discharge summary, medications and follow up appointments reviewed with pt. Suicide prevention resources provided, including "My 3 App." Pt's belongings in locker # 24 returned and belongings sheet signed.  R: Pt denies SI/HI, A/VH, pain, or any concerns at this time. Pt ambulatory on and off unit. Pt discharged to lobby.

## 2019-04-14 NOTE — H&P (Signed)
Behavioral Health Medical Screening Exam  Marvin Crawford is an 23 y.o. male.  Psychiatric Specialty Exam: Physical Exam  Constitutional: He is oriented to person, place, and time. He appears well-developed and well-nourished. No distress.  HENT:  Head: Normocephalic and atraumatic.  Right Ear: External ear normal.  Left Ear: External ear normal.  Eyes: Pupils are equal, round, and reactive to light. Right eye exhibits no discharge. Left eye exhibits no discharge.  Respiratory: Effort normal. No respiratory distress.  Musculoskeletal: Normal range of motion.  Neurological: He is alert and oriented to person, place, and time.  Skin: He is not diaphoretic.  Psychiatric: His mood appears anxious. He is not withdrawn. Thought content is paranoid and delusional. He exhibits a depressed mood. He expresses suicidal ideation. He expresses no homicidal ideation. He expresses suicidal plans.    Review of Systems  Constitutional: Negative for chills, diaphoresis, fever, malaise/fatigue and weight loss.  Respiratory: Negative for cough and shortness of breath.   Cardiovascular: Negative for chest pain.  Gastrointestinal: Negative for diarrhea, nausea and vomiting.  Neurological: Negative for dizziness.  Psychiatric/Behavioral: Positive for depression, hallucinations and suicidal ideas. Negative for memory loss and substance abuse. The patient is nervous/anxious and has insomnia.   All other systems reviewed and are negative.   Blood pressure 128/80, pulse (!) 105, temperature 98.3 F (36.8 C), temperature source Oral, resp. rate 18, SpO2 100 %.There is no height or weight on file to calculate BMI.  General Appearance: Casual and Fairly Groomed  Eye Contact:  Minimal  Speech:  Clear and Coherent and Delayed at times  Volume:  Decreased  Mood:  Anxious, Depressed, Hopeless and Worthless  Affect:  Congruent and Flat  Thought Process:  Descriptions of Associations: Loose  Orientation:  Full  (Time, Place, and Person)  Thought Content:  Delusions, Hallucinations: Command:  voices tell him to hit people and Paranoid Ideation  Suicidal Thoughts:  Yes.  with intent/plan  Homicidal Thoughts:  No  Memory:  Immediate;   Fair Recent;   Fair  Judgement:  Impaired  Insight:  Lacking  Psychomotor Activity:  Decreased  Concentration:  Concentration: Fair and Attention Span: Fair  Recall:  [employer] of Knowledge:  Fair  Language:  Good  Akathisia:  Negative  Handed:  Right  AIMS (if indicated):     Assets:  Desire for Improvement Leisure Time Physical Health  ADL's:  Intact  Cognition:  Impaired,  Mild  Sleep:        Blood pressure 131/80, pulse (!) 113, temperature 97.7 F (36.5 C), resp. rate 16, SpO2 100 %.  Recommendations:  Based on my evaluation the patient does not appear to have an emergency medical condition.  Rozetta Nunnery, NP 04/14/2019, 12:34 AM

## 2019-05-14 ENCOUNTER — Other Ambulatory Visit: Payer: Self-pay

## 2019-05-14 ENCOUNTER — Other Ambulatory Visit: Payer: Self-pay | Admitting: Behavioral Health

## 2019-05-14 ENCOUNTER — Encounter (HOSPITAL_COMMUNITY): Payer: Self-pay | Admitting: Emergency Medicine

## 2019-05-14 ENCOUNTER — Inpatient Hospital Stay (HOSPITAL_COMMUNITY)
Admission: AD | Admit: 2019-05-14 | Discharge: 2019-05-20 | DRG: 885 | Disposition: A | Discharge status: Home / Self Care | Payer: Medicaid Other | Attending: Psychiatry | Admitting: Psychiatry

## 2019-05-14 DIAGNOSIS — F259 Schizoaffective disorder, unspecified: Secondary | ICD-10-CM | POA: Diagnosis present

## 2019-05-14 DIAGNOSIS — F17210 Nicotine dependence, cigarettes, uncomplicated: Secondary | ICD-10-CM | POA: Diagnosis present

## 2019-05-14 DIAGNOSIS — Z79899 Other long term (current) drug therapy: Secondary | ICD-10-CM

## 2019-05-14 DIAGNOSIS — F250 Schizoaffective disorder, bipolar type: Principal | ICD-10-CM | POA: Diagnosis present

## 2019-05-14 DIAGNOSIS — R000 Tachycardia, unspecified: Secondary | ICD-10-CM | POA: Diagnosis not present

## 2019-05-14 DIAGNOSIS — F419 Anxiety disorder, unspecified: Secondary | ICD-10-CM | POA: Diagnosis present

## 2019-05-14 DIAGNOSIS — Z8249 Family history of ischemic heart disease and other diseases of the circulatory system: Secondary | ICD-10-CM

## 2019-05-14 DIAGNOSIS — Z20828 Contact with and (suspected) exposure to other viral communicable diseases: Secondary | ICD-10-CM | POA: Diagnosis present

## 2019-05-14 DIAGNOSIS — F1220 Cannabis dependence, uncomplicated: Secondary | ICD-10-CM | POA: Diagnosis present

## 2019-05-14 DIAGNOSIS — Z9114 Patient's other noncompliance with medication regimen: Secondary | ICD-10-CM

## 2019-05-14 DIAGNOSIS — F23 Brief psychotic disorder: Secondary | ICD-10-CM | POA: Diagnosis present

## 2019-05-14 DIAGNOSIS — Z915 Personal history of self-harm: Secondary | ICD-10-CM

## 2019-05-14 LAB — SARS CORONAVIRUS 2 BY RT PCR (HOSPITAL ORDER, PERFORMED IN ~~LOC~~ HOSPITAL LAB): SARS Coronavirus 2: NEGATIVE

## 2019-05-14 MED ORDER — BENZTROPINE MESYLATE 1 MG PO TABS
1.0000 mg | ORAL_TABLET | Freq: Two times a day (BID) | ORAL | Status: DC
  Administered 2019-05-14 – 2019-05-20 (×12): 1 mg via ORAL
  Filled 2019-05-14 (×17): qty 1

## 2019-05-14 MED ORDER — HALOPERIDOL 5 MG PO TABS
10.0000 mg | ORAL_TABLET | Freq: Two times a day (BID) | ORAL | Status: DC
  Administered 2019-05-14 – 2019-05-15 (×2): 10 mg via ORAL
  Filled 2019-05-14 (×7): qty 2

## 2019-05-14 MED ORDER — TRAZODONE HCL 100 MG PO TABS
100.0000 mg | ORAL_TABLET | Freq: Every evening | ORAL | Status: DC | PRN
  Administered 2019-05-14 – 2019-05-17 (×4): 100 mg via ORAL
  Filled 2019-05-14 (×4): qty 1

## 2019-05-14 NOTE — H&P (Signed)
[place] Observation Unit Provider Admission PAA/H&P  Patient Identification: Marvin Crawford MRN:  [id number] Date of Evaluation:  05/14/2019 Chief Complaint:  anxiety Principal Diagnosis: <principal problem not specified> Diagnosis:  Active Problems:   * No active hospital problems. *  History of Present Illness: Marvin Crawford is an 23 y.o. male.who presents to [facility] as a walk-in, voluntarily, accompanied with his mother. Patient has a history of schizoaffective disorder. He  was discharged from [facility] on 04-07-2019, and seen again on 04/12/2019. It appears that he was psychiatrically cleared and discharged 04/13/2019. When asked for his reason for admission patient stated," I have been seeing things in black and white. People are talking in my head," He states that he walked down the street naked today.As per mother, she received a phone call from her niece stating that patient was walking down the street naked. Reports patient has been very paranoid thinking that his brother has a gun and is trying to kill him. States," He is forgetting things. Its like he has dementia."  Reports patient drove his car the other day, [place] , walked home and could not remember where he parked it. Reports last week, patient packed all huis belongings and stated he was leaving. Reports she does not believe he would hurt himself intentionally although believes he is a danger to himself because of the psychotic state that he is in. Reports patient has been complaint with his medication although she does not think it is helping. He does have a history of drinking and smoking mariajuana. Patient denies VH, homicidal or suicidal ideations. Patient does have an ACTT team through [employer] [facility].   Associated Signs/Symptoms: Depression Symptoms:  depressed mood, (Hypo) Manic Symptoms:  none Anxiety Symptoms:  none Psychotic Symptoms:  Hallucinations: Auditory Paranoia, PTSD Symptoms: NA Total Time spent with  patient: 20 minutes  Past Psychiatric History: Schizoaffective disorder   Is the patient at risk to self? Yes.    Has the patient been a risk to self in the past 6 months? Yes.    Has the patient been a risk to self within the distant past? Yes.    Is the patient a risk to others? No.  Has the patient been a risk to others in the past 6 months? No.  Has the patient been a risk to others within the distant past? No.      Alcohol Screening:   Substance Abuse History in the last 12 months:  Yes.   Consequences of Substance Abuse: NA Previous Psychotropic Medications: Yes  Psychological Evaluations: Yes  Past Medical History:  Past Medical History:  Diagnosis Date  . ADD (attention deficit disorder)   . Anxiety   . Depression   . Environmental allergies   . Schizo-affective psychosis ([place])    No past surgical history on file. Family History:  Family History  Problem Relation Age of Onset  . Hypertension Mother   . Hypertension Father   . Mental illness Neg Hx    Family Psychiatric History: None noted in chart Tobacco Screening:   Social History:  Social History   Substance and Sexual Activity  Alcohol Use Yes  . Alcohol/week: 1.0 standard drinks  . Types: 1 Standard drinks or equivalent per week   Comment: "once in a blue moon"     Social History   Substance and Sexual Activity  Drug Use Yes  . Types: Other-see comments, Marijuana   Comment: oil smoked in pipe calls "  DAB", friends buy for him    Additional Social History:                           Allergies:  No Known Allergies Lab Results: No results found for this or any previous visit (from the past 48 hour(s)).  Blood Alcohol level:  Lab Results  Component Value Date   ETH <10 10/12/2018   ETH <10 08/17/2017    Metabolic Disorder Labs:  Lab Results  Component Value Date   HGBA1C 5.9 (H) 10/15/2018   MPG 122.63 10/15/2018   MPG 102.54 05/10/2017   Lab Results  Component Value Date    PROLACTIN 3.4 (L) 10/15/2018   PROLACTIN 6.5 05/10/2017   Lab Results  Component Value Date   CHOL 138 10/15/2018   TRIG 46 10/15/2018   HDL 29 (L) 10/15/2018   CHOLHDL 4.8 10/15/2018   VLDL 9 10/15/2018   LDLCALC 100 (H) 10/15/2018   LDLCALC 79 05/10/2017    Current Medications: Current Outpatient Medications  Medication Sig Dispense Refill  . benztropine (COGENTIN) 1 MG tablet Take 1 tablet (1 mg total) by mouth 2 (two) times daily. 60 tablet 2  . haloperidol (HALDOL) 10 MG tablet 1 in a m 2 at h s 90 tablet 1  . traZODone (DESYREL) 100 MG tablet Take 1 tablet (100 mg total) by mouth at bedtime as needed for sleep. 90 tablet 1   No current facility-administered medications for this encounter.    PTA Medications: (Not in a hospital admission)   Musculoskeletal: Strength & Muscle Tone: within normal limits Gait & Station: normal Patient leans: N/A  Psychiatric Specialty Exam: Physical Exam  Nursing note and vitals reviewed. Constitutional: He is oriented to person, place, and time.  Neurological: He is alert and oriented to person, place, and time.    Review of Systems  Psychiatric/Behavioral:       Psychosis   All other systems reviewed and are negative.   Blood pressure 125/66, pulse (!) 126, temperature 99.1 F (37.3 C), temperature source Oral, resp. rate 16, SpO2 98 %.There is no height or weight on file to calculate BMI.  General Appearance: Casual  Eye Contact:  Good  Speech:  Clear and Coherent and Normal Rate  Volume:  Decreased  Mood:  Anxious  Affect:  Congruent  Thought Process:  Coherent, Linear and Descriptions of Associations: Circumstantial  Orientation:  Full (Time, Place, and Person)  Thought Content:  Tangential  Suicidal Thoughts:  No  Homicidal Thoughts:  No  Memory:  Immediate;   Fair Recent;   Fair  Judgement:  Impaired  Insight:  Fair  Psychomotor Activity:  Normal  Concentration:  Concentration: Fair and Attention Span: Fair   Recall:  [employer] of Knowledge:  Fair  Language:  Good  Akathisia:  Negative  Handed:  Right  AIMS (if indicated):     Assets:  Communication Skills Desire for Improvement Resilience Social Support  ADL's:  Intact  Cognition:  WNL  Sleep:         Treatment Plan Summary: Daily contact with patient to assess and evaluate symptoms and progress in treatment  Observation Level/Precautions:  15 minute checks Laboratory:  CBC Chemistry Profile HbAIC UDS:   Medications: Home medications restarted   Estimated LOS: TBD.  Other:  Based on my evaluation, patient does meet inpatient psychiatric hospitalization. There are no available beds so patient was placed on the observation unit until an  appropriate bed becomes available    Denzil MagnusonLaShunda Carrin Vannostrand, NP 10/7/20205:37 PM

## 2019-05-14 NOTE — BH Assessment (Signed)
Assessment Note  Marvin Crawford is an 23 y.o. male who [facility]. Pt was accompanied by his mother, reporting symptoms of depression with suicidal ideation. Pt has a history of schizoaffective disorder and depression. Pt reports medication compliance. Pt reports current suicidal ideation "a little bit". Pt denies current plan. Pt denies past suicide attempts- chart review reflects 2 prior attempts. Pt acknowledges multiple symptoms of Depression, including anhedonia, isolating, feelings of worthlessness & guilt, tearfulness,  & increased irritability. Pt denies homicidal ideation/ history of violence. Pt reports auditory & visual hallucinations "a little bit". He denies feeling paranoid. [name] reports current stressors include pt's father's recent death.   Pt lives with his mother, and supports include his mother. Pt denies hx of abuse and trauma. Pt denies family history of mental illness or substance abuse. Pt has Disability benefits. Pt has fair insight and poor judgment. Pt's memory and concentration appear decreased at this time. Legal history includes no charges.  Protective factors against suicide include good family support, therapeutic relationship, & no access to firearms.?  Pt's [facility] history includes [facility]. [facility] includes [facility]. Pt denies alcohol/ substance abuse. ? MSE: Pt is casually dressed, awake, guarded, oriented x4 with soft, slow speech and normal motor behavior. Eye contact is fair. Pt's mood is depressed and affect is blunted. Affect is congruent with mood. Thought process slowed, appeared to have thought blocking. Pt was cooperative throughout assessment.    Diagnosis: F25.1 Schizoaffective Disorder, Depressive Disposition: Denzil MagnusonLaShunda Thomas, NP recommends inpt psychiatric care  Past Medical History:  Past Medical History:  Diagnosis Date  . ADD (attention deficit disorder)   . Anxiety   .  Depression   . Environmental allergies   . Schizo-affective psychosis (HCC)     No past surgical history on file.  Family History:  Family History  Problem Relation Age of Onset  . Hypertension Mother   . Hypertension Father   . Mental illness Neg Hx     Social History:  reports that he has been smoking. He started smoking about 3 months ago. He has been smoking about 2.00 packs per day. He has never used smokeless tobacco. He reports current alcohol use of about 1.0 standard drinks of alcohol per week. He reports current drug use. Drugs: Other-see comments and Marijuana.  Additional Social History:  Alcohol / Drug Use Pain Medications: None reported Prescriptions: Abilify 10mg  qd; Lexapro 10 mg qd; Lamisil History of alcohol / drug use?: No history of alcohol / drug abuse  CIWA: CIWA-Ar BP: 125/66 Pulse Rate: (!) 126 COWS:    Allergies: No Known Allergies  Home Medications: (Not in a hospital admission)   OB/GYN Status:  No LMP for male patient.  General Assessment Data [facility] Assessment: In system Is this a Tele or Face-to-Face Assessment?: Face-to-Face Is this an Initial Assessment or a Re-assessment for this encounter?: Initial Assessment Patient Accompanied by:: Parent Language Other than English: No Living Arrangements: Other (Comment) What gender do you identify as?: Male Marital status: Single Living Arrangements: Parent(Mother) Can pt return to current living arrangement?: Yes Admission Status: Voluntary Is patient capable of signing voluntary admission?: Yes Referral Source: Self/Family/Friend Insurance type: medicaid     Crisis Care Plan Living Arrangements: Parent(Mother) Name of Psychiatrist: [facility]  Name of Therapist: [facility] (setting this up when Covid quarantine hit)  Education Status Is patient currently in school?: No Highest  grade of school patient has completed: 12th grade Is the  patient employed, unemployed or receiving disability?: Receiving disability income  Risk to self with the past 6 months Suicidal Ideation: Yes-Currently Present("a little bit") Has patient been a risk to self within the past 6 months prior to admission? : Yes Suicidal Intent: No Is patient at risk for suicide?: Yes Suicidal Plan?: No Has patient had any suicidal plan within the past 6 months prior to admission? : Yes Specify Current Suicidal Plan: past- hang self with belt What has been your use of drugs/alcohol within the last 12 months?: denies Previous Attempts/Gestures: Yes How many times?: 2 Other Self Harm Risks: psychosis; previous attempts; recent MH tx; depression sx, recent loss Triggers for Past Attempts: Unpredictable Intentional Self Injurious Behavior: None Family Suicide History: No Recent stressful life event(s): Loss (Comment)(pt's father recently died) Persecutory voices/beliefs?: ("a little bit") Depression: Yes Depression Symptoms: Despondent, Insomnia, Tearfulness, Isolating, Fatigue, Guilt, Loss of interest in usual pleasures, Feeling worthless/self pity, Feeling angry/irritable Substance abuse history and/or treatment for substance abuse?: No Suicide prevention information given to non-admitted patients: Not applicable  Risk to Others within the past 6 months Homicidal Ideation: No Does patient have any lifetime risk of violence toward others beyond the six months prior to admission? : No Thoughts of Harm to Others: No Current Homicidal Intent: No Current Homicidal Plan: No Access to Homicidal Means: No History of harm to others?: No Assessment of Violence: None Noted Does patient have access to weapons?: No Criminal Charges Pending?: No Does patient have a court date: No Is patient on probation?: No  Psychosis Hallucinations: Auditory Delusions: Persecutory  Mental Status Report Appearance/Hygiene: Unremarkable Eye Contact: Fair Motor Activity:  Freedom of movement Speech: Soft Level of Consciousness: Quiet/awake Mood: Depressed, Pleasant Affect: Flat Anxiety Level: Minimal Thought Processes: Thought Blocking, Coherent Judgement: Impaired Orientation: Person, Place, Time, Situation Obsessive Compulsive Thoughts/Behaviors: None  Cognitive Functioning Concentration: Poor Memory: Unable to Assess Is patient IDD: No Insight: Poor Impulse Control: Fair Appetite: Good Have you had any weight changes? : Gain Sleep: No Change Total Hours of Sleep: 8 Vegetative Symptoms: Decreased grooming, Not bathing  ADLScreening [facility]) Patient's cognitive ability adequate to safely complete daily activities?: Yes Patient able to express need for assistance with ADLs?: Yes Independently performs ADLs?: Yes (appropriate for developmental age)  Prior Inpatient Therapy Prior Inpatient Therapy: Yes Prior Therapy Dates: 2016, 2018, 2020 Prior Therapy Facilty/Provider([facility] Reason for Treatment: Psychosis  Prior Outpatient Therapy Prior Outpatient Therapy: Yes Prior Therapy Dates: Ongoing  Prior Therapy Facilty/Provider(s): [facility]  Reason for Treatment: ACTT Services  Does patient have an ACCT team?: Yes Does patient have Intensive In-House Services?  : No Does patient have [facility] services? : No Does patient have P4CC services?: No  ADL Screening (condition at time of admission) Patient's cognitive ability adequate to safely complete daily activities?: Yes Is the patient deaf or have difficulty hearing?: No Does the patient have difficulty seeing, even when wearing glasses/contacts?: No Does the patient have difficulty concentrating, remembering, or making decisions?: No Patient able to express need for assistance with ADLs?: Yes Does the patient have difficulty dressing or bathing?: No Independently performs ADLs?: Yes (appropriate for developmental age) Does the patient have difficulty  walking or climbing stairs?: No Weakness of Legs: None Weakness of Arms/Hands: None  Home Assistive Devices/Equipment Home Assistive Devices/Equipment: None  Therapy Consults (therapy consults require a physician order) PT Evaluation Needed: No OT Evalulation Needed: No  SLP Evaluation Needed: No Abuse/Neglect Assessment (Assessment to be complete while patient is alone) Abuse/Neglect Assessment Can Be Completed: Yes Physical Abuse: Denies Verbal Abuse: Denies Sexual Abuse: Denies Exploitation of patient/patient's resources: Denies Self-Neglect: Denies Values / Beliefs Cultural Requests During Hospitalization: None Spiritual Requests During Hospitalization: None Consults Spiritual Care Consult Needed: No Social Work Consult Needed: No Merchant navy officer (For Healthcare) Does Patient Have a Medical Advance Directive?: No Would patient like information on creating a medical advance directive?: No - Patient declined          Disposition: Denzil Magnuson, NP recommends inpt psychiatric care Disposition Initial Assessment Completed for this Encounter: Yes  On Site Evaluation by:   Reviewed with Physician:    Clearnce Sorrel 05/14/2019 6:35 PM

## 2019-05-14 NOTE — Plan of Care (Signed)
[place] Observation Crisis Plan  Reason for Crisis Plan:  Crisis Stabilization   Plan of Care:  Referral for Inpatient Hospitalization  Family Support:      Current Living Environment:  Living Arrangements: Parent(Mother)  Insurance:   Hospital Account    Name Acct ID Class Status Primary Coverage   Marvin Crawford, Marvin Crawford [id number] [place]        Guarantor Account (for Hospital Account [id number])    Name Relation to Pt Service Area Active? Acct Type   Marvin Crawford Self [facility] Yes Behavioral Health   Address Phone       [address] [place], [place] [address] [phone](H)          Coverage Information (for Hospital Account [id number])    F/O Payor/Plan Precert #   [facility] MEDICAID/[place] MEDICA[place]    Subscriber Subscriber #   Marvin Crawford, Marvin Crawford [id number]   Address Phone   [address] [place], [place] [address] [phone]      Legal Guardian:     Primary Care Provider:  Patient, No Pcp Per  Current Outpatient Providers:  Hampton Abbot, MD  Psychiatrist:  Name of Psychiatrist: [facility]   Counselor/Therapist:  Name of Therapist: [facility] (setting this up when Covid quarantine hit)  Compliant with Medications:  Yes  Additional Information:   Marvin Crawford 10/7/202010:54 PM

## 2019-05-14 NOTE — Progress Notes (Signed)
Patient ID: Marvin Crawford, male   DOB: 04-21-1996, 23 y.o.   MRN: [id number] Pt A&O x 4, presents with complaint of SI, passive, no plan and depression.  Mother reports pt current stressor include father's recent death.  Denies HI, admits to [place].  Skin search completed,  Pt calm & cooperative, monitoring for safety.  No distress noted.

## 2019-05-14 NOTE — H&P (Signed)
Behavioral Health Medical Screening Exam  Marvin Crawford is an 23 y.o. male.who presents to [facility] as a walk-in, voluntarily, accompanied with his mother. Patient has a history of schizoaffective disorder. He  was discharged from [facility] on 04-07-2019, and seen again on 04/12/2019. It appears that he was psychiatrically cleared and discharged 04/13/2019. When asked for his reason for admission patient stated," I have been seeing things in black and white. People are talking in my head," He states that he walked down the street naked today.As per mother, she received a phone call from her niece stating that patient was walking down the street naked. Reports patient has been very paranoid thinking that his brother has a gun and is trying to kill him. States," He is forgetting things. Its like he has dementia."  Reports patient drove his car the other day, [place] it [place] church , walked home and could not remember where he parked it. Reports last week, patient packed all huis belongings and stated he was leaving. Reports she does not believe he would hurt himself intentionally although believes he is a danger to himself because of the psychotic state that he is in. Reports patient has been complaint with his medication although she does not think it is helping. He does have a history of drinking and smoking mariajuana. Patient denies VH, homicidal or suicidal ideations. Patient does have an ACTT team through [facility].   Total Time spent with patient: 15 minutes  Psychiatric Specialty Exam: Physical Exam  Vitals reviewed. Constitutional: He is oriented to person, place, and time.  Neurological: He is alert and oriented to person, place, and time.    ROS  Blood pressure 125/66, pulse (!) 126, temperature 99.1 F (37.3 C), temperature source Oral, resp. rate 16, SpO2 98 %.There is no height or weight on file to calculate BMI.  General Appearance: Casual  Eye Contact:  Good  Speech:  Clear and Coherent  and Normal Rate  Volume:  Decreased  Mood:  Anxious  Affect:  Congruent  Thought Process:  Coherent, Linear and Descriptions of Associations: Circumstantial  Orientation:  Full (Time, Place, and Person)  Thought Content:  Tangential  Suicidal Thoughts:  No  Homicidal Thoughts:  No  Memory:  Immediate;   Fair Recent;   Fair  Judgement:  Impaired  Insight:  Fair  Psychomotor Activity:  Normal  Concentration: Concentration: Fair and Attention Span: Fair  Recall:  [employer] of Knowledge:Fair  Language: Good  Akathisia:  Negative  Handed:  Right  AIMS (if indicated):     Assets:  Communication Skills Desire for Improvement Social Support  Sleep:       Musculoskeletal: Strength & Muscle Tone: within normal limits Gait & Station: normal Patient leans: N/A  Blood pressure 125/66, pulse (!) 126, temperature 99.1 F (37.3 C), temperature source Oral, resp. rate 16, SpO2 98 %.  Recommendations:  Based on my evaluation the patient does not appear to have an emergency medical condition.   Based on my evaluation, patient does meet inpatient psychiatric hospitalization. There are no available beds so [patient will be placed on the observation unit until an appropriate bed becomes available.   Mordecai Maes, NP 05/14/2019, 5:16 PM

## 2019-05-15 DIAGNOSIS — Z8249 Family history of ischemic heart disease and other diseases of the circulatory system: Secondary | ICD-10-CM | POA: Diagnosis not present

## 2019-05-15 DIAGNOSIS — Z915 Personal history of self-harm: Secondary | ICD-10-CM | POA: Diagnosis not present

## 2019-05-15 DIAGNOSIS — F419 Anxiety disorder, unspecified: Secondary | ICD-10-CM | POA: Diagnosis present

## 2019-05-15 DIAGNOSIS — Z9114 Patient's other noncompliance with medication regimen: Secondary | ICD-10-CM | POA: Diagnosis not present

## 2019-05-15 DIAGNOSIS — Z20828 Contact with and (suspected) exposure to other viral communicable diseases: Secondary | ICD-10-CM | POA: Diagnosis present

## 2019-05-15 DIAGNOSIS — F17210 Nicotine dependence, cigarettes, uncomplicated: Secondary | ICD-10-CM | POA: Diagnosis present

## 2019-05-15 DIAGNOSIS — F259 Schizoaffective disorder, unspecified: Secondary | ICD-10-CM | POA: Diagnosis present

## 2019-05-15 DIAGNOSIS — F23 Brief psychotic disorder: Secondary | ICD-10-CM | POA: Diagnosis not present

## 2019-05-15 DIAGNOSIS — R000 Tachycardia, unspecified: Secondary | ICD-10-CM | POA: Diagnosis not present

## 2019-05-15 DIAGNOSIS — Z79899 Other long term (current) drug therapy: Secondary | ICD-10-CM | POA: Diagnosis not present

## 2019-05-15 DIAGNOSIS — F250 Schizoaffective disorder, bipolar type: Secondary | ICD-10-CM | POA: Diagnosis present

## 2019-05-15 DIAGNOSIS — F1220 Cannabis dependence, uncomplicated: Secondary | ICD-10-CM | POA: Diagnosis present

## 2019-05-15 MED ORDER — HALOPERIDOL 5 MG PO TABS
20.0000 mg | ORAL_TABLET | Freq: Every day | ORAL | Status: DC
  Administered 2019-05-15 – 2019-05-19 (×5): 20 mg via ORAL
  Filled 2019-05-15 (×7): qty 4

## 2019-05-15 MED ORDER — HALOPERIDOL 5 MG PO TABS
10.0000 mg | ORAL_TABLET | Freq: Every day | ORAL | Status: DC
  Administered 2019-05-16 – 2019-05-20 (×5): 10 mg via ORAL
  Filled 2019-05-15 (×7): qty 2

## 2019-05-15 NOTE — Progress Notes (Signed)
Admission Note:  D:23 yr male who presents [facility] in no acute distress for the treatment of Anxiety and Depression from the observation unit. Pt appears flat and depressed. Pt was calm and cooperative with admission process. Pt stated he was depressed and anxious for the past few months, pt stated he was compliant with his medications.   A:Skin was assessed in observation unit.  POC and unit policies explained and understanding verbalized. Consents obtained.   R: Pt had no additional questions or concerns.

## 2019-05-15 NOTE — [facility] Counselor (Signed)
Adult Comprehensive Assessment  Patient ID: Marvin Crawford, male   DOB: 1995-10-04, 23 y.o.   MRN: [id number]  Information Source: Information source: Patient  Current Stressors:  Patient states their primary concerns and needs for treatment are:: I have anxiety Patient states their goals for this hospitilization and ongoing recovery are:: I want to get better and on the right meds. 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 Family Relationships: Pt reports family can be a stressor. Substance abuse: Pt denies substance use. Bereavement / Loss: Pt reports that his father passed in May 2020.  Living/Environment/Situation:  Living Arrangements: Parent Living conditions (as described by patient or guardian): "Good" Who else lives in the home?: Mom and brother How long has patient lived in current situation?: "Most of my life" What is atmosphere in current home: Loving, Supportive  Family History:  Marital status: Single Are you sexually active?: No What is your sexual orientation?: Gay Has your sexual activity been affected by drugs, alcohol, medication, or emotional stress?: No Does patient have children?: No  Childhood History:  By whom was/is the patient raised?: Mother Additional childhood history information: Pt reports that his father was living somewhere else. Description of patient's relationship with caregiver when they were a child: "It wasn't that good but it was good" Patient's description of current relationship with people who raised him/her: "Good still" How were you disciplined when you got in trouble as a child/adolescent?: Whooping, time out out, and taken away Does patient have siblings?: Yes Number of Siblings: 2 Description of patient's current relationship with siblings: "Good" Did patient suffer any verbal/emotional/physical/sexual abuse as a child?: No Did patient suffer from severe childhood  neglect?: No Has patient ever been sexually abused/assaulted/raped as an adolescent or adult?: No Was the patient ever a victim of a crime or a disaster?: No Witnessed domestic violence?: No Has patient been effected by domestic violence as an adult?: No  Education:  Highest grade of school patient has completed: 12th grade Currently a student?: No Learning disability?: Yes What learning problems does patient have?: IEP plan through the school system.  Employment/Work Situation:   Employment situation: Unemployed Patient's job has been impacted by current illness: No What is the longest time patient has a held a job?: 8 years Where was the patient employed at that time?: [employer] Did You Receive Any Psychiatric Treatment/Services While in the [employer]?: No Are There Guns or Other Weapons in [place]?: No  Financial Resources:   Museum/gallery curator resources: Support from parents / caregiver, Medicaid Does patient have a Programmer, applications or guardian?: No  Alcohol/Substance Abuse:   What has been your use of drugs/alcohol within the last 12 months?: Denied any If attempted suicide, did drugs/alcohol play a role in this?: No Has alcohol/substance abuse ever caused legal problems?: No  Social Support System:   Pensions consultant Support System: Good Describe Community Support System: Mom Type of faith/religion: Christian How does patient's faith help to cope with current illness?: "To stay focus on working"  Leisure/Recreation:   Leisure and Hobbies: Gardening and listening to music  Strengths/Needs:   What is the patient's perception of their strengths?: Working Patient states they can use these personal strengths during their treatment to contribute to their recovery: "Stop worry about friends and start worrying about work" Patient states these barriers may affect/interfere with their treatment: "I don't have any friends"  Discharge Plan:   Currently  receiving community  mental health services: No(Pt reports he is not. Pt stated he does not know [facility] when CSW inquired about his previous provider / hospitalization.) Patient states they will know when they are safe and ready for discharge when: I am ready. Does patient have access to transportation?: Yes(Mom) Does patient have financial barriers related to discharge medications?: No Will patient be returning to same living situation after discharge?: Yes  Summary/Recommendations:   Summary and Recommendations (to be completed by the evaluator): Patient is a 23 year old male admitted due to plans to overdose on his medication, reporting noncompliance since his discharge, reporting past suicide attempts in the chief complaint that "people were talking about him" past hospitalizations included recently here in 2020 of course past in 2018 and in 2016 at [facility]. Patient reports no current providers. Patient reports being open to follow up as recommended by the provider. Patient will benefit from crisis stabilization, medication evaluation, group therapy and psychoeducation, in addition to case management for discharge planning. At discharge it is recommended that Patient adhere to the established discharge plan and continue in treatment.  Shellia Cleverly. 05/15/2019

## 2019-05-15 NOTE — Progress Notes (Signed)
Recreation Therapy Notes  Date: 10.8.20 Time: 1000 Location: 400 Hall Dayroom   Group Topic: Leisure Education  Goal Area(s) Addresses:  Patient will identify positive leisure activities.  Patient will identify one positive benefit of participation in leisure activities.   Behavioral Response: Engaged  Intervention: Leisure Building control surveyor, Music  Activity: [place].  Patients and LRT were arranged in a circle.  Participants tossed the ball to each other until the music stopped.  Whoever was holding the ball when the music stopped, was out.  The game continued until there was a winner.    Education:  Leisure Education, Dentist  Education Outcome: Acknowledges education/In group clarification offered/Needs additional education  Clinical Observations/Feedback: Patient was flat but engaged.  Pt was active during activity.  Pt attempted to make a phone call during group but was able to be redirected.  Pt left group early but later returned.     [name], LRT/CTRS     Ria Comment, Trianna Lupien A 05/15/2019 11:11 AM

## 2019-05-15 NOTE — Tx Team (Signed)
Initial Treatment Plan 05/15/2019 6:42 AM RIYAD KEENA AXE:[id number]    PATIENT STRESSORS: Marital or family conflict Medication change or noncompliance   PATIENT STRENGTHS: General fund of knowledge Motivation for treatment/growth   PATIENT IDENTIFIED PROBLEMS: depression  anxiety  "get my life together"                 DISCHARGE CRITERIA:  Improved stabilization in mood, thinking, and/or behavior Verbal commitment to aftercare and medication compliance  PRELIMINARY DISCHARGE PLAN: Attend PHP/IOP Outpatient therapy  PATIENT/FAMILY INVOLVEMENT: This treatment plan has been presented to and reviewed with the patient, ARJUNA DOEDEN.  The patient and family have been given the opportunity to ask questions and make suggestions.  Providence Crosby, RN 05/15/2019, 6:42 AM

## 2019-05-15 NOTE — H&P (Signed)
Psychiatric Admission Assessment Adult  Patient Identification: Marvin Crawford MRN:  [id number] Date of Evaluation:  05/15/2019 Chief Complaint:  anxiety Principal Diagnosis: Exacerbation of underlying psychotic disorder Diagnosis:  Active Problems:   Cannabis use disorder, moderate, dependence (HCC)   Schizoaffective disorder (HCC)   Schizophrenia, acute (HCC)  History of Present Illness:  This is a repeat admission for [name], a 23 year old patient known to the service with a chronic psychotic disorder, last admitted here on 8/27.  He re-presented on 9/5 as a walk-in to behavioral health with plans to overdose on his medication, reporting noncompliance since his discharge, reporting past suicide attempts in the chief complaint that "people were talking about him" past hospitalizations included recently here in 2020 of course past in 2018 and in 2016 at [facility].  Currently lives with family members.  Since admission the patient is been singularly focused on discharge approaching me while I am trying around another patient's, knocking on the door and requesting to go home.  He does however acknowledge that deny auditory hallucinations.  Denies thoughts of harming self or others.  This is a pretty typical presentation for him to be generally all over the map as far as his reporting of symptoms.  He is flat in affect has no involuntary movements. Drug screen negative for all compounds tested  According to the nurse practitioner history of yesterday  Marvin Crawford is an 23 y.o. male.who presents to [facility] as a walk-in, voluntarily, accompanied with his mother. Patient has a history of schizoaffective disorder. He  was discharged from [facility] on 04-07-2019, and seen again on 04/12/2019. It appears that he was psychiatrically cleared and discharged 04/13/2019. When asked for his reason for admission patient stated," I have been seeing things in black and white. People are talking in my head,"  He states that he walked down the street naked today.As per mother, she received a phone call from her niece stating that patient was walking down the street naked. Reports patient has been very paranoid thinking that his brother has a gun and is trying to kill him. States," He is forgetting things. Its like he has dementia."  Reports patient drove his car the other day, parked it at a church , walked home and could not remember where he parked it. Reports last week, patient packed all huis belongings and stated he was leaving. Reports she does not believe he would hurt himself intentionally although believes he is a danger to himself because of the psychotic state that he is in. Reports patient has been complaint with his medication although she does not think it is helping. He does have a history of drinking and smoking mariajuana. Patient denies VH, homicidal or suicidal ideations. Patient does have an ACTT team through [employer].     Associated Signs/Symptoms: Depression Symptoms:  anhedonia, (Hypo) Manic Symptoms:  Hallucinations, Anxiety Symptoms:  Social Anxiety, Psychotic Symptoms:  Hallucinations: Auditory PTSD Symptoms: NA Total Time spent with patient: 45 minutes  Past Psychiatric History: Past admissions [facility] and here  Is the patient at risk to self? Yes.    Has the patient been a risk to self in the past 6 months? Yes.    Has the patient been a risk to self within the distant past? Yes.    Is the patient a risk to others? Yes.    Has the patient been a risk to others in the past 6 months? Yes.    Has the  patient been a risk to others within the distant past? Yes.     Prior Inpatient Therapy: Prior Inpatient Therapy: Yes Prior Therapy Dates: 2016, 2018, 2020 Prior Therapy Facilty/Provider(s): [facility] Reason for Treatment: Psychosis Prior Outpatient Therapy: Prior Outpatient Therapy: Yes Prior Therapy Dates: Ongoing  Prior Therapy Facilty[facility]Provider[facility]):  [facility]  Reason for Treatment: ACTT Services  Does patient have an ACCT team?: Yes Does patient have Intensive In-House Services?  : No Does patient have [facility] services? : No Does patient have P4CC services?: No  Alcohol Screening: 1. How often do you have a drink containing alcohol?: Never 2. How many drinks containing alcohol do you have on a typical day when you are drinking?: 1 or 2 3. How often do you have six or more drinks on one occasion?: Never AUDIT-C Score: 0 4. How often during the last year have you found that you were not able to stop drinking once you had started?: Never 5. How often during the last year have you failed to do what was normally expected from you becasue of drinking?: Never 6. How often during the last year have you needed a first drink in the morning to get yourself going after a heavy drinking session?: Never 7. How often during the last year have you had a feeling of guilt of remorse after drinking?: Never 8. How often during the last year have you been unable to remember what happened the night before because you had been drinking?: Never 9. Have you or someone else been injured as a result of your drinking?: No 10. Has a relative or friend or a doctor or another health worker been concerned about your drinking or suggested you cut down?: No Alcohol Use Disorder Identification Test Final Score (AUDIT): 0 Alcohol Brief Interventions/Follow-up: AUDIT Score <7 follow-up not indicated Substance Abuse History in the last 12 months:  Yes.   Consequences of Substance Abuse: NA Previous Psychotropic Medications: Yes  Psychological Evaluations: No  Past Medical History:  Past Medical History:  Diagnosis Date  . ADD (attention deficit disorder)   . Anxiety   . Depression   . Environmental allergies   . Schizo-affective psychosis (HCC)    History reviewed. No pertinent surgical history. Family History:  Family History  Problem Relation Age of Onset   . Hypertension Mother   . Hypertension Father   . Mental illness Neg Hx    Family Psychiatric  History: Unknown to patient Tobacco Screening: Have you used any form of tobacco in the last 30 days? (Cigarettes, Smokeless Tobacco, Cigars, and/or Pipes): No Social History:  Social History   Substance and Sexual Activity  Alcohol Use Yes  . Alcohol/week: 1.0 standard drinks  . Types: 1 Standard drinks or equivalent per week   Comment: "once in a blue moon"     Social History   Substance and Sexual Activity  Drug Use Yes  . Types: Other-see comments, Marijuana   Comment: oil smoked in pipe calls "DAB", friends buy for him    Additional Social History: Marital status: Single    Pain Medications: None reported Prescriptions: Abilify  qd; Lexapro 10 mg qd; Lamisil History of alcohol / drug use?: No history of alcohol / drug abuse                    Allergies:  No Known Allergies Lab Results:  Results for orders placed or performed during the hospital encounter of 05/14/19 (from the past 48 hour(s))  SARS Coronavirus 2 [facility](Hospital order, Performed in [facility] hospital lab) Nasopharyngeal Nasopharyngeal Swab     Status: None   Collection Time: 05/14/19  7:20 PM   Specimen: Nasopharyngeal Swab  Result Value Ref Range   SARS Coronavirus 2 NEGATIVE NEGATIVE    Comment: (NOTE) If result is NEGATIVE SARS-CoV-2 target nucleic acids are NOT DETECTED. The SARS-CoV-2 RNA is generally detectable in upper and lower  respiratory specimens during the acute phase of infection. The lowest  concentration of SARS-CoV-2 viral copies this assay can detect is 250  copies / mL. A negative result does not preclude SARS-CoV-2 infection  and should not be used as the sole basis for treatment or other  patient management decisions.  A negative result may occur with  improper specimen collection / handling, submission of specimen other  than nasopharyngeal swab, presence of viral  mutation(s) within the  areas targeted by this assay, and inadequate number of viral copies  (<250 copies / mL). A negative result must be combined with clinical  observations, patient history, and epidemiological information. If result is POSITIVE SARS-CoV-2 target nucleic acids are DETECTED. The SARS-CoV-2 RNA is generally detectable in upper and lower  respiratory specimens dur ing the acute phase of infection.  Positive  results are indicative of active infection with SARS-CoV-2.  Clinical  correlation with patient history and other diagnostic information is  necessary to determine patient infection status.  Positive results do  not rule out bacterial infection or co-infection with other viruses. If result is PRESUMPTIVE POSTIVE SARS-CoV-2 nucleic acids MAY BE PRESENT.   A presumptive positive result was obtained on the submitted specimen  and confirmed on repeat testing.  While 2019 novel coronavirus  (SARS-CoV-2) nucleic acids may be present in the submitted sample  additional confirmatory testing may be necessary for epidemiological  and / or clinical management purposes  to differentiate between  SARS-CoV-2 and other Sarbecovirus currently known to infect humans.  If clinically indicated additional testing with an alternate test  methodology [phone]([id number]) is advised. The SARS-CoV-2 RNA is generally  detectable in upper and lower respiratory sp ecimens during the acute  phase of infection. The expected result is Negative. Fact Sheet for Patients:  BoilerBrush.com.cyhttps://www.fda.gov/media/136312/download Fact Sheet for Healthcare Providers: https://pope.com/https://www.fda.gov/media/136313/download This test is not yet approved or cleared by the [place] FDA and has been authorized for detection and/or diagnosis of SARS-CoV-2 by FDA under an Emergency Use Authorization (EUA).  This EUA will remain in effect (meaning this test can be used) for the duration of the COVID-19 declaration under Section 564(b)(1)  of the Act, 21 U.S.C. section 360bbb-3(b)(1), unless the authorization is terminated or revoked sooner. Performed at [facility], [address] [place], [place] [address]     Blood Alcohol level:  Lab Results  Component Value Date   [facility]   ETH <10 08/17/2017    Metabolic Disorder Labs:  Lab Results  Component Value Date   HGBA1C 5.9 (H) 10/15/2018   MPG 122.63 10/15/2018   MPG 102.54 05/10/2017   Lab Results  Component Value Date   PROLACTIN 3.4 (L) 10/15/2018   PROLACTIN 6.5 05/10/2017   Lab Results  Component Value Date   CHOL 138 10/15/2018   TRIG 46 10/15/2018   HDL 29 (L) 10/15/2018   CHOLHDL 4.8 10/15/2018   VLDL 9 10/15/2018   LDLCALC 100 (H) 10/15/2018   LDLCALC 79 05/10/2017    Current Medications: Current Facility-Administered Medications  Medication Dose Route Frequency  Provider Last Rate Last Dose  . benztropine (COGENTIN) tablet 1 mg  1 mg Oral BID Mordecai Maes, NP   1 mg at 05/15/19 0817  . haloperidol (HALDOL) tablet 10 mg  10 mg Oral BID Mordecai Maes, NP   10 mg at 05/15/19 0817  . traZODone (DESYREL) tablet 100 mg  100 mg Oral QHS PRN Mordecai Maes, NP   100 mg at 05/14/19 2215   PTA Medications: Medications Prior to Admission  Medication Sig Dispense Refill Last Dose  . benztropine (COGENTIN) 1 MG tablet Take 1 tablet (1 mg total) by mouth 2 (two) times daily. 60 tablet 2   . DESCOVY 200-25 MG tablet Take 1 tablet by mouth daily.     . haloperidol (HALDOL) 10 MG tablet 1 in a m 2 at h s 90 tablet 1   . traZODone (DESYREL) 100 MG tablet Take 1 tablet (100 mg total) by mouth at bedtime as needed for sleep. 90 tablet 1     Musculoskeletal: Strength & Muscle Tone: within normal limits Gait & Station: normal Patient leans: N/A  Psychiatric Specialty Exam: Physical Exam  Nursing note and vitals reviewed. Constitutional: He appears well-developed and well-nourished.  Cardiovascular: Normal rate  and regular rhythm.    Review of Systems  Constitutional: Negative.   Eyes: Negative.   Cardiovascular: Negative.   Gastrointestinal: Negative.   Genitourinary: Negative.   Skin: Negative.   Neurological: Negative.   Endo/Heme/Allergies: Negative.     Blood pressure 121/73, pulse (!) 114, temperature 98.6 F (37 C), temperature source Oral, resp. rate 20, height 6\' 2"  (1.88 m), weight 107.5 kg, SpO2 100 %.Body mass index is 30.43 kg/m.  General Appearance: Casual  Eye Contact:  Good  Speech:  Clear and Coherent and Slow  Volume:  Decreased  Mood:  Dysphoric  Affect:  Blunt  Thought Process:  Irrelevant and Descriptions of Associations: Loose  Orientation:  Full (Time, Place, and Person)  Thought Content:  Hallucinations: Auditory and Tangential  Suicidal Thoughts:  No  Homicidal Thoughts:  No  Memory:  Immediate;   Fair Recent;   Fair Remote;   Fair  Judgement:  Intact  Insight:  Shallow  Psychomotor Activity:  Decreased  Concentration:  Concentration: Fair and Attention Span: Fair  Recall:  Good  Fund of Knowledge:  Fair  Language:  Fair  Akathisia:  Negative  Handed:  Right  AIMS (if indicated):     Assets:  Communication Skills Leisure Time Physical Health  ADL's:  Intact  Cognition:  WNL  Sleep:       Treatment Plan Summary: Daily contact with patient to assess and evaluate symptoms and progress in treatment and Medication management  Observation Level/Precautions:  15 minute checks  Laboratory:  UDS  Psychotherapy: Reality based  Medications: Resume Haldol escalate dose discussed long-acting  Consultations: None necessary  Discharge Concerns: Longer term stability discussed long-acting injectables  Estimated LOS: 5-7  Other: Axis I schizophrenia acute exacerbation versus schizoaffective   Physician Treatment Plan for Primary Diagnosis: Begin antipsychotic therapy Long Term Goal(s): Improvement in symptoms so as ready for discharge  Short Term Goals:  Ability to maintain clinical measurements within normal limits will improve, Compliance with prescribed medications will improve and Ability to identify triggers associated with substance abuse/mental health issues will improve  Physician Treatment Plan for Secondary Diagnosis: Active Problems:   Cannabis use disorder, moderate, dependence (HCC)   Schizoaffective disorder (HCC)   Schizophrenia, acute ([place])  Long Term Goal(s): Improvement in  symptoms so as ready for discharge  Short Term Goals: Ability to identify and develop effective coping behaviors will improve, Ability to maintain clinical measurements within normal limits will improve, Compliance with prescribed medications will improve and Ability to identify triggers associated with substance abuse/mental health issues will improve  I certify that inpatient services furnished can reasonably be expected to improve the patient's condition.    Malvin Johns, MD 10/8/202010:48 AM

## 2019-05-15 NOTE — Progress Notes (Signed)
Recreation Therapy Notes  INPATIENT RECREATION THERAPY ASSESSMENT  Patient Details Name: BRECK MARYLAND MRN: [id number] DOB: 10-15-1995 Today's Date: 05/15/2019       Information Obtained From: Patient  Able to Participate in Assessment/Interview: Yes  Patient Presentation: Alert  Reason for Admission (Per Patient): Other (Comments)(Pt stated he was here for depression but came back later and stated he needed to get the right medicine.)  Patient Stressors: (None identified)  Coping Skills:   (None identified)  Leisure Interests (2+):  Exercise - Walking  Frequency of Recreation/Participation: Other (Comment)(Daily)  Awareness of Community Resources:  Yes  Community Resources:  Park  Current Use: Yes  If no, Barriers?:    Expressed Interest in [place]: No  County of Residence:  [place]  Patient Main Form of Transportation: Walk  Patient Strengths:  Careers adviser  Patient Identified Areas of Improvement:  Reading  Patient Goal for Hospitalization:  "get better"  Current SI (including self-harm):  No  Current HI:  No  Current AVH: No  Staff Intervention Plan: Group Attendance, Collaborate with Interdisciplinary Treatment Team  Consent to Intern Participation: N/A    [name], LRT/CTRS  Ria Comment, Lynzie Cliburn A 05/15/2019, 11:27 AM

## 2019-05-15 NOTE — BHH Suicide Risk Assessment (Signed)
[facility] INPATIENT:  Family/Significant Other Suicide Prevention Education  Suicide Prevention Education:  Education Completed; mother, Marvin Crawford [phone]  has been identified by the patient as the family member/significant other with whom the patient will be residing, and identified as the person(s) who will aid the patient in the event of a mental health crisis (suicidal ideations/suicide attempt).  With written consent from the patient, the family member/significant other has been provided the following suicide prevention education, prior to the and/or following the discharge of the patient.  The suicide prevention education provided includes the following:  Suicide risk factors  Suicide prevention and interventions  National Suicide Hotline telephone number  [facility] assessment telephone number  [facility] Emergency Assistance [place] and/or Residential Mobile Crisis Unit telephone number  Request made of family/significant other to:  Remove weapons (e.g., guns, rifles, knives), all items previously/currently identified as safety concern.    Remove drugs/medications (over-the-counter, prescriptions, illicit drugs), all items previously/currently identified as a safety concern.  The family member/significant other verbalizes understanding of the suicide prevention education information provided.  The family member/significant other agrees to remove the items of safety concern listed above.     Mother states she expressed her concerns to the NP during patient's initial intake assessment: He states that he walked down the street naked today.As per mother, she received a phone call from her niece stating that patient was walking down the street naked. Reports patient has been very paranoid thinking that his brother has a gun and is trying to kill him. States," He is forgetting things. Its like he has dementia." Reports patient drove his car the other  day, parked it at a church , walked home and could not remember where he parked it. Reports last week, patient packed all his belongings and stated he was leaving. Reports she does not believe he would hurt himself intentionally although believes he is a danger to himself because of the psychotic state that he is in. Reports patient has been complaint with his medication although she does not think it is helping. He does have a history of drinking and smoking mariajuana. Patient denies VH, homicidal or suicidal ideations. Patient does have an ACTT team through [employer].    Mother confirms that patient is compliant with medications, but she thinks they need to be adjusted. She confirms that the patient receives ACTT services through [employer].  At this time, mother does not have additional concerns or specific questions. She is optimistic about medication stabilization. She is aware the patient is eager to discharge but knows medication adjustments and monitoring will take a few days.  Joellen Jersey 05/15/2019, 12:18 PM

## 2019-05-15 NOTE — Progress Notes (Signed)
Patient ID: Marvin Crawford, male   DOB: 1996/04/13, 23 y.o.   MRN: [id number]  Admitted to inpatient

## 2019-05-15 NOTE — Progress Notes (Signed)
Patient approached CSW on unit requesting discharge or the option to sign a 72 hour voluntary admission discharge form. CSW reminded patient that he just arrived to the unit and he may need time to re-start his medications. Patient verbalized understanding and asked CSW to pass on request to psychiatry.  Stephanie Acre, LCSW-A Clinical Social Worker

## 2019-05-15 NOTE — BHH Suicide Risk Assessment (Signed)
[facility] Admission Suicide Risk Assessment   Nursing information obtained from:  Patient, Review of record Demographic factors:  Male, Adolescent or young adult Current Mental Status:  Suicidal ideation indicated by patient Loss Factors:  NA Historical Factors:  Prior suicide attempts, Impulsivity Risk Reduction Factors:  Positive social support, Positive therapeutic relationship, Sense of responsibility to family, Living with another person, especially a relative, Positive coping skills or problem solving skills  Total Time spent with patient: 45 minutes Principal Problem: <principal problem not specified> Diagnosis:  Active Problems:   Cannabis use disorder, moderate, dependence ([place])   Schizoaffective disorder (HCC)   Schizophrenia, acute ([place])  Subjective Data: Readmission due to psychosis and noncompliance  Continued Clinical Symptoms:  Alcohol Use Disorder Identification Test Final Score (AUDIT): 0 The "Alcohol Use Disorders Identification Test", Guidelines for Use in Primary Care, Second Edition.  World Pharmacologist [facility]). Score between 0-7:  no or low risk or alcohol related problems. Score between 8-15:  moderate risk of alcohol related problems. Score between 16-19:  high risk of alcohol related problems. Score 20 or above:  warrants further diagnostic evaluation for alcohol dependence and treatment.   CLINICAL FACTORS:   Schizophrenia:   Paranoid or undifferentiated type Musculoskeletal: Strength & Muscle Tone: within normal limits Gait & Station: normal Patient leans: N/A  Psychiatric Specialty Exam: Physical Exam  Nursing note and vitals reviewed. Constitutional: He appears well-developed and well-nourished.  Cardiovascular: Normal rate and regular rhythm.    Review of Systems  Constitutional: Negative.   Eyes: Negative.   Cardiovascular: Negative.   Gastrointestinal: Negative.   Genitourinary: Negative.   Skin: Negative.   Neurological: Negative.    Endo/Heme/Allergies: Negative.     Blood pressure 121/73, pulse (!) 114, temperature 98.6 F (37 C), temperature source Oral, resp. rate 20, height 6\' 2"  (1.88 m), weight 107.5 kg, SpO2 100 %.Body mass index is 30.43 kg/m.  General Appearance: Casual  Eye Contact:  Good  Speech:  Clear and Coherent and Slow  Volume:  Decreased  Mood:  Dysphoric  Affect:  Blunt  Thought Process:  Irrelevant and Descriptions of Associations: Loose  Orientation:  Full (Time, Place, and Person)  Thought Content:  Hallucinations: Auditory and Tangential  Suicidal Thoughts:  No  Homicidal Thoughts:  No  Memory:  Immediate;   Fair Recent;   Fair Remote;   Fair  Judgement:  Intact  Insight:  Shallow  Psychomotor Activity:  Decreased  Concentration:  Concentration: Fair and Attention Span: Fair  Recall:  Good  Fund of Knowledge:  Fair  Language:  Fair  Akathisia:  Negative  Handed:  Right  AIMS (if indicated):     Assets:  Communication Skills Leisure Time Physical Health  ADL's:  Intact  Cognition:  WNL  Sleep:        COGNITIVE FEATURES THAT CONTRIBUTE TO RISK:  Polarized thinking    SUICIDE RISK:   Minimal: No identifiable suicidal ideation.  Patients presenting with no risk factors but with morbid ruminations; may be classified as minimal risk based on the severity of the depressive symptoms  PLAN OF CARE: see eval  I certify that inpatient services furnished can reasonably be expected to improve the patient's condition.   Johnn Hai, MD 05/15/2019, 10:53 AM

## 2019-05-16 LAB — COMPREHENSIVE METABOLIC PANEL
ALT: 36 U/L (ref 0–44)
AST: 32 U/L (ref 15–41)
Albumin: 4.1 g/dL (ref 3.5–5.0)
Alkaline Phosphatase: 60 U/L (ref 38–126)
Anion gap: 11 (ref 5–15)
BUN: 10 mg/dL (ref 6–20)
CO2: 27 mmol/L (ref 22–32)
Calcium: 10 mg/dL (ref 8.9–10.3)
Chloride: 103 mmol/L (ref 98–111)
Creatinine, Ser: 1.02 mg/dL (ref 0.61–1.24)
GFR calc Af Amer: >60 mL/min (ref >60)
GFR calc non Af Amer: >60 mL/min (ref >60)
Glucose, Bld: 103 mg/dL — ABNORMAL HIGH (ref 70–99)
Potassium: 3.9 mmol/L (ref 3.5–5.1)
Sodium: 141 mmol/L (ref 135–145)
Total Bilirubin: 0.7 mg/dL (ref 0.3–1.2)
Total Protein: 6.8 g/dL (ref 6.5–8.1)

## 2019-05-16 LAB — CBC
HCT: 40.3 % (ref 39.0–52.0)
Hemoglobin: 13.5 g/dL (ref 13.0–17.0)
MCH: 29 pg (ref 26.0–34.0)
MCHC: 33.5 g/dL (ref 30.0–36.0)
MCV: 86.7 fL (ref 80.0–100.0)
Platelets: 237 10*3/uL (ref 150–400)
RBC: 4.65 MIL/uL (ref 4.22–5.81)
RDW: 13 % (ref 11.5–15.5)
WBC: 5.1 10*3/uL (ref 4.0–10.5)
nRBC: 0 % (ref 0.0–0.2)

## 2019-05-16 LAB — TSH: TSH: 1.066 u[IU]/mL (ref 0.350–4.500)

## 2019-05-16 LAB — LIPID PANEL
Cholesterol: 145 mg/dL (ref 0–200)
HDL: 38 mg/dL — ABNORMAL LOW (ref >40)
LDL Cholesterol: 98 mg/dL (ref 0–99)
Total CHOL/HDL Ratio: 3.8 RATIO
Triglycerides: 47 mg/dL (ref <150)
VLDL: 9 mg/dL (ref 0–40)

## 2019-05-16 LAB — HEMOGLOBIN A1C
Hgb A1c MFr Bld: 5.5 % (ref 4.8–5.6)
Mean Plasma Glucose: 111.15 mg/dL

## 2019-05-16 NOTE — Progress Notes (Signed)
Pt signed 72 hr request D/C 10/9 2245

## 2019-05-16 NOTE — Progress Notes (Signed)
D: Pt paranoid and anxious on the unit this evening.   A: Pt was offered support and encouragement. Pt was given scheduled medications. Pt was encourage to attend groups. Q 15 minute checks were done for safety.   R: safety maintained on unit.

## 2019-05-16 NOTE — Progress Notes (Signed)
Recreation Therapy Notes  Date: 10.9.20 Time: 1000 Location: [address] Dayroom  Group Topic: Goal Setting  Goal Area(s) Addresses:  Patient will be able to identify at least 3 life goals.  Patient will be able to identify benefit of investing in life goals.  Patient will be able to identify benefit of setting life goals.   Intervention:  Worksheet  Activity: Goal Planning.  Patients were to identify what they wanted to accomplish in a week, month, year and 5 years.  Patients would then identify any obstacles they may face, what they need to achieve goals and what they can start doing now to work towards goals.  Education:  Discharge Planning, Coping Skills, Leisure Education   Education Outcome: Acknowledges Education/In Group Clarification Provided/Needs Additional Education  Clinical Observations:  Pt did not attend group.    Aubreigh Fuerte, LRT/CTRS         Raquel Sayres A 05/16/2019 11:34 AM 

## 2019-05-16 NOTE — Tx Team (Signed)
Interdisciplinary Treatment and Diagnostic Plan Update  05/16/2019 Time of Session: 10:05 am  DRAPER Crawford MRN: [id number]  Principal Diagnosis: <principal problem not specified>  Secondary Diagnoses: Active Problems:   Cannabis use disorder, moderate, dependence (HCC)   Schizoaffective disorder (HCC)   Schizophrenia, acute (HCC)   Current Medications:  Current Facility-Administered Medications  Medication Dose Route Frequency Provider Last Rate Last Dose  . benztropine (COGENTIN) tablet 1 mg  1 mg Oral BID Denzil Magnuson, NP   1 mg at 05/16/19 0815  . haloperidol (HALDOL) tablet 10 mg  10 mg Oral Daily Malvin Johns, MD   10 mg at 05/16/19 0815  . haloperidol (HALDOL) tablet 20 mg  20 mg Oral QHS Malvin Johns, MD   20 mg at 05/15/19 2125  . traZODone (DESYREL) tablet 100 mg  100 mg Oral QHS PRN Denzil Magnuson, NP   100 mg at 05/15/19 2125   PTA Medications: Medications Prior to Admission  Medication Sig Dispense Refill Last Dose  . benztropine (COGENTIN) 1 MG tablet Take 1 tablet (1 mg total) by mouth 2 (two) times daily. 60 tablet 2   . DESCOVY 200-25 MG tablet Take 1 tablet by mouth daily.     . haloperidol (HALDOL) 10 MG tablet 1 in a m 2 at h s 90 tablet 1   . traZODone (DESYREL) 100 MG tablet Take 1 tablet (100 mg total) by mouth at bedtime as needed for sleep. 90 tablet 1     Patient Stressors: Marital or family conflict Medication change or noncompliance  Patient Strengths: Geographical information systems officer for treatment/growth  Treatment Modalities: Medication Management, Group therapy, Case management,  1 to 1 session with clinician, Psychoeducation, Recreational therapy.   Physician Treatment Plan for Primary Diagnosis: <principal problem not specified> Long Term Goal(s): Improvement in symptoms so as ready for discharge Improvement in symptoms so as ready for discharge   Short Term Goals: Ability to maintain clinical measurements within normal limits  will improve Compliance with prescribed medications will improve Ability to identify triggers associated with substance abuse/mental health issues will improve Ability to identify and develop effective coping behaviors will improve Ability to maintain clinical measurements within normal limits will improve Compliance with prescribed medications will improve Ability to identify triggers associated with substance abuse/mental health issues will improve  Medication Management: Evaluate patient's response, side effects, and tolerance of medication regimen.  Therapeutic Interventions: 1 to 1 sessions, Unit Group sessions and Medication administration.  Evaluation of Outcomes: Progressing  Physician Treatment Plan for Secondary Diagnosis: Active Problems:   Cannabis use disorder, moderate, dependence (HCC)   Schizoaffective disorder (HCC)   Schizophrenia, acute (HCC)  Long Term Goal(s): Improvement in symptoms so as ready for discharge Improvement in symptoms so as ready for discharge   Short Term Goals: Ability to maintain clinical measurements within normal limits will improve Compliance with prescribed medications will improve Ability to identify triggers associated with substance abuse/mental health issues will improve Ability to identify and develop effective coping behaviors will improve Ability to maintain clinical measurements within normal limits will improve Compliance with prescribed medications will improve Ability to identify triggers associated with substance abuse/mental health issues will improve     Medication Management: Evaluate patient's response, side effects, and tolerance of medication regimen.  Therapeutic Interventions: 1 to 1 sessions, Unit Group sessions and Medication administration.  Evaluation of Outcomes: Progressing   RN Treatment Plan for Primary Diagnosis: <principal problem not specified> Long Term Goal(s): Knowledge of disease and  therapeutic  regimen to maintain health will improve  Short Term Goals: Ability to participate in decision making will improve, Ability to verbalize feelings will improve, Ability to disclose and discuss suicidal ideas, Ability to identify and develop effective coping behaviors will improve and Compliance with prescribed medications will improve  Medication Management: RN will administer medications as ordered by provider, will assess and evaluate patient's response and provide education to patient for prescribed medication. RN will report any adverse and/or side effects to prescribing provider.  Therapeutic Interventions: 1 on 1 counseling sessions, Psychoeducation, Medication administration, Evaluate responses to treatment, Monitor vital signs and CBGs as ordered, Perform/monitor CIWA, COWS, AIMS and Fall Risk screenings as ordered, Perform wound care treatments as ordered.  Evaluation of Outcomes: Progressing   LCSW Treatment Plan for Primary Diagnosis: <principal problem not specified> Long Term Goal(s): Safe transition to appropriate next level of care at discharge, Engage patient in therapeutic group addressing interpersonal concerns.  Short Term Goals: Engage patient in aftercare planning with referrals and resources and Increase skills for wellness and recovery  Therapeutic Interventions: Assess for all discharge needs, 1 to 1 time with Social worker, Explore available resources and support systems, Assess for adequacy in community support network, Educate family and significant other(s) on suicide prevention, Complete Psychosocial Assessment, Interpersonal group therapy.  Evaluation of Outcomes: Progressing   Progress in Treatment: Attending groups: No. Participating in groups: No. Taking medication as prescribed: Yes. Toleration medication: Yes. Family/Significant other contact made: Yes, individual(s) contacted:  pt's mother  Patient understands diagnosis: Yes. Discussing patient identified  problems/goals with staff: Yes. Medical problems stabilized or resolved: Yes. Denies suicidal/homicidal ideation: Yes. Issues/concerns per patient self-inventory: No. Other:   New problem(s) identified:   New Short Term/Long Term Goal(s): Medication stabilization, elimination of SI thoughts, and development of a comprehensive mental wellness plan.   Patient Goals:  "go home"  Discharge Plan or Barriers: Pt will go home and Top Priority for therapy   Reason for Continuation of Hospitalization: Anxiety Depression Hallucinations Suicidal ideation  Estimated Length of Stay: 3-5 days   Attendees: Patient: Marvin Crawford 05/16/2019 11:12 AM  Physician: Dr. Jake Samples, MD 05/16/2019 11:12 AM  Nursing: Vladimir Faster, RN 05/16/2019 11:12 AM  RN Care Manager: 05/16/2019 11:12 AM  Social Worker: Solvang, Marlinda Mike 05/16/2019 11:12 AM  Recreational Therapist:  05/16/2019 11:12 AM  Other: Ovidio Kin, MSW intern 05/16/2019 11:12 AM  Other:  05/16/2019 11:12 AM  Other: 05/16/2019 11:12 AM    Scribe for Treatment Team: Billey Chang, [place] Work 05/16/2019 11:19 AM
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
## 2019-05-17 DIAGNOSIS — F259 Schizoaffective disorder, unspecified: Secondary | ICD-10-CM

## 2019-05-17 DIAGNOSIS — F1220 Cannabis dependence, uncomplicated: Secondary | ICD-10-CM

## 2019-05-17 LAB — PROLACTIN: Prolactin: 2.2 ng/mL — ABNORMAL LOW (ref 4.0–15.2)

## 2019-05-17 NOTE — Plan of Care (Signed)
  Problem: Education: Goal: Verbalization of understanding the information provided will improve Outcome: Progressing   Problem: Activity: Goal: Interest or engagement in activities will improve Outcome: Progressing   

## 2019-05-17 NOTE — Progress Notes (Signed)
Adult Psychoeducational Group Note  Date:  05/17/2019 Time:  3:53 AM  Group Topic/Focus:  Wrap-Up Group:   The focus of this group is to help patients review their daily goal of treatment and discuss progress on daily workbooks.  Participation Level:  Minimal  Participation Quality:  Appropriate  Affect:  Appropriate  Cognitive:  Disorganized, Confused and Lacking  Insight: Lacking, Limited and None  Engagement in Group:  Lacking, Limited, None, Poor and Resistant  Modes of Intervention:  Discussion  Additional Comments:  Pt stated his goal for today was to focus on his treatment plan. Pt stated he felt he accomplished his goal today.   Pt stated his relationship with his family has improved since he was admitted here. Pt stated contacting his mother today help improve his day. Pt stated he felt better about himself today. Pt rated his overall day a 10. Pt stated his appetite was pretty good today. Pt stated his goal for tonight was to get some rest. Pt did not complain of pain tonight. Pt stated he was not hearing or seeing anything that was not there.  Pt stated he had no thoughts of harming himself or others.  Pt stated he would alert staff if anything changes.  Candy Sledge 05/17/2019, 3:53 AM

## 2019-05-17 NOTE — Progress Notes (Signed)
Adult Psychoeducational Group Note  Date:  05/17/2019 Time:  9:58 PM  Group Topic/Focus:  Wrap-Up Group:   The focus of this group is to help patients review their daily goal of treatment and discuss progress on daily workbooks.  Participation Level:  Active  Participation Quality:  Appropriate  Affect:  Appropriate  Cognitive:  Appropriate  Insight: Appropriate  Engagement in Group:  Developing/Improving  Modes of Intervention:  Discussion  Additional Comments:  Pt stated his goal for today was to focus on his treatment plan. Pt stated he felt he accomplished his goal today.   Pt stated his relationship with his family has improved since he was admitted here. Pt stated contacting his mother today help improve his day. Pt stated he felt better about himself today. Pt rated his overall day a 10. Pt stated his appetite was pretty good today. Pt stated his goal for tonight was to get some rest. Pt did not complain of pain tonight. Pt stated he was not hearing or seeing anything that was not there.  Pt stated he had no thoughts of harming himself or others.  Pt stated he would alert staff if anything changes.  Candy Sledge 05/17/2019, 9:58 PM

## 2019-05-17 NOTE — Progress Notes (Signed)
Patient ID: Marvin Crawford, male   DOB: [birth year], 23 y.o.   MRN: [id number]   [place] NOVEL CORONAVIRUS (COVID-19) DAILY CHECK-OFF SYMPTOMS - answer yes or no to each - every day NO YES  Have you had a fever in the past 24 hours?  . Fever (Temp > 37.80C / 100F) X   Have you had any of these symptoms in the past 24 hours? . New Cough .  Sore Throat  .  Shortness of Breath .  Difficulty Breathing .  Unexplained Body Aches   X   Have you had any one of these symptoms in the past 24 hours not related to allergies?   . Runny Nose .  Nasal Congestion .  Sneezing   X   If you have had runny nose, nasal congestion, sneezing in the past 24 hours, has it worsened?  X   EXPOSURES - check yes or no X   Have you traveled outside the state in the past 14 days?  X   Have you been in contact with someone with a confirmed diagnosis of COVID-19 or PUI in the past 14 days without wearing appropriate PPE?  X   Have you been living in the same home as a person with confirmed diagnosis of COVID-19 or a PUI (household contact)?    X   Have you been diagnosed with COVID-19?    X              What to do next: Answered NO to all: Answered YES to anything:   Proceed with unit schedule Follow the [facility] Inpatient Flowsheet.

## 2019-05-17 NOTE — Progress Notes (Signed)
[facility] MD Progress Note  05/17/2019 10:17 AM Marvin Crawford  MRN:  [id number] Subjective: Patient is a 23 year old male with a past psychiatric history significant for schizoaffective disorder versus schizophrenia who was admitted on 05/15/2019 after noncompliance with medications.  The patient was walking down the street naked, and the patient had become significantly paranoid believing that his brother had a gun and was trying to kill him.  Objective: Patient is seen and examined.  Patient is a 23 year old male with the above-stated past psychiatric history who is seen in follow-up.  Patient seems to be doing better.  He denied any auditory or visual hallucinations.  He denied any paranoia about his brother trying to kill him.  He stated he is taking his medications and it is proving.  Nursing notes from last night said that he was still somewhat paranoid.  His current medications include Cogentin, Haldol and trazodone.  His medications on his last evaluation on 04/12/2019 included Cogentin, Haldol and trazodone.  His blood pressure stable, he is mildly tachycardic with a rate at 109.  He slept 4.5 hours day before yesterday, but slept 6.75 hours last night.  Review of his laboratories on admission revealed essentially normal chemistries, a normal CBC with differential, mildly elevated glucose and a normal TSH.  Principal Problem: <principal problem not specified> Diagnosis: Active Problems:   Cannabis use disorder, moderate, dependence (HCC)   Schizoaffective disorder (HCC)   Schizophrenia, acute (HCC)  Total Time spent with patient: 20 minutes  Past Psychiatric History: See admission H&P  Past Medical History:  Past Medical History:  Diagnosis Date  . ADD (attention deficit disorder)   . Anxiety   . Depression   . Environmental allergies   . Schizo-affective psychosis (HCC)    History reviewed. No pertinent surgical history. Family History:  Family History  Problem Relation Age of Onset   . Hypertension Mother   . Hypertension Father   . Mental illness Neg Hx    Family Psychiatric  History: See admission H&P Social History:  Social History   Substance and Sexual Activity  Alcohol Use Yes  . Alcohol/week: 1.0 standard drinks  . Types: 1 Standard drinks or equivalent per week   Comment: "once in a blue moon"     Social History   Substance and Sexual Activity  Drug Use Yes  . Types: Other-see comments, Marijuana   Comment: oil smoked in pipe calls "DAB", friends buy for him    Social History   Socioeconomic History  . Marital status: Single    Spouse name: Not on file  . Number of children: Not on file  . Years of education: Not on file  . Highest education level: Not on file  Occupational History  . Not on file  Social Needs  . Financial resource strain: Not on file  . Food insecurity    Worry: Not on file    Inability: Not on file  . Transportation needs    Medical: Not on file    Non-medical: Not on file  Tobacco Use  . Smoking status: Current Every Day Smoker    Packs/day: 2.00    Start date: 02/05/2019  . Smokeless tobacco: Never Used  Substance and Sexual Activity  . Alcohol use: Yes    Alcohol/week: 1.0 standard drinks    Types: 1 Standard drinks or equivalent per week    Comment: "once in a blue moon"  . Drug use: Yes    Types: Other-see comments, Marijuana  Comment: oil smoked in pipe calls "DAB", friends buy for him  . Sexual activity: Yes    Birth control/protection: Condom  Lifestyle  . Physical activity    Days per week: Not on file    Minutes per session: Not on file  . Stress: Not on file  Relationships  . Social Musicianconnections    Talks on phone: Not on file    Gets together: Not on file    Attends religious service: Not on file    Active member of club or organization: Not on file    Attends meetings of clubs or organizations: Not on file    Relationship status: Not on file  Other Topics Concern  . Not on file  Social  History Narrative   Graduated from high school at age 23. He took IEP classes until he was in 10th grade. In 11th and 12th grade, he took normal classes and says he didn't need any extra help. He did 2 months of college at [employer], and this was difficult for him. He dropped out of college, due to difficulty balancing work and school.    Additional Social History:    Pain Medications: None reported Prescriptions: Abilify 10mg  qd; Lexapro 10 mg qd; Lamisil History of alcohol / drug use?: No history of alcohol / drug abuse                    Sleep: Fair  Appetite:  Fair  Current Medications: Current Facility-Administered Medications  Medication Dose Route Frequency Provider Last Rate Last Dose  . benztropine (COGENTIN) tablet 1 mg  1 mg Oral BID Denzil Magnusonhomas, Lashunda, NP   1 mg at 05/17/19 [id number]  . haloperidol (HALDOL) tablet 10 mg  10 mg Oral Daily Malvin JohnsFarah, Brian, MD   10 mg at 05/17/19 [id number]  . haloperidol (HALDOL) tablet 20 mg  20 mg Oral QHS Malvin JohnsFarah, Brian, MD   20 mg at 05/16/19 2104  . traZODone (DESYREL) tablet 100 mg  100 mg Oral QHS PRN Denzil Magnusonhomas, Lashunda, NP   100 mg at 05/16/19 2104    Lab Results:  Results for orders placed or performed during the [facility] encounter of 05/14/19 (from the past 48 hour(s))  CBC     Status: None   Collection Time: 05/16/19  6:32 AM  Result Value Ref Range   WBC 5.1 4.0 - 10.5 K/uL   RBC 4.65 4.22 - 5.81 MIL/uL   Hemoglobin 13.5 13.0 - 17.0 g/dL   HCT 62.940.3 52.839.0 - 41.352.0 %   MCV 86.7 80.0 - 100.0 fL   MCH 29.0 26.0 - 34.0 pg   MCHC 33.5 30.0 - 36.0 g/dL   RDW 24.413.0 01.011.5 - 27.215.5 %   Platelets 237 150 - 400 K/uL   nRBC 0.0 0.0 - 0.2 %    Comment: Performed at [facility], [address] [place], [place] [id number]  Comprehensive metabolic panel     Status: Abnormal   Collection Time: 05/16/19  6:32 AM  Result Value Ref Range   Sodium 141 135 - 145 mmol/L   Potassium 3.9 3.5 - 5.1 mmol/L   Chloride 103 98 - 111 mmol/L   CO2 27 22  - 32 mmol/L   Glucose, Bld 103 (H) 70 - 99 mg/dL   BUN 10 6 - 20 mg/dL   Creatinine, Ser 4.031.02 0.61 - 1.24 mg/dL   Calcium 47.410.0 8.9 - 25.910.3 mg/dL   Total Protein 6.8 6.5 - 8.1 g/dL  Albumin 4.1 3.5 - 5.0 g/dL   AST 32 15 - 41 U/L   ALT 36 0 - 44 U/L   Alkaline Phosphatase 60 38 - 126 U/L   Total Bilirubin 0.7 0.3 - 1.2 mg/dL   GFR calc non Af Amer >60 >60 mL/min   GFR calc Af Amer >60 >60 mL/min   Anion gap 11 5 - 15    Comment: Performed at [facility], [place] [address], [place], [place] [address]  Hemoglobin A1c     Status: None   Collection Time: 05/16/19  6:32 AM  Result Value Ref Range   Hgb A1c MFr Bld 5.5 4.8 - 5.6 %    Comment: (NOTE) Pre diabetes:          5.7%-6.4% Diabetes:              >6.4% Glycemic control for   <7.0% adults with diabetes    Mean Plasma Glucose 111.15 mg/dL    Comment: Performed at [facility] [address] [place], [place] [address]  Lipid panel     Status: Abnormal   Collection Time: 05/16/19  6:32 AM  Result Value Ref Range   Cholesterol 145 0 - 200 mg/dL   Triglycerides 47 <150 mg/dL   HDL 38 (L) >40 mg/dL   Total CHOL/HDL Ratio 3.8 RATIO   VLDL 9 0 - 40 mg/dL   LDL Cholesterol 98 0 - 99 mg/dL    Comment:        Total Cholesterol/HDL:CHD Risk Coronary Heart Disease Risk Table                     Men   Women  1/2 Average Risk   3.4   3.3  Average Risk       5.0   4.4  2 X Average Risk   9.6   7.1  3 X Average Risk  23.4   11.0        Use the calculated Patient Ratio above and the CHD Risk Table to determine the patient's CHD Risk.        ATP III CLASSIFICATION (LDL):  <100     mg/dL   Optimal  100-129  mg/dL   Near or Above                    Optimal  130-159  mg/dL   Borderline  160-189  mg/dL   High  >190     mg/dL   Very High Performed at [facility] [address] [place], [place] [address]   Prolactin     Status: Abnormal   Collection Time: 05/16/19  6:32 AM  Result  Value Ref Range   Prolactin 2.2 (L) 4.0 - 15.2 ng/mL    Comment: (NOTE) Performed At: [facility] [place], [place] [id number] Rush Farmer MD OZ:[id number]   TSH     Status: None   Collection Time: 05/16/19  6:32 AM  Result Value Ref Range   TSH 1.066 0.350 - 4.500 uIU/mL    Comment: Performed by a 3rd Generation assay with a functional sensitivity of <=0.01 uIU/mL. Performed at [facility], [place] [address] [place] [address], [place], [place] [address]     Blood Alcohol level:  Lab Results  Component Value Date   [facility] <10 10/12/2018   ETH <10 84/13/2440    Metabolic Disorder Labs: Lab Results  Component Value Date   HGBA1C  5.5 05/16/2019   MPG 111.15 05/16/2019   MPG 122.63 10/15/2018   Lab Results  Component Value Date   PROLACTIN 2.2 (L) 05/16/2019   PROLACTIN 3.4 (L) 10/15/2018   Lab Results  Component Value Date   CHOL 145 05/16/2019   TRIG 47 05/16/2019   HDL 38 (L) 05/16/2019   CHOLHDL 3.8 05/16/2019   VLDL 9 05/16/2019   LDLCALC 98 05/16/2019   LDLCALC 100 (H) 10/15/2018    Physical Findings: AIMS: Facial and Oral Movements Muscles of Facial Expression: None, normal Lips and Perioral Area: None, normal Jaw: None, normal Tongue: None, normal,Extremity Movements Upper (arms, wrists, hands, fingers): None, normal Lower (legs, knees, ankles, toes): None, normal, Trunk Movements Neck, shoulders, hips: None, normal, Overall Severity Severity of abnormal movements (highest score from questions above): None, normal Incapacitation due to abnormal movements: None, normal Patient's awareness of abnormal movements (rate only patient's report): No Awareness, Dental Status Current problems with teeth and/or dentures?: No Does patient usually wear dentures?: No  CIWA:  CIWA-Ar Total: 3 COWS:  COWS Total Score: 3  Musculoskeletal: Strength & Muscle Tone: within normal limits Gait & Station: normal Patient leans: N/A  Psychiatric  Specialty Exam: Physical Exam  Nursing note and vitals reviewed. Constitutional: He is oriented to person, place, and time. He appears well-developed and well-nourished.  HENT:  Head: Normocephalic and atraumatic.  Respiratory: Effort normal.  Neurological: He is alert and oriented to person, place, and time.    ROS  Blood pressure 131/87, pulse (!) 109, temperature 97.8 F (36.6 C), resp. rate 20, height 6\' 2"  (1.88 m), weight 107.5 kg, SpO2 100 %.Body mass index is 30.43 kg/m.  General Appearance: Casual  Eye Contact:  Fair  Speech:  Normal Rate  Volume:  Normal  Mood:  Anxious and Dysphoric  Affect:  Congruent  Thought Process:  Coherent and Descriptions of Associations: Circumstantial  Orientation:  Full (Time, Place, and Person)  Thought Content:  Delusions and Paranoid Ideation  Suicidal Thoughts:  No  Homicidal Thoughts:  No  Memory:  Immediate;   Fair Recent;   Fair Remote;   Fair  Judgement:  Impaired  Insight:  Fair  Psychomotor Activity:  Decreased  Concentration:  Concentration: Fair and Attention Span: Fair  Recall:  of Knowledge:  Fair  Language:  Fair  Akathisia:  Negative  Handed:  Right  AIMS (if indicated):     Assets:  Desire for Improvement Resilience  ADL's:  Intact  Cognition:  WNL  Sleep:  Number of Hours: 6.75     Treatment Plan Summary: Daily contact with patient to assess and evaluate symptoms and progress in treatment, Medication management and Plan : Patient is seen and examined.  Patient is a 23 year old male with the above-stated past psychiatric history who is seen in follow-up.   Diagnosis: #1 schizophrenia versus schizoaffective disorder  Patient is seen in follow-up.  He seems to be improving.  He does have some residual paranoia, but overall denied auditory and visual hallucinations as well as suicidal and homicidal ideation.  No change in his current medications. 1.  Continue Cogentin 1 mg p.o. twice daily for side  effects of medications. 2.  Continue haloperidol 10 mg p.o. daily and 20 mg p.o. nightly for psychosis. 3.  Continue trazodone 100 mg p.o. nightly as needed insomnia. 4.  Disposition planning-in progress.  30, MD 05/17/2019, 10:17 AM

## 2019-05-17 NOTE — Progress Notes (Signed)
D: Pt denies SI/HI/AVH. Pt is pleasant and cooperative. Pt keeps to himself, pt paranoid on the unit.   A: Pt was offered support and encouragement. Pt was given scheduled medications. Pt was encourage to attend groups. Q 15 minute checks were done for safety.   R:Pt attends groups and interacts well with peers and staff. Pt is taking medication. Pt has no complaints.Pt receptive to treatment and safety maintained on unit.

## 2019-05-17 NOTE — [facility] Group Notes (Signed)
[facility] Group Notes: (Clinical Social Work)   05/17/2019      Type of Therapy:  Group Therapy   Participation Level:  Did Not Attend - was invited both individually by MHT and by overhead announcement, chose not to attend.   Amilcar Reever Grossman-Orr, LCSW 05/17/2019, 2:10 PM     

## 2019-05-18 MED ORDER — DIPHENHYDRAMINE HCL 50 MG PO CAPS
50.0000 mg | ORAL_CAPSULE | Freq: Once | ORAL | Status: AC
  Administered 2019-05-18: 01:00:00 50 mg via ORAL
  Filled 2019-05-18: qty 1

## 2019-05-18 MED ORDER — SELENIUM SULFIDE 1 % EX LOTN
TOPICAL_LOTION | Freq: Every day | CUTANEOUS | Status: DC
  Administered 2019-05-18 – 2019-05-20 (×2): via TOPICAL
  Filled 2019-05-18: qty 207

## 2019-05-18 MED ORDER — DIPHENHYDRAMINE HCL 25 MG PO CAPS
ORAL_CAPSULE | ORAL | Status: AC
  Filled 2019-05-18: qty 2

## 2019-05-18 MED ORDER — TRAZODONE HCL 100 MG PO TABS
100.0000 mg | ORAL_TABLET | Freq: Every day | ORAL | Status: DC
  Administered 2019-05-18 – 2019-05-19 (×2): 100 mg via ORAL
  Filled 2019-05-18 (×4): qty 1

## 2019-05-18 NOTE — Progress Notes (Signed)
[facility]  05/18/2019 11:10 AM Marvin Crawford  MRN:  [id number] Subjective:  Patient is a 23 year old male with a past psychiatric history significant for schizoaffective disorder versus schizophrenia who was admitted on 05/15/2019 after noncompliance with medications.  The patient was walking down the street naked, and the patient had become significantly paranoid believing that his brother had a gun and was trying to kill him.  Objective: Patient is seen and examined.  Patient is a 23 year old male with the above-stated past psychiatric history who is seen in follow-up.  He continues to deny complaints.  He denied any auditory or visual hallucinations.  He denied any suicidal or homicidal ideation.  He did sign the release of information for us to be able to talk to his family.  His vital signs are stable, he is afebrile.  His sleep is still poor.  Nursing notes reflect that he slept 2 hours last night.  Medicines include 20 mg of Haldol and 100 mg of trazodone.  No new laboratories.   Principal Problem: <principal problem not specified> Diagnosis: Active Problems:   Cannabis use disorder, moderate, dependence (HCC)   Schizoaffective disorder (HCC)   Schizophrenia, acute (HCC)  Total Time spent with patient: 15 minutes  Past Psychiatric History: See admission H&P  Past Medical History:  Past Medical History:  Diagnosis Date  . ADD (attention deficit disorder)   . Anxiety   . Depression   . Environmental allergies   . Schizo-affective psychosis (HCC)    History reviewed. No pertinent surgical history. Family History:  Family History  Problem Relation Age of Onset  . Hypertension Mother   . Hypertension Father   . Mental illness Neg Hx    Family Psychiatric  History: See admission H&P Social History:  Social History   Substance and Sexual Activity  Alcohol Use Yes  . Alcohol/week: 1.0 standard drinks  . Types: 1 Standard drinks or equivalent per week   Comment:  "once in a blue moon"     Social History   Substance and Sexual Activity  Drug Use Yes  . Types: Other-see comments, Marijuana   Comment: oil smoked in pipe calls "DAB", friends buy for him    Social History   Socioeconomic History  . Marital status: Single    Spouse name: Not on file  . Number of children: Not on file  . Years of education: Not on file  . Highest education level: Not on file  Occupational History  . Not on file  Social Needs  . Financial resource strain: Not on file  . Food insecurity    Worry: Not on file    Inability: Not on file  . Transportation needs    Medical: Not on file    Non-medical: Not on file  Tobacco Use  . Smoking status: Current Every Day Smoker    Packs/day: 2.00    Start date: 02/05/2019  . Smokeless tobacco: Never Used  Substance and Sexual Activity  . Alcohol use: Yes    Alcohol/week: 1.0 standard drinks    Types: 1 Standard drinks or equivalent per week    Comment: "once in a blue moon"  . Drug use: Yes    Types: Other-see comments, Marijuana    Comment: oil smoked in pipe calls "DAB", friends buy for him  . Sexual activity: Yes    Birth control/protection: Condom  Lifestyle  . Physical activity    Days per week: Not on file    Minutes  per session: Not on file  . Stress: Not on file  Relationships  . Social Musician on phone: Not on file    Gets together: Not on file    Attends religious service: Not on file    Active member of club or organization: Not on file    Attends meetings of clubs or organizations: Not on file    Relationship status: Not on file  Other Topics Concern  . Not on file  Social History Narrative   Graduated from high school at age 23. He took IEP classes until he was in 10th grade. In 11th and 12th grade, he took normal classes and says he didn't need any extra help. He did 2 months of college at [employer], and this was difficult for him. He dropped out of college, due to difficulty balancing  work and school.    Additional Social History:    Pain Medications: None reported Prescriptions: Abilify 10mg  qd; Lexapro 10 mg qd; Lamisil History of alcohol / drug use?: No history of alcohol / drug abuse                    Sleep: Poor  Appetite:  Fair  Current Medications: Current Facility-Administered Medications  Medication Dose Route Frequency Provider Last Rate Last Dose  . benztropine (COGENTIN) tablet 1 mg  1 mg Oral BID , NP   1 mg at 05/18/19 0843  . haloperidol (HALDOL) tablet 10 mg  10 mg Oral Daily 07/18/19, MD   10 mg at 05/18/19 0843  . haloperidol (HALDOL) tablet 20 mg  20 mg Oral QHS 07/18/19, MD   20 mg at 05/17/19 2118  . selenium sulfide (SELSUN) 1 % shampoo   Topical Daily 2119 A, NP      . traZODone (DESYREL) tablet 100 mg  100 mg Oral QHS PRN Nira Conn, NP   100 mg at 05/17/19 2118    Lab Results: No results found for this or any previous visit (from the past 48 hour(s)).  Blood Alcohol level:  Lab Results  Component Value Date   ETH <10 10/12/2018   ETH <10 08/17/2017    Metabolic Disorder Labs: Lab Results  Component Value Date   HGBA1C 5.5 05/16/2019   MPG 111.15 05/16/2019   MPG 122.63 10/15/2018   Lab Results  Component Value Date   PROLACTIN 2.2 (L) 05/16/2019   PROLACTIN 3.4 (L) 10/15/2018   Lab Results  Component Value Date   CHOL 145 05/16/2019   TRIG 47 05/16/2019   HDL 38 (L) 05/16/2019   CHOLHDL 3.8 05/16/2019   VLDL 9 05/16/2019   LDLCALC 98 05/16/2019   LDLCALC 100 (H) 10/15/2018    Physical Findings: AIMS: Facial and Oral Movements Muscles of Facial Expression: None, normal Lips and Perioral Area: None, normal Jaw: None, normal Tongue: None, normal,Extremity Movements Upper (arms, wrists, hands, fingers): None, normal Lower (legs, knees, ankles, toes): None, normal, Trunk Movements Neck, shoulders, hips: None, normal, Overall Severity Severity of abnormal movements  (highest score from questions above): None, normal Incapacitation due to abnormal movements: None, normal Patient's awareness of abnormal movements (rate only patient's report): No Awareness, Dental Status Current problems with teeth and/or dentures?: No Does patient usually wear dentures?: No  CIWA:  CIWA-Ar Total: 3 COWS:  COWS Total Score: 3  Musculoskeletal: Strength & Muscle Tone: within normal limits Gait & Station: normal Patient leans: N/A  Psychiatric Specialty Exam: Physical Exam  Nursing note and vitals reviewed. Constitutional: He is oriented to person, place, and time. He appears well-developed and well-nourished.  HENT:  Head: Normocephalic and atraumatic.  Respiratory: Effort normal.  Neurological: He is alert and oriented to person, place, and time.    ROS  Blood pressure 124/61, pulse (!) 112, temperature 99 F (37.2 C), temperature source Oral, resp. rate 16, height 6\' 2"  (1.88 m), weight 107.5 kg, SpO2 100 %.Body mass index is 30.43 kg/m.  General Appearance: Casual  Eye Contact:  Fair  Speech:  Normal Rate  Volume:  Normal  Mood:  Euthymic  Affect:  Flat  Thought Process:  Goal Directed and Descriptions of Associations: Intact  Orientation:  Negative  Thought Content:  Logical  Suicidal Thoughts:  No  Homicidal Thoughts:  No  Memory:  Immediate;   Fair Recent;   Fair Remote;   Fair  Judgement:  Impaired  Insight:  Fair  Psychomotor Activity:  Normal  Concentration:  Concentration: Fair and Attention Span: Fair  Recall:  [employer] of Knowledge:  Poor  Language:  Fair  Akathisia:  Negative  Handed:  Right  AIMS (if indicated):     Assets:  Desire for Improvement Resilience  ADL's:  Intact  Cognition:  Impaired,  Mild  Sleep:  Number of Hours: 2     Treatment Plan Summary: Daily contact with patient to assess and evaluate symptoms and progress in treatment, Medication management and Plan : Patient is seen and examined.  Patient is a  23 year old male with the above-stated past psychiatric history who is seen in follow-up.   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  He seems to be improving from his admission note.  Perhaps some slight paranoia, but he denied any auditory or visual hallucinations.  He denied any suicidal or homicidal ideation.  He is not sleeping well, and I am going to change his trazodone to a standing dose.  It does not appear that he is asked for the trazodone, but if we give it to him hopefully he will sleep better.  No other changes in his medicines.  1.  Continue Cogentin 1 mg p.o. twice daily for side effects of medications. 2.  Continue haloperidol 10 mg p.o. daily and 20 mg p.o. nightly for psychosis. 3.  Change trazodone to 100 mg p.o. nightly standing for insomnia. 4.  Disposition planning-in progress. Sharma Covert, MD 05/18/2019, 11:10 AM

## 2019-05-18 NOTE — [facility] Group Notes (Signed)
[facility] Group Notes:  (Nursing/MHT/Case Management/Adjunct)  Date:  05/18/2019  Time:  0900  Type of Therapy:  Nurse Education  Participation Level:  Active  Participation Quality:  Appropriate  Affect:  Appropriate  Cognitive:  Disorganized  Insight:  Improving  Engagement in Group:  Engaged  Modes of Intervention:  Discussion and Education  Summary of Progress/Problems:  Marissa Calamity 05/18/2019, 11:27 AM

## 2019-05-18 NOTE — Progress Notes (Signed)
[place] NOVEL CORONAVIRUS (COVID-19) DAILY CHECK-OFF SYMPTOMS - answer yes or no to each - every day NO YES  Have you had a fever in the past 24 hours?  . Fever (Temp > 37.80C / 100F) X   Have you had any of these symptoms in the past 24 hours? . New Cough .  Sore Throat  .  Shortness of Breath .  Difficulty Breathing .  Unexplained Body Aches   X   Have you had any one of these symptoms in the past 24 hours not related to allergies?   . Runny Nose .  Nasal Congestion .  Sneezing   X   If you have had runny nose, nasal congestion, sneezing in the past 24 hours, has it worsened?  X   EXPOSURES - check yes or no X   Have you traveled outside the state in the past 14 days?  X   Have you been in contact with someone with a confirmed diagnosis of COVID-19 or PUI in the past 14 days without wearing appropriate PPE?  X   Have you been living in the same home as a person with confirmed diagnosis of COVID-19 or a PUI (household contact)?    X   Have you been diagnosed with COVID-19?    X              What to do next: Answered NO to all: Answered YES to anything:   Proceed with unit schedule Follow the [facility] Inpatient Flowsheet.   

## 2019-05-18 NOTE — Progress Notes (Signed)
D. Pt has been calm and cooperative- visible in the milieu attending groups.. Pt currently denies SI/HI and AVH  A. Labs and vitals monitored. Pt compliant with medications. Pt supported emotionally and encouraged to express concerns and ask questions.   R. Pt remains safe with 15 minute checks. Will continue POC.

## 2019-05-18 NOTE — Progress Notes (Signed)
Adult Psychoeducational Group Note  Date:  05/18/2019 Time:  10:40 PM  Group Topic/Focus:  Wrap-Up Group:   The focus of this group is to help patients review their daily goal of treatment and discuss progress on daily workbooks.  Participation Level:  Active  Participation Quality:  Appropriate  Affect:  Appropriate and Excited  Cognitive:  Appropriate  Insight: Appropriate  Engagement in Group:  Developing/Improving  Modes of Intervention:  Discussion  Additional Comments:  Pt stated his goal for today was to talk with his doctor about his discharge plan. Pt stated he felt he accomplished his goal today. Pt stated he is hoping to be discharge tomorrow.  Pt stated his relationship with his family has improved since he was admitted here. Pt stated he felt better about himself today. Pt rated his overall day a 10. Pt stated his appetite was pretty good today. Pt stated his goal for tonight was to get some rest. Pt complain of having a headache tonight.  Pt nurse was made aware of the situation. Pt stated he was not hearing or seeing anything that was not there. Pt stated he had no thoughts of harming himself or others. Pt stated he would alert staff if anything changes.   Candy Sledge 05/18/2019, 10:40 PM

## 2019-05-18 NOTE — Progress Notes (Signed)
Pateint has been isolative to his room sitting on the bed. Writer encouraged him to come to the dayroom so that he could watch tv. He came for a brief time before returning to his room. He is preoccupied and guarded but pleasant. Safety  Maintained with 15 min checks.

## 2019-05-18 NOTE — [facility] Group Notes (Signed)
[facility] LCSW Group Therapy Note  Date/Time:  05/18/2019  11:00AM-12:00PM  Type of Therapy and Topic:  Group Therapy:  Music and Mood  Participation Level:  Active   Description of Group: In this process group, members listened to a variety of genres of music and identified that different types of music evoke different responses.  Patients were encouraged to identify music that was soothing for them and music that was energizing for them.  Patients discussed how this knowledge can help with wellness and recovery in various ways including managing depression and anxiety as well as encouraging healthy sleep habits.    Therapeutic Goals: 1. Patients will explore the impact of different varieties of music on mood 2. Patients will verbalize the thoughts they have when listening to different types of music 3. Patients will identify music that is soothing to them as well as music that is energizing to them 4. Patients will discuss how to use this knowledge to assist in maintaining wellness and recovery 5. Patients will explore the use of music as a coping skill  Summary of Patient Progress:  At the beginning of group, patient expressed that he felt "good."  At the end of group, patient expressed the same, that he felt "good."  However, during group he was observed to be somewhat tearful, although he tried to hide this.    Therapeutic Modalities: Solution Focused Brief Therapy Activity   Selmer Dominion, LCSW

## 2019-05-19 NOTE — Plan of Care (Signed)
Pt was able to identify coping strategies at completion of recreational therapy group sessions.   [name], LRT/CTRS

## 2019-05-19 NOTE — Progress Notes (Signed)
Patient ID: Marvin Crawford, male   DOB: 01/06/1996, 23 y.o.   MRN: [id number]   [place] NOVEL CORONAVIRUS (COVID-19) DAILY CHECK-OFF SYMPTOMS - answer yes or no to each - every day NO YES  Have you had a fever in the past 24 hours?  . Fever (Temp > 37.80C / 100F) X   Have you had any of these symptoms in the past 24 hours? . New Cough .  Sore Throat  .  Shortness of Breath .  Difficulty Breathing .  Unexplained Body Aches   X   Have you had any one of these symptoms in the past 24 hours not related to allergies?   . Runny Nose .  Nasal Congestion .  Sneezing   X   If you have had runny nose, nasal congestion, sneezing in the past 24 hours, has it worsened?  X   EXPOSURES - check yes or no X   Have you traveled outside the state in the past 14 days?  X   Have you been in contact with someone with a confirmed diagnosis of COVID-19 or PUI in the past 14 days without wearing appropriate PPE?  X   Have you been living in the same home as a person with confirmed diagnosis of COVID-19 or a PUI (household contact)?    X   Have you been diagnosed with COVID-19?    X              What to do next: Answered NO to all: Answered YES to anything:   Proceed with unit schedule Follow the [facility] Inpatient Flowsheet.   

## 2019-05-19 NOTE — Plan of Care (Signed)
  Problem: Activity: Goal: Interest or engagement in activities will improve Outcome: Progressing   Problem: Coping: Goal: Ability to verbalize frustrations and anger appropriately will improve Outcome: Progressing Goal: Ability to demonstrate self-control will improve Outcome: Progressing   

## 2019-05-19 NOTE — Progress Notes (Signed)
[facility]  05/19/2019 11:31 AM Marvin Crawford  MRN:  [id number] Subjective:   Patient denies current auditory or visual hallucinations and denies thoughts of harming self or others he can contract he is focused on discharge however again this is a repeat admission he wanted make sure that his meds are in order.  He does agree to long-acting injectable.  Reports he is current with his aripiprazole Principal Problem: Exacerbation of underlying psychotic disorder/partially responsive in the past Diagnosis: Active Problems:   Cannabis use disorder, moderate, dependence (HCC)   Schizoaffective disorder (HCC)   Schizophrenia, acute (HCC)  Total Time spent with patient: 20 minutes  Past Medical History:  Past Medical History:  Diagnosis Date  . ADD (attention deficit disorder)   . Anxiety   . Depression   . Environmental allergies   . Schizo-affective psychosis (HCC)    History reviewed. No pertinent surgical history. Family History:  Family History  Problem Relation Age of Onset  . Hypertension Mother   . Hypertension Father   . Mental illness Neg Hx    Family Psychiatric  History: no new  Social History:  Social History   Substance and Sexual Activity  Alcohol Use Yes  . Alcohol/week: 1.0 standard drinks  . Types: 1 Standard drinks or equivalent per week   Comment: "once in a blue moon"     Social History   Substance and Sexual Activity  Drug Use Yes  . Types: Other-see comments, Marijuana   Comment: oil smoked in pipe calls "DAB", friends buy for him    Social History   Socioeconomic History  . Marital status: Single    Spouse name: Not on file  . Number of children: Not on file  . Years of education: Not on file  . Highest education level: Not on file  Occupational History  . Not on file  Social Needs  . Financial resource strain: Not on file  . Food insecurity    Worry: Not on file    Inability: Not on file  . Transportation needs    Medical:  Not on file    Non-medical: Not on file  Tobacco Use  . Smoking status: Current Every Day Smoker    Packs/day: 2.00    Start date: 02/05/2019  . Smokeless tobacco: Never Used  Substance and Sexual Activity  . Alcohol use: Yes    Alcohol/week: 1.0 standard drinks    Types: 1 Standard drinks or equivalent per week    Comment: "once in a blue moon"  . Drug use: Yes    Types: Other-see comments, Marijuana    Comment: oil smoked in pipe calls "DAB", friends buy for him  . Sexual activity: Yes    Birth control/protection: Condom  Lifestyle  . Physical activity    Days per week: Not on file    Minutes per session: Not on file  . Stress: Not on file  Relationships  . Social Musicianconnections    Talks on phone: Not on file    Gets together: Not on file    Attends religious service: Not on file    Active member of club or organization: Not on file    Attends meetings of clubs or organizations: Not on file    Relationship status: Not on file  Other Topics Concern  . Not on file  Social History Narrative   Graduated from high school at age 23. He took IEP classes until he was in 10th grade. In  11th and 12th grade, he took normal classes and says he didn't need any extra help. He did 2 months of college at [employer] and this was difficult for him. He dropped out of college, due to difficulty balancing work and school.    Additional Social History:    Pain Medications: None reported Prescriptions: Abilify 10mg  qd; Lexapro 10 mg qd; Lamisil History of alcohol / drug use?: No history of alcohol / drug abuse                    Sleep: Fair  Appetite:  Fair  Current Medications: Current Facility-Administered Medications  Medication Dose Route Frequency Provider Last Rate Last Dose  . benztropine (COGENTIN) tablet 1 mg  1 mg Oral BID Mordecai Maes, NP   1 mg at 05/19/19 0933  . haloperidol (HALDOL) tablet 10 mg  10 mg Oral Daily Johnn Hai, MD   10 mg at 05/19/19 0933  . haloperidol  (HALDOL) tablet 20 mg  20 mg Oral QHS Johnn Hai, MD   20 mg at 05/18/19 2104  . selenium sulfide (SELSUN) 1 % shampoo   Topical Daily Lindon Romp A, NP      . traZODone (DESYREL) tablet 100 mg  100 mg Oral QHS Sharma Covert, MD   100 mg at 05/18/19 2104    Lab Results: No results found for this or any previous visit (from the past 14 hour(s)).  Blood Alcohol level:  Lab Results  Component Value Date   ETH <10 10/12/2018   ETH <10 25/36/6440    Metabolic Disorder Labs: Lab Results  Component Value Date   HGBA1C 5.5 05/16/2019   MPG 111.15 05/16/2019   MPG 122.63 10/15/2018   Lab Results  Component Value Date   PROLACTIN 2.2 (L) 05/16/2019   PROLACTIN 3.4 (L) 10/15/2018   Lab Results  Component Value Date   CHOL 145 05/16/2019   TRIG 47 05/16/2019   HDL 38 (L) 05/16/2019   CHOLHDL 3.8 05/16/2019   VLDL 9 05/16/2019   LDLCALC 98 05/16/2019   LDLCALC 100 (H) 10/15/2018    Physical Findings: AIMS: Facial and Oral Movements Muscles of Facial Expression: None, normal Lips and Perioral Area: None, normal Jaw: None, normal Tongue: None, normal,Extremity Movements Upper (arms, wrists, hands, fingers): None, normal Lower (legs, knees, ankles, toes): None, normal, Trunk Movements Neck, shoulders, hips: None, normal, Overall Severity Severity of abnormal movements (highest score from questions above): None, normal Incapacitation due to abnormal movements: None, normal Patient's awareness of abnormal movements (rate only patient's report): No Awareness, Dental Status Current problems with teeth and/or dentures?: No Does patient usually wear dentures?: No  CIWA:  CIWA-Ar Total: 3 COWS:  COWS Total Score: 3  Musculoskeletal: Strength & Muscle Tone: within normal limits Gait & Station: normal Patient leans: N/A  Psychiatric Specialty Exam: Physical Exam  ROS  Blood pressure 107/73, pulse (!) 112, temperature 98.3 F (36.8 C), temperature source Oral, resp. rate  16, height 6\' 2"  (1.88 m), weight 107.5 kg, SpO2 100 %.Body mass index is 30.43 kg/m.  General Appearance: Disheveled  Eye Contact:  Fair  Speech:  Slow  Volume:  Decreased  Mood:  Dysphoric  Affect:  Blunt  Thought Process:  Linear and Descriptions of Associations: Circumstantial  Orientation:  Full (Time, Place, and Person)  Thought Content:  Logical and Denied auditory or visual hallucinations no delusional material discerned  Suicidal Thoughts:  No  Homicidal Thoughts:  No  Memory:  Recent;  Fair Remote;   Poor  Judgement:  Fair  Insight:  Fair  Psychomotor Activity:  Normal  Concentration:  Concentration: Fair and Attention Span: Fair  Recall:  [employer] of Knowledge:  Fair  Language:  Fair  Akathisia:  Negative  Handed:  Right  AIMS (if indicated):     Assets:  Communication Skills Desire for Improvement Resilience  ADL's:  Intact  Cognition:  WNL  Sleep:  Number of Hours: 6.75     Treatment Plan Summary: Daily contact with patient to assess and evaluate symptoms and progress in treatment and Medication management  No change in precautions call family probable discharge tomorrow has long-acting injectable on board no change in meds today  Zoey Gilkeson, MD 05/19/2019, 11:31 AM

## 2019-05-19 NOTE — Progress Notes (Signed)
  [facility] Adult Case Management Discharge Plan :  Will you be returning to the same living situation after discharge:  Yes,  with mother At discharge, do you have transportation home?: No. PART bus pass provided to get to [facility].  Do you have the ability to pay for your medications: Yes,  medicaid  Release of information consent forms completed and in the chart;  Patient's signature needed at discharge.  Patient to Follow up at: Follow-up [facility] Follow up on 05/22/2019.   Why: Therapy with Sherlyn Lees is Thursday, 10/15 at 11:00a.  Your provider Chapman Moss will contact your mother for a follow up appointment.   Contact information: [place] [address] [phone]           Next level of care provider has access to [facility] and Suicide Prevention discussed: Yes,  with mother  Have you used any form of tobacco in the last 30 days? (Cigarettes, Smokeless Tobacco, Cigars, and/or Pipes): No  Has patient been referred to the Quitline?: N/A patient is not a smoker  Patient has been referred for addiction treatment: N/A  Joanne Chars, LCSW 05/19/2019, 1:13 PM

## 2019-05-19 NOTE — Progress Notes (Signed)
Recreation Therapy Notes  Date: 10.12.20 Time: 1000 Location: 400 Hall Dayroom  Group Topic: Coping Skills  Goal Area(s) Addresses:  Patient will identify positive coping skills. Patient will identify benefits of using positive coping strategies. Patient will identify how coping skills can be beneficial post d/c.  Behavioral Response:  Minimal  Intervention:  Worksheet, pencils  Activity:  Building surveyor.  Patients were given a blank Building surveyor.  Inside the web, patients were to identify and label what things/issues have held them back or stuck.  Patient were to then identify at least two coping skills for each situation they identified and write it outside the web.  Education: Radiographer, therapeutic, Dentist.   Education Outcome: Acknowledges understanding/In group clarification offered/Needs additional education.   Clinical Observations/Feedback: Pt was quiet but engaged when prompted.  Pt identified his primary issue as not taking medication on time.  Pt identified coping skills as reading, music, running, family support and taking medication.    [name], LRT/CTRS    Ria Comment, Emery Binz A 05/19/2019 11:18 AM

## 2019-05-19 NOTE — Progress Notes (Signed)
Recreation Therapy Notes  INPATIENT RECREATION TR PLAN  Patient Details Name: Marvin Crawford MRN: [id number] DOB: 1996-04-02 Today's Date: 05/19/2019  Rec Therapy Plan Is patient appropriate for Therapeutic Recreation?: Yes Treatment times per week: about 3 days Estimated Length of Stay: 5-7 days TR Treatment/Interventions: Group participation (Comment)  Discharge Criteria Pt will be discharged from therapy if:: Discharged Treatment plan/goals/alternatives discussed and agreed upon by:: Patient/family  Discharge Summary Short term goals set: See patient care plan Short term goals met: Adequate for discharge Progress toward goals comments: Groups attended Which groups?: Leisure education, Coping skills Reason goals not met: Pt being discharged. Therapeutic equipment acquired: N/A Reason patient discharged from therapy: Discharge from hospital Pt/family agrees with progress & goals achieved: Yes Date patient discharged from therapy: 05/19/19    [name], LRT/CTRS   Ria Comment, Tamee Battin A 05/19/2019, 11:37 AM

## 2019-05-19 NOTE — Progress Notes (Signed)
Adult Psychoeducational Group Note  Date:  05/19/2019 Time:  11:14 PM  Group Topic/Focus:  Wrap-Up Group:   The focus of this group is to help patients review their daily goal of treatment and discuss progress on daily workbooks.  Participation Level:  Active  Participation Quality:  Appropriate  Affect:  Appropriate  Cognitive:  Appropriate  Insight: Appropriate  Engagement in Group:  Developing/Improving  Modes of Intervention:  Discussion  Additional Comments:  Pt stated his goal for today was to talk with his doctor about his discharge plan. Pt stated he felt he accomplished his goal today. Pt stated he will be discharge tomorrow. Pt stated his relationship with his family has improved since he was admitted here. Pt stated his mother coming for visitation today help improve his day. Pt stated he felt better about himself today. Pt rated his overall day a 10. Pt stated his appetite was pretty good today. Pt stated his goal for tonight was to get some rest. Pt did not complain of pain tonight. Pt stated he was not hearing or seeing anything that was not there.  Pt stated he had no thoughts of harming himself or others. Pt stated he would alert staff if anything changes.  Candy Sledge 05/19/2019, 11:14 PM

## 2019-05-19 NOTE — Progress Notes (Signed)
D: Pt denies SI/HI/AVH. Pt is pleasant and cooperative. Pt stated he was doing ok.   A: Pt was offered support and encouragement. Pt was given scheduled medications. Pt was encourage to attend groups. Q 15 minute checks were done for safety.   R: safety maintained on unit.

## 2019-05-19 NOTE — Progress Notes (Signed)
Patient has been out of his room more tonight. He reported that his mom visited and dropped him some clothes off. He requested his shampoo for his hair and showered. He asked about other staff members that work here and on the 500 hall. He is pleasant but does not engage in conversation with other peers. Safety maintained with 15 min checks.

## 2019-05-20 MED ORDER — ARIPIPRAZOLE ER 400 MG IM SRER: 400.0000 mg | INTRAMUSCULAR | 11 refills | Status: DC

## 2019-05-20 MED ORDER — ARIPIPRAZOLE ER 400 MG IM SRER: 400.0000 mg | INTRAMUSCULAR | 0 refills | Status: DC

## 2019-05-20 MED ORDER — HALOPERIDOL 10 MG PO TABS: ORAL_TABLET | ORAL | 2 refills | Status: DC

## 2019-05-20 MED ORDER — ARIPIPRAZOLE ER 400 MG IM SRER
400.0000 mg | INTRAMUSCULAR | Status: DC
  Administered 2019-05-20: 11:00:00 400 mg via INTRAMUSCULAR

## 2019-05-20 NOTE — Discharge Summary (Addendum)
Physician Discharge Summary Note  Patient:  Marvin Crawford is an 23 y.o., male MRN:  [id number] DOB:  1996-07-13 Patient phone:  [phone] (home)  Patient address:   [address] [place] [address],  Total Time spent with patient: 45 minutes  Date of Admission:  05/14/2019 Date of Discharge: 05/20/2019  Reason for Admission:   This is a repeat admission for [name], a 23 year old patient known to the service with a chronic psychotic disorder, last admitted here on 8/27.  He re-presented on 9/5 as a walk-in to behavioral health with plans to overdose on his medication, reporting noncompliance since his discharge, reporting past suicide attempts in the chief complaint that "people were talking about him" past hospitalizations included recently here in 2020 of course past in 2018 and in 2016 at old [facility].  Currently lives with family members.  Since admission the patient is been singularly focused on discharge approaching me while I am trying around another patient's, knocking on the door and requesting to go home.  He does however acknowledge that deny auditory hallucinations.  Denies thoughts of harming self or others.  This is a pretty typical presentation for him to be generally all over the map as far as his reporting of symptoms.  He is flat in affect has no involuntary movements. Drug screen negative for all compounds tested  According to the nurse practitioner history of yesterday  Marvin Crawford an 23 y.o.male.who presents to [facility] as a walk-in, voluntarily, accompanied with his mother. Patient has a history of schizoaffective disorder. He was discharged from [facility] on 04-07-2019, and seen again on 04/12/2019. It appears that he was psychiatrically cleared and discharged 04/13/2019. When asked for his reason for admission patient stated," I have been seeing things in black and white. People are talking in my head," He states that he walked down the street naked  today.As per mother, she received a phone call from her niece stating that patient was walking down the street naked. Reports patient has been very paranoid thinking that his brother has a gun and is trying to kill him. States," He is forgetting things. Its like he has dementia." Reports patient drove his car the other day, [place] it [place] a church , walked home and could not remember where he parked it. Reports last week, patient packed all huis belongings and stated he was leaving. Reports she does not believe he would hurt himself intentionally although believes he is a danger to himself because of the psychotic state that he is in. Reports patient has been complaint with his medication although she does not think it is helping. He does have a history of drinking and smoking mariajuana. Patient denies VH, homicidal or suicidal ideations. Patient does have an ACTT team through [employer].    Principal Problem: Exacerbation of psychotic disorder Discharge Diagnoses: Active Problems:   Cannabis use disorder, moderate, dependence (HCC)   Schizoaffective disorder (HCC)   Schizophrenia, acute (HCC)   Past Psychiatric History: Extensive and obviously rehospitalized here.  He is has a history of also taking Abilify long-acting injectable and states it is up-to-date although I cannot confirm that with outpatient clinicians  Past Medical History:  Past Medical History:  Diagnosis Date  . ADD (attention deficit disorder)   . Anxiety   . Depression   . Environmental allergies   . Schizo-affective psychosis ([place])    History reviewed. No pertinent surgical history. Family History:  Family History  Problem Relation  Age of Onset  . Hypertension Mother   . Hypertension Father   . Mental illness Neg Hx    Family Psychiatric  History: Patient denies Social History:  Social History   Substance and Sexual Activity  Alcohol Use Yes  . Alcohol/week: 1.0 standard drinks  . Types: 1 Standard drinks or  equivalent per week   Comment: "once in a blue moon"     Social History   Substance and Sexual Activity  Drug Use Yes  . Types: Other-see comments, Marijuana   Comment: oil smoked in pipe calls "DAB", friends buy for him    Social History   Socioeconomic History  . Marital status: Single    Spouse name: Not on file  . Number of children: Not on file  . Years of education: Not on file  . Highest education level: Not on file  Occupational History  . Not on file  Social Needs  . Financial resource strain: Not on file  . Food insecurity    Worry: Not on file    Inability: Not on file  . Transportation needs    Medical: Not on file    Non-medical: Not on file  Tobacco Use  . Smoking status: Current Every Day Smoker    Packs/day: 2.00    Start date: 02/05/2019  . Smokeless tobacco: Never Used  Substance and Sexual Activity  . Alcohol use: Yes    Alcohol/week: 1.0 standard drinks    Types: 1 Standard drinks or equivalent per week    Comment: "once in a blue moon"  . Drug use: Yes    Types: Other-see comments, Marijuana    Comment: oil smoked in pipe calls "DAB", friends buy for him  . Sexual activity: Yes    Birth control/protection: Condom  Lifestyle  . Physical activity    Days per week: Not on file    Minutes per session: Not on file  . Stress: Not on file  Relationships  . Social Musicianconnections    Talks on phone: Not on file    Gets together: Not on file    Attends religious service: Not on file    Active member of club or organization: Not on file    Attends meetings of clubs or organizations: Not on file    Relationship status: Not on file  Other Topics Concern  . Not on file  Social History Narrative   Graduated from high school at age 23. He took IEP classes until he was in 10th grade. In 11th and 12th grade, he took normal classes and says he didn't need any extra help. He did 2 months of college at [employer], and this was difficult for him. He dropped out of  college, due to difficulty balancing work and school.     Hospital Course:    Once here the patient was always cordial and cooperative and he displayed no dangerous behaviors.  He had no EPS or TD.  Haldol was resumed at 30 mg a day in divided doses.  Again he was not a behavior problem and always denied positive symptoms.  He also lobbied for discharge.  We tend to conceptualizes case as certainly having an underlying psychotic disorder but there is a factitious component as he tends to be in the habit of endorsing symptoms whether they are present or not so the good news is he seem very stable here.  By the date of the 13th was alert oriented cooperative again focused on discharge but  no thoughts of harming self or others contracting fully reporting no positive symptoms.  Physical Findings: AIMS: Facial and Oral Movements Muscles of Facial Expression: None, normal Lips and Perioral Area: None, normal Jaw: None, normal Tongue: None, normal,Extremity Movements Upper (arms, wrists, hands, fingers): None, normal Lower (legs, knees, ankles, toes): None, normal, Trunk Movements Neck, shoulders, hips: None, normal, Overall Severity Severity of abnormal movements (highest score from questions above): None, normal Incapacitation due to abnormal movements: None, normal Patient's awareness of abnormal movements (rate only patient's report): No Awareness, Dental Status Current problems with teeth and/or dentures?: No Does patient usually wear dentures?: No  CIWA:  CIWA-Ar Total: 3 COWS:  COWS Total Score: 3 Musculoskeletal: Strength & Muscle Tone: within normal limits Gait & Station: normal Patient leans: N/A  Psychiatric Specialty Exam: ROS  Blood pressure 107/73, pulse (!) 112, temperature 98.3 F (36.8 C), temperature source Oral, resp. rate 16, height 6\' 2"  (1.88 m), weight 107.5 kg, SpO2 100 %.Body mass index is 30.43 kg/m.  General Appearance: Casual  Eye Contact::  Fair  Speech:   Clear and Coherent409  Volume:  Decreased  Mood:  Euthymic  Affect:  Restricted  Thought Process:  Goal Directed and Descriptions of Associations: Circumstantial  Orientation:  Full (Time, Place, and Person)  Thought Content:  Denies current auditory or visual hallucinations  Suicidal Thoughts:  No  Homicidal Thoughts:  No  Memory:  Immediate;   Fair Recent;   Fair Remote;   Good  Judgement:  Fair  Insight:  Fair  Psychomotor Activity:  Normal  Concentration:  Fair  Recall:  Fair  Fund of Knowledge:Fair  Language: Fair  Akathisia:  Negative  Handed:  Right  AIMS (if indicated):     Assets:  Communication Skills Housing Leisure Time Physical Health Resilience  Sleep:  Number of Hours: 6.5  Cognition: WNL  ADL's:  Intact       Have you used any form of tobacco in the last 30 days? (Cigarettes, Smokeless Tobacco, Cigars, and/or Pipes): No  Has this patient used any form of tobacco in the last 30 days? (Cigarettes, Smokeless Tobacco, Cigars, and/or Pipes) Yes, No  Blood Alcohol level:  Lab Results  Component Value Date   ETH <10 10/12/2018   ETH <10 08/17/2017    Metabolic Disorder Labs:  Lab Results  Component Value Date   HGBA1C 5.5 05/16/2019   MPG 111.15 05/16/2019   MPG 122.63 10/15/2018   Lab Results  Component Value Date   PROLACTIN 2.2 (L) 05/16/2019   PROLACTIN 3.4 (L) 10/15/2018   Lab Results  Component Value Date   CHOL 145 05/16/2019   TRIG 47 05/16/2019   HDL 38 (L) 05/16/2019   CHOLHDL 3.8 05/16/2019   VLDL 9 05/16/2019   LDLCALC 98 05/16/2019   LDLCALC 100 (H) 10/15/2018    See Psychiatric Specialty Exam and Suicide Risk Assessment completed by Attending Physician prior to discharge.  Discharge destination:  Home  Is patient on multiple antipsychotic therapies at discharge:  No   Has Patient had three or more failed trials of antipsychotic monotherapy by history:  No  Recommended Plan for Multiple Antipsychotic  Therapies: NA   Allergies as of 05/20/2019      Reactions   Peanut-containing Drug Products Other (See Comments)   Pt reports "my throat swells and I have trouble swallowing"      Medication List    TAKE these medications     Indication  benztropine 1 MG tablet Commonly  known as: COGENTIN Take 1 tablet (1 mg total) by mouth 2 (two) times daily.  Indication: Extrapyramidal Reaction caused by Medications   Descovy 200-25 MG tablet Generic drug: emtricitabine-tenofovir AF Take 1 tablet by mouth daily.  Indication: Pre-Exposure Prophylaxis of HIV   haloperidol 10 MG tablet Commonly known as: HALDOL 1 in am 2 at h s What changed: additional instructions  Indication: Psychosis   traZODone 100 MG tablet Commonly known as: DESYREL Take 1 tablet (100 mg total) by mouth at bedtime as needed for sleep.  Indication: Anxiety Disorder      Follow-up Information    [facility] Follow up on 05/22/2019.   Why: Therapy with Marvin Crawford is Thursday, 10/15 at 11:00a.  Your provider Marvin Crawford will contact your mother for a follow up appointment.   Contact information: [address] [place] [address] [phone]         Finally, patient tells me that his Abilify Marvin Crawford shot is up-to-date he is due for it on 10/14 so we went ahead and administered it on 10/13 prior to discharge  Follow-up recommendations:  Activity:  full  SignedMalvin Johns, MD 05/20/2019, 7:50 AM

## 2019-05-20 NOTE — Progress Notes (Signed)
Recreation Therapy Notes  Date: 10.13.20 Time: 1000 Location: 400 Hall Dayroom  Group Topic: Self-Esteem  Goal Area(s) Addresses:  Patient will successfully identify positive attributes about themselves.  Patient will successfully identify benefit of improved self-esteem.   Behavioral Response: Engaged  Intervention: [employer], face outlines, music  Activity: How I See Me.  Patients were given a blank outline of a face. Using words and drawing, patients were to express how they see themselves and identify all positive characteristics.  Education:  Self-Esteem, Dentist.   Education Outcome: Acknowledges education/In group clarification offered/Needs additional education  Clinical Observations/Feedback: Pt was engaged and active.  Pt was also social with peer.  Pt described himself as funny, caring, has nice teeth, likes to exercise, respectful, a church boy, likes walking and smart.    Marvin Crawford, LRT/CTRS     Marvin Crawford A 05/20/2019 11:52 AM

## 2019-05-20 NOTE — Progress Notes (Signed)
  [facility] Adult Case Management Discharge Plan :  Will you be returning to the same living situation after discharge:  Yes,  patient reports he is returning home with his mother At discharge, do you have transportation home?: Yes,  patient's mother reports she is picking the patient up  Do you have the ability to pay for your medications: Yes,  Medicaid  Release of information consent forms completed and in the chart;  Patient's signature needed at discharge.  Patient to Follow up at: Follow-up [facility] Follow up on 05/22/2019.   Why: Therapy with Sherlyn Lees is Thursday, 10/15 at 11:00a.  Your provider Chapman Moss will contact your mother for a follow up appointment.   Contact information: [address] [place] [address] [phone]           Next level of care provider has access to [place] and Suicide Prevention discussed: Yes,  with the patient's mother  Have you used any form of tobacco in the last 30 days? (Cigarettes, Smokeless Tobacco, Cigars, and/or Pipes): No  Has patient been referred to the Quitline?: N/A patient is not a smoker  Patient has been referred for addiction treatment: [facility] [place] 05/20/2019, 9:04 AM

## 2019-05-20 NOTE — Plan of Care (Signed)
Discharge Note  Patient verbalizes readiness for discharge. Follow up plan explained, AVS, Transition record and SRA given. Prescriptions and teaching provided. Belongings returned and signed for. Suicide safety plan completed and signed. Patient verbalizes understanding. Patient denies SI/HI and assures this Probation officer he will seek assistance should that change. Patient discharged to lobby where mother was waiting.  Problem: Education: Goal: Knowledge of  General Education information/materials will improve Outcome: Completed/Met Goal: Emotional status will improve Outcome: Completed/Met Goal: Mental status will improve Outcome: Completed/Met Goal: Verbalization of understanding the information provided will improve Outcome: Completed/Met   Problem: Activity: Goal: Interest or engagement in activities will improve Outcome: Completed/Met Goal: Sleeping patterns will improve Outcome: Completed/Met   Problem: Coping: Goal: Ability to verbalize frustrations and anger appropriately will improve Outcome: Completed/Met Goal: Ability to demonstrate self-control will improve Outcome: Completed/Met   Problem: Health Behavior/Discharge Planning: Goal: Identification of resources available to assist in meeting health care needs will improve Outcome: Completed/Met Goal: Compliance with treatment plan for underlying cause of condition will improve Outcome: Completed/Met   Problem: Physical Regulation: Goal: Ability to maintain clinical measurements within normal limits will improve Outcome: Completed/Met   Problem: Safety: Goal: Periods of time without injury will increase Outcome: Completed/Met   Problem: Education: Goal: Utilization of techniques to improve thought processes will improve Outcome: Completed/Met Goal: Knowledge of the prescribed therapeutic regimen will improve Outcome: Completed/Met   Problem: Activity: Goal: Interest or engagement in leisure activities will  improve Outcome: Completed/Met Goal: Imbalance in normal sleep/wake cycle will improve Outcome: Completed/Met   Problem: Coping: Goal: Coping ability will improve Outcome: Completed/Met Goal: Will verbalize feelings Outcome: Completed/Met   Problem: Health Behavior/Discharge Planning: Goal: Ability to make decisions will improve Outcome: Completed/Met Goal: Compliance with therapeutic regimen will improve Outcome: Completed/Met   Problem: Role Relationship: Goal: Will demonstrate positive changes in social behaviors and relationships Outcome: Completed/Met   Problem: Safety: Goal: Ability to disclose and discuss suicidal ideas will improve Outcome: Completed/Met Goal: Ability to identify and utilize support systems that promote safety will improve Outcome: Completed/Met   Problem: Self-Concept: Goal: Will verbalize positive feelings about self Outcome: Completed/Met Goal: Level of anxiety will decrease Outcome: Completed/Met   Problem: Education: Goal: Ability to state activities that reduce stress will improve Outcome: Completed/Met   Problem: Coping: Goal: Ability to identify and develop effective coping behavior will improve Outcome: Completed/Met   Problem: Self-Concept: Goal: Ability to identify factors that promote anxiety will improve Outcome: Completed/Met Goal: Level of anxiety will decrease Outcome: Completed/Met Goal: Ability to modify response to factors that promote anxiety will improve Outcome: Completed/Met   Problem: Activity: Goal: Will identify at least one activity in which they can participate Outcome: Completed/Met   Problem: Coping: Goal: Ability to identify and develop effective coping behavior will improve Outcome: Completed/Met Goal: Ability to interact with others will improve Outcome: Completed/Met Goal: Demonstration of participation in decision-making regarding own care will improve Outcome: Completed/Met Goal: Ability to use  eye contact when communicating with others will improve Outcome: Completed/Met   Problem: Health Behavior/Discharge Planning: Goal: Identification of resources available to assist in meeting health care needs will improve Outcome: Completed/Met   Problem: Self-Concept: Goal: Will verbalize positive feelings about self Outcome: Completed/Met

## 2019-05-20 NOTE — BHH Suicide Risk Assessment (Signed)
[facility] Discharge Suicide Risk Assessment   Principal Problem: Exacerbation of underlying psychotic disorder Discharge Diagnoses: Active Problems:   Cannabis use disorder, moderate, dependence (HCC)   Schizoaffective disorder (HCC)   Schizophrenia, acute ([place])   Total Time spent with patient: 45 minutes  Musculoskeletal: Strength & Muscle Tone: within normal limits Gait & Station: normal Patient leans: N/A  Psychiatric Specialty Exam: ROS  Blood pressure 107/73, pulse (!) 112, temperature 98.3 F (36.8 C), temperature source Oral, resp. rate 16, height 6\' 2"  (1.88 m), weight 107.5 kg, SpO2 100 %.Body mass index is 30.43 kg/m.  General Appearance: Casual  Eye Contact::  Fair  Speech:  Clear and Coherent409  Volume:  Decreased  Mood:  Euthymic  Affect:  Restricted  Thought Process:  Goal Directed and Descriptions of Associations: Circumstantial  Orientation:  Full (Time, Place, and Person)  Thought Content:  Denies current auditory or visual hallucinations  Suicidal Thoughts:  No  Homicidal Thoughts:  No  Memory:  Immediate;   Fair Recent;   Fair Remote;   Good  Judgement:  Fair  Insight:  Fair  Psychomotor Activity:  Normal  Concentration:  Fair  Recall:  [employer] of Knowledge:Fair  Language: Fair  Akathisia:  Negative  Handed:  Right  AIMS (if indicated):     Assets:  Communication Skills Housing Leisure Time Physical Health Resilience  Sleep:  Number of Hours: 6.5  Cognition: WNL  ADL's:  Intact   Mental Status Per Nursing Assessment::   On Admission:  Suicidal ideation indicated by patient  Demographic Factors:  Male  Loss Factors: Decrease in vocational status  Historical Factors: NA  Risk Reduction Factors:   Religious beliefs about death  Continued Clinical Symptoms:  Previous Psychiatric Diagnoses and Treatments  Cognitive Features That Contribute To Risk:  Polarized thinking    Suicide Risk:  Minimal: No identifiable suicidal ideation.   Patients presenting with no risk factors but with morbid ruminations; may be classified as minimal risk based on the severity of the depressive symptoms  Follow-up [place] Follow up on 05/22/2019.   Why: Therapy with Sherlyn Lees is Thursday, 10/15 at 11:00a.  Your provider Chapman Moss will contact your mother for a follow up appointment.   Contact information: [place] [address] [phone]           Plan Of Care/Follow-up recommendations:  Activity:  full  Betheny Suchecki, MD 05/20/2019, 7:46 AM

## 2019-06-22 ENCOUNTER — Other Ambulatory Visit: Payer: Self-pay

## 2019-06-22 ENCOUNTER — Encounter (HOSPITAL_COMMUNITY): Payer: Self-pay

## 2019-06-22 ENCOUNTER — Inpatient Hospital Stay (HOSPITAL_COMMUNITY)
Admission: RE | Admit: 2019-06-22 | Discharge: 2019-06-27 | DRG: 885 | Disposition: A | Discharge status: Home / Self Care | Payer: Medicaid Other | Attending: Psychiatry | Admitting: Psychiatry

## 2019-06-22 DIAGNOSIS — F988 Other specified behavioral and emotional disorders with onset usually occurring in childhood and adolescence: Secondary | ICD-10-CM | POA: Diagnosis present

## 2019-06-22 DIAGNOSIS — Z20828 Contact with and (suspected) exposure to other viral communicable diseases: Secondary | ICD-10-CM | POA: Diagnosis present

## 2019-06-22 DIAGNOSIS — F250 Schizoaffective disorder, bipolar type: Secondary | ICD-10-CM | POA: Diagnosis present

## 2019-06-22 DIAGNOSIS — F251 Schizoaffective disorder, depressive type: Secondary | ICD-10-CM

## 2019-06-22 DIAGNOSIS — F419 Anxiety disorder, unspecified: Secondary | ICD-10-CM | POA: Diagnosis present

## 2019-06-22 DIAGNOSIS — R45851 Suicidal ideations: Secondary | ICD-10-CM | POA: Diagnosis present

## 2019-06-22 DIAGNOSIS — F1290 Cannabis use, unspecified, uncomplicated: Secondary | ICD-10-CM | POA: Diagnosis present

## 2019-06-22 DIAGNOSIS — Z23 Encounter for immunization: Secondary | ICD-10-CM | POA: Diagnosis not present

## 2019-06-22 DIAGNOSIS — F17210 Nicotine dependence, cigarettes, uncomplicated: Secondary | ICD-10-CM | POA: Diagnosis present

## 2019-06-22 LAB — COMPREHENSIVE METABOLIC PANEL
ALT: 39 U/L (ref 0–44)
AST: 29 U/L (ref 15–41)
Albumin: 5 g/dL (ref 3.5–5.0)
Alkaline Phosphatase: 76 U/L (ref 38–126)
Anion gap: 11 (ref 5–15)
BUN: 14 mg/dL (ref 6–20)
CO2: 26 mmol/L (ref 22–32)
Calcium: 10.2 mg/dL (ref 8.9–10.3)
Chloride: 104 mmol/L (ref 98–111)
Creatinine, Ser: 1.18 mg/dL (ref 0.61–1.24)
GFR calc Af Amer: >60 mL/min (ref >60)
GFR calc non Af Amer: >60 mL/min (ref >60)
Glucose, Bld: 99 mg/dL (ref 70–99)
Potassium: 3.5 mmol/L (ref 3.5–5.1)
Sodium: 141 mmol/L (ref 135–145)
Total Bilirubin: 0.8 mg/dL (ref 0.3–1.2)
Total Protein: 8.1 g/dL (ref 6.5–8.1)

## 2019-06-22 LAB — LIPID PANEL
Cholesterol: 190 mg/dL (ref 0–200)
HDL: 41 mg/dL (ref >40)
LDL Cholesterol: 137 mg/dL — ABNORMAL HIGH (ref 0–99)
Total CHOL/HDL Ratio: 4.6 RATIO
Triglycerides: 60 mg/dL (ref <150)
VLDL: 12 mg/dL (ref 0–40)

## 2019-06-22 LAB — RAPID HIV SCREEN (HIV 1/2 AB+AG)
HIV 1/2 Antibodies: NONREACTIVE
HIV-1 P24 Antigen - HIV24: NONREACTIVE

## 2019-06-22 LAB — CBC
HCT: 44.1 % (ref 39.0–52.0)
Hemoglobin: 14.5 g/dL (ref 13.0–17.0)
MCH: 28.9 pg (ref 26.0–34.0)
MCHC: 32.9 g/dL (ref 30.0–36.0)
MCV: 87.8 fL (ref 80.0–100.0)
Platelets: 292 10*3/uL (ref 150–400)
RBC: 5.02 MIL/uL (ref 4.22–5.81)
RDW: 12.5 % (ref 11.5–15.5)
WBC: 5.1 10*3/uL (ref 4.0–10.5)
nRBC: 0 % (ref 0.0–0.2)

## 2019-06-22 LAB — SARS CORONAVIRUS 2 BY RT PCR (HOSPITAL ORDER, PERFORMED IN ~~LOC~~ HOSPITAL LAB): SARS Coronavirus 2: NEGATIVE

## 2019-06-22 LAB — RAPID URINE DRUG SCREEN, HOSP PERFORMED
Amphetamines: NOT DETECTED
Barbiturates: NOT DETECTED
Benzodiazepines: NOT DETECTED
Cocaine: NOT DETECTED
Opiates: NOT DETECTED
Tetrahydrocannabinol: NOT DETECTED

## 2019-06-22 LAB — TSH: TSH: 1.012 u[IU]/mL (ref 0.350–4.500)

## 2019-06-22 MED ORDER — LORATADINE 10 MG PO TABS
10.0000 mg | ORAL_TABLET | Freq: Every day | ORAL | Status: DC
  Administered 2019-06-22 – 2019-06-27 (×6): 10 mg via ORAL
  Filled 2019-06-22 (×9): qty 1

## 2019-06-22 MED ORDER — TRAZODONE HCL 50 MG PO TABS
50.0000 mg | ORAL_TABLET | Freq: Every evening | ORAL | Status: DC | PRN
  Filled 2019-06-22 (×4): qty 1

## 2019-06-22 MED ORDER — INFLUENZA VAC SPLIT QUAD 0.5 ML IM SUSY
0.5000 mL | PREFILLED_SYRINGE | INTRAMUSCULAR | Status: AC
  Administered 2019-06-23: 0.5 mL via INTRAMUSCULAR
  Filled 2019-06-22: qty 0.5

## 2019-06-22 MED ORDER — PREDNISONE 20 MG PO TABS
20.0000 mg | ORAL_TABLET | Freq: Two times a day (BID) | ORAL | Status: DC
  Administered 2019-06-22 – 2019-06-23 (×2): 20 mg via ORAL
  Filled 2019-06-22 (×7): qty 1

## 2019-06-22 MED ORDER — HYDROXYZINE HCL 25 MG PO TABS
25.0000 mg | ORAL_TABLET | Freq: Three times a day (TID) | ORAL | Status: DC | PRN
  Administered 2019-06-22 – 2019-06-26 (×4): 25 mg via ORAL
  Filled 2019-06-22 (×5): qty 1

## 2019-06-22 MED ORDER — ALUM & MAG HYDROXIDE-SIMETH 200-200-20 MG/5ML PO SUSP: 30.0000 mL | ORAL | Status: DC | PRN

## 2019-06-22 MED ORDER — CALAMINE EX LOTN
TOPICAL_LOTION | Freq: Two times a day (BID) | CUTANEOUS | Status: DC
  Administered 2019-06-22 – 2019-06-27 (×8): via TOPICAL
  Filled 2019-06-22 (×2): qty 177

## 2019-06-22 MED ORDER — MAGNESIUM HYDROXIDE 400 MG/5ML PO SUSP: 30.0000 mL | Freq: Every day | ORAL | Status: DC | PRN

## 2019-06-22 MED ORDER — ACETAMINOPHEN 325 MG PO TABS: 650.0000 mg | ORAL_TABLET | Freq: Four times a day (QID) | ORAL | Status: DC | PRN

## 2019-06-22 MED ORDER — HYDROCERIN EX CREA
TOPICAL_CREAM | Freq: Two times a day (BID) | CUTANEOUS | Status: DC
  Administered 2019-06-22 – 2019-06-27 (×9): via TOPICAL
  Filled 2019-06-22: qty 113

## 2019-06-22 MED ORDER — ARIPIPRAZOLE 10 MG PO TABS
10.0000 mg | ORAL_TABLET | Freq: Every day | ORAL | Status: DC
  Administered 2019-06-22 – 2019-06-23 (×2): 10 mg via ORAL
  Filled 2019-06-22 (×3): qty 1

## 2019-06-22 MED ORDER — HALOPERIDOL 0.5 MG PO TABS
0.5000 mg | ORAL_TABLET | Freq: Every day | ORAL | Status: DC
  Filled 2019-06-22 (×2): qty 1

## 2019-06-22 MED ORDER — TRIAMCINOLONE ACETONIDE 0.1 % EX OINT
TOPICAL_OINTMENT | Freq: Two times a day (BID) | CUTANEOUS | Status: DC
  Administered 2019-06-23 – 2019-06-27 (×6): via TOPICAL
  Filled 2019-06-22 (×2): qty 15

## 2019-06-22 MED ORDER — HYDROCORTISONE 1 % EX CREA
TOPICAL_CREAM | Freq: Two times a day (BID) | CUTANEOUS | Status: DC
  Administered 2019-06-22 – 2019-06-23 (×2): via TOPICAL
  Administered 2019-06-24: 1 via TOPICAL
  Administered 2019-06-24 – 2019-06-27 (×5): via TOPICAL
  Filled 2019-06-22: qty 28

## 2019-06-22 MED ORDER — HYDROXYZINE HCL 25 MG PO TABS: 25.0000 mg | ORAL_TABLET | Freq: Three times a day (TID) | ORAL | Status: DC | PRN

## 2019-06-22 MED ORDER — ARIPIPRAZOLE 5 MG PO TABS
5.0000 mg | ORAL_TABLET | Freq: Every day | ORAL | Status: DC
  Administered 2019-06-22: 5 mg via ORAL
  Filled 2019-06-22 (×4): qty 1

## 2019-06-22 MED ORDER — MIRTAZAPINE 7.5 MG PO TABS
7.5000 mg | ORAL_TABLET | Freq: Every day | ORAL | Status: DC
  Administered 2019-06-22 – 2019-06-23 (×2): 7.5 mg via ORAL
  Filled 2019-06-22 (×4): qty 1

## 2019-06-22 NOTE — BHH Group Notes (Signed)
[place] LCSW Group Therapy Note  Date/Time:  06/22/2019  11:00AM-12:00PM  Type of Therapy and Topic:  Group Therapy:  Music and Mood  Participation Level:  Did Not Attend   Description of Group: In this process group, members listened to a variety of genres of music and identified that different types of music evoke different responses.  Patients were encouraged to identify music that was soothing for them and music that was energizing for them.  Patients discussed how this knowledge can help with wellness and recovery in various ways including managing depression and anxiety as well as encouraging healthy sleep habits.    Therapeutic Goals: 1. Patients will explore the impact of different varieties of music on mood 2. Patients will verbalize the thoughts they have when listening to different types of music 3. Patients will identify music that is soothing to them as well as music that is energizing to them 4. Patients will discuss how to use this knowledge to assist in maintaining wellness and recovery 5. Patients will explore the use of music as a coping skill  Summary of Patient Progress:  Patient did not attend group.  Therapeutic Modalities: Solution Focused Brief Therapy Activity   Selmer Dominion, LCSW

## 2019-06-22 NOTE — H&P (Signed)
Behavioral Health Medical Screening Exam  Marvin Crawford is an 23 y.o. male.  Total Time spent with patient: 20 minutes  Psychiatric Specialty Exam: Physical Exam  Constitutional: He appears well-developed and well-nourished. No distress.  HENT:  Head: Normocephalic.  Respiratory: Effort normal. No respiratory distress.  Musculoskeletal: Normal range of motion.  Neurological: He is alert.  Skin: He is not diaphoretic.    Review of Systems  Unable to perform ROS: Mental acuity  Constitutional: Negative.     Blood pressure 107/73, pulse (!) 112, temperature 98.3 F (36.8 C), temperature source Oral, resp. rate 16, height 6\' 2"  (1.88 m), weight 107.5 kg, SpO2 100 %.Body mass index is 30.43 kg/m.  General Appearance: Fairly Groomed  Eye Contact:  Minimal  Speech:  Normal Rate  Volume:  Normal  Mood:  Anxious  Affect:  Flat  Thought Process:  Irrelevant  Orientation:  Other:  Disoriented to place and time  Thought Content:  Patient reponds in monosyllables, diffiuclt to determine thought content  Suicidal Thoughts:  denies  Homicidal Thoughts:  denies  Memory:  Immediate;   Poor  Judgement:  Impaired  Insight:  Lacking  Psychomotor Activity:  Decreased  Concentration: Concentration: Poor and Attention Span: Poor  Recall:  Poor  Fund of Knowledge:Poor  Language: Fair  Akathisia:  Negative  Handed:  Right  AIMS (if indicated):     Assets:  Financial Resources/Insurance Housing Leisure Time Physical Health  Sleep:  Number of Hours: 6.5    Musculoskeletal: Strength & Muscle Tone: within normal limits Gait & Station: normal Patient leans: N/A  Blood pressure 107/73, pulse (!) 112, temperature 98.3 F (36.8 C), temperature source Oral, resp. rate 16, height 6\' 2"  (1.88 m), weight 107.5 kg, SpO2 100 %.  Recommendations:  Based on my evaluation the patient does not appear to have an emergency medical condition.  Rozetta Nunnery, NP 06/22/2019, 12:58 AM

## 2019-06-22 NOTE — Progress Notes (Signed)
Adult Psychoeducational Group Note  Date:  06/22/2019 Time:  9:09 PM  Group Topic/Focus:  Wrap-Up Group:   The focus of this group is to help patients review their daily goal of treatment and discuss progress on daily workbooks.  Participation Level:  Minimal  Participation Quality:  Appropriate and Redirectable  Affect:  Flat  Cognitive:  Lacking  Insight: Limited  Engagement in Group:  Limited  Modes of Intervention:  Discussion, Socialization and Support  Additional Comments:  Pt attended and engaged in wrap up group. Pt goal for today was to prepare for discharge. Pt was unable to rate his day .   Ebany Bowermaster Lucy Antigua 06/22/2019, 9:09 PM

## 2019-06-22 NOTE — Progress Notes (Signed)
Patient admitted voluntarily for depression and reports worrying about covid-19. He reported SI but stated no plan. He was cooperative during admission. Skin assessment completed. Patient has eczema over entire body including hair and face, this causes him to scratch constantly. Oriented to unit and safety maintained with 15 min checks.

## 2019-06-22 NOTE — [facility] Group Notes (Signed)
[facility] Group Notes:  (Nursing/MHT/Case Management/Adjunct)  Date:  06/22/2019  Time:  1000 am  Type of Therapy:  Nurse Education  Participation Level:  Did Not Attend   Marissa Calamity 06/22/2019, 11:13 AM

## 2019-06-22 NOTE — BH Assessment (Signed)
Assessment Note  Marvin Crawford is an 23 y.o. single male who presents to [facility] accompanied by his mother, Marvin Crawford [phone], who participated in assessment. Pt has a diagnosis of schizophrenia and was discharged from [facility] 05/20/19. He cannot state where he is or why he is here. [name] reports Pt has been decompensating for the past two weeks. She says today he was sitting on the front steps of their residence talking to someone who wasn't there. She says asked who he was talking to and he said "my mother." She says Pt had urinated on himself. She says Pt has been not eating, awake at night and napping during the day, not caring for his hygiene and grooming and going outside in the cold rain with no shoes. She says he appears confused and at times disoriented. Pt gives yes or no responses and states he is not suicidal, homicidal or experiencing hallucinations but it is questionable whether Pt understands the questions. Pt states his mood is sad. Pt has a history of using marijuana.  Pt lives with his mother and brother. [name] reports Pt used to work but has no worked since his mental health symptoms became worse in January. She says Pt's has a court date for a traffic violation at the end of the month. Pt is receiving outpatient medication management through his primary care physician and [facility]. He has been psychiatrically hospitalized at [facility], [facility] and a facility in [place], [place].  Pt is casually dressed, alert and oriented only to person. He cannot identify the year. Pt speaks in a soft tone, at low volume and normal pace. Motor behavior appears normal. Eye contact is minimal. Pt's mood is depressed and affect is blunted. Pt's insight and judgment are impaired.   Diagnosis: F20.9 Schizophrenia  Past Medical History:  Past Medical History:  Diagnosis Date  . ADD (attention deficit disorder)   . Anxiety   . Depression   . Environmental allergies   .  Schizo-affective psychosis (HCC)     No past surgical history on file.  Family History:  Family History  Problem Relation Age of Onset  . Hypertension Mother   . Hypertension Father   . Mental illness Neg Hx     Social History:  reports that he has been smoking. He started smoking about 4 months ago. He has been smoking about 2.00 packs per day. He has never used smokeless tobacco. He reports current alcohol use of about 1.0 standard drinks of alcohol per week. He reports current drug use. Drugs: Other-see comments and Marijuana.  Additional Social History:  Alcohol / Drug Use Pain Medications: Denies abuse Prescriptions: Denies abuse Over the Counter: Denies abuse History of alcohol / drug use?: Yes(History of abusing marijuana) Longest period of sobriety (when/how long): unknown  CIWA: CIWA-Ar BP: 125/88 Pulse Rate: 90 COWS:    Allergies:  Allergies  Allergen Reactions  . Peanut-Containing Drug Products Other (See Comments)    Pt reports "my throat swells and I have trouble swallowing"    Home Medications: (Not in a hospital admission)   OB/GYN Status:  No LMP for male patient.  General Assessment Data Location of Assessment: [facility] Assessment Services TTS Assessment: In system Is this a Tele or Face-to-Face Assessment?: Face-to-Face Is this an Initial Assessment or a Re-assessment for this encounter?: Initial Assessment Patient Accompanied by:: Parent Language Other than English: No Living Arrangements: Other (Comment)(Lives with mother and brother) What gender do you  identify as?: Male Marital status: Single Maiden name: NA Pregnancy Status: No Living Arrangements: Parent Can pt return to current living arrangement?: Yes Admission Status: Voluntary Is patient capable of signing voluntary admission?: Yes Referral Source: Self/Family/Friend Insurance type: Medicaid, BCBS  Medical Screening Exam ([facility] Walk-in ONLY) Medical Exam completed: Yes(Jason Allyson SabalBerry,  FNP)  Crisis Care Plan Living Arrangements: Parent Legal Guardian: Other:(Self) Name of Psychiatrist: [facility]  Name of Therapist: [facility]   Education Status Is patient currently in school?: No Highest grade of school patient has completed: 12th grade Is the patient employed, unemployed or receiving disability?: Unemployed  Risk to self with the past 6 months Suicidal Ideation: No Has patient been a risk to self within the past 6 months prior to admission? : Yes Suicidal Intent: No Has patient had any suicidal intent within the past 6 months prior to admission? : No Is patient at risk for suicide?: No Suicidal Plan?: No Has patient had any suicidal plan within the past 6 months prior to admission? : Yes Specify Current Suicidal Plan: Sherri RadHang himself with a belt Access to Means: No What has been your use of drugs/alcohol within the last 12 months?: Pt has history of using marijuana Previous Attempts/Gestures: Yes How many times?: 2 Other Self Harm Risks: None Triggers for Past Attempts: Unpredictable Intentional Self Injurious Behavior: None Family Suicide History: No Recent stressful life event(s): Loss (Comment)(Father died in May) Persecutory voices/beliefs?: No Depression: Yes Depression Symptoms: Despondent, Isolating, Loss of interest in usual pleasures Substance abuse history and/or treatment for substance abuse?: No Suicide prevention information given to non-admitted patients: Not applicable  Risk to Others within the past 6 months Homicidal Ideation: No Does patient have any lifetime risk of violence toward others beyond the six months prior to admission? : No Thoughts of Harm to Others: No Current Homicidal Intent: No Current Homicidal Plan: No Access to Homicidal Means: No Identified Victim: None History of harm to others?: No Assessment of Violence: None Noted Violent Behavior Description: No history of violence Does patient have access to weapons?:  No Criminal Charges Pending?: No Does patient have a court date: No Is patient on probation?: No  Psychosis Hallucinations: Auditory Delusions: None noted  Mental Status Report Appearance/Hygiene: Other (Comment)(Casually dressed) Eye Contact: Poor Motor Activity: Unremarkable Speech: Soft Level of Consciousness: Quiet/awake Mood: Sad Affect: Blunted Anxiety Level: None Thought Processes: Circumstantial Judgement: Impaired Orientation: Person Obsessive Compulsive Thoughts/Behaviors: None  Cognitive Functioning Concentration: Poor Memory: Unable to Assess Is patient IDD: No([name] reports Pt has history of learning problems) Insight: Poor Impulse Control: Poor Appetite: Poor Have you had any weight changes? : Loss Amount of the weight change? (lbs): (Unknown) Sleep: Decreased Total Hours of Sleep: (Unknown) Vegetative Symptoms: Decreased grooming  ADLScreening [facility] Assessment Services) Patient's cognitive ability adequate to safely complete daily activities?: No Patient able to express need for assistance with ADLs?: Yes Independently performs ADLs?: Yes (appropriate for developmental age)  Prior Inpatient Therapy Prior Inpatient Therapy: Yes Prior Therapy Dates: 2016, 2018, 2020 [facility] Reason for Treatment: Psychosis  Prior Outpatient Therapy Prior Outpatient Therapy: Yes Prior Therapy Dates: Ongoing  Prior Therapy Facilty/Provider(s): [facility]  Reason for Treatment: Medication management Does patient have an ACCT team?: No Does patient have Intensive In-House Services?  : No Does patient have [facility] services? : No Does patient have P4CC services?: No  ADL Screening (condition at time of admission) Patient's cognitive ability adequate to safely complete daily activities?: No  Is the patient deaf or have difficulty hearing?: No Does the patient have difficulty seeing, even when wearing  glasses/contacts?: No Does the patient have difficulty concentrating, remembering, or making decisions?: Yes Patient able to express need for assistance with ADLs?: Yes Does the patient have difficulty dressing or bathing?: No Independently performs ADLs?: Yes (appropriate for developmental age) Does the patient have difficulty walking or climbing stairs?: No Weakness of Legs: None Weakness of Arms/Hands: None  Home Assistive Devices/Equipment Home Assistive Devices/Equipment: None    Abuse/Neglect Assessment (Assessment to be complete while patient is alone) Abuse/Neglect Assessment Can Be Completed: Unable to assess, patient is non-responsive or altered mental status     Advance Directives (For Healthcare) Does Patient Have a Medical Advance Directive?: No Would patient like information on creating a medical advance directive?: No - Patient declined          Disposition: Gave clinical report to Lindon Romp, FNP who completed MSE and determined Pt meets criteria for inpatient psychiatric treatment. Lavell Luster, [facility] confirmed bed availability. Pt will be admitted to [address] pending COVID test.  Disposition Initial Assessment Completed for this Encounter: Yes Disposition of Patient: Admit  On Site Evaluation by:  Lindon Romp, FNP Reviewed with Physician:    Evelena Peat, [facility], [facility] Triage Specialist [phone]  Anson Fret, Orpah Greek 06/22/2019 1:20 AM

## 2019-06-22 NOTE — [facility] Suicide Risk Assessment (Signed)
[facility] Admission Suicide Risk Assessment   Nursing information obtained from:  Patient Demographic factors:  Male, Adolescent or young adult, Unemployed Current Mental Status:  Suicidal ideation indicated by patient Loss Factors:  NA Historical Factors:  Impulsivity Risk Reduction Factors:  Sense of responsibility to family, Positive social support, Living with another person, especially a relative, Positive coping skills or problem solving skills  Total Time spent with patient: 45 minutes Principal Problem: Schizoaffective disorder, depressive type ([place]) Diagnosis:  Principal Problem:   Schizoaffective disorder, depressive type ([place]) Active Problems:   Schizoaffective disorder, bipolar type (Gregory)  Subjective Data:   Continued Clinical Symptoms:  Alcohol Use Disorder Identification Test Final Score (AUDIT): 0 The "Alcohol Use Disorders Identification Test", Guidelines for Use in Primary Care, Second Edition.  World Pharmacologist [facility]). Score between 0-7:  no or low risk or alcohol related problems. Score between 8-15:  moderate risk of alcohol related problems. Score between 16-19:  high risk of alcohol related problems. Score 20 or above:  warrants further diagnostic evaluation for alcohol dependence and treatment.   CLINICAL FACTORS:  23 year old single male, presented to hospital in the company of his mother.  Mother reported that patient had been decompensating over the last 2 weeks, reportedly responding to internal stimuli, talking to self, with disorganized behavior such as going outside in the rain without shoes on, eating poorly and neglecting ADLs/hygiene. Patient presents depressed, blunted/flat in affect, with poor speech, answering most questions with monosyllables or short phrases.  Presents slowed, bradypsychic, with significant latency of speech noted.  Of note, he is alert, attentive and is oriented x3 at this time.  He describes feeling sad, but cannot identify  reason or trigger for worsening mood.  Endorses some neurovegetative symptoms, mainly poor sleep and some anhedonia.  Reports suicidal ideations with recent thoughts of overdosing.  He also acknowledges auditory hallucinations, but does not elaborate on content of these. When asked whether he has been taking his home psychiatric medications he responds "no" -cannot specify when he stopped meds (had been discharged on Haloperidol, Cogentin following his most recent admission in October/2020) Denies drug or alcohol abuse-admission UDS negative. Patient has a history of chronic mental illness, has been diagnosed with schizoaffective disorder in the past.   Patient is known to our unit from prior admissions, most recently on October 03/2019 at which time he Presented with suicidal ideations of overdosing, depression, flat affect. At that time was discharged on haloperidol 10 mg every morning, 20 mg nightly, Cogentin 1 mg twice daily, Trazodone 100 mg nightly. Dx- Schizoaffective Disorder/Depressed  Plan-inpatient admission. Started on Abilify 10 mg daily.  Based on history may benefit from [place] if he tolerates oral aripiprazole well.  I will also start Remeron at 7.5 mg nightly to help address depression, insomnia.       Musculoskeletal: Strength & Muscle Tone: within normal limits-no EPS, no cogwheeling, no stiffness noted Gait & Station: normal Patient leans: N/A  Psychiatric Specialty Exam: Physical Exam  ROS denies chest pain or shortness of breath, denies cough  Blood pressure 106/71, pulse (!) 122, temperature 98.2 F (36.8 C), temperature source Oral, resp. rate 18, height 6' (1.829 m), weight 111.1 kg, SpO2 100 %.Body mass index is 33.23 kg/m.  General Appearance: Fairly Groomed  Eye Contact:  Minimal  Speech:  Slow/poor, answers most questions with monosyllables or short phrases (+) increased latency of speech  Volume:  Decreased  Mood:  Depressed  Affect:   Flat/blunted  Thought Process:  Linear and Descriptions of Associations: Circumstantial-slow  Orientation:  Other:  He is alert and currently presents oriented to hospital//day a week/month/year  Thought Content:  Acknowledges auditory hallucinations, does not elaborate further, currently does not appear internally preoccupied.  No delusions are currently expressed  Suicidal Thoughts:  No contract for safety on unit, denies suicidal ideations at this time  Homicidal Thoughts:  No  Memory:  Recent and remote fair  Judgement:  Impaired  Insight:  Shallow  Psychomotor Activity:  Decreased  Concentration:  Concentration: Fair and Attention Span: Fair  Recall:  [employer] of Knowledge:  Fair  Language:  Fair  Akathisia:  Negative  Handed:  Right  AIMS (if indicated):     Assets:  Desire for Improvement Resilience  ADL's: Fair  Cognition:  WNL  Sleep:  Number of Hours: 0      COGNITIVE FEATURES THAT CONTRIBUTE TO RISK:  Closed-mindedness and Loss of executive function    SUICIDE RISK:   Moderate:  Frequent suicidal ideation with limited intensity, and duration, some specificity in terms of plans, no associated intent, good self-control, limited dysphoria/symptomatology, some risk factors present, and identifiable protective factors, including available and accessible social support.  PLAN OF CARE: Patient will be admitted to inpatient psychiatric unit for stabilization and safety. Will provide and encourage milieu participation. Provide medication management and maked adjustments as needed.  Will follow daily.    I certify that inpatient services furnished can reasonably be expected to improve the patient's condition.   Craige Cotta, MD 06/22/2019, 1:43 PM

## 2019-06-22 NOTE — Tx Team (Signed)
Initial Treatment Plan 06/22/2019 6:12 AM Marvin Crawford [id number]    PATIENT STRESSORS: Other: patient reports feeling depressed and worrying d/t covid-19   PATIENT STRENGTHS: Ability for insight Supportive family/friends   PATIENT IDENTIFIED PROBLEMS: Depression  SI    " To get better"               DISCHARGE CRITERIA:  Improved stabilization in mood, thinking, and/or behavior  PRELIMINARY DISCHARGE PLAN: Return to previous living arrangement  PATIENT/FAMILY INVOLVEMENT: This treatment plan has been presented to and reviewed with the patient, Marvin Crawford, and/or family member.  The patient and family have been given the opportunity to ask questions and make suggestions.  Aurora Mask, RN 06/22/2019, 6:12 AM

## 2019-06-22 NOTE — H&P (Addendum)
Psychiatric Admission Assessment Adult  Patient Identification: Marvin Crawford MRN:  [id number] Date of Evaluation:  06/22/2019 Chief Complaint:  Schizophrenia Principal Diagnosis: Schizoaffective disorder, depressive type ([place]) Diagnosis:  Principal Problem:   Schizoaffective disorder, depressive type ([place]) Active Problems:   Schizoaffective disorder, bipolar type ([place])  History of Present Illness: per assessment note: Marvin Crawford is an 23 y.o. single male who presents to [place] accompanied by his mother, [name] [phone], who participated in assessment. Pt has a diagnosis of schizophrenia and was discharged from [facility] 05/20/19. He cannot state where he is or why he is here. [name] reports Pt has been decompensating for the past two weeks. She says today he was sitting on the front steps of their residence talking to someone who wasn't there. She says asked who he was talking to and he said "my mother." She says Pt had urinated on himself. She says Pt has been not eating, awake at night and napping during the day, not caring for his hygiene and grooming and going outside in the cold rain with no shoes. She says he appears confused and at times disoriented. Pt gives yes or no responses and states he is not suicidal, homicidal or experiencing hallucinations but it is questionable whether Pt understands the questions. Pt states his mood is sad. Pt has a history of using marijuana.  Evaluation: [name] observed sitting on bench in his room.  Denying suicidal or homicidal ideations.  Patient was asked to reason for admission and he stated he was unsure.  Does admit to some auditory hallucinations however is unable to articulate sound and/or voices.  Patient is pleasant, calm and cooperative.  Reported he has been taking his medications as directed.  Patient is well-known to this service.  Will initiate home medications where appropriate.  Staff to continue to monitor for safety.   Support, encouragement and reassurance was provided.   Associated Signs/Symptoms: Depression Symptoms:  difficulty concentrating, anxiety, (Hypo) Manic Symptoms:  Distractibility, Hallucinations, Impulsivity, Irritable Mood, Anxiety Symptoms:  Excessive Worry, Psychotic Symptoms:  Hallucinations: Auditory PTSD Symptoms: NA Total Time spent with patient: 15 minutes  Past Psychiatric History: As noted per admission assessment pt is receiving outpatient medication management through his primary care physician and [facility]. He has been psychiatrically hospitalized at [facility], [place] and a facility in [place], [place].  Is the patient at risk to self? No.  Has the patient been a risk to self in the past 6 months? No.  Has the patient been a risk to self within the distant past? No.  Is the patient a risk to others? No.  Has the patient been a risk to others in the past 6 months? No.  Has the patient been a risk to others within the distant past? No.   Prior Inpatient Therapy: Prior Inpatient Therapy: Yes Prior Therapy Dates: 2016, 2018, 2020 Prior Therapy Facilty/Provider(s): [facility] and [facility] Reason for Treatment: Psychosis Prior Outpatient Therapy: Prior Outpatient Therapy: Yes Prior Therapy Dates: Ongoing  Prior Therapy Facilty/Provider(s): [facility]  Reason for Treatment: Medication management Does patient have an ACCT team?: No Does patient have Intensive In-House Services?  : No Does patient have [facility] services? : No Does patient have P4CC services?: No  Alcohol Screening: 1. How often do you have a drink containing alcohol?: Never 2. How many drinks containing alcohol do you have on a typical day when you are drinking?: 1 or 2 3. How often do you  have six or more drinks on one occasion?: Never AUDIT-C Score: 0 4. How often during the last year have you found that you were not able to stop drinking once you had started?: Never 5. How often during the last  year have you failed to do what was normally expected from you becasue of drinking?: Never 6. How often during the last year have you needed a first drink in the morning to get yourself going after a heavy drinking session?: Never 7. How often during the last year have you had a feeling of guilt of remorse after drinking?: Never 8. How often during the last year have you been unable to remember what happened the night before because you had been drinking?: Never 9. Have you or someone else been injured as a result of your drinking?: No 10. Has a relative or friend or a doctor or another health worker been concerned about your drinking or suggested you cut down?: No Alcohol Use Disorder Identification Test Final Score (AUDIT): 0 Alcohol Brief Interventions/Follow-up: AUDIT Score <7 follow-up not indicated Substance Abuse History in the last 12 months:  Yes.  Reported marijuana use Consequences of Substance Abuse: NA Previous Psychotropic Medications: No  Psychological Evaluations: No  Past Medical History:  Past Medical History:  Diagnosis Date  . ADD (attention deficit disorder)   . Anxiety   . Depression   . Environmental allergies   . Schizo-affective psychosis ([place])    History reviewed. No pertinent surgical history. Family History:  Family History  Problem Relation Age of Onset  . Hypertension Mother   . Hypertension Father   . Mental illness Neg Hx    Family Psychiatric  History: Tobacco Screening: Have you used any form of tobacco in the last 30 days? (Cigarettes, Smokeless Tobacco, Cigars, and/or Pipes): Yes Tobacco use, Select all that apply: cigar use, not daily Are you interested in Tobacco Cessation Medications?: No, patient refused Counseled patient on smoking cessation including recognizing danger situations, developing coping skills and basic information about quitting provided: Refused/Declined practical counseling Social History:  Social History   Substance and  Sexual Activity  Alcohol Use Yes  . Alcohol/week: 1.0 standard drinks  . Types: 1 Standard drinks or equivalent per week   Comment: "once in a blue moon"     Social History   Substance and Sexual Activity  Drug Use Yes  . Types: Other-see comments, Marijuana   Comment: oil smoked in pipe calls "[employer]", friends buy for him    Additional Social History: Marital status: Single    Pain Medications: Denies abuse Prescriptions: Denies abuse Over the Counter: Denies abuse History of alcohol / drug use?: Yes(History of abusing marijuana) Longest period of sobriety (when/how long): unknown                    Allergies:   Allergies  Allergen Reactions  . Peanut-Containing Drug Products Other (See Comments)    Pt reports "my throat swells and I have trouble swallowing"   Lab Results:  Results for orders placed or performed during the hospital encounter of 06/22/19 (from the past 48 hour(s))  Urine rapid drug screen (hosp performed)     Status: None   Collection Time: 06/22/19  2:21 AM  Result Value Ref Range   Opiates NONE DETECTED NONE DETECTED   Cocaine NONE DETECTED NONE DETECTED   Benzodiazepines NONE DETECTED NONE DETECTED   Amphetamines NONE DETECTED NONE DETECTED   Tetrahydrocannabinol NONE DETECTED NONE DETECTED  Barbiturates NONE DETECTED NONE DETECTED    Comment: (NOTE) DRUG SCREEN FOR MEDICAL PURPOSES ONLY.  IF CONFIRMATION IS NEEDED FOR ANY PURPOSE, NOTIFY LAB WITHIN 5 DAYS. LOWEST DETECTABLE LIMITS FOR URINE DRUG SCREEN Drug Class                     Cutoff (ng/mL) Amphetamine and metabolites    1000 Barbiturate and metabolites    200 Benzodiazepine                 264 Tricyclics and metabolites     300 Opiates and metabolites        300 Cocaine and metabolites        300 THC                            50 Performed at [facility], [place] [address], [place], [place] [address]   SARS Coronavirus 2 by RT PCR (hospital order, performed  in [facility] hospital lab) Nasopharyngeal Urine, Clean Catch     Status: None   Collection Time: 06/22/19  2:24 AM   Specimen: Urine, Clean Catch; Nasopharyngeal  Result Value Ref Range   SARS Coronavirus 2 NEGATIVE NEGATIVE    Comment: (NOTE) If result is NEGATIVE SARS-CoV-2 target nucleic acids are NOT DETECTED. The SARS-CoV-2 RNA is generally detectable in upper and lower  respiratory specimens during the acute phase of infection. The lowest  concentration of SARS-CoV-2 viral copies this assay can detect is 250  copies / mL. A negative result does not preclude SARS-CoV-2 infection  and should not be used as the sole basis for treatment or other  patient management decisions.  A negative result may occur with  improper specimen collection / handling, submission of specimen other  than nasopharyngeal swab, presence of viral mutation(s) within the  areas targeted by this assay, and inadequate number of viral copies  (<250 copies / mL). A negative result must be combined with clinical  observations, patient history, and epidemiological information. If result is POSITIVE SARS-CoV-2 target nucleic acids are DETECTED. The SARS-CoV-2 RNA is generally detectable in upper and lower  respiratory specimens dur ing the acute phase of infection.  Positive  results are indicative of active infection with SARS-CoV-2.  Clinical  correlation with patient history and other diagnostic information is  necessary to determine patient infection status.  Positive results do  not rule out bacterial infection or co-infection with other viruses. If result is PRESUMPTIVE POSTIVE SARS-CoV-2 nucleic acids MAY BE PRESENT.   A presumptive positive result was obtained on the submitted specimen  and confirmed on repeat testing.  While 2019 novel coronavirus  (SARS-CoV-2) nucleic acids may be present in the submitted sample  additional confirmatory testing may be necessary for epidemiological  and / or clinical  management purposes  to differentiate between  SARS-CoV-2 and other Sarbecovirus currently known to infect humans.  If clinically indicated additional testing with an alternate test  methodology [phone]) is advised. The SARS-CoV-2 RNA is generally  detectable in upper and lower respiratory sp ecimens during the acute  phase of infection. The expected result is Negative. Fact Sheet for Patients:  StrictlyIdeas.no Fact Sheet for Healthcare Providers: BankingDealers.co.za This test is not yet approved or cleared by the [place] FDA and has been authorized for detection and/or diagnosis of SARS-CoV-2 by FDA under an Emergency Use Authorization (EUA).  This EUA will remain in effect (meaning this  test can be used) for the duration of the COVID-19 declaration under Section 564(b)(1) of the Act, 21 U.S.C. section 360bbb-3(b)(1), unless the authorization is terminated or revoked sooner. Performed at [facility], [place] [address] [place], [place] [address]     Blood Alcohol level:  Lab Results  Component Value Date   [facility] <10 10/12/2018   ETH <10 76/73/4193    Metabolic Disorder Labs:  Lab Results  Component Value Date   HGBA1C 5.5 05/16/2019   MPG 111.15 05/16/2019   MPG 122.63 10/15/2018   Lab Results  Component Value Date   PROLACTIN 2.2 (L) 05/16/2019   PROLACTIN 3.4 (L) 10/15/2018   Lab Results  Component Value Date   CHOL 145 05/16/2019   TRIG 47 05/16/2019   HDL 38 (L) 05/16/2019   CHOLHDL 3.8 05/16/2019   VLDL 9 05/16/2019   LDLCALC 98 05/16/2019   LDLCALC 100 (H) 10/15/2018    Current Medications: Current Facility-Administered Medications  Medication Dose Route Frequency Provider Last Rate Last Dose  . acetaminophen (TYLENOL) tablet 650 mg  650 mg Oral Q6H PRN Rozetta Nunnery, NP      . alum & mag hydroxide-simeth (MAALOX/MYLANTA) 200-200-20 MG/5ML suspension 30 mL  30 mL Oral Q4H PRN Lindon Romp A, NP      . ARIPiprazole (ABILIFY) tablet 5 mg  5 mg Oral Daily Lewis, Tanika N, NP      . haloperidol (HALDOL) tablet 0.5 mg  0.5 mg Oral QHS Derrill Center, NP      . hydrOXYzine (ATARAX/VISTARIL) tablet 25 mg  25 mg Oral TID PRN Rozetta Nunnery, NP      . Derrill Memo ON 06/23/2019] influenza vac split quadrivalent PF (FLUARIX) injection 0.5 mL  0.5 mL Intramuscular Tomorrow-1000 Johnn Hai, MD      . magnesium hydroxide (MILK OF MAGNESIA) suspension 30 mL  30 mL Oral Daily PRN Lindon Romp A, NP      . traZODone (DESYREL) tablet 50 mg  50 mg Oral QHS,MR X 1 Derrill Center, NP       PTA Medications: Medications Prior to Admission  Medication Sig Dispense Refill Last Dose  . ARIPiprazole ER (ABILIFY MAINTENA) 400 MG SRER injection Inject 2 mLs (400 mg total) into the muscle every 28 (twenty-eight) days. 1 each 0 Past Week at Unknown time  . benztropine (COGENTIN) 1 MG tablet Take 1 tablet (1 mg total) by mouth 2 (two) times daily. 60 tablet 2 Past Week at Unknown time  . DESCOVY 200-25 MG tablet Take 1 tablet by mouth daily.   Past Week at Unknown time  . haloperidol (HALDOL) 10 MG tablet 1 in am 2 at h s 90 tablet 2 Past Week at Unknown time  . traZODone (DESYREL) 100 MG tablet Take 1 tablet (100 mg total) by mouth at bedtime as needed for sleep. 90 tablet 1 Past Week at Unknown time    Musculoskeletal: Strength & Muscle Tone: within normal limits Gait & Station: normal Patient leans: N/A  Psychiatric Specialty Exam: Physical Exam  Vitals reviewed. Constitutional: He is oriented to person, place, and time. He appears well-developed.  Neurological: He is alert and oriented to person, place, and time.    Review of Systems  Psychiatric/Behavioral: Positive for hallucinations. Negative for suicidal ideas.  All other systems reviewed and are negative.   Blood pressure 106/71, pulse (!) 122, temperature 98.2 F (36.8 C), temperature source Oral, resp. rate 18, height 6' (1.829 m),  weight  111.1 kg, SpO2 100 %.Body mass index is 33.23 kg/m.  General Appearance: Casual and Guarded  Eye Contact:  Fair  Speech:  Clear and Coherent and Slow  Volume:  Decreased  Mood:  Depressed  Affect:  Depressed and Flat  Thought Process:  Coherent  Orientation:  Full (Time, Place, and Person)  Thought Content:  Logical  Suicidal Thoughts:  No  Homicidal Thoughts:  No  Memory:  Immediate;   Fair Recent;   Fair  Judgement:  Fair  Insight:  Fair  Psychomotor Activity:  Normal  Concentration:  Concentration: Fair  Recall:  [employer] of Knowledge:  Fair  Language:  Fair  Akathisia:  No  Handed:  Right  AIMS (if indicated):     Assets:  Communication Skills Desire for Improvement Resilience Social Support  ADL's:  Intact  Cognition:  Impaired,  Mild  Sleep:  Number of Hours: 0    Treatment Plan Summary: Daily contact with patient to assess and evaluate symptoms and progress in treatment and Medication management  Observation Level/Precautions:  15 minute checks  Laboratory:  CBC Chemistry Profile UDS UA  Psychotherapy: Individual and group session  Medications: Restarted home medications where appropriate  Consultations: CSW and psychiatry  Discharge Concerns:  Safety, stabilization, and risk of access to medication and medication stabilization   Estimated LOS: 5-7 days  Other:     Physician Treatment Plan for Primary Diagnosis: Schizoaffective disorder, depressive type ([place]) Long Term Goal(s): Improvement in symptoms so as ready for discharge  Short Term Goals: Ability to identify changes in lifestyle to reduce recurrence of condition will improve, Ability to identify and develop effective coping behaviors will improve and Ability to maintain clinical measurements within normal limits will improve  Physician Treatment Plan for Secondary Diagnosis: Principal Problem:   Schizoaffective disorder, depressive type ([place]) Active Problems:   Schizoaffective disorder,  bipolar type ([place])  Long Term Goal(s): Improvement in symptoms so as ready for discharge  Short Term Goals: Ability to identify changes in lifestyle to reduce recurrence of condition will improve, Ability to verbalize feelings will improve, Ability to maintain clinical measurements within normal limits will improve and Ability to identify triggers associated with substance abuse/mental health issues will improve  I certify that inpatient services furnished can reasonably be expected to improve the patient's condition.    Derrill Center, NP 11/15/202011:04 AM   I have discussed case with NP and have met with patient  Agree with NP note and assessment  23 year old single male, presented to hospital in the company of his mother.  Mother reported that patient had been decompensating over the last 2 weeks, reportedly responding to internal stimuli, talking to self, with disorganized behavior such as going outside in the rain without shoes on, eating poorly and neglecting ADLs/hygiene. Patient presents depressed, blunted/flat in affect, with poor speech, answering most questions with monosyllables or short phrases.  Presents slowed, bradypsychic, with significant latency of speech noted.  Of note, he is alert, attentive and is oriented x3 at this time.  He describes feeling sad, but cannot identify reason or trigger for worsening mood.  Endorses some neurovegetative symptoms, mainly poor sleep and some anhedonia.  Reports suicidal ideations with recent thoughts of overdosing.  He also acknowledges auditory hallucinations, but does not elaborate on content of these. When asked whether he has been taking his home psychiatric medications he responds "no" -cannot specify when he stopped meds (had been discharged on Haloperidol, Cogentin following his most recent admission  in October/2020) Denies drug or alcohol abuse-admission UDS negative. Patient has a history of chronic mental illness, has been diagnosed  with schizoaffective disorder in the past.   Patient is known to our unit from prior admissions, most recently on October 03/2019 at which time he Presented with suicidal ideations of overdosing, depression, flat affect. At that time was discharged on haloperidol 10 mg every morning, 20 mg nightly, Cogentin 1 mg twice daily, Trazodone 100 mg nightly. Dx- Schizoaffective Disorder/Depressed  Plan-inpatient admission. Started on Abilify 10 mg daily.  Based on history may benefit from [place] if he tolerates oral aripiprazole well.  I will also start Remeron at 7.5 mg nightly to help address depression, insomnia.

## 2019-06-22 NOTE — [facility] Counselor (Signed)
Adult Comprehensive Assessment  Patient ID: Marvin Crawford, male   DOB: 04/24/1996, 23 y.o.   MRN: [id number]  Information Source: Information source: Patient  Current Stressors:  Patient states their primary concerns and needs for treatment are:: I have anxiety Patient states their goals for this hospitilization and ongoing recovery are:: "Stressed out by life" Educational / Learning stressors: Denies stressors Employment / Job issues: Pt has been unable to work since January, per mother, due to his mental health symptoms. Family Relationships: In the past pt has said family can be a stressor.  Right now, he just says he is "stressed out by life." Financial / Lack of resources (include bankruptcy): Nothing specific Housing / Lack of housing: Denies stressors Physical health (include injuries & life threatening diseases): Denies stressors Social relationships: Denies stressors Substance abuse: Denies substance use. Bereavement / Loss: Father died in 12-29-18.  Living/Environment/Situation:  Living Arrangements: Parent, Other relatives Living conditions (as described by patient or guardian): "Good" Who else lives in the home?: Mom and brother How long has patient lived in current situation?: "Most of my life" What is atmosphere in current home: Loving, Supportive  Family History:  Marital status: Single Are you sexually active?: No What is your sexual orientation?: Gay Has your sexual activity been affected by drugs, alcohol, medication, or emotional stress?: No Does patient have children?: No  Childhood History:  By whom was/is the patient raised?: Mother Additional childhood history information: Pt reports that his father was living somewhere else. Description of patient's relationship with caregiver when they were a child: "It wasn't that good but it was good" Patient's description of current relationship with people who raised him/her: Good with mother, father died in 2018-12-29 How were you disciplined when you got in trouble as a child/adolescent?: Whooping, time out, and toys taken away Does patient have siblings?: Yes Number of Siblings: 2 Description of patient's current relationship with siblings: 1 brother, 1 sister - good relationships Did patient suffer any verbal/emotional/physical/sexual abuse as a child?: No Did patient suffer from severe childhood neglect?: No Has patient ever been sexually abused/assaulted/raped as an adolescent or adult?: No Was the patient ever a victim of a crime or a disaster?: No Witnessed domestic violence?: No Has patient been effected by domestic violence as an adult?: No  Education:  Highest grade of school patient has completed: 12th grade Currently a student?: No Learning disability?: Yes What learning problems does patient have?: IEP plan through the school system.  Employment/Work Situation:   Employment situation: Unemployed What is the longest time patient has a held a job?: 8 years Where was the patient employed at that time?: [employer] Did You Receive Any Psychiatric Treatment/Services While in the [employer]?: (No Financial planner) Are There Guns or Other Weapons in Your Home?: No  Financial Resources:   Surveyor, quantity resources: Medicaid, Support from parents / caregiver Does patient have a Lawyer or guardian?: No  Alcohol/Substance Abuse:   What has been your use of drugs/alcohol within the last 12 months?: Social drinking and weekly marijuana use. Alcohol/Substance Abuse Treatment Hx: Denies past history Has alcohol/substance abuse ever caused legal problems?: No  Social Support System:   Patient's Community Support System: Good Describe Community Support System: Mom Type of faith/religion: Christian How does patient's faith help to cope with current illness?: "To stay focused on working"  Leisure/Recreation:   Leisure and Hobbies: Gardening and listening to  music  Strengths/Needs:   What is the patient's perception  of their strengths?: "I don't know" Patient states they can use these personal strengths during their treatment to contribute to their recovery: N/A Patient states these barriers may affect/interfere with their treatment: "I don't have any friends" Patient states these barriers may affect their return to the community: None Other important information patient would like considered in planning for their treatment: None  Discharge Plan:   Currently receiving community mental health services: Yes (From [facility]om)([facility]) Patient states concerns and preferences for aftercare planning are: Return to [facility] Patient states they will know when they are safe and ready for discharge when: Not sure, has a lot of thought blocking and cannot seem to process this question. Does patient have access to transportation?: Yes Does patient have financial barriers related to discharge medications?: No Patient description of barriers related to discharge medications: Has Medicaid and parental support Will patient be returning to same living situation after discharge?: Yes  Summary/Recommendations:   Summary and Recommendations (to be completed by the evaluator): Patient is a 23yo male admitted with decompensation for the last 2 weeks, talking to people who are there, urinating on himself, not eating, delusions, and disorientation.  This is his third admission to [facility] in 2020, and he has also been in the Observation Unit twice.  He has also been hospitalized at [facility] and a facility in [place].  He has a history of Schizophrenia and was discharged from [facility] 05/20/2019.  He has stopped taking care of his personal hygiene and will go outside in cold rain barefoot.  He drinks socially and smokes marijuana weekly.  He has not been able to work since his mental health symptoms worsened in January.  He lives with mother and brother.  He  receives medication management from [employer].  Patient will benefit from crisis stabilization, medication evaluation, group therapy and psychoeducation, in addition to case management for discharge planning. At discharge it is recommended that Patient adhere to the established discharge plan and continue in treatment.  Maretta Los. 06/22/2019

## 2019-06-22 NOTE — Progress Notes (Signed)
Writer asked by nursing staff to come assess patient rash.  Patient reports having this rash for quite some time, and unable to state exactly how long he has had the rash. Patient is a poor historian an unable to forward much information regarding the rash and its characteristics. Examination reveals a widespead rash that is distrubuted throughout the trunk, exteremities, face, scalp and groin area. Rash has scattered lesion in a christmas tree distrubution pattern with several areas of erythema and hyperpigmentation. Rash on his extremities is more generalized with papulosquamous red flat lesion with some hyperpigmentation and excoriation noted due to itching. Rash appears to be consistent with pityriasis rosea, however due to how long patient has had the rash this maybe ruled out. Close differential due to likelihood and timing of the rash could be guttae psoriasis ( obtained ASO titer) . Either way as it appears this rash should be treated with systemic corticosteroids, topical steroid,antihistamines and calamine lotion. Can not exclude syphilis (obtiained RPR and HIV)), viral exanthem. It does not appear that patient has started any new medications at this time that could contribute to such a rash to include (Lamictal, Tegretol, clozaril , DMARDS, or anti-virals). Will order prednisone 20mg  po BID x 5 days, Triamcinolone 0.1% apply to affected area BID, calamine lotion for pruritus management, and Claritin 10mg  po qhs. Please apply topicals in this order. Calamine lotion and then Triamcinolone ointment. May add Eucerin as a barrier however should be applied to skin all over not just lesions.  Patient may benefit from use of Mositurizing soaps that are not as abrasive and drying to the skin Hulan Fray, [place]).

## 2019-06-23 LAB — RPR: RPR Ser Ql: NONREACTIVE

## 2019-06-23 MED ORDER — HALOPERIDOL 5 MG PO TABS
10.0000 mg | ORAL_TABLET | Freq: Two times a day (BID) | ORAL | Status: DC
  Administered 2019-06-23: 10 mg via ORAL
  Filled 2019-06-23 (×4): qty 2

## 2019-06-23 NOTE — Progress Notes (Signed)
Pt refused Haldol earlier, but pt stated he would take it this evening , so it was given per [facility]

## 2019-06-23 NOTE — Progress Notes (Signed)
Recreation Therapy Notes  Date: 11.16.20 Time: 1000 Location: 500 Hall Dayroom  Group Topic: Anxiety  Goal Area(s) Addresses:  Patient will identify triggers to anxiety. Patient will identify physical symptoms and thoughts when anxious. Patient will identify coping skills for dealing with anxiety.  Intervention: Worksheet, pencils  Activity: Introduction of Anxiety.  Patients were to identify three things that trigger them.  Patients were to also identify the physical symptoms they experience when anxious, the thoughts they experience when anxious and the coping skills they use to deal with anxiety.  Education: Communication, Discharge Planning  Education Outcome: Acknowledges understanding/In group clarification offered/Needs additional education.   Clinical Observations/Feedback: Pt did not attend group.     Rey Dansby, LRT/CTRS         Cassity Christian A 06/23/2019 11:40 AM 

## 2019-06-23 NOTE — Tx Team (Signed)
Interdisciplinary Treatment and Diagnostic Plan Update  06/23/2019 Time of Session: 10:40am Marvin Crawford MRN: [id number]  Principal Diagnosis: Schizoaffective disorder, depressive type [facility])  Secondary Diagnoses: Principal Problem:   Schizoaffective disorder, depressive type ([place]) Active Problems:   Schizoaffective disorder, bipolar type ([place])   Current Medications:  Current Facility-Administered Medications  Medication Dose Route Frequency Provider Last Rate Last Dose  . acetaminophen (TYLENOL) tablet 650 mg  650 mg Oral Q6H PRN Lindon Romp A, NP      . alum & mag hydroxide-simeth (MAALOX/MYLANTA) 200-200-20 MG/5ML suspension 30 mL  30 mL Oral Q4H PRN Rozetta Nunnery, NP      . calamine lotion   Topical BID Suella Broad, FNP      . haloperidol (HALDOL) tablet 10 mg  10 mg Oral BID Johnn Hai, MD      . hydrocerin (EUCERIN) cream   Topical BID Derrill Center, NP      . hydrocortisone cream 1 %   Topical BID Derrill Center, NP      . hydrOXYzine (ATARAX/VISTARIL) tablet 25 mg  25 mg Oral TID PRN Rozetta Nunnery, NP   25 mg at 06/22/19 2128  . loratadine (CLARITIN) tablet 10 mg  10 mg Oral Daily Suella Broad, FNP   10 mg at 06/23/19 0272  . magnesium hydroxide (MILK OF MAGNESIA) suspension 30 mL  30 mL Oral Daily PRN Lindon Romp A, NP      . mirtazapine (REMERON) tablet 7.5 mg  7.5 mg Oral QHS Cobos, Myer Peer, MD   7.5 mg at 06/22/19 2116  . predniSONE (DELTASONE) tablet 20 mg  20 mg Oral BID WC Suella Broad, FNP   20 mg at 06/23/19 5366  . triamcinolone ointment (KENALOG) 0.1 %   Topical BID Suella Broad, FNP       PTA Medications: Medications Prior to Admission  Medication Sig Dispense Refill Last Dose  . ARIPiprazole ER (ABILIFY MAINTENA) 400 MG SRER injection Inject 2 mLs (400 mg total) into the muscle every 28 (twenty-eight) days. 1 each 0 Past Week at Unknown time  . benztropine (COGENTIN) 1 MG tablet Take 1 tablet (1 mg total) by  mouth 2 (two) times daily. 60 tablet 2 Past Week at Unknown time  . DESCOVY 200-25 MG tablet Take 1 tablet by mouth daily.   Past Week at Unknown time  . haloperidol (HALDOL) 10 MG tablet 1 in am 2 at h s 90 tablet 2 Past Week at Unknown time  . traZODone (DESYREL) 100 MG tablet Take 1 tablet (100 mg total) by mouth at bedtime as needed for sleep. 90 tablet 1 Past Week at Unknown time    Patient Stressors: Other: patient reports feeling depressed and worrying d/t covid-19  Patient Strengths: Ability for insight Supportive family/friends  Treatment Modalities: Medication Management, Group therapy, Case management,  1 to 1 session with clinician, Psychoeducation, Recreational therapy.   Physician Treatment Plan for Primary Diagnosis: Schizoaffective disorder, depressive type ([place]) Long Term Goal(s): Improvement in symptoms so as ready for discharge Improvement in symptoms so as ready for discharge   Short Term Goals: Ability to identify changes in lifestyle to reduce recurrence of condition will improve Ability to identify and develop effective coping behaviors will improve Ability to maintain clinical measurements within normal limits will improve Ability to identify changes in lifestyle to reduce recurrence of condition will improve Ability to verbalize feelings will improve Ability to maintain clinical measurements within normal limits  will improve Ability to identify triggers associated with substance abuse/mental health issues will improve  Medication Management: Evaluate patient's response, side effects, and tolerance of medication regimen.  Therapeutic Interventions: 1 to 1 sessions, Unit Group sessions and Medication administration.  Evaluation of Outcomes: Progressing  Physician Treatment Plan for Secondary Diagnosis: Principal Problem:   Schizoaffective disorder, depressive type ([facility]) Active Problems:   Schizoaffective disorder, bipolar type ([facility])  Long Term Goal(s):  Improvement in symptoms so as ready for discharge Improvement in symptoms so as ready for discharge   Short Term Goals: Ability to identify changes in lifestyle to reduce recurrence of condition will improve Ability to identify and develop effective coping behaviors will improve Ability to maintain clinical measurements within normal limits will improve Ability to identify changes in lifestyle to reduce recurrence of condition will improve Ability to verbalize feelings will improve Ability to maintain clinical measurements within normal limits will improve Ability to identify triggers associated with substance abuse/mental health issues will improve     Medication Management: Evaluate patient's response, side effects, and tolerance of medication regimen.  Therapeutic Interventions: 1 to 1 sessions, Unit Group sessions and Medication administration.  Evaluation of Outcomes: Progressing   RN Treatment Plan for Primary Diagnosis: Schizoaffective disorder, depressive type ([facility]) Long Term Goal(s): Knowledge of disease and therapeutic regimen to maintain health will improve  Short Term Goals: Ability to participate in decision making will improve, Ability to verbalize feelings will improve, Ability to disclose and discuss suicidal ideas, Ability to identify and develop effective coping behaviors will improve and Compliance with prescribed medications will improve  Medication Management: RN will administer medications as ordered by provider, will assess and evaluate patient's response and provide education to patient for prescribed medication. RN will report any adverse and/or side effects to prescribing provider.  Therapeutic Interventions: 1 on 1 counseling sessions, Psychoeducation, Medication administration, Evaluate responses to treatment, Monitor vital signs and CBGs as ordered, Perform/monitor CIWA, COWS, AIMS and Fall Risk screenings as ordered, Perform wound care treatments as  ordered.  Evaluation of Outcomes: Progressing   LCSW Treatment Plan for Primary Diagnosis: Schizoaffective disorder, depressive type (H[facility]) Long Term Goal(s): Safe transition to appropriate next level of care at discharge, Engage patient in therapeutic group addressing interpersonal concerns.  Short Term Goals: Engage patient in aftercare planning with referrals and resources and Increase skills for wellness and recovery  Therapeutic Interventions: Assess for all discharge needs, 1 to 1 time with Social worker, Explore available resources and support systems, Assess for adequacy in community support network, Educate family and significant other(s) on suicide prevention, Complete Psychosocial Assessment, Interpersonal group therapy.  Evaluation of Outcomes: Progressing   Progress in Treatment: Attending groups: No. Participating in groups: No. Taking medication as prescribed: Yes. Toleration medication: Yes. Family/Significant other contact made: No, will contact:  pt's mother Patient understands diagnosis: No. Discussing patient identified problems/goals with staff: Yes. Medical problems stabilized or resolved: Yes. Denies suicidal/homicidal ideation: Yes. Issues/concerns per patient self-inventory: No. Other:   New problem(s) identified: No, Describe:  None  New Short Term/Long Term Goal(s): Medication stabilization, elimination of SI thoughts, and development of a comprehensive mental wellness plan.   Patient Goals:  Patient did not state a goal  Discharge Plan or Barriers: Patient agreed to do an ACTT team. CSW will look into ACTT teams due to the patient's number of hospitalizations and symptomology.   Reason for Continuation of Hospitalization: Hallucinations Medication stabilization  Estimated Length of Stay: 2-3 days   Attendees: Patient: Marvin Crawford  06/23/2019   Physician: Dr. Malvin JohnsBrian Farah, MD 06/23/2019   Nursing: Luanne BrasRoni, RN 06/23/2019   RN Care Manager: 06/23/2019    Social Worker: Stephannie PetersJasmine Cope Marte, LCSW  06/23/2019  Recreational Therapist:  06/23/2019   Other:  06/23/2019   Other:  06/23/2019   Other: 06/23/2019      Scribe for Treatment Team: Delphia GratesJasmine M Jalani Cullifer, LCSW 06/23/2019 1:20 PM

## 2019-06-23 NOTE — Progress Notes (Signed)
Recreation Therapy Notes  Patient admitted to unit 11.15.20. Due to admission within last year, no new assessment conducted at this time. Last assessment conducted 10.8.20. Patient reports no changes in stressors from previous admission.  Patient reports reason for admission is depression.  Patient identified coping skills as playing sports, aggression, prayer, talk, art and read.  Pt stated strengths continue to be good communication and area of improvement remains reading.   Patient denies SI, HI, AVH at this time. Patient reports goal going home.     [name], LRT/CTRS   [name] A 06/23/2019 12:02 PM

## 2019-06-23 NOTE — Progress Notes (Signed)
   06/23/19 0545  What Happened  Was fall witnessed? No

## 2019-06-23 NOTE — Progress Notes (Signed)
   06/22/19 2130  Psych Admission Type (Psych Patients Only)  Admission Status Voluntary  Psychosocial Assessment  Patient Complaints None  Eye Contact Avoids  Facial Expression Blank;Flat  Affect Blunted;Depressed  Speech Logical/coherent  Interaction No initiation  Motor Activity Other (Comment) (wnl)  Appearance/Hygiene In hospital gown  Behavior Characteristics Cooperative;Anxious  Mood Anxious;Preoccupied;Pleasant  Aggressive Behavior  Effect No apparent injury  Thought Process  Coherency WDL  Content WDL  Delusions None reported or observed  Perception WDL  Hallucination Auditory  Judgment Impaired  Confusion None  Danger to Self  Current suicidal ideation? Denies  Self-Injurious Behavior No self-injurious ideation or behavior indicators observed or expressed   Agreement Not to Harm Self Yes  Danger to Others  Danger to Others None reported or observed  Danger to Others Abnormal  Harmful Behavior to others No threats or harm toward other people  Destructive Behavior No threats or harm toward property

## 2019-06-23 NOTE — Progress Notes (Signed)
[facility] MD Progress Note  06/23/2019 10:43 AM Marvin Crawford  MRN:  [id number] Subjective:    Patient well-known to the service, family report recent deterioration and possible hallucinations  Patient denies all these issues states he "does not want to hurt himself" and lobbies for discharge yet needs adjustments in medication due to recent decompensation according to mother.  Again patient minimizes issues  No EPS or TD no thoughts of harming self or others  Some poverty of content of speech and thought  Principal Problem: Schizoaffective disorder, depressive type (HCC) Diagnosis: Principal Problem:   Schizoaffective disorder, depressive type (HCC) Active Problems:   Schizoaffective disorder, bipolar type (HCC)  Total Time spent with patient: 20 minutes  Past Psychiatric History: see eval  Past Medical History:  Past Medical History:  Diagnosis Date  . ADD (attention deficit disorder)   . Anxiety   . Depression   . Environmental allergies   . Schizo-affective psychosis (HCC)    History reviewed. No pertinent surgical history. Family History:  Family History  Problem Relation Age of Onset  . Hypertension Mother   . Hypertension Father   . Mental illness Neg Hx    Family Psychiatric  History: see eval Social History:  Social History   Substance and Sexual Activity  Alcohol Use Yes  . Alcohol/week: 1.0 standard drinks  . Types: 1 Standard drinks or equivalent per week   Comment: "once in a blue moon"     Social History   Substance and Sexual Activity  Drug Use Yes  . Types: Other-see comments, Marijuana   Comment: oil smoked in pipe calls "DAB", friends buy for him    Social History   Socioeconomic History  . Marital status: Single    Spouse name: Not on file  . Number of children: Not on file  . Years of education: Not on file  . Highest education level: Not on file  Occupational History  . Not on file  Social Needs  . Financial resource strain: Patient  refused  . Food insecurity    Worry: Patient refused    Inability: Patient refused  . Transportation needs    Medical: Patient refused    Non-medical: Patient refused  Tobacco Use  . Smoking status: Current Every Day Smoker    Packs/day: 2.00    Start date: 02/05/2019  . Smokeless tobacco: Never Used  Substance and Sexual Activity  . Alcohol use: Yes    Alcohol/week: 1.0 standard drinks    Types: 1 Standard drinks or equivalent per week    Comment: "once in a blue moon"  . Drug use: Yes    Types: Other-see comments, Marijuana    Comment: oil smoked in pipe calls "DAB", friends buy for him  . Sexual activity: Yes    Birth control/protection: Condom  Lifestyle  . Physical activity    Days per week: Patient refused    Minutes per session: Patient refused  . Stress: Not on file  Relationships  . Social Musician on phone: Patient refused    Gets together: Patient refused    Attends religious service: Patient refused    Active member of club or organization: Patient refused    Attends meetings of clubs or organizations: Patient refused    Relationship status: Patient refused  Other Topics Concern  . Not on file  Social History Narrative   Graduated from high school at age 49. He took IEP classes until he was in 10th grade.  In 11th and 12th grade, he took normal classes and says he didn't need any extra help. He did 2 months of college at [employer], and this was difficult for him. He dropped out of college, due to difficulty balancing work and school.    Additional Social History:    Pain Medications: Denies abuse Prescriptions: Denies abuse Over the Counter: Denies abuse History of alcohol / drug use?: Yes(History of abusing marijuana) Longest period of sobriety (when/how long): unknown                    Sleep: Fair  Appetite:  Fair  Current Medications: Current Facility-Administered Medications  Medication Dose Route Frequency Provider Last Rate Last  Dose  . acetaminophen (TYLENOL) tablet 650 mg  650 mg Oral Q6H PRN Lindon Romp A, NP      . alum & mag hydroxide-simeth (MAALOX/MYLANTA) 200-200-20 MG/5ML suspension 30 mL  30 mL Oral Q4H PRN Lindon Romp A, NP      . ARIPiprazole (ABILIFY) tablet 10 mg  10 mg Oral Daily Cobos, Myer Peer, MD   10 mg at 06/23/19 0833  . calamine lotion   Topical BID Starkes-Perry, Gayland Curry, FNP      . hydrocerin (EUCERIN) cream   Topical BID Derrill Center, NP      . hydrocortisone cream 1 %   Topical BID Derrill Center, NP      . hydrOXYzine (ATARAX/VISTARIL) tablet 25 mg  25 mg Oral TID PRN Rozetta Nunnery, NP   25 mg at 06/22/19 2128  . influenza vac split quadrivalent PF (FLUARIX) injection 0.5 mL  0.5 mL Intramuscular Tomorrow-1000 Johnn Hai, MD      . loratadine (CLARITIN) tablet 10 mg  10 mg Oral Daily Suella Broad, FNP   10 mg at 06/23/19 3474  . magnesium hydroxide (MILK OF MAGNESIA) suspension 30 mL  30 mL Oral Daily PRN Lindon Romp A, NP      . mirtazapine (REMERON) tablet 7.5 mg  7.5 mg Oral QHS Cobos, Myer Peer, MD   7.5 mg at 06/22/19 2116  . predniSONE (DELTASONE) tablet 20 mg  20 mg Oral BID WC Suella Broad, FNP   20 mg at 06/23/19 2595  . triamcinolone ointment (KENALOG) 0.1 %   Topical BID Suella Broad, FNP        Lab Results:  Results for orders placed or performed during the hospital encounter of 06/22/19 (from the past 48 hour(s))  Urine rapid drug screen (hosp performed)     Status: None   Collection Time: 06/22/19  2:21 AM  Result Value Ref Range   Opiates NONE DETECTED NONE DETECTED   Cocaine NONE DETECTED NONE DETECTED   Benzodiazepines NONE DETECTED NONE DETECTED   Amphetamines NONE DETECTED NONE DETECTED   Tetrahydrocannabinol NONE DETECTED NONE DETECTED   Barbiturates NONE DETECTED NONE DETECTED    Comment: (NOTE) DRUG SCREEN FOR MEDICAL PURPOSES ONLY.  IF CONFIRMATION IS NEEDED FOR ANY PURPOSE, NOTIFY LAB WITHIN 5 DAYS. LOWEST DETECTABLE  LIMITS FOR URINE DRUG SCREEN Drug Class                     Cutoff (ng/mL) Amphetamine and metabolites    1000 Barbiturate and metabolites    200 Benzodiazepine                 638 Tricyclics and metabolites     300 Opiates and metabolites  300 Cocaine and metabolites        300 THC                            50 Performed at [facility], [address] [place], [place] [address]   SARS Coronavirus 2 by RT PCR (hospital order, performed in [facility] hospital lab) Nasopharyngeal Urine, Clean Catch     Status: None   Collection Time: 06/22/19  2:24 AM   Specimen: Urine, Clean Catch; Nasopharyngeal  Result Value Ref Range   SARS Coronavirus 2 NEGATIVE NEGATIVE    Comment: (NOTE) If result is NEGATIVE SARS-CoV-2 target nucleic acids are NOT DETECTED. The SARS-CoV-2 RNA is generally detectable in upper and lower  respiratory specimens during the acute phase of infection. The lowest  concentration of SARS-CoV-2 viral copies this assay can detect is 250  copies / mL. A negative result does not preclude SARS-CoV-2 infection  and should not be used as the sole basis for treatment or other  patient management decisions.  A negative result may occur with  improper specimen collection / handling, submission of specimen other  than nasopharyngeal swab, presence of viral mutation(s) within the  areas targeted by this assay, and inadequate number of viral copies  (<250 copies / mL). A negative result must be combined with clinical  observations, patient history, and epidemiological information. If result is POSITIVE SARS-CoV-2 target nucleic acids are DETECTED. The SARS-CoV-2 RNA is generally detectable in upper and lower  respiratory specimens dur ing the acute phase of infection.  Positive  results are indicative of active infection with SARS-CoV-2.  Clinical  correlation with patient history and other diagnostic information is  necessary to determine patient  infection status.  Positive results do  not rule out bacterial infection or co-infection with other viruses. If result is PRESUMPTIVE POSTIVE SARS-CoV-2 nucleic acids MAY BE PRESENT.   A presumptive positive result was obtained on the submitted specimen  and confirmed on repeat testing.  While 2019 novel coronavirus  (SARS-CoV-2) nucleic acids may be present in the submitted sample  additional confirmatory testing may be necessary for epidemiological  and / or clinical management purposes  to differentiate between  SARS-CoV-2 and other Sarbecovirus currently known to infect humans.  If clinically indicated additional testing with an alternate test  methodology [id number]) is advised. The SARS-CoV-2 RNA is generally  detectable in upper and lower respiratory sp ecimens during the acute  phase of infection. The expected result is Negative. Fact Sheet for Patients:  BoilerBrush.com.cyhttps://www.fda.gov/media/136312/download Fact Sheet for Healthcare Providers: https://pope.com/https://www.fda.gov/media/136313/download This test is not yet approved or cleared by the [place] FDA and has been authorized for detection and/or diagnosis of SARS-CoV-2 by FDA under an Emergency Use Authorization (EUA).  This EUA will remain in effect (meaning this test can be used) for the duration of the COVID-19 declaration under Section 564(b)(1) of the Act, 21 U.S.C. section 360bbb-3(b)(1), unless the authorization is terminated or revoked sooner. Performed at [facility], [address] [place], [place] [address]   CBC     Status: None   Collection Time: 06/22/19  6:13 PM  Result Value Ref Range   WBC 5.1 4.0 - 10.5 K/uL   RBC 5.02 4.22 - 5.81 MIL/uL   Hemoglobin 14.5 13.0 - 17.0 g/dL   HCT 29.544.1 28.439.0 - 13.252.0 %   MCV 87.8 80.0 - 100.0 fL   MCH 28.9 26.0 - 34.0  pg   MCHC 32.9 30.0 - 36.0 g/dL   RDW 16.1 09.6 - 04.5 %   Platelets 292 150 - 400 K/uL   nRBC 0.0 0.0 - 0.2 %    Comment: Performed at [facility], [address], [place], [place] [address]  Comprehensive metabolic panel     Status: None   Collection Time: 06/22/19  6:13 PM  Result Value Ref Range   Sodium 141 135 - 145 mmol/L   Potassium 3.5 3.5 - 5.1 mmol/L   Chloride 104 98 - 111 mmol/L   CO2 26 22 - 32 mmol/L   Glucose, Bld 99 70 - 99 mg/dL   BUN 14 6 - 20 mg/dL   Creatinine, Ser 1.91 0.61 - 1.24 mg/dL   Calcium 47.8 8.9 - 29.5 mg/dL   Total Protein 8.1 6.5 - 8.1 g/dL   Albumin 5.0 3.5 - 5.0 g/dL   AST 29 15 - 41 U/L   ALT 39 0 - 44 U/L   Alkaline Phosphatase 76 38 - 126 U/L   Total Bilirubin 0.8 0.3 - 1.2 mg/dL   GFR calc non Af Amer >60 >60 mL/min   GFR calc Af Amer >60 >60 mL/min   Anion gap 11 5 - 15    Comment: Performed at [facility], [address], [place], [place] [address]  Lipid panel     Status: Abnormal   Collection Time: 06/22/19  6:13 PM  Result Value Ref Range   Cholesterol 190 0 - 200 mg/dL   Triglycerides 60 <865 mg/dL   HDL 41 >78 mg/dL   Total CHOL/HDL Ratio 4.6 RATIO   VLDL 12 0 - 40 mg/dL   LDL Cholesterol 469 (H) 0 - 99 mg/dL    Comment:        Total Cholesterol/HDL:CHD Risk Coronary Heart Disease Risk Table                     Men   Women  1/2 Average Risk   3.4   3.3  Average Risk       5.0   4.4  2 X Average Risk   9.6   7.1  3 X Average Risk  23.4   11.0        Use the calculated Patient Ratio above and the CHD Risk Table to determine the patient's CHD Risk.        ATP III CLASSIFICATION (LDL):  <100     mg/dL   Optimal  629-528  mg/dL   Near or Above                    Optimal  130-159  mg/dL   Borderline  413-244  mg/dL   High  >010     mg/dL   Very High Performed at [facility], [address], [place], [place] [address]   TSH     Status: None   Collection Time: 06/22/19  6:13 PM  Result Value Ref Range   TSH 1.012 0.350 - 4.500 uIU/mL    Comment: Performed by a 3rd Generation assay with a functional  sensitivity of <=0.01 uIU/mL. Performed at [facility], [address] [place], [place] [address]   Rapid HIV screen (HIV 1/2 Ab+Ag)     Status: None   Collection Time: 06/22/19  6:13 PM  Result Value Ref Range   HIV-1 P24 Antigen - HIV24 NON REACTIVE NON REACTIVE  Comment: (NOTE) Detection of p24 may be inhibited by biotin in the sample, causing false negative results in acute infection.    HIV 1/2 Antibodies NON REACTIVE NON REACTIVE   Interpretation (HIV Ag Ab)      A non reactive test result means that HIV 1 or HIV 2 antibodies and HIV 1 p24 antigen were not detected in the specimen.    Comment: RESULT CALLED TO, READ BACK BY AND VERIFIED WITH: LIZ B.,RN [id number] @2028  BY V.WILKINS Performed at [facility], [address], [place], [place]     Blood Alcohol level:  Lab Results  Component Value Date   [facility] <10 10/12/2018   ETH <10 08/17/2017    Metabolic Disorder Labs: Lab Results  Component Value Date   HGBA1C 5.5 05/16/2019   MPG 111.15 05/16/2019   MPG 122.63 10/15/2018   Lab Results  Component Value Date   PROLACTIN 2.2 (L) 05/16/2019   PROLACTIN 3.4 (L) 10/15/2018   Lab Results  Component Value Date   CHOL 190 06/22/2019   TRIG 60 06/22/2019   HDL 41 06/22/2019   CHOLHDL 4.6 06/22/2019   VLDL 12 06/22/2019   LDLCALC 137 (H) 06/22/2019   LDLCALC 98 05/16/2019    Physical Findings: AIMS: Facial and Oral Movements Muscles of Facial Expression: None, normal Lips and Perioral Area: None, normal Jaw: None, normal Tongue: None, normal,Extremity Movements Upper (arms, wrists, hands, fingers): None, normal Lower (legs, knees, ankles, toes): None, normal, Trunk Movements Neck, shoulders, hips: None, normal, Overall Severity Severity of abnormal movements (highest score from questions above): None, normal Incapacitation due to abnormal movements: None, normal Patient's awareness of abnormal movements (rate  only patient's report): No Awareness, Dental Status Current problems with teeth and/or dentures?: No Does patient usually wear dentures?: No  CIWA:    COWS:     Musculoskeletal: Strength & Muscle Tone: within normal limits Gait & Station: normal Patient leans: N/A  Psychiatric Specialty Exam: Physical Exam  ROS  Blood pressure 125/88, pulse (!) 114, temperature (!) 97.5 F (36.4 C), temperature source Oral, resp. rate 18, height 6' (1.829 m), weight 111.1 kg, SpO2 100 %.Body mass index is 33.23 kg/m.  General Appearance: Guarded  Eye Contact:  Poor  Speech:  Slow  Volume:  a bit low  Mood:  Dysphoric  Affect:  Constricted  Thought Process:  Descriptions of Associations: Circumstantial  Orientation:  Full (Time, Place, and Person)  Thought Content:  Tangential  Suicidal Thoughts:  No  Homicidal Thoughts:  No  Memory:  Immediate;   Fair Recent;   Fair Remote;   Fair  Judgement:  Fair  Insight:  Fair  Psychomotor Activity:  Normal  Concentration:  Concentration: Fair and Attention Span: Fair  Recall:  03-11-1985 of Knowledge:  Fair  Language:  Fair  Akathisia:  Negative  Handed:  Right  AIMS (if indicated):     Assets:  Leisure Time Physical Health  ADL's:  Intact  Cognition:  WNL  Sleep:  Number of Hours: 5.75     Treatment Plan Summary: Daily contact with patient to assess and evaluate symptoms and progress in treatment and Medication management  Continue to adjust antipsychotics, continue to monitor for safety again patient minimizes symptoms and we will monitor for worsening and in particular hallucinations.  No change in precautions  Marcel Sorter, MD 06/23/2019, 10:43 AM

## 2019-06-23 NOTE — BHH Group Notes (Signed)
[place] LCSW Group Therapy Note  Date/Time: 06/23/2019 @ 1:30pm  Type of Therapy and Topic:  Group Therapy:  Overcoming Obstacles  Participation Level:  [facility] PARTICIPATION LEVEL: Minimal  Description of Group:    In this group patients will be encouraged to explore what they see as obstacles to their own wellness and recovery. They will be guided to discuss their thoughts, feelings, and behaviors related to these obstacles. The group will process together ways to cope with barriers, with attention given to specific choices patients can make. Each patient will be challenged to identify changes they are motivated to make in order to overcome their obstacles. This group will be process-oriented, with patients participating in exploration of their own experiences as well as giving and receiving support and challenge from other group members.  Therapeutic Goals: 1. Patient will identify personal and current obstacles as they relate to admission. 2. Patient will identify barriers that currently interfere with their wellness or overcoming obstacles.  3. Patient will identify feelings, thought process and behaviors related to these barriers. 4. Patient will identify two changes they are willing to make to overcome these obstacles:    Summary of Patient Progress   Patient was engaged throughout group therapy. Patient did not talk much but patient was able to identify a current obstacle which is him throwing himself on the ground. Patient stated that he does so because he is bored which is the barrier to his obstacle. Patient was able to identify a change that he is willing to make to overcome his obstacle which is finding things that he can do when he does get bored.    Therapeutic Modalities:   Cognitive Behavioral Therapy Solution Focused Therapy Motivational Interviewing Relapse Prevention Therapy   Ardelle Anton, LCSW

## 2019-06-23 NOTE — Progress Notes (Addendum)
Was the fall witnessed: no  Patient condition before and after the fall: Patient up and preparing to take a shower, he was in his room naked at bathroom door when writer and mhts came to his room after hearing a loud noise.   Patient's reaction to the fall: Patient appeared sleepy but was up moving about in his room, he reported that he did not hit his head after he fell.  Name of the doctor that was notified including date and time: Lindon Romp NP notified at [phone]  Any interventions and vital signs: Vitals taken at 0548 sitting- 142/83, standing 125/88, temp. 97.5

## 2019-06-23 NOTE — Progress Notes (Addendum)
Patient placed on 1:1 due to attempting bang his head on the floor.  Patient has been redirected but unable to maintain safety and continues with dangerous self injurious behaviors.  Patient monitored 1:1 with safety sitter nearby.  Continue to monitor as planned.

## 2019-06-24 LAB — ANTISTREPTOLYSIN O TITER: ASO: 273 IU/mL — ABNORMAL HIGH (ref 0.0–200.0)

## 2019-06-24 MED ORDER — HALOPERIDOL 5 MG PO TABS
10.0000 mg | ORAL_TABLET | Freq: Three times a day (TID) | ORAL | Status: DC
  Administered 2019-06-24 – 2019-06-27 (×8): 10 mg via ORAL
  Filled 2019-06-24 (×13): qty 2

## 2019-06-24 MED ORDER — PREDNISONE 10 MG PO TABS
10.0000 mg | ORAL_TABLET | Freq: Two times a day (BID) | ORAL | Status: DC
  Administered 2019-06-24 – 2019-06-27 (×6): 10 mg via ORAL
  Filled 2019-06-24 (×7): qty 1

## 2019-06-24 MED ORDER — CLONAZEPAM 1 MG PO TABS
3.0000 mg | ORAL_TABLET | Freq: Once | ORAL | Status: AC
  Administered 2019-06-24: 3 mg via ORAL
  Filled 2019-06-24: qty 3

## 2019-06-24 MED ORDER — MIRTAZAPINE 30 MG PO TABS
30.0000 mg | ORAL_TABLET | Freq: Every day | ORAL | Status: DC
  Administered 2019-06-26: 30 mg via ORAL
  Filled 2019-06-24 (×4): qty 1
  Filled 2019-06-24: qty 2
  Filled 2019-06-24: qty 1

## 2019-06-24 NOTE — Progress Notes (Signed)
Nursing 1:1 note D:Pt observed sleeping in bed with eyes closed. RR even and unlabored. No distress noted. A: 1:1 observation continues for safety  R: pt remains safe  

## 2019-06-24 NOTE — BHH Counselor (Signed)
CSW faxed over a referral and additional information to [facility] for the patient to begin ACTT services. Fax number: [phone].      Ardelle Anton, MSW, [facility] Phone: [phone] Fax: [phone]

## 2019-06-24 NOTE — Progress Notes (Signed)
   06/24/19 2000  Psych Admission Type (Psych Patients Only)  Admission Status Voluntary  Psychosocial Assessment  Patient Complaints None  Eye Contact Avoids  Facial Expression Blank;Flat  Affect Blunted;Depressed  Speech Logical/coherent  Interaction No initiation  Motor Activity Other (Comment) (wnl)  Appearance/Hygiene In hospital gown  Behavior Characteristics Cooperative  Mood Preoccupied  Aggressive Behavior  Effect No apparent injury  Thought Process  Coherency WDL  Content WDL  Delusions None reported or observed  Perception WDL  Hallucination Auditory  Judgment Impaired  Confusion None  Danger to Self  Current suicidal ideation? Passive  Self-Injurious Behavior No self-injurious ideation or behavior indicators observed or expressed   Agreement Not to Harm Self Yes  Description of Agreement verbally contracts for safety  Danger to Others  Danger to Others None reported or observed  Danger to Others Abnormal  Harmful Behavior to others No threats or harm toward other people  Destructive Behavior No threats or harm toward property

## 2019-06-24 NOTE — Progress Notes (Signed)
   06/24/19 0100  Psych Admission Type (Psych Patients Only)  Admission Status Voluntary  Psychosocial Assessment  Patient Complaints None  Eye Contact Avoids  Facial Expression Blank;Flat  Affect Blunted;Depressed  Speech Logical/coherent  Interaction No initiation  Motor Activity Other (Comment) (wnl)  Appearance/Hygiene In hospital gown  Behavior Characteristics Cooperative  Mood Anxious;Preoccupied  Aggressive Behavior  Effect No apparent injury  Thought Process  Coherency WDL  Content WDL  Delusions None reported or observed  Perception WDL  Hallucination Auditory  Judgment Impaired  Confusion None  Danger to Self  Current suicidal ideation? Passive  Self-Injurious Behavior No self-injurious ideation or behavior indicators observed or expressed   Agreement Not to Harm Self Yes  Description of Agreement verbally contracts for safety  Danger to Others  Danger to Others None reported or observed  Danger to Others Abnormal  Harmful Behavior to others No threats or harm toward other people  Destructive Behavior No threats or harm toward property   Pt continues to express SI , but does not appear to want to hurt himself even though pt was placed on a 1:1 for throwing himself onto the floor trying to hurt himself.

## 2019-06-24 NOTE — Progress Notes (Signed)
1:1 Note: Patient maintained on constant supervision for safety.  Patient remained in his room for most of this shift.  Medication given as prescribed.  Denies suicidal thoughts, auditory and visual hallucinations.  Routine safety checks maintained every 15 minutes.  Patient is safe on the unit with supervision.

## 2019-06-24 NOTE — Progress Notes (Signed)
Recreation Therapy Notes  Date: 11.17.20 Time: 0950 Location: [address] Dayroom  Group Topic: Self-Esteem  Goal Area(s) Addresses:  Patient will successfully identify positive attributes about themselves.  Patient will successfully identify benefit of improved self-esteem.   Intervention: Construction paper, scissors, glue sticks, magazines  Activity: Collage About Me.  Patients were to create a collage that highlights their positive qualities or describes them.  Patients could use words or just pictures that tell about them.  Education:  Self-Esteem, Discharge Planning.   Education Outcome: Acknowledges education/In group clarification offered/Needs additional education  Clinical Observations/Feedback: Pt did not attend group.    Malikah Principato, LRT/CTRS         Ardath Lepak A 06/24/2019 11:35 AM 

## 2019-06-24 NOTE — Progress Notes (Signed)
1:1 Note: Patient maintained on constant supervision for safety.  Medication given as prescribed.  Patient still preoccupied and disorganized.  Need a lot of redirection and cueing with hygiene/daily activities.  Routine safety checks maintained every 15 minutes.  Patient is safe on the unit with supervision.

## 2019-06-24 NOTE — Progress Notes (Signed)
[facility]  06/24/2019 7:52 AM Marrianne Moodsaiah C Irigoyen  MRN:  [id number] Subjective:   Patient has had a difficult night he is generally mute for the majority of my exam, he has been throwing himself on the floor and then standing up at times and just facing the wall and becomes mute.  He cannot explain to me why he is throwing himself to the ground.  Staff informed that he would go to the padded room if this continues.  He seemed to respond better to that type of direct confrontation however he he remains on 1-1 precautions Principal Problem: Schizoaffective disorder, depressive type (HCC) Diagnosis: Principal Problem:   Schizoaffective disorder, depressive type (HCC) Active Problems:   Schizoaffective disorder, bipolar type (HCC)  Total Time spent with patient: 20 minutes  Past Psychiatric History: Extensive  Past Medical History:  Past Medical History:  Diagnosis Date  . ADD (attention deficit disorder)   . Anxiety   . Depression   . Environmental allergies   . Schizo-affective psychosis (HCC)    History reviewed. No pertinent surgical history. Family History:  Family History  Problem Relation Age of Onset  . Hypertension Mother   . Hypertension Father   . Mental illness Neg Hx    Family Psychiatric  History: Unaware of new data Social History:  Social History   Substance and Sexual Activity  Alcohol Use Yes  . Alcohol/week: 1.0 standard drinks  . Types: 1 Standard drinks or equivalent per week   Comment: "once in a blue moon"     Social History   Substance and Sexual Activity  Drug Use Yes  . Types: Other-see comments, Marijuana   Comment: oil smoked in pipe calls "DAB", friends buy for him    Social History   Socioeconomic History  . Marital status: Single    Spouse name: Not on file  . Number of children: Not on file  . Years of education: Not on file  . Highest education level: Not on file  Occupational History  . Not on file  Social Needs  .  Financial resource strain: Patient refused  . Food insecurity    Worry: Patient refused    Inability: Patient refused  . Transportation needs    Medical: Patient refused    Non-medical: Patient refused  Tobacco Use  . Smoking status: Current Every Day Smoker    Packs/day: 2.00    Start date: 02/05/2019  . Smokeless tobacco: Never Used  Substance and Sexual Activity  . Alcohol use: Yes    Alcohol/week: 1.0 standard drinks    Types: 1 Standard drinks or equivalent per week    Comment: "once in a blue moon"  . Drug use: Yes    Types: Other-see comments, Marijuana    Comment: oil smoked in pipe calls "DAB", friends buy for him  . Sexual activity: Yes    Birth control/protection: Condom  Lifestyle  . Physical activity    Days per week: Patient refused    Minutes per session: Patient refused  . Stress: Not on file  Relationships  . Social Musicianconnections    Talks on phone: Patient refused    Gets together: Patient refused    Attends religious service: Patient refused    Active member of club or organization: Patient refused    Attends meetings of clubs or organizations: Patient refused    Relationship status: Patient refused  Other Topics Concern  . Not on file  Social History Narrative   Graduated  from high school at age 81. He took IEP classes until he was in 10th grade. In 11th and 12th grade, he took normal classes and says he didn't need any extra help. He did 2 months of college at [employer], and this was difficult for him. He dropped out of college, due to difficulty balancing work and school.    Additional Social History:    Pain Medications: Denies abuse Prescriptions: Denies abuse Over the Counter: Denies abuse History of alcohol / drug use?: Yes(History of abusing marijuana) Longest period of sobriety (when/how long): unknown                    Sleep: Poor  Appetite:  Poor  Current Medications: Current Facility-Administered Medications  Medication Dose Route  Frequency Provider Last Rate Last Dose  . acetaminophen (TYLENOL) tablet 650 mg  650 mg Oral Q6H PRN Nira Conn A, NP      . alum & mag hydroxide-simeth (MAALOX/MYLANTA) 200-200-20 MG/5ML suspension 30 mL  30 mL Oral Q4H PRN Jackelyn Poling, NP      . calamine lotion   Topical BID Maryagnes Amos, FNP      . clonazePAM (KLONOPIN) tablet 3 mg  3 mg Oral Once Malvin Johns, MD      . haloperidol (HALDOL) tablet 10 mg  10 mg Oral TID Malvin Johns, MD      . hydrocerin (EUCERIN) cream   Topical BID Oneta Rack, NP      . hydrocortisone cream 1 %   Topical BID Oneta Rack, NP      . hydrOXYzine (ATARAX/VISTARIL) tablet 25 mg  25 mg Oral TID PRN Jackelyn Poling, NP   25 mg at 06/23/19 2124  . loratadine (CLARITIN) tablet 10 mg  10 mg Oral Daily Maryagnes Amos, FNP   10 mg at 06/23/19 9924  . magnesium hydroxide (MILK OF MAGNESIA) suspension 30 mL  30 mL Oral Daily PRN Nira Conn A, NP      . mirtazapine (REMERON) tablet 30 mg  30 mg Oral QHS Malvin Johns, MD      . predniSONE (DELTASONE) tablet 10 mg  10 mg Oral BID WC Malvin Johns, MD      . triamcinolone ointment (KENALOG) 0.1 %   Topical BID Maryagnes Amos, FNP        Lab Results:  Results for orders placed or performed during the hospital encounter of 06/22/19 (from the past 48 hour(s))  CBC     Status: None   Collection Time: 06/22/19  6:13 PM  Result Value Ref Range   WBC 5.1 4.0 - 10.5 K/uL   RBC 5.02 4.22 - 5.81 MIL/uL   Hemoglobin 14.5 13.0 - 17.0 g/dL   HCT 26.8 34.1 - 96.2 %   MCV 87.8 80.0 - 100.0 fL   MCH 28.9 26.0 - 34.0 pg   MCHC 32.9 30.0 - 36.0 g/dL   RDW 22.9 79.8 - 92.1 %   Platelets 292 150 - 400 K/uL   nRBC 0.0 0.0 - 0.2 %    Comment: Performed at [facility], [address], [place], [place] [address]  Comprehensive metabolic panel     Status: None   Collection Time: 06/22/19  6:13 PM  Result Value Ref Range   Sodium 141 135 - 145 mmol/L   Potassium 3.5 3.5 - 5.1  mmol/L   Chloride 104 98 - 111 mmol/L   CO2 26 22 - 32  mmol/L   Glucose, Bld 99 70 - 99 mg/dL   BUN 14 6 - 20 mg/dL   Creatinine, Ser 1.18 0.61 - 1.24 mg/dL   Calcium 10.2 8.9 - 10.3 mg/dL   Total Protein 8.1 6.5 - 8.1 g/dL   Albumin 5.0 3.5 - 5.0 g/dL   AST 29 15 - 41 U/L   ALT 39 0 - 44 U/L   Alkaline Phosphatase 76 38 - 126 U/L   Total Bilirubin 0.8 0.3 - 1.2 mg/dL   GFR calc non Af Amer >60 >60 mL/min   GFR calc Af Amer >60 >60 mL/min   Anion gap 11 5 - 15    Comment: Performed at [facility], [place] [address] [place], [place] [address]  Lipid panel     Status: Abnormal   Collection Time: 06/22/19  6:13 PM  Result Value Ref Range   Cholesterol 190 0 - 200 mg/dL   Triglycerides 60 <150 mg/dL   HDL 41 >40 mg/dL   Total CHOL/HDL Ratio 4.6 RATIO   VLDL 12 0 - 40 mg/dL   LDL Cholesterol 137 (H) 0 - 99 mg/dL    Comment:        Total Cholesterol/HDL:CHD Risk Coronary Heart Disease Risk Table                     Men   Women  1/2 Average Risk   3.4   3.3  Average Risk       5.0   4.4  2 X Average Risk   9.6   7.1  3 X Average Risk  23.4   11.0        Use the calculated Patient Ratio above and the CHD Risk Table to determine the patient's CHD Risk.        ATP III CLASSIFICATION (LDL):  <100     mg/dL   Optimal  100-129  mg/dL   Near or Above                    Optimal  130-159  mg/dL   Borderline  160-189  mg/dL   High  >190     mg/dL   Very High Performed at [place] [address] [place], [place] [address]   TSH     Status: None   Collection Time: 06/22/19  6:13 PM  Result Value Ref Range   TSH 1.012 0.350 - 4.500 uIU/mL    Comment: Performed by a 3rd Generation assay with a functional sensitivity of <=0.01 uIU/mL. Performed at [facility], [place] [address], [place],  [address]   RPR     Status: None   Collection Time: 06/22/19  6:13 PM  Result Value Ref Range   RPR Ser Ql NON REACTIVE NON  REACTIVE    Comment: Performed at [facility], [place] [address] [place], [place] [address]  Rapid HIV screen (HIV 1/2 Ab+Ag)     Status: None   Collection Time: 06/22/19  6:13 PM  Result Value Ref Range   HIV-1 P24 Antigen - HIV24 NON REACTIVE NON REACTIVE    Comment: (NOTE) Detection of p24 may be inhibited by biotin in the sample, causing false negative results in acute infection.    HIV 1/2 Antibodies NON REACTIVE NON REACTIVE   Interpretation (HIV Ag Ab)      A non reactive test result means that HIV 1 or HIV 2 antibodies and  HIV 1 p24 antigen were not detected in the specimen.    Comment: RESULT CALLED TO, READ BACK BY AND VERIFIED WITH: LIZ B.,RN [id number]  BY V.WILKINS Performed at [facility], [address] [place], [place] [address]     Blood Alcohol level:  Lab Results  Component Value Date   [facility] <10 10/12/2018   ETH <10 08/17/2017    Metabolic Disorder Labs: Lab Results  Component Value Date   HGBA1C 5.5 05/16/2019   MPG 111.15 05/16/2019   MPG 122.63 10/15/2018   Lab Results  Component Value Date   PROLACTIN 2.2 (L) 05/16/2019   PROLACTIN 3.4 (L) 10/15/2018   Lab Results  Component Value Date   CHOL 190 06/22/2019   TRIG 60 06/22/2019   HDL 41 06/22/2019   CHOLHDL 4.6 06/22/2019   VLDL 12 06/22/2019   LDLCALC 137 (H) 06/22/2019   LDLCALC 98 05/16/2019    Physical Findings: AIMS: Facial and Oral Movements Muscles of Facial Expression: None, normal Lips and Perioral Area: None, normal Jaw: None, normal Tongue: None, normal,Extremity Movements Upper (arms, wrists, hands, fingers): None, normal Lower (legs, knees, ankles, toes): None, normal, Trunk Movements Neck, shoulders, hips: None, normal, Overall Severity Severity of abnormal movements (highest score from questions above): None, normal Incapacitation due to abnormal movements: None, normal Patient's awareness of abnormal movements (rate only patient's report): No  Awareness, Dental Status Current problems with teeth and/or dentures?: No Does patient usually wear dentures?: No  CIWA:    COWS:     Musculoskeletal: Strength & Muscle Tone: within normal limits Gait & Station: normal Patient leans: N/A  Psychiatric Specialty Exam: Physical Exam  ROS  Blood pressure 115/73, pulse (!) 102, temperature 98 F (36.7 C), temperature source Oral, resp. rate 16, height 6' (1.829 m), weight 111.1 kg, SpO2 100 %.Body mass index is 33.23 kg/m.  General Appearance: Casual  Eye Contact:  None  Speech:  Mute at times  Volume:  Decreased  Mood:  Dysphoric  Affect:  Blunt  Thought Process:  Disorganized  Orientation:  Other:  Will not answer  Thought Content:  Illogical  Suicidal Thoughts:  No  Homicidal Thoughts:  No  Memory:  Immediate;   Poor Recent;   Poor Remote;   Poor  Judgement:  Impaired  Insight:  Lacking  Psychomotor Activity:  Normal  Concentration:  Concentration: Fair and Attention Span: Fair  Recall:  [employer] of Knowledge:  Fair  Language:  Fair  Akathisia:  Negative  Handed:  Right  AIMS (if indicated):     Assets:  Physical Health Resilience  ADL's:  Intact  Cognition:  WNL  Sleep:  Number of Hours: 5.75     Treatment Plan Summary: Daily contact with patient to assess and evaluate symptoms and progress in treatment and Medication management  For acute dangerousness add Klonopin one-time dose to calm him and perhaps sedate him for period of time, for ongoing psychosis escalate Haldol to 10 mg 3 times daily continue one-to-one precautions for safety continue reality based therapy  Devery Odwyer, MD 06/24/2019, 7:52 AM
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
## 2019-06-24 NOTE — Progress Notes (Signed)
Adult Psychoeducational Group Note  Date:  06/24/2019 Time:  1:37 AM  Group Topic/Focus:  Wrap-Up Group:   The focus of this group is to help patients review their daily goal of treatment and discuss progress on daily workbooks.  Participation Level:  Did Not Attend  Participation Quality:  Did not attend  Affect:  Did not attend  Cognitive:  Did not attend  Insight: None  Engagement in Group:  Did not attend  Modes of Intervention:  Did not attend  Additional Comments:  Patient did not attend wrap up group tonight.  Marvin Crawford [name] 06/24/2019, 1:37 AM

## 2019-06-24 NOTE — Progress Notes (Signed)
Nursing 1:1 note D:Pt was observed by the MHT trying to throw himself on the floor. Pt mattress was placed on the floor and pt was informed to stop that behavior. Per MHT pt did not fall on the floor pt lowered himself on his backside to the floor. Pt appears  to not have the capacity to program appropriately at [facility] A: 1:1 observation continues for safety  R: pt remains safe

## 2019-06-24 NOTE — Progress Notes (Signed)
1:1 Note: Patient maintained on constant supervision for safety. Patient presents with a flat affect and depressed mood.  Patient is in bed sleeping.  Routine safety checks maintained every 15 minutes.  Support and encouragement offered as needed.  Patient is safe on the unit with supervision.

## 2019-06-25 MED ORDER — CLONAZEPAM 0.5 MG PO TABS
0.5000 mg | ORAL_TABLET | Freq: Two times a day (BID) | ORAL | Status: DC
  Administered 2019-06-26 – 2019-06-27 (×3): 0.5 mg via ORAL
  Filled 2019-06-25 (×3): qty 1

## 2019-06-25 NOTE — Progress Notes (Signed)
[facility] MD Progress Note  06/25/2019 8:11 AM Marvin Crawford  MRN:  [id number] Subjective:   Last night was uneventful patient responded to redirection he currently denies auditory or visual hallucinations but is not very reassuring when spoken to.  He denies thoughts of harming self or others- Generally improved behaviorally. No EPS or TD Principal Problem: Schizoaffective disorder, depressive type (HCC) Diagnosis: Principal Problem:   Schizoaffective disorder, depressive type ([place]) Active Problems:   Schizoaffective disorder, bipolar type ([place])  Total Time spent with patient: 20 minutes  Past Psychiatric History: Extensive  Past Medical History:  Past Medical History:  Diagnosis Date  . ADD (attention deficit disorder)   . Anxiety   . Depression   . Environmental allergies   . Schizo-affective psychosis ([place])    History reviewed. No pertinent surgical history. Family History:  Family History  Problem Relation Age of Onset  . Hypertension Mother   . Hypertension Father   . Mental illness Neg Hx    Family Psychiatric  History: See eval Social History:  Social History   Substance and Sexual Activity  Alcohol Use Yes  . Alcohol/week: 1.0 standard drinks  . Types: 1 Standard drinks or equivalent per week   Comment: "once in Crawford blue moon"     Social History   Substance and Sexual Activity  Drug Use Yes  . Types: Other-see comments, Marijuana   Comment: oil smoked in pipe calls "DAB", friends buy for him    Social History   Socioeconomic History  . Marital status: Single    Spouse name: Not on file  . Number of children: Not on file  . Years of education: Not on file  . Highest education level: Not on file  Occupational History  . Not on file  Social Needs  . Financial resource strain: Patient refused  . Food insecurity    Worry: Patient refused    Inability: Patient refused  . Transportation needs    Medical: Patient refused    Non-medical: Patient refused   Tobacco Use  . Smoking status: Current Every Day Smoker    Packs/day: 2.00    Start date: 02/05/2019  . Smokeless tobacco: Never Used  Substance and Sexual Activity  . Alcohol use: Yes    Alcohol/week: 1.0 standard drinks    Types: 1 Standard drinks or equivalent per week    Comment: "once in Crawford blue moon"  . Drug use: Yes    Types: Other-see comments, Marijuana    Comment: oil smoked in pipe calls "DAB", friends buy for him  . Sexual activity: Yes    Birth control/protection: Condom  Lifestyle  . Physical activity    Days per week: Patient refused    Minutes per session: Patient refused  . Stress: Not on file  Relationships  . Social Herbalist on phone: Patient refused    Gets together: Patient refused    Attends religious service: Patient refused    Active member of club or organization: Patient refused    Attends meetings of clubs or organizations: Patient refused    Relationship status: Patient refused  Other Topics Concern  . Not on file  Social History Narrative   Graduated from high school at age 76. He took IEP classes until he was in 10th grade. In 11th and 12th grade, he took normal classes and says he didn't need any extra help. He did 2 months of college at [employer], and this was difficult for him. He  dropped out of college, due to difficulty balancing work and school.    Additional Social History:    Pain Medications: Denies abuse Prescriptions: Denies abuse Over the Counter: Denies abuse History of alcohol / drug use?: Yes(History of abusing marijuana) Longest period of sobriety (when/how long): unknown                    Sleep: Good  Appetite:  Good  Current Medications: Current Facility-Administered Medications  Medication Dose Route Frequency Provider Last Rate Last Dose  . acetaminophen (TYLENOL) tablet 650 mg  650 mg Oral Q6H PRN Marvin Crawford, Marvin A, NP      . alum & mag hydroxide-simeth (MAALOX/MYLANTA) 200-200-20 MG/5ML suspension 30 mL   30 mL Oral Q4H PRN Jackelyn PolingBerry, Marvin A, NP      . calamine lotion   Topical BID Marvin Crawford, Marvin S, FNP      . haloperidol (HALDOL) tablet 10 mg  10 mg Oral TID Marvin Crawford, Marvin Purtle, MD   10 mg at 06/25/19 0806  . hydrocerin (EUCERIN) cream   Topical BID Oneta RackLewis, Marvin N, NP      . hydrocortisone cream 1 %   Topical BID Oneta RackLewis, Marvin N, NP      . hydrOXYzine (ATARAX/VISTARIL) tablet 25 mg  25 mg Oral TID PRN Marvin Crawford, Marvin A, NP   25 mg at 06/25/19 0250  . loratadine (CLARITIN) tablet 10 mg  10 mg Oral Daily Marvin Crawford, Marvin S, FNP   10 mg at 06/25/19 0806  . magnesium hydroxide (MILK OF MAGNESIA) suspension 30 mL  30 mL Oral Daily PRN Marvin Crawford, Marvin A, NP      . mirtazapine (REMERON) tablet 30 mg  30 mg Oral QHS Marvin Crawford, Marvin Diamant, MD      . predniSONE (DELTASONE) tablet 10 mg  10 mg Oral BID WC Marvin Crawford, Saralyn Willison, MD   10 mg at 06/25/19 0806  . triamcinolone ointment (KENALOG) 0.1 %   Topical BID Marvin Crawford, Marvin S, FNP        Lab Results: No results found for this or any previous visit (from the past 48 hour(Crawford)).  Blood Alcohol level:  Lab Results  Component Value Date   ETH <10 10/12/2018   ETH <10 08/17/2017    Metabolic Disorder Labs: Lab Results  Component Value Date   HGBA1C 5.5 05/16/2019   MPG 111.15 05/16/2019   MPG 122.63 10/15/2018   Lab Results  Component Value Date   PROLACTIN 2.2 (L) 05/16/2019   PROLACTIN 3.4 (L) 10/15/2018   Lab Results  Component Value Date   CHOL 190 06/22/2019   TRIG 60 06/22/2019   HDL 41 06/22/2019   CHOLHDL 4.6 06/22/2019   VLDL 12 06/22/2019   LDLCALC 137 (H) 06/22/2019   LDLCALC 98 05/16/2019    Physical Findings: AIMS: Facial and Oral Movements Muscles of Facial Expression: None, normal Lips and Perioral Area: None, normal Jaw: None, normal Tongue: None, normal,Extremity Movements Upper (arms, wrists, hands, fingers): None, normal Lower (legs, knees, ankles, toes): None, normal, Trunk Movements Neck, shoulders, hips: None, normal, Overall  Severity Severity of abnormal movements (highest score from questions above): None, normal Incapacitation due to abnormal movements: None, normal Patient'Crawford awareness of abnormal movements (rate only patient'Crawford report): No Awareness, Dental Status Current problems with teeth and/or dentures?: No Does patient usually wear dentures?: No  CIWA:    COWS:     Musculoskeletal: Strength & Muscle Tone: within normal limits Gait & Station: normal Patient leans: Crawford/Crawford  Psychiatric Specialty Exam:  Physical Exam  ROS  Blood pressure 111/79, pulse 88, temperature 97.7 F (36.5 C), temperature source Oral, resp. rate 18, height 6' (1.829 m), weight 111.1 kg, SpO2 100 %.Body mass index is 33.23 kg/m.  General Appearance: Casual and Disheveled  Eye Contact:  Minimal  Speech:  Slow  Volume:  Decreased  Mood:  Dysphoric  Affect:  Blunt  Thought Process:  Linear and Descriptions of Associations: Circumstantial  Orientation:  Full (Time, Place, and Person)  Thought Content:  Denies auditory or visual hallucinations  Suicidal Thoughts:  No  Homicidal Thoughts:  No  Memory:  Immediate;   Fair Recent;   Fair Remote;   Fair  Judgement:  Fair  Insight:  Fair  Psychomotor Activity:  Normal  Concentration:  Concentration: Fair and Attention Span: Fair  Recall:  [employer] of Knowledge:  Fair  Language:  Fair  Akathisia:  Negative  Handed:  Right  AIMS (if indicated):     Assets:  Leisure Time Physical Health Resilience  ADL'Crawford:  Intact  Cognition:  WNL  Sleep:  Number of Hours: 5.75     Treatment Plan Summary: Daily contact with patient to assess and evaluate symptoms and progress in treatment and Medication management  Continue current cognitive and behavioral therapies continue current meds seem to do better with the clonazepam will add in low-dose to help with behavioral issues probable discharge 24 to 48 hours  Mikenzi Raysor, MD 06/25/2019, 8:11 AM

## 2019-06-25 NOTE — Progress Notes (Signed)
Recreation Therapy Notes  Date: 11.18.20 Time: 1000 Location: 500 [address] Dayroom  Group Topic: Communication, Team Building, Problem Solving  Goal Area(s) Addresses:  Patient will effectively work with peer towards shared goal.  Patient will identify skills used to make activity successful.  Patient will identify how skills used during activity can be used to reach post d/c goals.   Intervention: STEM Activity  Activity: Straw Bridge.  In groups, patients were given 15 plastic straws and 68ft of masking tape.  Patients were to build an elevated bridge that can stand on it's own and hold a paperback book.  Education: Education officer, community, Discharge Planning   Education Outcome: Acknowledges education/In group clarification offered/Needs additional education.   Clinical Observations/Feedback: Pt did not attend group.    [name], LRT/CTRS         [name] A 06/25/2019 12:09 PM

## 2019-06-25 NOTE — Progress Notes (Signed)
Nursing 1:1 note D:Pt observed sleeping in bed with eyes closed. RR even and unlabored. No distress noted. A: 1:1 observation continues for safety  R: pt remains safe  

## 2019-06-25 NOTE — Progress Notes (Signed)
Patient has had no unsafe been engaged in safe behaviors with 1:1 staff providing redirection. Patient still reports thoughts of self injury and.  Continue to monitor per 1:1 policy. Continue to monitor as planned.

## 2019-06-25 NOTE — Progress Notes (Signed)
Nursing 1:1 note D:Pt observed in the hallway. RR even and unlabored. No distress noted. A: 1:1 observation continues for safety  R: pt remains safe

## 2019-06-26 DIAGNOSIS — F250 Schizoaffective disorder, bipolar type: Principal | ICD-10-CM

## 2019-06-26 MED ORDER — HALOPERIDOL DECANOATE 100 MG/ML IM SOLN
150.0000 mg | INTRAMUSCULAR | Status: DC
  Administered 2019-06-26: 150 mg via INTRAMUSCULAR
  Filled 2019-06-26: qty 1.5

## 2019-06-26 NOTE — Progress Notes (Signed)
Nursing 1:1 note D:Pt observed sitting in the dayroom . RR even and unlabored. No distress noted. A: 1:1 observation continues for safety  R: pt remains safe  

## 2019-06-26 NOTE — Progress Notes (Signed)
   06/26/19 2000  Psych Admission Type (Psych Patients Only)  Admission Status Voluntary  Psychosocial Assessment  Patient Complaints None  Eye Contact Avoids  Facial Expression Blank;Flat  Affect Blunted;Depressed  Speech Logical/coherent  Interaction No initiation  Motor Activity Other (Comment) (wnl)  Appearance/Hygiene In hospital gown  Behavior Characteristics Cooperative  Mood Preoccupied  Aggressive Behavior  Effect No apparent injury  Thought Process  Coherency WDL  Content WDL  Delusions None reported or observed  Perception WDL  Hallucination Auditory  Judgment Impaired  Confusion None  Danger to Self  Current suicidal ideation? Passive  Self-Injurious Behavior No self-injurious ideation or behavior indicators observed or expressed   Agreement Not to Harm Self Yes  Description of Agreement verbally contracts for safety  Danger to Others  Danger to Others None reported or observed  Danger to Others Abnormal  Harmful Behavior to others No threats or harm toward other people  Destructive Behavior No threats or harm toward property

## 2019-06-26 NOTE — Progress Notes (Signed)
   06/26/19 0100  Psych Admission Type (Psych Patients Only)  Admission Status Voluntary  Psychosocial Assessment  Patient Complaints None  Eye Contact Avoids  Facial Expression Blank;Flat  Affect Blunted;Depressed  Speech Logical/coherent  Interaction No initiation  Motor Activity Other (Comment) (wnl)  Appearance/Hygiene In hospital gown  Behavior Characteristics Cooperative  Mood Preoccupied  Aggressive Behavior  Effect No apparent injury  Thought Process  Coherency WDL  Content WDL  Delusions None reported or observed  Perception WDL  Hallucination Auditory  Judgment Impaired  Confusion None  Danger to Self  Current suicidal ideation? Passive  Self-Injurious Behavior No self-injurious ideation or behavior indicators observed or expressed   Agreement Not to Harm Self Yes  Description of Agreement verbally contracts for safety  Danger to Others  Danger to Others None reported or observed  Danger to Others Abnormal  Harmful Behavior to others No threats or harm toward other people  Destructive Behavior No threats or harm toward property   Pt visible in the dayroom with minimal interaction. Pt continues on 1:1 for self injurious behaviors.

## 2019-06-26 NOTE — Progress Notes (Signed)
Nursing 1:1 note D:Pt observed sitting in bed RR even and unlabored. No distress noted.  A: 1:1 observation continues for safety  R: pt remains safe  

## 2019-06-26 NOTE — Progress Notes (Signed)
[facility] MD Progress Note  06/26/2019 12:58 PM Marvin Crawford  MRN:  [id number] Subjective:   Marvin Crawford is showing improvement in his behavior he is not diving onto the floor so forth he remains on one-to-one precautions due to the drama and the dangerousness of his previous behaviors but he is reporting no current auditory or visual hallucinations.  His mother visited last night and her impression is that though he is better and certainly calm her on the current regimen he made some straight and disjointed comments and she fears he is not baseline yet.  No EPS or TD  Principal Problem: Schizoaffective disorder, depressive type ([place]) Diagnosis: Principal Problem:   Schizoaffective disorder, depressive type ([place]) Active Problems:   Schizoaffective disorder, bipolar type ([place])  Total Time spent with patient: 20 minutes  Past Psychiatric History: exstensive  Past Medical History:  Past Medical History:  Diagnosis Date  . ADD (attention deficit disorder)   . Anxiety   . Depression   . Environmental allergies   . Schizo-affective psychosis ([place])    History reviewed. No pertinent surgical history. Family History:  Family History  Problem Relation Age of Onset  . Hypertension Mother   . Hypertension Father   . Mental illness Neg Hx    Family Psychiatric  History: see eval Social History:  Social History   Substance and Sexual Activity  Alcohol Use Yes  . Alcohol/week: 1.0 standard drinks  . Types: 1 Standard drinks or equivalent per week   Comment: "once in a blue moon"     Social History   Substance and Sexual Activity  Drug Use Yes  . Types: Other-see comments, Marijuana   Comment: oil smoked in pipe calls "DAB", friends buy for him    Social History   Socioeconomic History  . Marital status: Single    Spouse name: Not on file  . Number of children: Not on file  . Years of education: Not on file  . Highest education level: Not on file  Occupational History  . Not on  file  Social Needs  . Financial resource strain: Patient refused  . Food insecurity    Worry: Patient refused    Inability: Patient refused  . Transportation needs    Medical: Patient refused    Non-medical: Patient refused  Tobacco Use  . Smoking status: Current Every Day Smoker    Packs/day: 2.00    Start date: 02/05/2019  . Smokeless tobacco: Never Used  Substance and Sexual Activity  . Alcohol use: Yes    Alcohol/week: 1.0 standard drinks    Types: 1 Standard drinks or equivalent per week    Comment: "once in a blue moon"  . Drug use: Yes    Types: Other-see comments, Marijuana    Comment: oil smoked in pipe calls "DAB", friends buy for him  . Sexual activity: Yes    Birth control/protection: Condom  Lifestyle  . Physical activity    Days per week: Patient refused    Minutes per session: Patient refused  . Stress: Not on file  Relationships  . Social Herbalist on phone: Patient refused    Gets together: Patient refused    Attends religious service: Patient refused    Active member of club or organization: Patient refused    Attends meetings of clubs or organizations: Patient refused    Relationship status: Patient refused  Other Topics Concern  . Not on file  Social History Narrative   Graduated from  high school at age 89. He took IEP classes until he was in 10th grade. In 11th and 12th grade, he took normal classes and says he didn't need any extra help. He did 2 months of college at [employer], and this was difficult for him. He dropped out of college, due to difficulty balancing work and school.    Additional Social History:    Pain Medications: Denies abuse Prescriptions: Denies abuse Over the Counter: Denies abuse History of alcohol / drug use?: Yes(History of abusing marijuana) Longest period of sobriety (when/how long): unknown                    Sleep: Good  Appetite:  Good  Current Medications: Current Facility-Administered Medications   Medication Dose Route Frequency Provider Last Rate Last Dose  . acetaminophen (TYLENOL) tablet 650 mg  650 mg Oral Q6H PRN Nira Conn A, NP      . alum & mag hydroxide-simeth (MAALOX/MYLANTA) 200-200-20 MG/5ML suspension 30 mL  30 mL Oral Q4H PRN Jackelyn Poling, NP      . calamine lotion   Topical BID Maryagnes Amos, FNP      . clonazePAM (KLONOPIN) tablet 0.5 mg  0.5 mg Oral BID Malvin Johns, MD   0.5 mg at 06/26/19 0200  . haloperidol (HALDOL) tablet 10 mg  10 mg Oral TID Malvin Johns, MD   10 mg at 06/26/19 0843  . hydrocerin (EUCERIN) cream   Topical BID Oneta Rack, NP      . hydrocortisone cream 1 %   Topical BID Oneta Rack, NP      . hydrOXYzine (ATARAX/VISTARIL) tablet 25 mg  25 mg Oral TID PRN Jackelyn Poling, NP   25 mg at 06/26/19 0138  . loratadine (CLARITIN) tablet 10 mg  10 mg Oral Daily Maryagnes Amos, FNP   10 mg at 06/26/19 0843  . magnesium hydroxide (MILK OF MAGNESIA) suspension 30 mL  30 mL Oral Daily PRN Nira Conn A, NP      . mirtazapine (REMERON) tablet 30 mg  30 mg Oral QHS Malvin Johns, MD   30 mg at 06/26/19 0138  . predniSONE (DELTASONE) tablet 10 mg  10 mg Oral BID WC Malvin Johns, MD   10 mg at 06/26/19 0843  . triamcinolone ointment (KENALOG) 0.1 %   Topical BID Maryagnes Amos, FNP        Lab Results: No results found for this or any previous visit (from the past 48 hour(s)).  Blood Alcohol level:  Lab Results  Component Value Date   ETH <10 10/12/2018   ETH <10 08/17/2017    Metabolic Disorder Labs: Lab Results  Component Value Date   HGBA1C 5.5 05/16/2019   MPG 111.15 05/16/2019   MPG 122.63 10/15/2018   Lab Results  Component Value Date   PROLACTIN 2.2 (L) 05/16/2019   PROLACTIN 3.4 (L) 10/15/2018   Lab Results  Component Value Date   CHOL 190 06/22/2019   TRIG 60 06/22/2019   HDL 41 06/22/2019   CHOLHDL 4.6 06/22/2019   VLDL 12 06/22/2019   LDLCALC 137 (H) 06/22/2019   LDLCALC 98 05/16/2019     Physical Findings: AIMS: Facial and Oral Movements Muscles of Facial Expression: None, normal Lips and Perioral Area: None, normal Jaw: None, normal Tongue: None, normal,Extremity Movements Upper (arms, wrists, hands, fingers): None, normal Lower (legs, knees, ankles, toes): None, normal, Trunk Movements Neck, shoulders, hips: None, normal, Overall Severity Severity  of abnormal movements (highest score from questions above): None, normal Incapacitation due to abnormal movements: None, normal Patient's awareness of abnormal movements (rate only patient's report): No Awareness, Dental Status Current problems with teeth and/or dentures?: No Does patient usually wear dentures?: No  CIWA:    COWS:     Musculoskeletal: Strength & Muscle Tone: within normal limits Gait & Station: normal Patient leans: N/A  Psychiatric Specialty Exam: Physical Exam  ROS  Blood pressure 115/77, pulse 75, temperature 98.3 F (36.8 C), temperature source Oral, resp. rate 18, height 6' (1.829 m), weight 111.1 kg, SpO2 100 %.Body mass index is 33.23 kg/m.  General Appearance: Casual  Eye Contact:  Fair  Speech:  Normal Rate  Volume:  Decreased  Mood:  Dysphoric  Affect:  Blunt  Thought Process:  Irrelevant and Descriptions of Associations: Loose  Orientation:  Full (Time, Place, and Person)  Thought Content:  Tangential  Suicidal Thoughts:  No  Homicidal Thoughts:  No  Memory:  Immediate;   Fair Recent;   Fair Remote;   Fair  Judgement:  Fair  Insight:  Fair  Psychomotor Activity:  Normal  Concentration:  Concentration: Good and Attention Span: Fair  Recall:  [employer]  Fund of Knowledge:  Fair  Language:  Fair  Akathisia:  Negative  Handed:  Right  AIMS (if indicated):     Assets:  Physical Health Resilience  ADL's:  Intact  Cognition:  WNL  Sleep:  Number of Hours: 6.5   Treatment Plan Summary: Daily contact with patient to assess and evaluate symptoms and progress in treatment and  Medication management Continue Haldol administer long-acting injectable haloperidol his last Abilify shot was due on the 15th so it is okay to transition to something else because the long-acting aripiprazole simply did not hold him.  No change in precautions today   Bernese Doffing, MD 06/26/2019, 12:58 PM

## 2019-06-26 NOTE — [facility] Group Notes (Cosign Needed)
[facility] LCSW Group Therapy Note  Date/Time: 1:30pm 06/26/2019  Type of Therapy/Topic:  Group Therapy:  Feelings about Diagnosis  Participation Level:  Did Not Attend   Mood: Pt did not attend    Description of Group:    This group will allow patients to explore their thoughts and feelings about diagnoses they have received. Patients will be guided to explore their level of understanding and acceptance of these diagnoses. Facilitator will encourage patients to process their thoughts and feelings about the reactions of others to their diagnosis, and will guide patients in identifying ways to discuss their diagnosis with significant others in their lives. This group will be process-oriented, with patients participating in exploration of their own experiences as well as giving and receiving support and challenge from other group members.   Therapeutic Goals: 1. Patient will demonstrate understanding of diagnosis as evidence by identifying two or more symptoms of the disorder:  2. Patient will be able to express two feelings regarding the diagnosis 3. Patient will demonstrate ability to communicate their needs through discussion and/or role plays  Summary of Patient Progress:  Pt did not attend group session.     Therapeutic Modalities:   Cognitive Behavioral Therapy Brief Therapy Feelings Identification   Cortney Mckinney, MSW intern   

## 2019-06-26 NOTE — Progress Notes (Signed)
Psychoeducational Group Note  Date:  06/26/2019 Time:  2041  Group Topic/Focus:  Wrap-Up Group:   The focus of this group is to help patients review their daily goal of treatment and discuss progress on daily workbooks.  Participation Level: Did Not Attend  Participation Quality:  Not Applicable  Affect:  Not Applicable  Cognitive:  Not Applicable  Insight:  Not Applicable  Engagement in Group: Not Applicable  Additional Comments:  The patient did not attend group since he was asleep in his room.   Archie Balboa S 06/26/2019, 8:41 PM

## 2019-06-26 NOTE — Progress Notes (Signed)
Recreation Therapy Notes  Date: 11.19.20 Time: 0945 Location: [address] Dayroom  Group Topic: Leisure Education  Goal Area(s) Addresses:  Patient will identify positive leisure activities.  Patient will identify one positive benefit of participation in leisure activities.   Intervention: Leisure Group Game  Activity: Leisure Freight forwarder.  LRT and patients played a game of pictionary.  One person would get a word from the container and draw it on the board, the person that guesses the picture would get the next turn.  Each person had one minute.  Education:  Leisure Education, Dentist  Education Outcome: Acknowledges education/In group clarification offered/Needs additional education  Clinical Observations/Feedback: Pt did not attend group.    [name], LRT/CTRS         [name] A 06/26/2019 11:50 AM

## 2019-06-27 IMAGING — US ULTRASOUND ABDOMEN LIMITED
1 series · 14 of 25 positions shown · non-contrast
Comparison: Ultrasound right upper quadrant October 12, 2018; CT
abdomen and pelvis October 18, 2018

CLINICAL DATA: Nausea and vomiting

EXAM:
ULTRASOUND ABDOMEN LIMITED RIGHT UPPER QUADRANT

[Series 1: ultrasound abdomen limited · 14 of 45 slices shown]
[im 1/45]
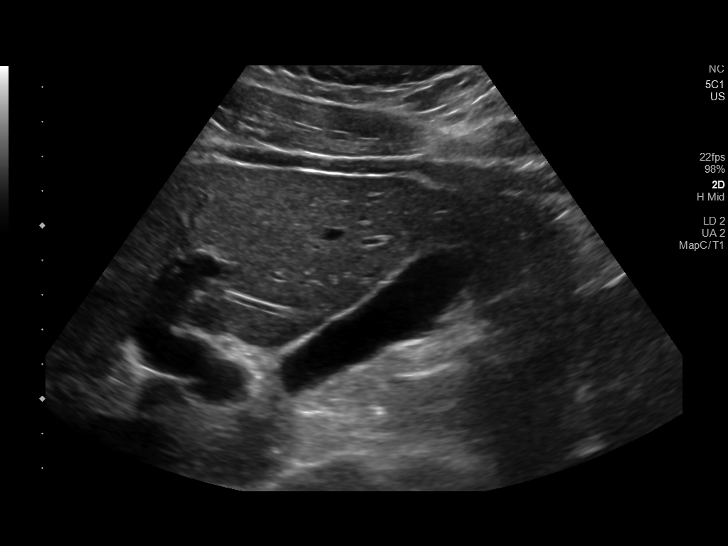
[im 4/45]
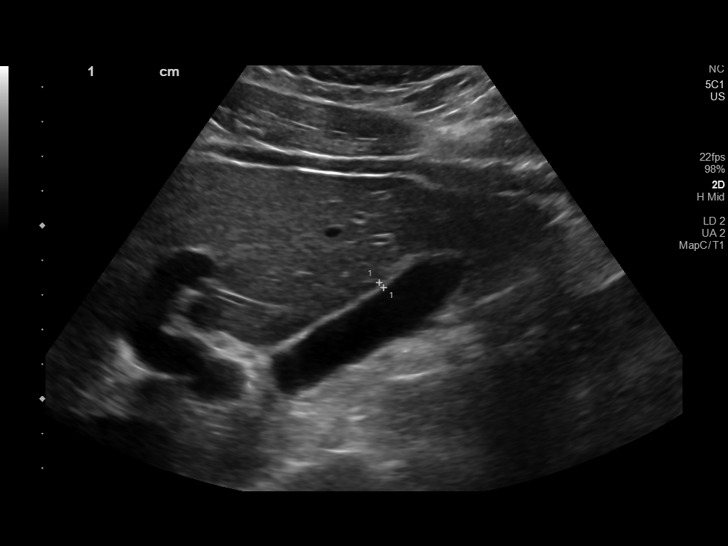
[im 8/45]
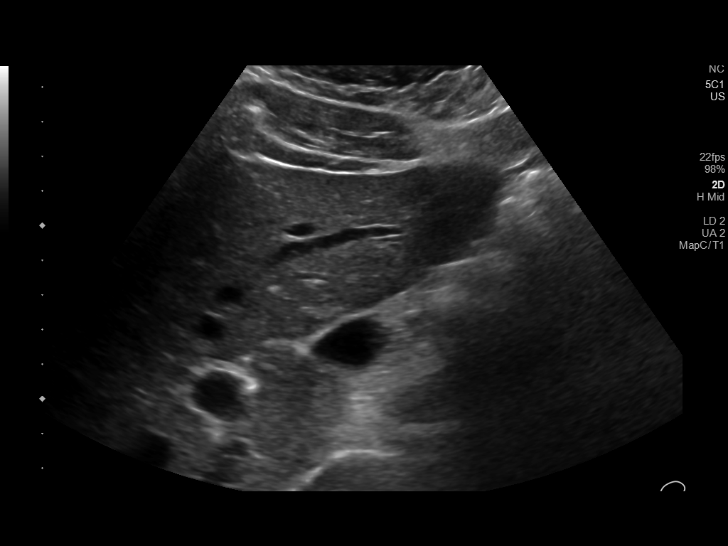
[im 12/45]
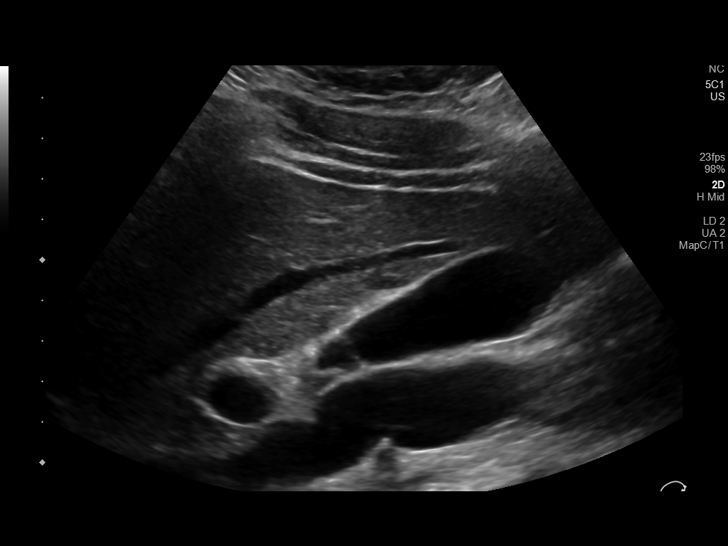
[im 15/45]
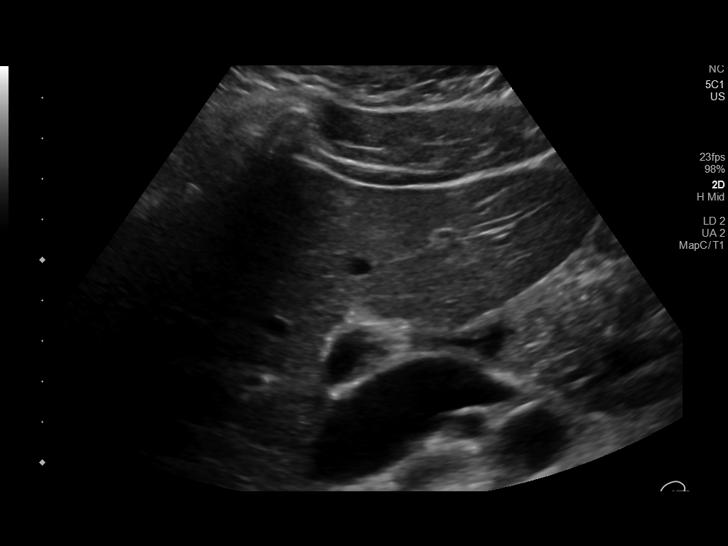
[im 17/45]
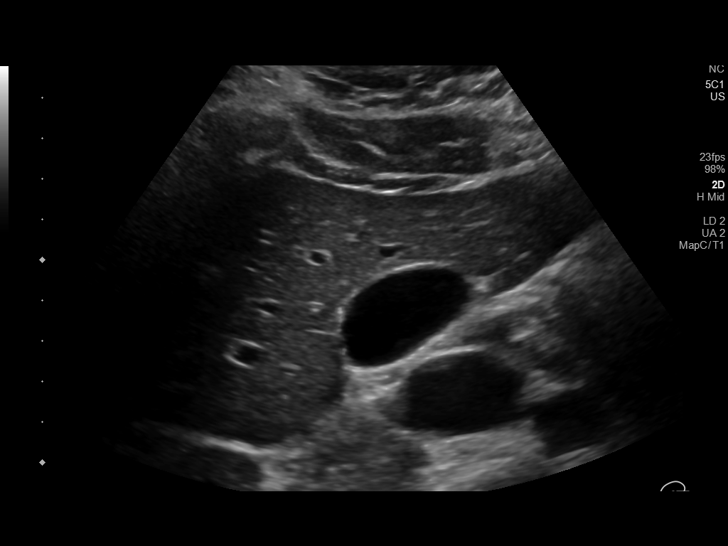
[im 21/45]
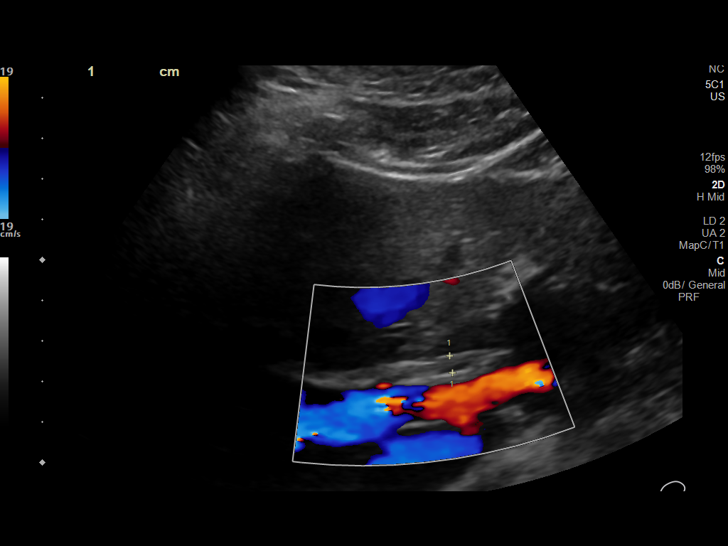
[im 24/45]
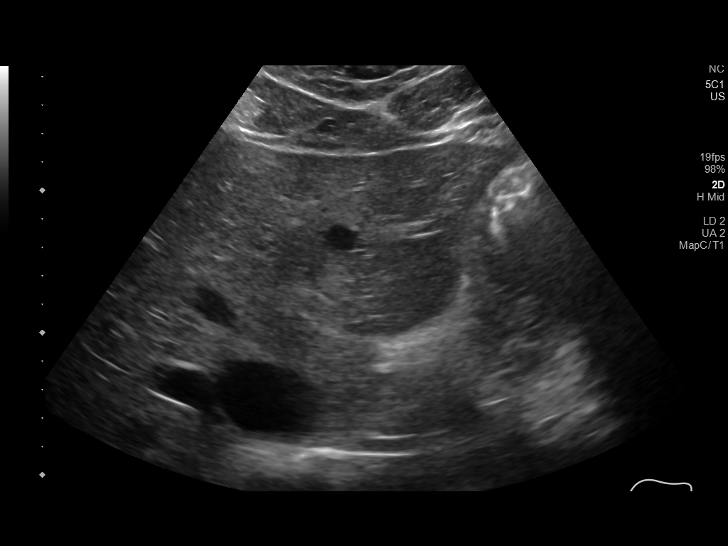
[im 28/45]
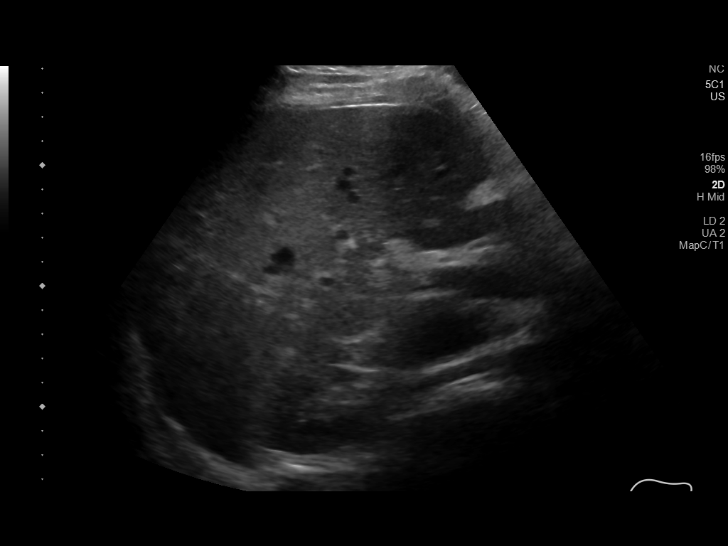
[im 30/45]
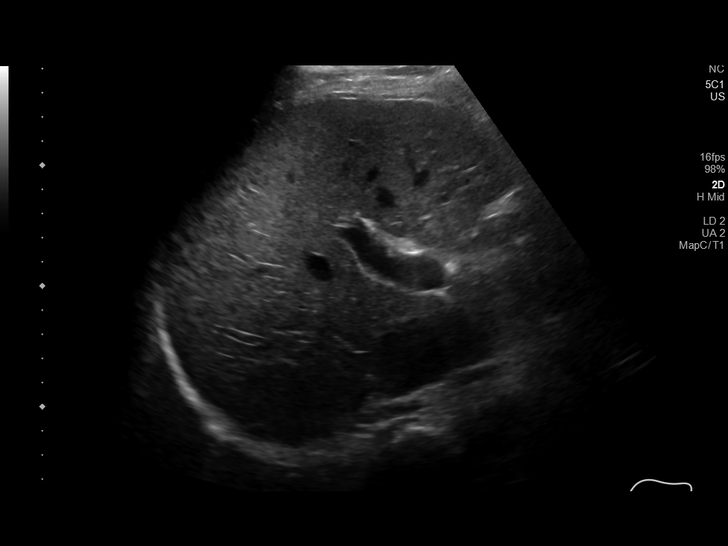
[im 34/45]
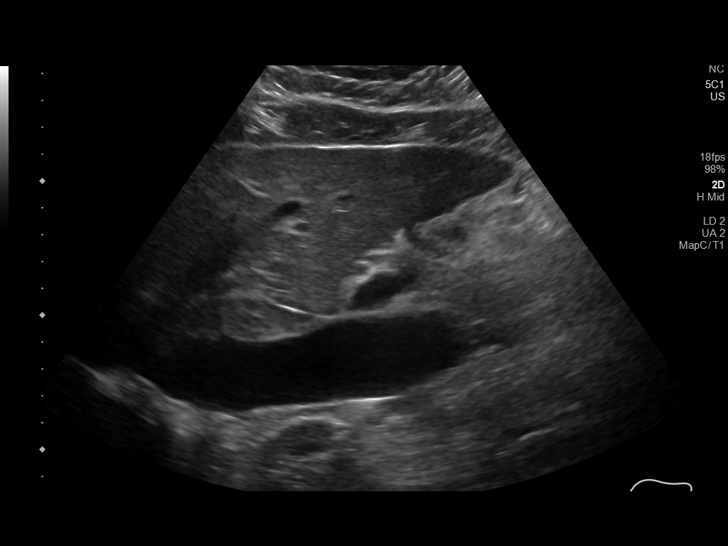
[im 37/45]
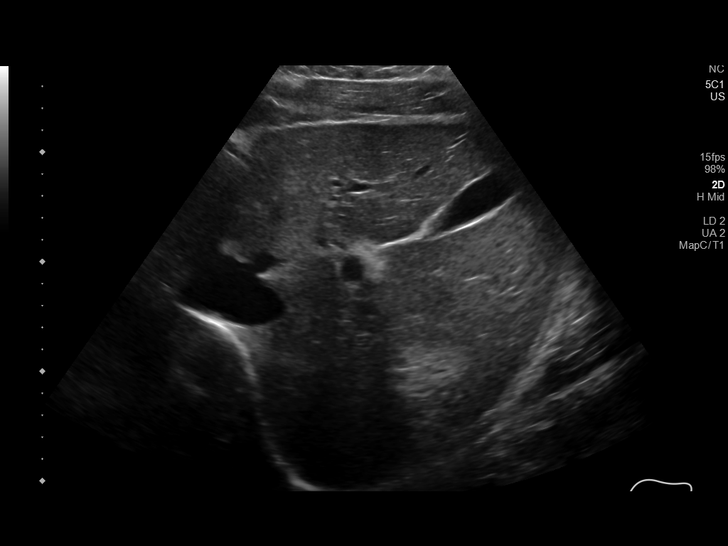
[im 41/45]
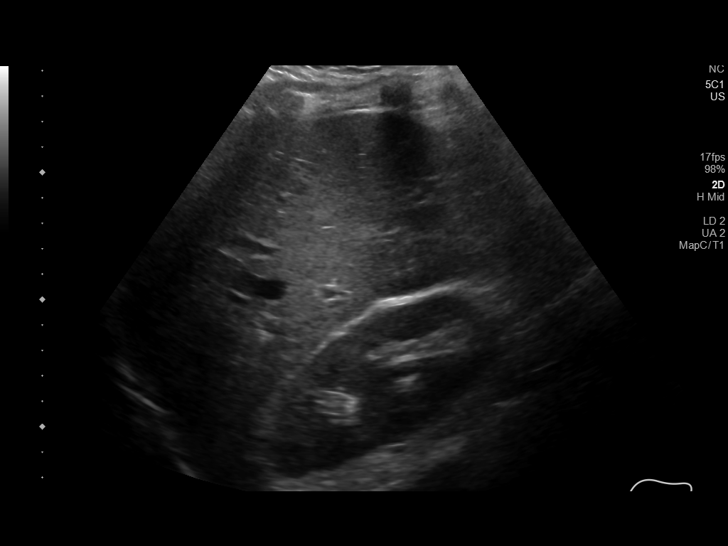
[im 45/45]
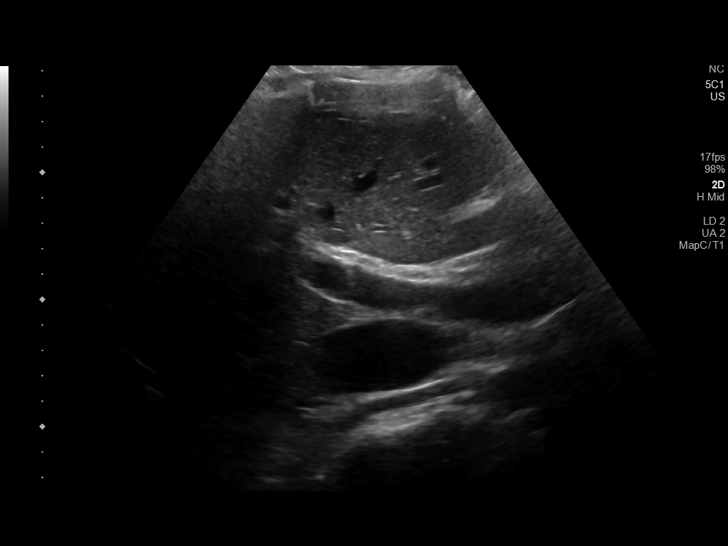

[14 of 25 positions shown; findings below may reference images not displayed]

FINDINGS: Gallbladder:

No gallstones or wall thickening visualized. There is no
pericholecystic fluid. No sonographic Murphy sign noted by
sonographer.

Common bile duct:

Diameter: 4 mm. No intrahepatic or extrahepatic biliary duct
dilatation.

Liver:

No focal lesion identified. Within normal limits in parenchymal
echogenicity. Portal vein is patent on color Doppler imaging with
normal direction of blood flow towards the liver.
IMPRESSION: Study within normal limits.

## 2019-06-27 MED ORDER — TRIAMCINOLONE ACETONIDE 0.1 % EX OINT: TOPICAL_OINTMENT | Freq: Two times a day (BID) | CUTANEOUS | 0 refills | Status: DC

## 2019-06-27 MED ORDER — LORATADINE 10 MG PO TABS: 10.0000 mg | ORAL_TABLET | Freq: Every day | ORAL | Status: DC

## 2019-06-27 MED ORDER — HALOPERIDOL 10 MG PO TABS: 10.0000 mg | ORAL_TABLET | Freq: Two times a day (BID) | ORAL | 0 refills | Status: DC

## 2019-06-27 MED ORDER — HYDROXYZINE HCL 25 MG PO TABS: 25.0000 mg | ORAL_TABLET | Freq: Three times a day (TID) | ORAL | 0 refills | Status: AC | PRN

## 2019-06-27 MED ORDER — HYDROCERIN EX CREA: 1.0000 "application " | TOPICAL_CREAM | Freq: Two times a day (BID) | CUTANEOUS | 0 refills | Status: DC

## 2019-06-27 MED ORDER — MIRTAZAPINE 30 MG PO TABS: 30.0000 mg | ORAL_TABLET | Freq: Every day | ORAL | 0 refills | Status: DC

## 2019-06-27 MED ORDER — CLONAZEPAM 0.5 MG PO TABS: 0.5000 mg | ORAL_TABLET | Freq: Two times a day (BID) | ORAL | 0 refills | Status: DC

## 2019-06-27 MED ORDER — CALAMINE EX LOTN: TOPICAL_LOTION | Freq: Two times a day (BID) | CUTANEOUS | 0 refills | Status: DC

## 2019-06-27 MED ORDER — HALOPERIDOL 5 MG PO TABS
10.0000 mg | ORAL_TABLET | Freq: Two times a day (BID) | ORAL | Status: DC
  Filled 2019-06-27 (×4): qty 2

## 2019-06-27 MED ORDER — BENZTROPINE MESYLATE 1 MG PO TABS: 1.0000 mg | ORAL_TABLET | Freq: Two times a day (BID) | ORAL | 0 refills | Status: DC

## 2019-06-27 MED ORDER — HALOPERIDOL DECANOATE 100 MG/ML IM SOLN: 150.0000 mg | INTRAMUSCULAR | 0 refills | Status: DC

## 2019-06-27 MED ORDER — HYDROCORTISONE 1 % EX CREA: TOPICAL_CREAM | Freq: Two times a day (BID) | CUTANEOUS | 0 refills | Status: DC

## 2019-06-27 MED ORDER — PREDNISONE 10 MG PO TABS: 10.0000 mg | ORAL_TABLET | Freq: Two times a day (BID) | ORAL | 0 refills | Status: DC

## 2019-06-27 NOTE — [facility] Suicide Risk Assessment (Signed)
[facility] Discharge Suicide Risk Assessment   Principal Problem: Schizoaffective disorder, depressive type ([name]) Discharge Diagnoses: Principal Problem:   Schizoaffective disorder, depressive type ([name]) Active Problems:   Schizoaffective disorder, bipolar type ([name])   Total Time spent with patient: 45 minutes  Musculoskeletal: Strength & Muscle Tone: within normal limits Gait & Station: normal Patient leans: N/A  Psychiatric Specialty Exam: ROS  Blood pressure 119/82, pulse 80, temperature 97.8 F (36.6 C), temperature source Oral, resp. rate 18, height 6' (1.829 m), weight 111.1 kg, SpO2 100 %.Body mass index is 33.23 kg/m.  General Appearance: Casual  Eye Contact::  Good  Speech:  Clear and Coherent409  Volume:  Decreased  Mood:  Dysphoric  Affect:  Flat  Thought Process:  Coherent, Linear and Descriptions of Associations: Circumstantial  Orientation:  Full (Time, Place, and Person)  Thought Content:  Denies any sort of hallucination or paranoia  Suicidal Thoughts:  No  Homicidal Thoughts:  No  Memory:  Immediate;   Fair Recent;   Fair Remote;   Fair  Judgement:  Fair  Insight:  Fair  Psychomotor Activity:  Normal  Concentration:  Fair  Recall:  [employer] of Knowledge:Fair  Language: Fair  Akathisia:  Negative  Handed:  Right  AIMS (if indicated):     Assets:  Communication Skills Desire for Improvement Housing Leisure Time Physical Health  Sleep:  Number of Hours: 8.25  Cognition: WNL  ADL's:  Intact   Mental Status Per Nursing Assessment::   On Admission:  Suicidal ideation indicated by patient  Demographic Factors:  Male  Loss Factors: Decrease in vocational status  Historical Factors: NA  Risk Reduction Factors:   Positive social support  Continued Clinical Symptoms:  Schizophrenia:   Less than 46 years old  Cognitive Features That Contribute To Risk:  Loss of executive function    Suicide Risk:  Minimal: No identifiable suicidal ideation.   Patients presenting with no risk factors but with morbid ruminations; may be classified as minimal risk based on the severity of the depressive symptoms  Follow-up Information    [facility], [facility] Follow up.   Why: Your intake assessment for ACTT team is on December 3rd @ 11:30am at the office. Please bring your hospital discharge paperwork, insurance card, and medication list with you.  Contact information: [address] [place] [id number] [phone]           Plan Of Care/Follow-up recommendations:  Activity:  full  Marvin Alire, MD 06/27/2019, 10:31 AM

## 2019-06-27 NOTE — Progress Notes (Signed)
Nursing 1:1 note D:Pt observed sleeping in bed with eyes closed. RR even and unlabored. No distress noted. A: 1:1 observation continues for safety  R: pt remains safe  

## 2019-06-27 NOTE — Progress Notes (Signed)
  [facility] Adult Case Management Discharge Plan :  Will you be returning to the same living situation after discharge:  Yes,  home  At discharge, do you have transportation home?: Yes,  pt's mother Do you have the ability to pay for your medications: Yes,  medicaid   Release of information consent forms completed and in the chart;  Patient's signature needed at discharge.  Patient to Follow up at: Follow-up Information    [facility], [facility] Follow up.   Why: Your intake assessment for ACTT team is on December 3rd @ 11:30am at the office. Please bring your hospital discharge paperwork, insurance card, and medication list with you.  Contact information: [address] [place] [address] [phone]           Next level of care provider has access to [place] and Suicide Prevention discussed: Yes,  with pt's mother   Have you used any form of tobacco in the last 30 days? (Cigarettes, Smokeless Tobacco, Cigars, and/or Pipes): Yes  Has patient been referred to the Quitline?: Patient refused referral  Patient has been referred for addiction treatment: Yes  Billey Chang, Student-Social Work 06/27/2019, 9:07 AM

## 2019-06-27 NOTE — Progress Notes (Signed)
Recreation Therapy Notes  INPATIENT RECREATION TR PLAN  Patient Details Name: Marvin Crawford MRN: [id number] DOB: 1995/08/19 Today's Date: 06/27/2019  Rec Therapy Plan Is patient appropriate for Therapeutic Recreation?: Yes Treatment times per week: about 3 days Estimated Length of Stay: 5-7 days TR Treatment/Interventions: Group participation (Comment)  Discharge Criteria Pt will be discharged from therapy if:: Discharged Treatment plan/goals/alternatives discussed and agreed upon by:: Patient/family  Discharge Summary Short term goals set: See patient care plan Short term goals met: Not met Reason goals not met: Pt did not attend group sessions. Therapeutic equipment acquired: N/A Reason patient discharged from therapy: Discharge from hospital Pt/family agrees with progress & goals achieved: Yes Date patient discharged from therapy: 06/27/19    [name], LRT/CTRS  Ria Comment, Anshul Meddings A 06/27/2019, 12:03 PM

## 2019-06-27 NOTE — Progress Notes (Signed)
Nursing 1:1 note D:Pt observed sleeping in bed with eyes closed. RR even and unlabored. No distress noted. A: 1:1 observation continues for safety  R: pt remains safe  1:1 while awake continues 

## 2019-06-27 NOTE — Discharge Summary (Signed)
Physician Discharge Summary Note  Patient:  Marvin Crawford is an 23 y.o., male  MRN:  [id number]  DOB:  08-Jul-1996  Patient phone:  [phone] (home)   Patient address:   [address] [place] [address],   Total Time spent with patient: Greater than 30 minutes  Date of Admission:  06/22/2019  Date of Discharge: 06-27-19  Reason for Admission: Patient was noted to have been decompensating over the last 2 weeks, reportedly responding to internal stimuli, talking to himself, with disorganized behavior such as going outside in the rain without shoes on, eating poorly and neglecting ADLs/hygiene.  Principal Problem: Schizoaffective disorder, depressive type [facility])  Discharge Diagnoses: Patient Active Problem List   Diagnosis Date Noted  . Schizoaffective disorder, depressive type (HCC) [F25.1] 06/22/2019  . Schizoaffective disorder, bipolar type (HCC) [F25.0] 06/22/2019  . Sepsis (HCC) [A41.9] 10/18/2018  . Intractable nausea and vomiting [R11.2] 10/18/2018  . Hypokalemia [E87.6] 10/18/2018  . Schizophrenia, acute (HCC) [F23] 10/14/2018  . Schizoaffective disorder (HCC) [F25.9] 07/06/2017  . 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 Stuttering [F80.81] 08/09/2015  . Cannabis use disorder, moderate, dependence (HCC) [F12.20] 08/09/2015  . MDD (major depressive disorder), recurrent, severe, with psychosis (HCC) [F33.3] 02/16/2013   Past Psychiatric History: Major depressive disorder, Cannabis abuse  Past Medical History:  Past Medical History:  Diagnosis Date  . ADD (attention deficit disorder)   . Anxiety   . Depression   . Environmental allergies   . Schizo-affective psychosis (HCC)    History reviewed. No pertinent surgical history.  Family History:  Family History  Problem Relation Age of Onset  . Hypertension Mother   . Hypertension Father    . Mental illness Neg Hx    Family Psychiatric  History: See H&P  Social History:  Social History   Substance and Sexual Activity  Alcohol Use Yes  . Alcohol/week: 1.0 standard drinks  . Types: 1 Standard drinks or equivalent per week   Comment: "once in a blue moon"     Social History   Substance and Sexual Activity  Drug Use Yes  . Types: Other-see comments, Marijuana   Comment: oil smoked in pipe calls "DAB", friends buy for him    Social History   Socioeconomic History  . Marital status: Single    Spouse name: Not on file  . Number of children: Not on file  . Years of education: Not on file  . Highest education level: Not on file  Occupational History  . Not on file  Social Needs  . Financial resource strain: Patient refused  . Food insecurity    Worry: Patient refused    Inability: Patient refused  . Transportation needs    Medical: Patient refused    Non-medical: Patient refused  Tobacco Use  . Smoking status: Current Every Day Smoker    Packs/day: 2.00    Start date: 02/05/2019  . Smokeless tobacco: Never Used  Substance and Sexual Activity  . Alcohol use: Yes    Alcohol/week: 1.0 standard drinks    Types: 1 Standard drinks or equivalent per week    Comment: "once in a blue moon"  . Drug use: Yes    Types: Other-see comments, Marijuana    Comment: oil smoked in pipe calls "DAB", friends buy for him  . Sexual activity: Yes    Birth control/protection: Condom  Lifestyle  . Physical activity  Days per week: Patient refused    Minutes per session: Patient refused  . Stress: Not on file  Relationships  . Social Musician on phone: Patient refused    Gets together: Patient refused    Attends religious service: Patient refused    Active member of club or organization: Patient refused    Attends meetings of clubs or organizations: Patient refused    Relationship status: Patient refused  Other Topics Concern  . Not on file  Social History  Narrative   Graduated from high school at age 6. He took IEP classes until he was in 10th grade. In 11th and 12th grade, he took normal classes and says he didn't need any extra help. He did 2 months of college at [employer], and this was difficult for him. He dropped out of college, due to difficulty balancing work and school.    Hospital Course: (Per Md's admission evaluation): 23 year old single male, presented to hospital in the company of his mother.  Mother reported that patient had been decompensating over the last 2 weeks, reportedly responding to internal stimuli, talking to self, with disorganized behavior such as going outside in the rain without shoes on, eating poorly and neglecting ADLs/hygiene. Patient presents depressed, blunted/flat in affect, with poor speech, answering most questions with monosyllables or short phrases.  Presents slowed, bradypsychic, with significant latency of speech noted.  Of note, he is alert, attentive and is oriented x3 at this time.  He describes feeling sad, but cannot identify reason or trigger for worsening mood.  Endorses some neurovegetative symptoms, mainly poor sleep and some anhedonia.  Reports suicidal ideations with recent thoughts of overdosing. He also acknowledges auditory hallucinations, but does not elaborate on content of these. When asked whether he has been taking his home psychiatric medications he responds "no" -cannot specify when he stopped meds (had been discharged on Haloperidol, Cogentin following his most recent admission in October/2020)  This is one several discharge summaries from this [facility] for Chandra, a 23 year old AA male with hx of chronic mental illness. He is known in this [facility] from his previous stay for mood stabilization treatments. He was recently discharged from this hospital on 05-20-19 after receiving mood stabilization treatments. He was discharged to follow-up care for routine psychiatric & medication management on an outpatient.  However, he was re-admitted to the [facility] hospital with worsening symptoms of depression, disorganization requiring further mood stabilization treatments. Per admission reports, patient has been decompensating over the last 2 weeks, reportedly responding to internal stimuli, talking to himself, with disorganized behavior such as going outside in the rain without shoes on, eating poorly and neglecting ADLs/hygiene.  After evaluation of his current presentating symptoms, Orton was started on the medication regimen targeting those presenting symptoms. He received, stabilized & was discharged on the medications as listed on the discharge medications lists below. He was also enrolled & participated in the group counseling sessions being offered and held on this unit, he learned coping skills that should help him cope better & maintain mood stability after discharge. He was literarily on  He presented 1:1 supervision for most of his days here. Other skin rashes/inflammation of which he received topical creams & Prednisone tabs for 7 doses, he presented no other significant pre-existing health issues that required treatment and or monitoring. He tolerated his treatment regimen without any significant adverse effects and or reactions reported or noted.  During the course of his hospitalizations, Jashaun's symptoms were evaluated  on daily basis by a clinical provider to assure they are responding to his treatment regimen. This is evidenced by his reports of decreasing symptoms, improved mood, sleep, appetite & presentation of good affect. He is currently being discharged to continue routine psychiatric care & medication management on an outpatient basis as noted below. He is provided with all the pertinent information required to make this appointment without problems.   On this day of hospital discharge, Bishop is in much improved condition than upon admission. Upon his discharge evaluation with the attending  psychiatrist, he stated that he is feeling more control of his mood. His symptoms were reported as significantly decreased or resolved completely. He denies any SI/HI & voiced no AVH. He is instructed & motivated to continue taking medications with a goal of continued improvement in mental health. He was able to engage in safety planning including plan to return to [facility] or contact emergency services if he feels unable to maintain his own safety or the safety of others. Pt had no further questions, comments, or concerns. He left [facility] with all personal belongings in no apparent distress. Transportation per his mother.   Physical Findings: AIMS: Facial and Oral Movements Muscles of Facial Expression: None, normal Lips and Perioral Area: None, normal Jaw: None, normal Tongue: None, normal,Extremity Movements Upper (arms, wrists, hands, fingers): None, normal Lower (legs, knees, ankles, toes): None, normal, Trunk Movements Neck, shoulders, hips: None, normal, Overall Severity Severity of abnormal movements (highest score from questions above): None, normal Incapacitation due to abnormal movements: None, normal Patient's awareness of abnormal movements (rate only patient's report): No Awareness, Dental Status Current problems with teeth and/or dentures?: No Does patient usually wear dentures?: No  CIWA:    COWS:     Musculoskeletal: Strength & Muscle Tone: within normal limits Gait & Station: normal Patient leans: N/A  Psychiatric Specialty Exam: Review of Systems  Constitutional: Negative.   HENT: Negative.   Eyes: Negative.   Respiratory: Negative.   Cardiovascular: Negative.   Gastrointestinal: Negative.   Genitourinary: Negative.   Musculoskeletal: Negative.   Skin: Negative.   Neurological: Negative.   Endo/Heme/Allergies: Negative.   Psychiatric/Behavioral: Positive for depression (Stabilized with medication prior to discharge). Negative for hallucinations, memory loss, substance  abuse and suicidal ideas. The patient has insomnia (Stabilized with medication prior to discharge). The patient is not nervous/anxious.     Blood pressure 119/82, pulse 80, temperature 98.3 F (36.8 C), temperature source Oral, resp. rate 18, height 6' (1.829 m), weight 111.1 kg, SpO2 100 %.Body mass index is 33.23 kg/m.  See Md's discharge SRA   Have you used any form of tobacco in the last 30 days? (Cigarettes, Smokeless Tobacco, Cigars, and/or Pipes): Yes  Has this patient used any form of tobacco in the last 30 days? (Cigarettes, Smokeless Tobacco, Cigars, and/or Pipes), No  Metabolic Disorder Labs:  Lab Results  Component Value Date   HGBA1C 5.5 05/16/2019   MPG 111.15 05/16/2019   MPG 122.63 10/15/2018   Lab Results  Component Value Date   PROLACTIN 2.2 (L) 05/16/2019   PROLACTIN 3.4 (L) 10/15/2018   Lab Results  Component Value Date   CHOL 190 06/22/2019   TRIG 60 06/22/2019   HDL 41 06/22/2019   CHOLHDL 4.6 06/22/2019   VLDL 12 06/22/2019   LDLCALC 137 (H) 06/22/2019   LDLCALC 98 05/16/2019   See Psychiatric Specialty Exam and Suicide Risk Assessment completed by Attending Physician prior to discharge.  Discharge destination:  Home  Is patient on multiple antipsychotic therapies at discharge:  No   Has Patient had three or more failed trials of antipsychotic monotherapy by history:  No  Recommended Plan for Multiple Antipsychotic Therapies: NA  Allergies as of 06/27/2019      Reactions   Peanut-containing Drug Products Other (See Comments)   Pt reports "my throat swells and I have trouble swallowing"      Medication List    STOP taking these medications   ARIPiprazole ER 400 MG Srer injection Commonly known as: ABILIFY MAINTENA   Descovy 200-25 MG tablet Generic drug: emtricitabine-tenofovir AF   traZODone 100 MG tablet Commonly known as: DESYREL     TAKE these medications     Indication  benztropine 1 MG tablet Commonly known as: COGENTIN Take  1 tablet (1 mg total) by mouth 2 (two) times daily. For prevention of drug induced tremors What changed: additional instructions  Indication: Extrapyramidal Reaction caused by Medications   calamine lotion Apply topically 2 (two) times daily. (May buy from over the counter): For rashes  Indication: Skin rashes   clonazePAM 0.5 MG tablet Commonly known as: KLONOPIN Take 1 tablet (0.5 mg total) by mouth 2 (two) times daily. For anxiety  Indication: Feeling Anxious   haloperidol 10 MG tablet Commonly known as: HALDOL Take 1 tablet (10 mg total) by mouth 2 (two) times daily. For mood control What changed:   how much to take  how to take this  when to take this  additional instructions  Indication: Mood control   haloperidol decanoate 100 MG/ML injection Commonly known as: HALDOL DECANOATE Inject 1.5 mLs (150 mg total) into the muscle every 30 (thirty) days. (Due on 07-25-19): For mood control Start taking on: July 25, 2019  Indication: Mood control   hydrocerin Crea Apply 1 application topically 2 (two) times daily. For dry skin  Indication: Dry skin   hydrocortisone cream 1 % Apply topically 2 (two) times daily. For rashes/itchiing  Indication: Skin Inflammation, Itching   hydrOXYzine 25 MG tablet Commonly known as: ATARAX/VISTARIL Take 1 tablet (25 mg total) by mouth 3 (three) times daily as needed for anxiety.  Indication: Feeling Anxious   loratadine 10 MG tablet Commonly known as: CLARITIN Take 1 tablet (10 mg total) by mouth daily. (May buy from over the counter): For allergies Start taking on: June 28, 2019  Indication: Perennial Allergic Rhinitis, Hayfever   mirtazapine 30 MG tablet Commonly known as: REMERON Take 1 tablet (30 mg total) by mouth at bedtime. For depression  Indication: Major Depressive Disorder   predniSONE 10 MG tablet Commonly known as: DELTASONE Take 1 tablet (10 mg total) by mouth 2 (two) times daily with a meal. For  inflammation  Indication: Inflammation   triamcinolone ointment 0.1 % Commonly known as: KENALOG Apply topically 2 (two) times daily. For skin rashes  Indication: Allergic Contact Dermatitis      Follow-up Information    [facility] Follow up.   Why: Your intake assessment for ACTT team is on December 3rd @ 11:30am at the office. Please bring your hospital discharge paperwork, insurance card, and medication list with you.  Contact information: [address] [place] [id number] [phone]          Follow-up recommendations: Activity:  As tolerated Diet: As recommended by your primary care doctor. Keep all scheduled follow-up appointments as recommended.    Comments: Prescriptions given at discharge.  Patient agreeable to plan.  Given opportunity to  ask questions.  Appears to feel comfortable with discharge denies any current suicidal or homicidal thought. Patient is also instructed prior to discharge to: Take all medications as prescribed by his/her mental healthcare provider. Report any adverse effects and or reactions from the medicines to his/her outpatient provider promptly. Patient has been instructed & cautioned: To not engage in alcohol and or illegal drug use while on prescription medicines. In the event of worsening symptoms, patient is instructed to call the crisis hotline, 911 and or go to the nearest ED for appropriate evaluation and treatment of symptoms. To follow-up with his/her primary care provider for your other medical issues, concerns and or health care needs.   Signed: Lindell Spar, PMHNP, FNP-BC 06/27/2019, 9:34 AM

## 2019-06-27 NOTE — Plan of Care (Signed)
Pt did not attend recreation therapy group sessions.   Marvin Crawford, LRT/CTRS 

## 2019-06-27 NOTE — Tx Team (Signed)
Interdisciplinary Treatment and Diagnostic Plan Update  06/27/2019 Time of Session: 10:25 am  Marvin Crawford MRN: [id number]  Principal Diagnosis: Schizoaffective disorder, depressive type [facility])  Secondary Diagnoses: Principal Problem:   Schizoaffective disorder, depressive type ([place]) Active Problems:   Schizoaffective disorder, bipolar type ([place])   Current Medications:  Current Facility-Administered Medications  Medication Dose Route Frequency Provider Last Rate Last Dose  . acetaminophen (TYLENOL) tablet 650 mg  650 mg Oral Q6H PRN Lindon Romp A, NP      . alum & mag hydroxide-simeth (MAALOX/MYLANTA) 200-200-20 MG/5ML suspension 30 mL  30 mL Oral Q4H PRN Rozetta Nunnery, NP      . calamine lotion   Topical BID Suella Broad, FNP      . clonazePAM (KLONOPIN) tablet 0.5 mg  0.5 mg Oral BID Johnn Hai, MD   0.5 mg at 06/27/19 5462  . haloperidol (HALDOL) tablet 10 mg  10 mg Oral BID Lindell Spar I, NP      . haloperidol decanoate (HALDOL DECANOATE) 100 MG/ML injection 150 mg  150 mg Intramuscular Q30 days Johnn Hai, MD   150 mg at 06/26/19 1804  . hydrocerin (EUCERIN) cream   Topical BID Derrill Center, NP      . hydrocortisone cream 1 %   Topical BID Derrill Center, NP      . hydrOXYzine (ATARAX/VISTARIL) tablet 25 mg  25 mg Oral TID PRN Rozetta Nunnery, NP   25 mg at 06/26/19 0138  . loratadine (CLARITIN) tablet 10 mg  10 mg Oral Daily Suella Broad, FNP   10 mg at 06/27/19 0827  . magnesium hydroxide (MILK OF MAGNESIA) suspension 30 mL  30 mL Oral Daily PRN Lindon Romp A, NP      . mirtazapine (REMERON) tablet 30 mg  30 mg Oral QHS Johnn Hai, MD   30 mg at 06/26/19 0138  . predniSONE (DELTASONE) tablet 10 mg  10 mg Oral BID WC Johnn Hai, MD   10 mg at 06/27/19 0829  . triamcinolone ointment (KENALOG) 0.1 %   Topical BID Suella Broad, FNP       PTA Medications: Medications Prior to Admission  Medication Sig Dispense Refill Last Dose  .  ARIPiprazole ER (ABILIFY MAINTENA) 400 MG SRER injection Inject 2 mLs (400 mg total) into the muscle every 28 (twenty-eight) days. 1 each 0 Past Week at Unknown time  . DESCOVY 200-25 MG tablet Take 1 tablet by mouth daily.   Past Week at Unknown time  . haloperidol (HALDOL) 10 MG tablet 1 in am 2 at h s 90 tablet 2 Past Week at Unknown time  . traZODone (DESYREL) 100 MG tablet Take 1 tablet (100 mg total) by mouth at bedtime as needed for sleep. 90 tablet 1 Past Week at Unknown time  . [DISCONTINUED] benztropine (COGENTIN) 1 MG tablet Take 1 tablet (1 mg total) by mouth 2 (two) times daily. 60 tablet 2 Past Week at Unknown time    Patient Stressors: Other: patient reports feeling depressed and worrying d/t covid-19  Patient Strengths: Ability for insight Supportive family/friends  Treatment Modalities: Medication Management, Group therapy, Case management,  1 to 1 session with clinician, Psychoeducation, Recreational therapy.   Physician Treatment Plan for Primary Diagnosis: Schizoaffective disorder, depressive type ([place]) Long Term Goal(s): Improvement in symptoms so as ready for discharge Improvement in symptoms so as ready for discharge   Short Term Goals: Ability to identify changes in lifestyle to reduce recurrence  of condition will improve Ability to identify and develop effective coping behaviors will improve Ability to maintain clinical measurements within normal limits will improve Ability to identify changes in lifestyle to reduce recurrence of condition will improve Ability to verbalize feelings will improve Ability to maintain clinical measurements within normal limits will improve Ability to identify triggers associated with substance abuse/mental health issues will improve  Medication Management: Evaluate patient's response, side effects, and tolerance of medication regimen.  Therapeutic Interventions: 1 to 1 sessions, Unit Group sessions and Medication  administration.  Evaluation of Outcomes: Adequate for Discharge  Physician Treatment Plan for Secondary Diagnosis: Principal Problem:   Schizoaffective disorder, depressive type (HCC) Active Problems:   Schizoaffective disorder, bipolar type (HCC)  Long Term Goal(s): Improvement in symptoms so as ready for discharge Improvement in symptoms so as ready for discharge   Short Term Goals: Ability to identify changes in lifestyle to reduce recurrence of condition will improve Ability to identify and develop effective coping behaviors will improve Ability to maintain clinical measurements within normal limits will improve Ability to identify changes in lifestyle to reduce recurrence of condition will improve Ability to verbalize feelings will improve Ability to maintain clinical measurements within normal limits will improve Ability to identify triggers associated with substance abuse/mental health issues will improve     Medication Management: Evaluate patient's response, side effects, and tolerance of medication regimen.  Therapeutic Interventions: 1 to 1 sessions, Unit Group sessions and Medication administration.  Evaluation of Outcomes: Adequate for Discharge   RN Treatment Plan for Primary Diagnosis: Schizoaffective disorder, depressive type (HCC) Long Term Goal(s): Knowledge of disease and therapeutic regimen to maintain health will improve  Short Term Goals: Ability to participate in decision making will improve, Ability to verbalize feelings will improve, Ability to disclose and discuss suicidal ideas, Ability to identify and develop effective coping behaviors will improve and Compliance with prescribed medications will improve  Medication Management: RN will administer medications as ordered by provider, will assess and evaluate patient's response and provide education to patient for prescribed medication. RN will report any adverse and/or side effects to prescribing  provider.  Therapeutic Interventions: 1 on 1 counseling sessions, Psychoeducation, Medication administration, Evaluate responses to treatment, Monitor vital signs and CBGs as ordered, Perform/monitor CIWA, COWS, AIMS and Fall Risk screenings as ordered, Perform wound care treatments as ordered.  Evaluation of Outcomes: Adequate for Discharge   LCSW Treatment Plan for Primary Diagnosis: Schizoaffective disorder, depressive type (HCC) Long Term Goal(s): Safe transition to appropriate next level of care at discharge, Engage patient in therapeutic group addressing interpersonal concerns.  Short Term Goals: Engage patient in aftercare planning with referrals and resources and Increase skills for wellness and recovery  Therapeutic Interventions: Assess for all discharge needs, 1 to 1 time with Social worker, Explore available resources and support systems, Assess for adequacy in community support network, Educate family and significant other(s) on suicide prevention, Complete Psychosocial Assessment, Interpersonal group therapy.  Evaluation of Outcomes: Adequate for Discharge   Progress in Treatment: Attending groups: No. Participating in groups: No. Taking medication as prescribed: Yes. Toleration medication: Yes. Family/Significant other contact made: Yes, individual(s) contacted:  pt's mother  Patient understands diagnosis: Yes. Discussing patient identified problems/goals with staff: Yes. Medical problems stabilized or resolved: Yes. Denies suicidal/homicidal ideation: Yes. Issues/concerns per patient self-inventory: No. Other:   New problem(s) identified: No, Describe:  none   New Short Term/Long Term Goal(s): Medication stabilization, elimination of SI thoughts, and development of a comprehensive mental wellness  plan.   Patient Goals:  Pt did not state goals   Discharge Plan or Barriers: Pt is ready for discharge   Reason for Continuation of Hospitalization: Delusions   Depression Hallucinations Medication stabilization  Estimated Length of Stay: Pt is ready for discharge   Attendees: Patient: Marvin Crawford 06/27/2019 10:57 AM  Physician: Dr. Jeannine Kitten, MD 06/27/2019 10:57 AM  Nursing: Marton Redwood, RN 06/27/2019 10:57 AM  RN Care Manager: 06/27/2019 10:57 AM  Social Worker: Stephannie Peters, LCSW 06/27/2019 10:57 AM  Recreational Therapist:  06/27/2019 10:57 AM  Other: Earlyne Iba, MSW intern  06/27/2019 10:57 AM  Other:  06/27/2019 10:57 AM  Other: 06/27/2019 10:57 AM     Scribe for Treatment Team: Reynold Bowen, Student-Social Work 06/27/2019 11:02 AM

## 2019-06-27 NOTE — Progress Notes (Signed)
Patient ID: Marvin Crawford, male   DOB: October 13, 1995, 23 y.o.   MRN: [id number] Patient discharged to home/self care in the presence of his family patient denies SI, HI and AVH upon discharge.  Patient acknowledged understanding of all discharge instructions and receipt of personal belongings.

## 2019-06-27 NOTE — Progress Notes (Signed)
Nursing 1:1 note D:Pt observed sleeping in bed with eyes closed. RR even and unlabored. No distress noted. A: 1:1 observation continues for safety  R: pt remains safe  1:1 while awake continues

## 2019-06-27 NOTE — Progress Notes (Signed)
Recreation Therapy Notes  Date: 11.20.20 Time: 1000 Location: 500 Hall Dayroom  Group Topic: Coping Skills  Goal Area(s) Addresses:  Patient will identify positive coping skills. Patient will identify benefits of using positive coping skills post d/c.  Intervention: Worksheet  Activity: Healthy vs. Unhealthy Coping Strategies.  Patients were to identify a problem they are currently facing.  Patients then identified the unhealthy coping strategies and consequences.  Patients also identified healthy coping strategies, expected outcomes and barriers to using healthy coping strategies.  Education: Radiographer, therapeutic, Dentist.   Education Outcome: Acknowledges understanding/In group clarification offered/Needs additional education.   Clinical Observations/Feedback: Pt did not attend group.     [name], LRT/CTRS         [name] A 06/27/2019 11:52 AM

## 2019-07-05 ENCOUNTER — Ambulatory Visit (HOSPITAL_COMMUNITY)
Admission: RE | Admit: 2019-07-05 | Discharge: 2019-07-05 | Disposition: A | Discharge status: Home / Self Care | Payer: Medicaid Other | Attending: Psychiatry | Admitting: Psychiatry

## 2019-07-05 DIAGNOSIS — F988 Other specified behavioral and emotional disorders with onset usually occurring in childhood and adolescence: Secondary | ICD-10-CM | POA: Diagnosis not present

## 2019-07-05 DIAGNOSIS — F251 Schizoaffective disorder, depressive type: Secondary | ICD-10-CM | POA: Diagnosis not present

## 2019-07-05 DIAGNOSIS — Z91010 Allergy to peanuts: Secondary | ICD-10-CM | POA: Diagnosis not present

## 2019-07-05 DIAGNOSIS — F419 Anxiety disorder, unspecified: Secondary | ICD-10-CM | POA: Diagnosis not present

## 2019-07-05 DIAGNOSIS — F17200 Nicotine dependence, unspecified, uncomplicated: Secondary | ICD-10-CM | POA: Insufficient documentation

## 2019-07-05 DIAGNOSIS — F259 Schizoaffective disorder, unspecified: Secondary | ICD-10-CM | POA: Insufficient documentation

## 2019-07-05 NOTE — H&P (Signed)
Behavioral Health Medical Screening Exam  Marvin Crawford is an 23 y.o. male presents with mother and mobile crisis Insurance underwriter. Mobile crisis worker Sophronia Simas cited that she was told that patient has been leaving the house without permission and walking around the complex nude. Reported these events happened 1 weeks ago.   Mother reported that he recently had a medication adjustment while inpatient however patient continues to responded to internal stimuli. Denied any self injuresours behaviors.  Mother reports she has attempted to reach out to [facility] for follow-up care however states due to Covid she has been unsuccessful with scheduling therapy sessions and/or reaching patients primary psychiatrist.  [name] is well know to this service. Patient is denying suicidal or homicidal ideations. Patient does report leaving the house without permission.  Denies depression or depressive symptoms.  Reports auditory hallucinations denies they are command in nature.  Support, encouragement and reassurance was provided.    Total Time spent with patient: 15 minutes  Psychiatric Specialty Exam: Physical Exam  Vitals reviewed. Constitutional: He appears well-developed.  Psychiatric: He has a normal mood and affect. His behavior is normal.    ROS  There were no vitals taken for this visit.There is no height or weight on file to calculate BMI.  General Appearance: Casual  Eye Contact:  Fair  Speech:  Slow  Volume:  Normal  Mood:  Anxious and Depressed  Affect:  Congruent  Thought Process:  Coherent  Orientation:  Full (Time, Place, and Person)  Thought Content:  Hallucinations: Auditory  Suicidal Thoughts:  No  Homicidal Thoughts:  No  Memory:  Immediate;   Fair Recent;   Fair  Judgement:  Fair  Insight:  Fair  Psychomotor Activity:  Normal  Concentration: Concentration: Fair  Recall:  [employer] of Knowledge:Fair  Language: Fair  Akathisia:  No  Handed:  Right  AIMS (if indicated):     Assets:   Communication Skills Desire for Improvement Resilience Social Support  Sleep:       Musculoskeletal: Strength & Muscle Tone: within normal limits Gait & Station: normal Patient leans: N/A  There were no vitals taken for this visit. Keep follow-up appointment with outpatient providers Resources were provided for mother for support groups for families dealing with mental illness.  Recommendations: Based on my evaluation the patient does not appear to have an emergency medical condition.  Derrill Center, NP 07/05/2019, 6:45 PM

## 2019-07-05 NOTE — BH Assessment (Signed)
Assessment Note  Marvin Crawford is an 23 y.o. single male who presents to [facility] with [name], Dewitt Hoes [phone], and Terri with [facility]. Pt was evaluated first by Hillery Jacks, NP and then this TTS counselor. Pt's has a diagnosis of schizoaffective disorder and was discharged from [facility] 06/27/19. Per mother's report to Hillery Jacks, [name] contacted [facility] Crisis because Pt is responding to internal stimuli and verbalized suicidal ideation. He also has leaving the house without permission and exhibited erratic behavior.   Pt states he is here for a medication adjustment. Pt says he doesn't always take medications as prescribed and cannot explain why. He describes his mood as "good." He denies most depressive symptoms. He denies problems with sleep or appetite. He denies current suicidal ideation. He denies intentional self-injurious behaviors. He denies current thoughts of harming others. He does acknowledge auditory hallucinations but cannot describe them. He denies they are command in nature. He denies visual hallucinations. He denies alcohol or other substance use.  Pt cannot identify any stressors. He says he lives with his mother and brother and identifies them as his primary support. He denies any history of abuse or trauma. He denies legal problems. He denies access to firearms. Mother reported to Lou Miner, NP she has attempted to reach out to [facility] for follow-up care however states due to Covid she has been unsuccessful with scheduling therapy sessions and/or reaching patients primary psychiatrist. Pt has been psychiatrically hospitalized several times at facilities including [facility] and [facility].  [name] had left the premises however Hillery Jacks, NP had already gathered collateral information.  Pt is casually dressed, alert and oriented x4. Pt speaks in a soft tone, at moderate volume and normal pace. Motor behavior appears  normal. Eye contact is good with some staring. Pt's mood is euthymic and affect is blunted. Thought process is coherent and relevant. Pt appears to be preoccupied at times and likely responding to internal stimuli. He was calm and cooperative throughout assessment.   Diagnosis: F25.1 Schizoaffective disorder, Depressive type  Past Medical History:  Past Medical History:  Diagnosis Date  . ADD (attention deficit disorder)   . Anxiety   . Depression   . Environmental allergies   . Schizo-affective psychosis (HCC)     No past surgical history on file.  Family History:  Family History  Problem Relation Age of Onset  . Hypertension Mother   . Hypertension Father   . Mental illness Neg Hx     Social History:  reports that he has been smoking. He started smoking about 4 months ago. He has been smoking about 2.00 packs per day. He has never used smokeless tobacco. He reports current alcohol use of about 1.0 standard drinks of alcohol per week. He reports current drug use. Drugs: Other-see comments and Marijuana.  Additional Social History:  Alcohol / Drug Use Pain Medications: Denies abuse Prescriptions: Denies abuse Over the Counter: Denies abuse History of alcohol / drug use?: No history of alcohol / drug abuse Longest period of sobriety (when/how long): NA  CIWA:   COWS:    Allergies:  Allergies  Allergen Reactions  . Peanut-Containing Drug Products Other (See Comments)    Pt reports "my throat swells and I have trouble swallowing"    Home Medications: (Not in a hospital admission)   OB/GYN Status:  No LMP for male patient.  General Assessment Data Location of Assessment: [facility] Assessment Services TTS Assessment: In  system Is this a Tele or Face-to-Face Assessment?: Face-to-Face Is this an Initial Assessment or a Re-assessment for this encounter?: Initial Assessment Patient Accompanied by:: Parent Language Other than English: No Living Arrangements: Other  (Comment)(Lives with mother and brother) What gender do you identify as?: Male Marital status: Single Maiden name: NA Pregnancy Status: No Living Arrangements: Parent, Other relatives Can pt return to current living arrangement?: Yes Admission Status: Voluntary Is patient capable of signing voluntary admission?: Yes Referral Source: Self/Family/Friend Insurance type: Medicaid  Medical Screening Exam ([place]) Medical Exam completed: Darci Current, NP)  Crisis Care Plan Living Arrangements: Parent, Other relatives Legal Guardian: Other:(Self) Name of Psychiatrist: [facility]  Name of Therapist: [facility]   Education Status Is patient currently in school?: No Highest grade of school patient has completed: 12th grade Is the patient employed, unemployed or receiving disability?: Unemployed  Risk to self with the past 6 months Suicidal Ideation: No Has patient been a risk to self within the past 6 months prior to admission? : Yes Suicidal Intent: No Has patient had any suicidal intent within the past 6 months prior to admission? : No Is patient at risk for suicide?: No Suicidal Plan?: No Has patient had any suicidal plan within the past 6 months prior to admission? : Yes Specify Current Suicidal Plan: Hang self with a belt Access to Means: Yes Specify Access to Suicidal Means: Access to household items What has been your use of drugs/alcohol within the last 12 months?: Pt denies Previous Attempts/Gestures: Yes How many times?: 2 Other Self Harm Risks: Chronic psychosis Triggers for Past Attempts: Unpredictable Intentional Self Injurious Behavior: None Family Suicide History: No Recent stressful life event(s): Other (Comment)(Pt denies) Persecutory voices/beliefs?: No Depression: Yes Depression Symptoms: Despondent, Isolating, Loss of interest in usual pleasures Substance abuse history and/or treatment for substance abuse?: No Suicide prevention information  given to non-admitted patients: Not applicable  Risk to Others within the past 6 months Homicidal Ideation: No Does patient have any lifetime risk of violence toward others beyond the six months prior to admission? : No Thoughts of Harm to Others: No Current Homicidal Intent: No Current Homicidal Plan: No Access to Homicidal Means: No Identified Victim: None History of harm to others?: No Assessment of Violence: None Noted Violent Behavior Description: Pt denies history of violence Does patient have access to weapons?: No Criminal Charges Pending?: No Does patient have a court date: No Is patient on probation?: No  Psychosis Hallucinations: Auditory Delusions: None noted  Mental Status Report Appearance/Hygiene: Other (Comment)(Casually dressed) Eye Contact: Good Motor Activity: Unremarkable Speech: Logical/coherent, Soft Level of Consciousness: Quiet/awake Mood: Euthymic Affect: Blunted Anxiety Level: None Thought Processes: Coherent, Relevant Judgement: Partial Orientation: Person, Place, Time, Situation Obsessive Compulsive Thoughts/Behaviors: None  Cognitive Functioning Concentration: Fair Memory: Recent Intact, Remote Intact Is patient IDD: No(Borderline intellectual functioning per medical record) Insight: Poor Impulse Control: Fair Appetite: Good Have you had any weight changes? : No Change Amount of the weight change? (lbs): 0 lbs Sleep: No Change Total Hours of Sleep: 8 Vegetative Symptoms: None  ADLScreening [facility] Assessment Services) Patient's cognitive ability adequate to safely complete daily activities?: Yes Patient able to express need for assistance with ADLs?: Yes Independently performs ADLs?: Yes (appropriate for developmental age)  Prior Inpatient Therapy Prior Inpatient Therapy: Yes Prior Therapy Dates: 06/2019, multiple admits Prior Therapy Facilty/Provider(s): [facility], [place] Reason for Treatment: Psychosis  Prior Outpatient  Therapy Prior Outpatient Therapy: Yes Prior Therapy Dates: Ongoing  Prior  Therapy Facilty/Provider(s): [facility]  Reason for Treatment: Medication management Does patient have an ACCT team?: No Does patient have Intensive In-House Services?  : No Does patient have [facility] services? : No Does patient have P4CC services?: No  ADL Screening (condition at time of admission) Patient's cognitive ability adequate to safely complete daily activities?: Yes Is the patient deaf or have difficulty hearing?: No Does the patient have difficulty seeing, even when wearing glasses/contacts?: No Does the patient have difficulty concentrating, remembering, or making decisions?: No Patient able to express need for assistance with ADLs?: Yes Does the patient have difficulty dressing or bathing?: No Independently performs ADLs?: Yes (appropriate for developmental age) Does the patient have difficulty walking or climbing stairs?: No Weakness of Legs: None Weakness of Arms/Hands: None  Home Assistive Devices/Equipment Home Assistive Devices/Equipment: None    Abuse/Neglect Assessment (Assessment to be complete while patient is alone) Abuse/Neglect Assessment Can Be Completed: Yes Physical Abuse: Denies Verbal Abuse: Denies Sexual Abuse: Denies Exploitation of patient/patient's resources: Denies Self-Neglect: Denies     Merchant navy officerAdvance Directives (For Healthcare) Does Patient Have a Medical Advance Directive?: No Would patient like information on creating a medical advance directive?: No - Patient declined          Disposition: Discuss case with Hillery Jacksanika Lewis, NP who completed MSE and determined Pt does not meet criteria for inpatient psychiatric treatment. Recommendation is that Pt follow up with current outpatient mental health provider. [name] given resources for support for caregivers of people with mental illness.  Disposition Initial Assessment Completed for this Encounter:  Yes Disposition of Patient: Discharge Patient refused recommended treatment: No Mode of transportation if patient is discharged/movement?: Car Patient referred to: Other (Comment)([facility])  On Site Evaluation by:  Hillery Jacksanika Lewis, NP Reviewed with Physician:    Pamalee LeydenFord Ellis Jayzen Paver Jr, [facility], [facility] Triage Specialist [phone]  Patsy BaltimoreWarrick Jr, Harlin RainFord Ellis 07/05/2019 7:46 PM

## 2019-11-27 ENCOUNTER — Other Ambulatory Visit: Payer: Self-pay | Admitting: Family Medicine

## 2019-11-28 ENCOUNTER — Observation Stay (HOSPITAL_COMMUNITY)
Admission: AD | Admit: 2019-11-28 | Discharge: 2019-11-28 | Disposition: A | Discharge status: Home / Self Care | Payer: Medicaid Other | Attending: Psychiatry | Admitting: Psychiatry

## 2019-11-28 ENCOUNTER — Emergency Department (HOSPITAL_COMMUNITY)
Admission: EM | Admit: 2019-11-28 | Discharge: 2019-11-30 | Disposition: A | Discharge status: Home / Self Care | Payer: Medicaid Other | Attending: Emergency Medicine | Admitting: Emergency Medicine

## 2019-11-28 ENCOUNTER — Emergency Department (HOSPITAL_COMMUNITY): Payer: Medicaid Other

## 2019-11-28 ENCOUNTER — Other Ambulatory Visit: Payer: Self-pay

## 2019-11-28 ENCOUNTER — Encounter (HOSPITAL_COMMUNITY): Payer: Self-pay

## 2019-11-28 DIAGNOSIS — U071 COVID-19: Secondary | ICD-10-CM | POA: Insufficient documentation

## 2019-11-28 DIAGNOSIS — R443 Hallucinations, unspecified: Secondary | ICD-10-CM | POA: Diagnosis present

## 2019-11-28 DIAGNOSIS — F259 Schizoaffective disorder, unspecified: Secondary | ICD-10-CM | POA: Diagnosis not present

## 2019-11-28 DIAGNOSIS — F209 Schizophrenia, unspecified: Principal | ICD-10-CM | POA: Insufficient documentation

## 2019-11-28 DIAGNOSIS — Z79899 Other long term (current) drug therapy: Secondary | ICD-10-CM | POA: Diagnosis not present

## 2019-11-28 DIAGNOSIS — F17210 Nicotine dependence, cigarettes, uncomplicated: Secondary | ICD-10-CM | POA: Diagnosis not present

## 2019-11-28 LAB — CBC WITH DIFFERENTIAL/PLATELET
Abs Immature Granulocytes: 0.01 10*3/uL (ref 0.00–0.07)
Basophils Absolute: 0 10*3/uL (ref 0.0–0.1)
Basophils Relative: 1 %
Eosinophils Absolute: 0.3 10*3/uL (ref 0.0–0.5)
Eosinophils Relative: 5 %
HCT: 36.5 % — ABNORMAL LOW (ref 39.0–52.0)
Hemoglobin: 11.9 g/dL — ABNORMAL LOW (ref 13.0–17.0)
Immature Granulocytes: 0 %
Lymphocytes Relative: 40 %
Lymphs Abs: 2.4 10*3/uL (ref 0.7–4.0)
MCH: 28.4 pg (ref 26.0–34.0)
MCHC: 32.6 g/dL (ref 30.0–36.0)
MCV: 87.1 fL (ref 80.0–100.0)
Monocytes Absolute: 0.4 10*3/uL (ref 0.1–1.0)
Monocytes Relative: 7 %
Neutro Abs: 2.9 10*3/uL (ref 1.7–7.7)
Neutrophils Relative %: 47 %
Platelets: 255 10*3/uL (ref 150–400)
RBC: 4.19 MIL/uL — ABNORMAL LOW (ref 4.22–5.81)
RDW: 13.2 % (ref 11.5–15.5)
WBC: 6 10*3/uL (ref 4.0–10.5)
nRBC: 0 % (ref 0.0–0.2)

## 2019-11-28 LAB — RESPIRATORY PANEL BY RT PCR (FLU A&B, COVID)
Influenza A by PCR: NEGATIVE
Influenza B by PCR: NEGATIVE
SARS Coronavirus 2 by RT PCR: POSITIVE — AB

## 2019-11-28 MED ORDER — CITALOPRAM HYDROBROMIDE 10 MG PO TABS
20.0000 mg | ORAL_TABLET | Freq: Every day | ORAL | Status: DC
  Administered 2019-11-29 – 2019-11-30 (×2): 20 mg via ORAL
  Filled 2019-11-28 (×2): qty 2

## 2019-11-28 MED ORDER — FLUOCINONIDE 0.05 % EX CREA
1.0000 "application " | TOPICAL_CREAM | Freq: Two times a day (BID) | CUTANEOUS | Status: DC
  Administered 2019-11-29 – 2019-11-30 (×3): 1 via TOPICAL
  Filled 2019-11-28 (×2): qty 15

## 2019-11-28 MED ORDER — HALOPERIDOL LACTATE 5 MG/ML IJ SOLN
5.0000 mg | Freq: Once | INTRAMUSCULAR | Status: AC
  Administered 2019-11-28: 5 mg via INTRAMUSCULAR
  Filled 2019-11-28: qty 1

## 2019-11-28 MED ORDER — HYDROXYZINE HCL 25 MG PO TABS
25.0000 mg | ORAL_TABLET | Freq: Three times a day (TID) | ORAL | Status: DC | PRN
  Administered 2019-11-29 – 2019-11-30 (×2): 25 mg via ORAL
  Filled 2019-11-28 (×2): qty 1

## 2019-11-28 MED ORDER — LORAZEPAM 1 MG PO TABS
2.0000 mg | ORAL_TABLET | Freq: Once | ORAL | Status: AC
  Administered 2019-11-28: 2 mg via ORAL
  Filled 2019-11-28: qty 4

## 2019-11-28 MED ORDER — AMANTADINE HCL 100 MG PO CAPS
100.0000 mg | ORAL_CAPSULE | Freq: Two times a day (BID) | ORAL | Status: DC
  Administered 2019-11-29 – 2019-11-30 (×4): 100 mg via ORAL
  Filled 2019-11-28 (×4): qty 1

## 2019-11-28 MED ORDER — METHOTREXATE 2.5 MG PO TABS: 2.5000 mg | ORAL_TABLET | Freq: Two times a day (BID) | ORAL | Status: DC

## 2019-11-28 MED ORDER — PALIPERIDONE ER 6 MG PO TB24
9.0000 mg | ORAL_TABLET | Freq: Every day | ORAL | Status: DC
  Administered 2019-11-29 – 2019-11-30 (×2): 9 mg via ORAL
  Filled 2019-11-28 (×2): qty 1

## 2019-11-28 MED ORDER — TRAZODONE HCL 100 MG PO TABS: 100.0000 mg | ORAL_TABLET | Freq: Every evening | ORAL | Status: DC | PRN

## 2019-11-28 MED ORDER — PALIPERIDONE PALMITATE ER 156 MG/ML IM SUSY: 1.0000 | PREFILLED_SYRINGE | INTRAMUSCULAR | Status: DC

## 2019-11-28 NOTE — Plan of Care (Signed)
[facility] Crisis Plan  Reason for Crisis Plan:  Crisis Stabilization   Plan of Care:  Referral for Inpatient Hospitalization  Family Support:      Current Living Environment:  Living Arrangements: Parent  Insurance:   Hospital Account    Name Acct ID Class Status Primary Coverage   Marvin Crawford, Marvin Crawford [id number] BEHAVIORAL HEALTH OBSERVATION [place]        Guarantor Account (for Hospital Account [id number])    Name Relation to Pt Service Area Active? Acct Type   Marvin Crawford Self [facility] Yes Behavioral Health   Address Phone       [address] [place], [place] [address] [phone](H)          Coverage Information (for Hospital Account [id number])    F/O Payor/Plan Precert #   [facility] [place]    Subscriber Subscriber #   Marius, Betts [id number]   Address Phone   [address] [place], [place] [address] [phone]      Legal Guardian:  Legal Guardian: (Self)  Primary Care Provider:  [facility]  Current Outpatient Providers:  [facility]  Psychiatrist:  Name of Psychiatrist: [facility]  Counselor/Therapist:  Name of Therapist: [facility]  Compliant with Medications:  No  Additional Information:   Curly Rim 4/23/20216:05 PM

## 2019-11-28 NOTE — Progress Notes (Signed)
Marvin Crawford is an 24 y.o. male that presents this date with IVC. Per IVC: "Respondent is a client of [facility] and has a diagnosis of Schizophrenia. Respondent is currently experiencing AVH. Respondent has been refusing medications. Respondent was at [facility] this morning and walked out of the building and took all his clothes off. His mother reports he is constantly walking out of his home and leaving the house without permission. Respondent is talking to voices and walks to get away from them. He is nonviolent and has not harmed himself or anyone else. Respondent is currently not taking his oral medications that is [facility] by [facility] A:Patient COVID test obtained awaiting results. Patient took prescribed medications as ordered. Patient unable to complete admission due to unable to answer questions and responding to internal stimuli.  R: Q 15 minute checks in progress and patient safety maintained. Monitoring continues.

## 2019-11-28 NOTE — Progress Notes (Signed)
Received called from [facility] lab tech at this time that patients COVID test with positive test results. Will report to Optician, dispensing. Reported to oncoming nurse Latricia RN.

## 2019-11-28 NOTE — BH Assessment (Addendum)
Assessment Note  Marvin Crawford is an 24 y.o. male that presents this date with IVC. Per IVC: "Respondent is a client of [facility] and has a diagnosis of Schizophrenia. Respondent is currently experiencing AVH. Respondent has been refusing medications. Respondent was at [facility] this morning and walked out of the building and took all his clothes off. His mother reports he is constantly walking out of his home and leaving the house without permission. Respondent is talking to voices and walks to get away from them. He is nonviolent and has not harmed himself or anyone else. Respondent is currently not taking his oral medications that is prescribed by the [facility]. Patient was assessed at [facility] as a walk in this date. Patient is unaware why he presents this date. Patient per collateral received from staff nurse on admission reported that patient has been non compliant with his oral Invega for the last month. Patient's Haldol was discontinued one month ago and Invega was started at that time. IM Lorayne Bender was administered this date since patient has not been taking his oral medication. Patient currently resides with his mother Marvin Crawford [phone] and patient receives OP services from the [place] who assist with medication management. Patient on arrival renders limited information and is unaware why he presents this date. Patient responds with yes and no answers. Patient is noted to answer only a few questions at the onset of the assessment. Patient denies any S/I, H/I or AVH although this writer is uncertain if patient is processing the content of this writer's questions. This writer attempted to contact patient's mother this date to assist with collateral information unsuccessfully. Patient per chart has no history of violence although two prior attempts at self harm are noted in the chart. Patient was last seen on 07/05/19 when he presented with similar symptoms. Patient is not able  to participate in the assessment due to current altered mental status. Information to complete assessment was obtained from history and chart review.  Per notes patient currently resides with his mother and brother. Patient receives disability and has no history of abuse.   Patient is casually dressed, alert and oriented only to person. He cannot identify the year. Patient speaks in a soft tone, at low volume and normal pace. Motor behavior appears normal. Eye contact is minimal. Patient's mood is depressed and affect is blunted. Patient's insight and judgment are impaired. It is unclear if patient is responding to internal stimuli at the time of assessment. Case was staffed with Reita Cliche DNP who recommended a inpatient admission.   Diagnosis: F25.1 Schizoaffective disorder, Depressive type  Past Medical History:  Past Medical History:  Diagnosis Date  . ADD (attention deficit disorder)   . Anxiety   . Depression   . Environmental allergies   . Schizo-affective psychosis ([place])     No past surgical history on file.  Family History:  Family History  Problem Relation Age of Onset  . Hypertension Mother   . Hypertension Father   . Mental illness Neg Hx     Social History:  reports that he has been smoking. He started smoking about 9 months ago. He has been smoking about 2.00 packs per day. He has never used smokeless tobacco. He reports current alcohol use of about 1.0 standard drinks of alcohol per week. He reports current drug use. Drugs: Other-see comments and Marijuana.  Additional Social History:  Alcohol / Drug Use Pain Medications: See MAR Prescriptions: See MAR Over the Counter: See  MAR History of alcohol / drug use?: No history of alcohol / drug abuse  CIWA: CIWA-Ar BP: 133/79 Pulse Rate: (!) 115 COWS:    Allergies:  Allergies  Allergen Reactions  . Peanut-Containing Drug Products Other (See Comments)    Pt reports "my throat swells and I have trouble swallowing"     Home Medications: (Not in a hospital admission)   OB/GYN Status:  No LMP for male patient.  General Assessment Data Location of Assessment: [facility] Assessment Services TTS Assessment: In system Is this a Tele or Face-to-Face Assessment?: Face-to-Face Is this an Initial Assessment or a Re-assessment for this encounter?: Initial Assessment Patient Accompanied by:: N/A Language Other than English: No Living Arrangements: Other (Comment)(Parents) What gender do you identify as?: Male Marital status: Single Living Arrangements: Parent Can pt return to current living arrangement?: Yes Admission Status: Involuntary Petitioner: Other Is patient capable of signing voluntary admission?: No Referral Source: Other(Day program) Insurance type: Medicaid     Crisis Care Plan Living Arrangements: Parent Legal Guardian: (Self) Name of Psychiatrist: [facility] ) Name of Therapist: [facility]  Education Status Is patient currently in school?: No Is the patient employed, unemployed or receiving disability?: Receiving disability income  Risk to self with the past 6 months Suicidal Ideation: No Has patient been a risk to self within the past 6 months prior to admission? : No Suicidal Intent: No Has patient had any suicidal intent within the past 6 months prior to admission? : No Is patient at risk for suicide?: No Suicidal Plan?: No Has patient had any suicidal plan within the past 6 months prior to admission? : No Access to Means: No What has been your use of drugs/alcohol within the last 12 months?: Denies Previous Attempts/Gestures: Yes How many times?: 2 Other Self Harm Risks: (Off medications) Triggers for Past Attempts: Unknown Intentional Self Injurious Behavior: None Family Suicide History: No Recent stressful life event(s): Other (Comment)(Off medications) Persecutory voices/beliefs?: No Depression: (UTA) Depression Symptoms: (UTA) Substance abuse history and/or  treatment for substance abuse?: No Suicide prevention information given to non-admitted patients: Not applicable  Risk to Others within the past 6 months Homicidal Ideation: No Does patient have any lifetime risk of violence toward others beyond the six months prior to admission? : No Thoughts of Harm to Others: No Current Homicidal Intent: No Current Homicidal Plan: No Access to Homicidal Means: No Identified Victim: NA History of harm to others?: No Assessment of Violence: None Noted Violent Behavior Description: NA Does patient have access to weapons?: No Criminal Charges Pending?: No Does patient have a court date: No Is patient on probation?: No  Psychosis Hallucinations: Auditory, Visual Delusions: None noted  Mental Status Report Appearance/Hygiene: Unremarkable Eye Contact: Poor Motor Activity: Freedom of movement Speech: Soft, Slow Level of Consciousness: Quiet/awake Mood: Preoccupied Affect: Appropriate to circumstance Anxiety Level: None Thought Processes: Unable to Assess Judgement: Unable to Assess Orientation: Unable to assess Obsessive Compulsive Thoughts/Behaviors: Unable to Assess  Cognitive Functioning Concentration: Unable to Assess Memory: Unable to Assess Is patient IDD: No Insight: Unable to Assess Impulse Control: Unable to Assess Appetite: (UTA) Have you had any weight changes? : (UTA) Sleep: (UTA) Total Hours of Sleep: (UTA) Vegetative Symptoms: (UTA)  ADLScreening [facility] Assessment Services) Patient's cognitive ability adequate to safely complete daily activities?: Yes Patient able to express need for assistance with ADLs?: Yes Independently performs ADLs?: Yes (appropriate for developmental age)  Prior Inpatient Therapy Prior Inpatient Therapy: Yes Prior Therapy Dates: Multiple Prior Therapy Facilty/Provider(s): [facility],  [facility] Reason for Treatment: MH issues  Prior Outpatient Therapy Prior Outpatient Therapy: Yes Prior Therapy  Dates: Ongoing Prior Therapy Facilty/Provider([facility]): [facility]  Reason for Treatment: Med mang Does patient have an ACCT team?: No Does patient have Intensive In-House Services?  : No Does patient have [facility] services? : No Does patient have P4CC services?: No  ADL Screening (condition at time of admission) Patient's cognitive ability adequate to safely complete daily activities?: Yes Is the patient deaf or have difficulty hearing?: No Does the patient have difficulty seeing, even when wearing glasses/contacts?: No Does the patient have difficulty concentrating, remembering, or making decisions?: Yes Patient able to express need for assistance with ADLs?: Yes Does the patient have difficulty dressing or bathing?: No Independently performs ADLs?: Yes (appropriate for developmental age) Does the patient have difficulty walking or climbing stairs?: No Weakness of Legs: None Weakness of Arms/Hands: None  Home Assistive Devices/Equipment Home Assistive Devices/Equipment: None  Therapy Consults (therapy consults require a physician order) PT Evaluation Needed: No OT Evalulation Needed: No SLP Evaluation Needed: No Abuse/Neglect Assessment (Assessment to be complete while patient is alone) Abuse/Neglect Assessment Can Be Completed: Yes Physical Abuse: Denies Verbal Abuse: Denies Sexual Abuse: Denies Exploitation of patient/patient's resources: Denies Self-Neglect: Denies Values / Beliefs Cultural Requests During Hospitalization: None Spiritual Requests During Hospitalization: None Consults Spiritual Care Consult Needed: No Transition of Care Team Consult Needed: No Advance Directives (For Healthcare) Does Patient Have a Medical Advance Directive?: No Would patient like information on creating a medical advance directive?: No - Patient declined          Disposition: Case was staffed with Shaune Pollack DNP who recommended a inpatient admission.  Disposition Initial  Assessment Completed for this Encounter: Yes  On Site Evaluation by:   Reviewed with Physician:    Alfredia Ferguson 11/28/2019 1:43 PM

## 2019-11-28 NOTE — ED Triage Notes (Signed)
Pt brought by [facility] to transport from [facility]. Pt was admitted for auditory hallucinations and mania. Pt is IVC'ed.

## 2019-11-28 NOTE — Progress Notes (Signed)
Patient ID: Marvin Crawford, male   DOB: 1996/06/22, 24 y.o.   MRN: [id number] Pt awake, alert & responsive, resting at present, no distress noted.  Pt tested COVID POSITIVE.  AC JoAnn notified.  Charge Nurse Elsie Ra Nash/[facility] notified.  [facility] called for transport.  [employer] contacted also.  Monitoring for safety.  Pending transfer to [facility].

## 2019-11-28 NOTE — BH Assessment (Signed)
[facility] Assessment Progress Note   Case was staffed with Shaune Pollack DNP who recommended a inpatient admission.

## 2019-11-28 NOTE — Progress Notes (Signed)
[facility] & [facility] at bedside to transport pt to [facility], RM 28.  Pt is COVID PoSITIVE.  Left awake, alert & responsive, no distress noted, via stretcher.

## 2019-11-28 NOTE — ED Notes (Signed)
Pt is aware that a urine is needed. Cup placed at bedside.

## 2019-11-28 NOTE — ED Provider Notes (Signed)
[facility] DEPT Provider Note   CSN: [id number] Arrival date & time: 11/28/19  2107     History Chief Complaint  Patient presents with  . Hallucinations    Pt is from [facility] and is covid postive.     Marvin Crawford is a 24 y.o. male with history of schizophrenia who presents from [facility] due to testing positive for COVID-19.  Patient is somnolent and not able to provide a history. He received IM Invega at [facility]. Apparently he was IVC by his psychiatrist today due to psychosis and not taking his medicines.  Patient presented to behavioral health as a walk-in and was directly admitted there.  Covid testing was obtained which came back positive and therefore he was transferred to the ED.  LEVEL 5 caveat due to psych/somnolence  HPI     Past Medical History:  Diagnosis Date  . ADD (attention deficit disorder)   . Anxiety   . Depression   . Environmental allergies   . Schizo-affective psychosis [facility])     Patient Active Problem List   Diagnosis Date Noted  . Schizoaffective disorder, depressive type ([place]) 06/22/2019  . Schizoaffective disorder, bipolar type ([place]) 06/22/2019  . Sepsis ([place]) 10/18/2018  . Intractable nausea and vomiting 10/18/2018  . Hypokalemia 10/18/2018  . Schizophrenia, acute ([place]) 10/14/2018  . Schizoaffective disorder ([place]) 07/06/2017  . Major depressive disorder, recurrent, severe with psychotic features ([place]) 07/05/2017  . 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 Stuttering 08/09/2015  . Cannabis use disorder, moderate, dependence ([place]) 08/09/2015  . MDD (major depressive disorder), recurrent, severe, with psychosis ([place]) 02/16/2013    History reviewed. No pertinent surgical history.     Family History  Problem Relation Age of Onset  . Hypertension Mother   . Hypertension Father   . Mental illness Neg Hx     Social History   Tobacco Use  . Smoking status: Current Every Day Smoker   Packs/day: 2.00    Start date: 02/05/2019  . Smokeless tobacco: Never Used  Substance Use Topics  . Alcohol use: Yes    Alcohol/week: 1.0 standard drinks    Types: 1 Standard drinks or equivalent per week    Comment: "once in a blue moon"  . Drug use: Yes    Types: Other-see comments, Marijuana    Comment: oil smoked in pipe calls "DAB", friends buy for him    Home Medications Prior to Admission medications   Medication Sig Start Date End Date Taking? Authorizing Provider  amantadine (SYMMETREL) 100 MG capsule Take 100 mg by mouth 2 (two) times daily. 10/20/19  Yes [provider]  citalopram (CELEXA) 20 MG tablet Take 20 mg by mouth daily. 11/26/19  Yes [provider]  fluocinonide cream (LIDEX) 0.16 % Apply 1 application topically 2 (two) times daily. 08/30/19  Yes [provider]  hydrOXYzine (ATARAX/VISTARIL) 25 MG tablet Take 1 tablet (25 mg total) by mouth 3 (three) times daily as needed for anxiety. 06/27/19  Yes Nwoko, Herbert Pun I, NP  INVEGA SUSTENNA 156 MG/ML SUSY injection Inject 1 Syringe into the muscle every 30 (thirty) days. 11/27/19  Yes [provider]  methotrexate (RHEUMATREX) 2.5 MG tablet Take 2.5 mg by mouth every 12 (twelve) hours. 11/06/19  Yes [provider]  paliperidone (INVEGA) 9 MG 24 hr tablet Take 9 mg by mouth daily. 11/26/19  Yes [provider]  benztropine (COGENTIN) 1 MG tablet Take 1 tablet (1 mg total) by mouth 2 (two)  times daily. For prevention of drug induced tremors Patient not taking: Reported on 11/28/2019 06/27/19   Armandina Stammer I, NP  calamine lotion Apply topically 2 (two) times daily. (May buy from over the counter): For rashes Patient not taking: Reported on 11/28/2019 06/27/19   Armandina Stammer I, NP  clonazePAM (KLONOPIN) 0.5 MG tablet Take 1 tablet (0.5 mg total) by mouth 2 (two) times daily. For anxiety Patient not taking: Reported on 11/28/2019 06/27/19   Armandina Stammer I, NP  haloperidol (HALDOL) 10  MG tablet Take 1 tablet (10 mg total) by mouth 2 (two) times daily. For mood control Patient not taking: Reported on 11/28/2019 06/27/19   Armandina Stammer I, NP  haloperidol decanoate (HALDOL DECANOATE) 100 MG/ML injection Inject 1.5 mLs (150 mg total) into the muscle every 30 (thirty) days. (Due on 07-25-19): For mood control Patient not taking: Reported on 11/28/2019 07/25/19   Armandina Stammer I, NP  hydrocerin (EUCERIN) CREA Apply 1 application topically 2 (two) times daily. For dry skin Patient not taking: Reported on 11/28/2019 06/27/19   Armandina Stammer I, NP  hydrocortisone cream 1 % Apply topically 2 (two) times daily. For rashes/itchiing Patient not taking: Reported on 11/28/2019 06/27/19   Armandina Stammer I, NP  loratadine (CLARITIN) 10 MG tablet Take 1 tablet (10 mg total) by mouth daily. (May buy from over the counter): For allergies Patient not taking: Reported on 11/28/2019 06/28/19   Armandina Stammer I, NP  mirtazapine (REMERON) 30 MG tablet Take 1 tablet (30 mg total) by mouth at bedtime. For depression Patient not taking: Reported on 11/28/2019 06/27/19   Armandina Stammer I, NP  predniSONE (DELTASONE) 10 MG tablet Take 1 tablet (10 mg total) by mouth 2 (two) times daily with a meal. For inflammation Patient not taking: Reported on 11/28/2019 06/27/19   Armandina Stammer I, NP  triamcinolone ointment (KENALOG) 0.1 % Apply topically 2 (two) times daily. For skin rashes Patient not taking: Reported on 11/28/2019 06/27/19   Armandina Stammer I, NP    Allergies    Peanut-containing drug products  Review of Systems   Review of Systems  Unable to perform ROS: Psychiatric disorder    Physical Exam Updated Vital Signs BP 134/79 (BP Location: Right Arm)   Pulse 88   Temp 98.4 F (36.9 C) (Oral)   Resp 17   SpO2 100%   Physical Exam Vitals and nursing note reviewed.  Constitutional:      General: He is sleeping. He is not in acute distress.    Appearance: Normal appearance. He is well-developed. He is not  ill-appearing.     Comments: PT will awake to voice but fall back asleep  HENT:     Head: Normocephalic and atraumatic.  Eyes:     General: No scleral icterus.       Right eye: No discharge.        Left eye: No discharge.     Conjunctiva/sclera: Conjunctivae normal.     Pupils: Pupils are equal, round, and reactive to light.  Cardiovascular:     Rate and Rhythm: Normal rate and regular rhythm.  Pulmonary:     Effort: Pulmonary effort is normal. No respiratory distress.     Breath sounds: Normal breath sounds.  Abdominal:     General: There is no distension.  Musculoskeletal:     Cervical back: Normal range of motion.  Skin:    General: Skin is warm and dry.     Findings: Rash (generalized rash on the  face, trunk, extremities that looks like psoriasis) present.  Neurological:     Mental Status: He is oriented to person, place, and time and easily aroused.  Psychiatric:        Behavior: Behavior normal.     ED Results / Procedures / Treatments   Labs (all labs ordered are listed, but only abnormal results are displayed) Labs Reviewed  COMPREHENSIVE METABOLIC PANEL - Abnormal; Notable for the following components:      Result Value   Glucose, Bld 109 (*)    Total Protein 6.3 (*)    ALT 46 (*)    All other components within normal limits  CBC WITH DIFFERENTIAL/PLATELET - Abnormal; Notable for the following components:   RBC 4.19 (*)    Hemoglobin 11.9 (*)    HCT 36.5 (*)    All other components within normal limits  ETHANOL  RAPID URINE DRUG SCREEN, HOSP PERFORMED    EKG None  Radiology DG Chest Port 1 View  Result Date: 11/28/2019 CLINICAL DATA:  Auditory hallucinations and mania. EXAM: PORTABLE CHEST 1 VIEW COMPARISON:  October 18, 2018 FINDINGS: The study is limited secondary to patient rotation and suboptimal patient inspiration. The heart size and mediastinal contours are within normal limits. Both lungs are clear. The visualized skeletal structures are  unremarkable. IMPRESSION: No active disease. Electronically Signed   By: Aram Candela M.D.   On: 11/28/2019 23:43    Procedures Procedures (including critical care time)  Medications Ordered in ED Medications  amantadine (SYMMETREL) capsule 100 mg (100 mg Oral Given 11/29/19 0916)  citalopram (CELEXA) tablet 20 mg (20 mg Oral Given 11/29/19 0916)  fluocinonide cream (LIDEX) 0.05 % 1 application (1 application Topical Given 11/29/19 0916)  hydrOXYzine (ATARAX/VISTARIL) tablet 25 mg (has no administration in time range)  paliperidone (INVEGA) 24 hr tablet 9 mg (9 mg Oral Given 11/29/19 9485)    ED Course  I have reviewed the triage vital signs and the nursing notes.  Pertinent labs & imaging results that were available during my care of the patient were reviewed by me and considered in my medical decision making (see chart for details).  24 year old with schizophrenia presents under IVC to behavioral health and was found to be Covid positive.  Patient is unable to contribute to history due to receiving Invega prior to arrival.  His vitals are reassuring.  Heart is regular rate and rhythm and lungs are clear to auscultation.  Will obtain labs, chest x-ray due to patient being Covid positive but pt is essentially asymptomatic.   MDM Rules/Calculators/A&P                       Final Clinical Impression(s) / ED Diagnoses Final diagnoses:  COVID-19    Rx / DC Orders ED Discharge Orders    None       Bethel Born, PA-C 11/29/19 1521    Derwood Kaplan, MD 11/30/19 [phone]

## 2019-11-29 ENCOUNTER — Telehealth: Payer: Self-pay | Admitting: Adult Health

## 2019-11-29 LAB — RAPID URINE DRUG SCREEN, HOSP PERFORMED
Amphetamines: NOT DETECTED
Barbiturates: NOT DETECTED
Benzodiazepines: NOT DETECTED
Cocaine: NOT DETECTED
Opiates: NOT DETECTED
Tetrahydrocannabinol: NOT DETECTED

## 2019-11-29 LAB — COMPREHENSIVE METABOLIC PANEL
ALT: 46 U/L — ABNORMAL HIGH (ref 0–44)
AST: 28 U/L (ref 15–41)
Albumin: 3.6 g/dL (ref 3.5–5.0)
Alkaline Phosphatase: 70 U/L (ref 38–126)
Anion gap: 10 (ref 5–15)
BUN: 9 mg/dL (ref 6–20)
CO2: 26 mmol/L (ref 22–32)
Calcium: 9.7 mg/dL (ref 8.9–10.3)
Chloride: 105 mmol/L (ref 98–111)
Creatinine, Ser: 0.87 mg/dL (ref 0.61–1.24)
GFR calc Af Amer: >60 mL/min (ref >60)
GFR calc non Af Amer: >60 mL/min (ref >60)
Glucose, Bld: 109 mg/dL — ABNORMAL HIGH (ref 70–99)
Potassium: 4.1 mmol/L (ref 3.5–5.1)
Sodium: 141 mmol/L (ref 135–145)
Total Bilirubin: 0.4 mg/dL (ref 0.3–1.2)
Total Protein: 6.3 g/dL — ABNORMAL LOW (ref 6.5–8.1)

## 2019-11-29 LAB — ETHANOL: Alcohol, Ethyl (B): <10 mg/dL (ref <10)

## 2019-11-29 NOTE — ED Notes (Signed)
[name], [name], [phone], is not yet his legal guardian. She has started the paperwork for the designation. Pt lives with her.

## 2019-11-29 NOTE — ED Notes (Signed)
Pt pleasant, compliant with nursing interventions.

## 2019-11-29 NOTE — Consult Note (Addendum)
[facility] Psych ED Progress Note  11/29/2019 10:32 AM Marvin Crawford  MRN:  [id number] Subjective:  Patient assessed by nurse practitioner along with Dr Jannifer Franklin. Patient alert and cooperative.  Patient reports "I would like to go home."  Patient continues to display bizarre behaviors including placing hands in front of pants inappropriately.  Patient denies suicidal and homicidal ideations. Patient denies auditory and visual hallucinations. Patient does not currently appear to be repsonding to internal stimuli.  Principal Problem: <principal problem not specified> Diagnosis:  Active Problems:   * No active hospital problems. *  Total Time spent with patient: 20 minutes  Past Psychiatric History: Major depressive disorder, with psychosis, cannabis use disorder, schizoaffective disorder, schizophrenia  Past Medical History:  Past Medical History:  Diagnosis Date  . ADD (attention deficit disorder)   . Anxiety   . Depression   . Environmental allergies   . Schizo-affective psychosis (HCC)    History reviewed. No pertinent surgical history. Family History:  Family History  Problem Relation Age of Onset  . Hypertension Mother   . Hypertension Father   . Mental illness Neg Hx    Family Psychiatric  History: Unknown Social History:  Social History   Substance and Sexual Activity  Alcohol Use Yes  . Alcohol/week: 1.0 standard drinks  . Types: 1 Standard drinks or equivalent per week   Comment: "once in a blue moon"     Social History   Substance and Sexual Activity  Drug Use Yes  . Types: Other-see comments, Marijuana   Comment: oil smoked in pipe calls "DAB", friends buy for him    Social History   Socioeconomic History  . Marital status: Single    Spouse name: Not on file  . Number of children: Not on file  . Years of education: Not on file  . Highest education level: Not on file  Occupational History  . Not on file  Tobacco Use  . Smoking status: Current Every Day  Smoker    Packs/day: 2.00    Start date: 02/05/2019  . Smokeless tobacco: Never Used  Substance and Sexual Activity  . Alcohol use: Yes    Alcohol/week: 1.0 standard drinks    Types: 1 Standard drinks or equivalent per week    Comment: "once in a blue moon"  . Drug use: Yes    Types: Other-see comments, Marijuana    Comment: oil smoked in pipe calls "DAB", friends buy for him  . Sexual activity: Yes    Birth control/protection: Condom  Other Topics Concern  . Not on file  Social History Narrative   Graduated from high school at age 68. He took IEP classes until he was in 10th grade. In 11th and 12th grade, he took normal classes and says he didn't need any extra help. He did 2 months of college at [employer], and this was difficult for him. He dropped out of college, due to difficulty balancing work and school.    Social Determinants of Health   Financial Resource Strain: Unknown  . Difficulty of Paying Living Expenses: Patient refused  Food Insecurity: Unknown  . Worried About Programme researcher, broadcasting/film/video in the Last Year: Patient refused  . Ran Out of Food in the Last Year: Patient refused  Transportation Needs: Unknown  . Lack of Transportation (Medical): Patient refused  . Lack of Transportation (Non-Medical): Patient refused  Physical Activity: Unknown  . Days of Exercise per Week: Patient refused  . Minutes of Exercise per Session: Patient  refused  Stress:   . Feeling of Stress :   Social Connections: Unknown  . Frequency of Communication with Friends and Family: Patient refused  . Frequency of Social Gatherings with Friends and Family: Patient refused  . Attends Religious Services: Patient refused  . Active Member of Clubs or Organizations: Patient refused  . Attends Banker Meetings: Patient refused  . Marital Status: Patient refused    Sleep: Fair  Appetite:  Fair  Current Medications: Current Facility-Administered Medications  Medication Dose Route Frequency  Provider Last Rate Last Admin  . amantadine (SYMMETREL) capsule 100 mg  100 mg Oral BID Bethel Born, PA-C   100 mg at 11/29/19 2683  . citalopram (CELEXA) tablet 20 mg  20 mg Oral Daily Bethel Born, PA-C   20 mg at 11/29/19 4196  . fluocinonide cream (LIDEX) 0.05 % 1 application  1 application Topical BID Bethel Born, PA-C   1 application at 11/29/19 2229  . hydrOXYzine (ATARAX/VISTARIL) tablet 25 mg  25 mg Oral TID PRN Bethel Born, PA-C      . paliperidone (INVEGA) 24 hr tablet 9 mg  9 mg Oral Daily Bethel Born, PA-C   9 mg at 11/29/19 7989   Current Outpatient Medications  Medication Sig Dispense Refill  . amantadine (SYMMETREL) 100 MG capsule Take 100 mg by mouth 2 (two) times daily.    . citalopram (CELEXA) 20 MG tablet Take 20 mg by mouth daily.    . fluocinonide cream (LIDEX) 0.05 % Apply 1 application topically 2 (two) times daily.    . hydrOXYzine (ATARAX/VISTARIL) 25 MG tablet Take 1 tablet (25 mg total) by mouth 3 (three) times daily as needed for anxiety. 75 tablet 0  . INVEGA SUSTENNA 156 MG/ML SUSY injection Inject 1 Syringe into the muscle every 30 (thirty) days.    . methotrexate (RHEUMATREX) 2.5 MG tablet Take 2.5 mg by mouth See admin instructions. 2.5mg  every 12 hours DO NOT exceed 7.5mg  per week. Filled 12 tablets on 11/06/2019 No refills    . paliperidone (INVEGA) 9 MG 24 hr tablet Take 9 mg by mouth daily.      Lab Results:  Results for orders placed or performed during the hospital encounter of 11/28/19 (from the past 48 hour(s))  Comprehensive metabolic panel     Status: Abnormal   Collection Time: 11/28/19 11:08 PM  Result Value Ref Range   Sodium 141 135 - 145 mmol/L   Potassium 4.1 3.5 - 5.1 mmol/L   Chloride 105 98 - 111 mmol/L   CO2 26 22 - 32 mmol/L   Glucose, Bld 109 (H) 70 - 99 mg/dL    Comment: Glucose reference range applies only to samples taken after fasting for at least 8 hours.   BUN 9 6 - 20 mg/dL   Creatinine,  Ser 2.11 0.61 - 1.24 mg/dL   Calcium 9.7 8.9 - 94.1 mg/dL   Total Protein 6.3 (L) 6.5 - 8.1 g/dL   Albumin 3.6 3.5 - 5.0 g/dL   AST 28 15 - 41 U/L   ALT 46 (H) 0 - 44 U/L   Alkaline Phosphatase 70 38 - 126 U/L   Total Bilirubin 0.4 0.3 - 1.2 mg/dL   GFR calc non Af Amer >60 >60 mL/min   GFR calc Af Amer >60 >60 mL/min   Anion gap 10 5 - 15    Comment: Performed at [facility], [address], [place], [place] [address]  Ethanol     Status: None   Collection Time: 11/28/19 11:08 PM  Result Value Ref Range   Alcohol, Ethyl (B) <10 <10 mg/dL    Comment: (NOTE) Lowest detectable limit for serum alcohol is 10 mg/dL. For medical purposes only. Performed at [facility], [address] [place], [place] [address]   CBC with Diff     Status: Abnormal   Collection Time: 11/28/19 11:08 PM  Result Value Ref Range   WBC 6.0 4.0 - 10.5 K/uL   RBC 4.19 (L) 4.22 - 5.81 MIL/uL   Hemoglobin 11.9 (L) 13.0 - 17.0 g/dL   HCT 35.0 (L) 09.3 - 81.8 %   MCV 87.1 80.0 - 100.0 fL   MCH 28.4 26.0 - 34.0 pg   MCHC 32.6 30.0 - 36.0 g/dL   RDW 29.9 37.1 - 69.6 %   Platelets 255 150 - 400 K/uL   nRBC 0.0 0.0 - 0.2 %   Neutrophils Relative % 47 %   Neutro Abs 2.9 1.7 - 7.7 K/uL   Lymphocytes Relative 40 %   Lymphs Abs 2.4 0.7 - 4.0 K/uL   Monocytes Relative 7 %   Monocytes Absolute 0.4 0.1 - 1.0 K/uL   Eosinophils Relative 5 %   Eosinophils Absolute 0.3 0.0 - 0.5 K/uL   Basophils Relative 1 %   Basophils Absolute 0.0 0.0 - 0.1 K/uL   Immature Granulocytes 0 %   Abs Immature Granulocytes 0.01 0.00 - 0.07 K/uL    Comment: Performed at [facility], [address] [place], [place] [address]  Urine rapid drug screen (hosp performed)     Status: None   Collection Time: 11/29/19  9:29 AM  Result Value Ref Range   Opiates NONE DETECTED NONE DETECTED   Cocaine NONE DETECTED NONE DETECTED   Benzodiazepines NONE DETECTED NONE DETECTED    Amphetamines NONE DETECTED NONE DETECTED   Tetrahydrocannabinol NONE DETECTED NONE DETECTED   Barbiturates NONE DETECTED NONE DETECTED    Comment: (NOTE) DRUG SCREEN FOR MEDICAL PURPOSES ONLY.  IF CONFIRMATION IS NEEDED FOR ANY PURPOSE, NOTIFY LAB WITHIN 5 DAYS. LOWEST DETECTABLE LIMITS FOR URINE DRUG SCREEN Drug Class                     Cutoff (ng/mL) Amphetamine and metabolites    1000 Barbiturate and metabolites    200 Benzodiazepine                 200 Tricyclics and metabolites     300 Opiates and metabolites        300 Cocaine and metabolites        300 THC                            50 Performed at [facility], [address], [place], [place] [address]     Blood Alcohol level:  Lab Results  Component Value Date   ETH <10 11/28/2019   ETH <10 10/12/2018    Physical Findings: AIMS:  , ,  ,  ,    CIWA:    COWS:     Musculoskeletal: Strength & Muscle Tone: within normal limits Gait & Station: normal Patient leans: N/A  Psychiatric Specialty Exam: Physical Exam Vitals and nursing note reviewed.  Constitutional:      Appearance: He is well-developed.  HENT:     Head: Normocephalic.  Cardiovascular:  Rate and Rhythm: Normal rate.  Pulmonary:     Effort: Pulmonary effort is normal.  Musculoskeletal:        General: Normal range of motion.     Cervical back: Normal range of motion.  Neurological:     Mental Status: He is alert and oriented to person, place, and time.     Review of Systems  Blood pressure 129/89, pulse 86, temperature 98.1 F (36.7 C), temperature source Oral, resp. rate 18, SpO2 100 %.There is no height or weight on file to calculate BMI.  General Appearance: Casual  Eye Contact:  Fair  Speech:  Clear and Coherent and Normal Rate  Volume:  Normal  Mood:  Anxious  Affect:  Congruent  Thought Process:  Coherent, Goal Directed and Descriptions of Associations: Loose  Orientation:  Full (Time, Place, and Person)   Thought Content:  Tangential  Suicidal Thoughts:  No  Homicidal Thoughts:  No  Memory:  Immediate;   Good Recent;   Good Remote;   Good  Judgement:  Fair  Insight:  Lacking  Psychomotor Activity:  Normal  Concentration:  Concentration: Fair  Recall:  Good  Fund of Knowledge:  Good  Language:  Good  Akathisia:  No  Handed:  Right  AIMS (if indicated):     Assets:  Communication Skills Desire for Improvement Financial Resources/Insurance Housing Intimacy Leisure Time Physical Health Resilience  ADL's:  Intact  Cognition:  WNL  Sleep:         Treatment Plan Summary: Daily contact with patient to assess and evaluate symptoms and progress in treatment  Continue to recommend inpatient psychiatric treatment. Continue medications as patient is currently COVID positive and may be challenging to place in inpatient psychiatric facility.   Emmaline Kluver, [place] 11/29/2019, 10:32 AM  Patient seen face-to-face for psychiatric evaluation, chart reviewed and case discussed with the physician extender and developed treatment plan. Reviewed the information documented and agree with the treatment plan. Corena Pilgrim, MD

## 2019-11-29 NOTE — Telephone Encounter (Signed)
Sent Griffin Dakin RN note through chat about patient Marvin Crawford, as he has tested positive for COVID and may meet criteria for monoclonal antibody therapy with Bamlanivimab combo.  I asked that she send the info to his ED APP/MD team so they are aware.    Lillard Anes, NP

## 2019-11-29 NOTE — ED Notes (Signed)
Pt took all of his clothes. When asked why he said, "Because I pissed all over myself."  Pt given clean clothes. He did not put them on. Pt given snack.

## 2019-11-29 NOTE — ED Notes (Signed)
This Clinical research associate communicated to Loa Socks NP through chat that Benjiman Core is the EDP for Psych patients today and requested that she communicate her message about the management of Mr Bechtol's Covid care with Dr. Rubin Payor.

## 2019-11-29 NOTE — ED Notes (Signed)
Marvin Crawford from [facility] called to deny placement in their facility r/t Covid 19.

## 2019-11-29 NOTE — ED Provider Notes (Addendum)
  Physical Exam  BP 129/89 (BP Location: Left Arm)   Pulse 86   Temp 98.1 F (36.7 C) (Oral)   Resp 18   SpO2 100%   Physical Exam  ED Course/Procedures     Procedures  MDM  Patient with positive Covid test.  Had been started back on Invega sustain a. We have been contacted about monoclonal antibody infusion since he would be high risk on the methotrexate.  However methotrexate had been stopped because they did not know why he was on it.  However he does not have symptoms.  Psychiatry is hoping patient's mental status will continue to improve and he will be able to be discharged.       Benjiman Core, MD 11/29/19 1112  Patient discharged by behavioral health.   Benjiman Core, MD 11/30/19 1252

## 2019-11-29 NOTE — ED Notes (Signed)
RN went to MD to clarify Flucinonide and methotrexate. MD will follow up regarding this.

## 2019-11-29 NOTE — ED Notes (Signed)
Patient received snack.  

## 2019-11-29 NOTE — Progress Notes (Signed)
Patient meets criteria for inpatient treatment per Nanine Means, NP. No appropriate beds at [facility] currently. CSW faxed referrals to the following facilities for review:  [facility]   [facility]   [facility]   [facility]   [facility]  [facility]  [facility]  [facility]   [facility]   [facility]  [facility]  [facility]   [facility]  TTS will continue to seek bed placement.     Ruthann Cancer MSW, [facility] Social Worker Disposition  [facility] Ph: [phone] Fax: [phone] 11/29/2019 8:41 AM

## 2019-11-30 NOTE — Consult Note (Addendum)
[facility] Psych ED Discharge  11/30/2019 11:22 AM Marvin Crawford  MRN:  [id number] Principal Problem: <principal problem not specified> Discharge Diagnoses: Active Problems:   * No active hospital problems. *   Subjective: Patient seen by nurse practitioner along with Dr. Jannifer Franklin.  Patient alert and oriented, answers appropriately. Patient states "everything is all right." Patient denies suicidal and homicidal ideations.  Patient denies auditory and visual hallucinations.  Patient denies symptoms of paranoia.  Patient does not appear to be responding to internal stimuli. Patient reports "I slept good and my appetite is good." Patient reports plan to return home to his mother's home. Patient's behavior has been cooperative and appropriate, patient compliant with p.o. medications at this time.   Total Time spent with patient: 30 minutes  Past Psychiatric History: Schizoaffective disorder, major depressive disorder with psychotic features, schizophrenia, cannabis use disorder, borderline intellectual functioning  Past Medical History:  Past Medical History:  Diagnosis Date  . ADD (attention deficit disorder)   . Anxiety   . Depression   . Environmental allergies   . Schizo-affective psychosis (HCC)    History reviewed. No pertinent surgical history. Family History:  Family History  Problem Relation Age of Onset  . Hypertension Mother   . Hypertension Father   . Mental illness Neg Hx    Family Psychiatric  History: Unknown Social History:  Social History   Substance and Sexual Activity  Alcohol Use Yes  . Alcohol/week: 1.0 standard drinks  . Types: 1 Standard drinks or equivalent per week   Comment: "once in a blue moon"     Social History   Substance and Sexual Activity  Drug Use Yes  . Types: Other-see comments, Marijuana   Comment: oil smoked in pipe calls "DAB", friends buy for him    Social History   Socioeconomic History  . Marital status: Single    Spouse name:  Not on file  . Number of children: Not on file  . Years of education: Not on file  . Highest education level: Not on file  Occupational History  . Not on file  Tobacco Use  . Smoking status: Current Every Day Smoker    Packs/day: 2.00    Start date: 02/05/2019  . Smokeless tobacco: Never Used  Substance and Sexual Activity  . Alcohol use: Yes    Alcohol/week: 1.0 standard drinks    Types: 1 Standard drinks or equivalent per week    Comment: "once in a blue moon"  . Drug use: Yes    Types: Other-see comments, Marijuana    Comment: oil smoked in pipe calls "DAB", friends buy for him  . Sexual activity: Yes    Birth control/protection: Condom  Other Topics Concern  . Not on file  Social History Narrative   Graduated from high school at age 82. He took IEP classes until he was in 10th grade. In 11th and 12th grade, he took normal classes and says he didn't need any extra help. He did 2 months of college at [employer], and this was difficult for him. He dropped out of college, due to difficulty balancing work and school.    Social Determinants of Health   Financial Resource Strain: Unknown  . Difficulty of Paying Living Expenses: Patient refused  Food Insecurity: Unknown  . Worried About Programme researcher, broadcasting/film/video in the Last Year: Patient refused  . Ran Out of Food in the Last Year: Patient refused  Transportation Needs: Unknown  . Lack of Transportation (Medical): Patient  refused  . Lack of Transportation (Non-Medical): Patient refused  Physical Activity: Unknown  . Days of Exercise per Week: Patient refused  . Minutes of Exercise per Session: Patient refused  Stress:   . Feeling of Stress :   Social Connections: Unknown  . Frequency of Communication with Friends and Family: Patient refused  . Frequency of Social Gatherings with Friends and Family: Patient refused  . Attends Religious Services: Patient refused  . Active Member of Clubs or Organizations: Patient refused  . Attends Theatre manager Meetings: Patient refused  . Marital Status: Patient refused    Has this patient used any form of tobacco in the last 30 days? (Cigarettes, Smokeless Tobacco, Cigars, and/or Pipes) A prescription for an FDA-approved tobacco cessation medication was offered at discharge and the patient refused  Current Medications: Current Facility-Administered Medications  Medication Dose Route Frequency Provider Last Rate Last Admin  . amantadine (SYMMETREL) capsule 100 mg  100 mg Oral BID Recardo Evangelist, PA-C   100 mg at 11/30/19 1100  . citalopram (CELEXA) tablet 20 mg  20 mg Oral Daily Recardo Evangelist, PA-C   20 mg at 11/30/19 1100  . fluocinonide cream (LIDEX) 7.10 % 1 application  1 application Topical BID Recardo Evangelist, PA-C   1 application at 62/69/48 1100  . hydrOXYzine (ATARAX/VISTARIL) tablet 25 mg  25 mg Oral TID PRN Recardo Evangelist, PA-C   25 mg at 11/30/19 1108  . paliperidone (INVEGA) 24 hr tablet 9 mg  9 mg Oral Daily Recardo Evangelist, PA-C   9 mg at 11/30/19 1100   Current Outpatient Medications  Medication Sig Dispense Refill  . amantadine (SYMMETREL) 100 MG capsule Take 100 mg by mouth 2 (two) times daily.    . citalopram (CELEXA) 20 MG tablet Take 20 mg by mouth daily.    . fluocinonide cream (LIDEX) 5.46 % Apply 1 application topically 2 (two) times daily.    . hydrOXYzine (ATARAX/VISTARIL) 25 MG tablet Take 1 tablet (25 mg total) by mouth 3 (three) times daily as needed for anxiety. 75 tablet 0  . INVEGA SUSTENNA 156 MG/ML SUSY injection Inject 1 Syringe into the muscle every 30 (thirty) days.    . methotrexate (RHEUMATREX) 2.5 MG tablet Take 2.5 mg by mouth See admin instructions. 2.5mg  every 12 hours DO NOT exceed 7.5mg  per week. Filled 12 tablets on 11/06/2019 No refills    . paliperidone (INVEGA) 9 MG 24 hr tablet Take 9 mg by mouth daily.     PTA Medications: (Not in a hospital admission)   Musculoskeletal: Strength & Muscle Tone: within normal  limits Gait & Station: normal Patient leans: N/A  Psychiatric Specialty Exam: Physical Exam Vitals and nursing note reviewed.  Constitutional:      Appearance: He is well-developed.  HENT:     Head: Normocephalic.  Cardiovascular:     Rate and Rhythm: Normal rate.  Pulmonary:     Effort: Pulmonary effort is normal.  Neurological:     Mental Status: He is alert and oriented to person, place, and time.  Psychiatric:        Attention and Perception: Attention normal.        Mood and Affect: Mood normal.        Speech: Speech normal.        Behavior: Behavior normal. Behavior is cooperative.        Thought Content: Thought content normal.        Cognition and Memory:  Cognition is impaired.        Judgment: Judgment normal.     Review of Systems  Constitutional: Negative.   HENT: Negative.   Eyes: Negative.   Respiratory: Negative.   Cardiovascular: Negative.   Gastrointestinal: Negative.   Genitourinary: Negative.   Musculoskeletal: Negative.   Skin: Negative.   Neurological: Negative.   Psychiatric/Behavioral: Negative.     Blood pressure 126/79, pulse 70, temperature 98.2 F (36.8 C), temperature source Oral, resp. rate 18, SpO2 100 %.There is no height or weight on file to calculate BMI.  General Appearance: Casual and Fairly Groomed  Eye Contact:  Good  Speech:  Clear and Coherent and Normal Rate  Volume:  Normal  Mood:  Euthymic  Affect:  Appropriate and Congruent  Thought Process:  Coherent, Goal Directed and Descriptions of Associations: Intact  Orientation:  Full (Time, Place, and Person)  Thought Content:  WDL and Logical  Suicidal Thoughts:  No  Homicidal Thoughts:  No  Memory:  Immediate;   Good Recent;   Good Remote;   Good  Judgement:  Fair  Insight:  Lacking  Psychomotor Activity:  Normal  Concentration:  Concentration: Good and Attention Span: Good  Recall:  Good  Fund of Knowledge:  Fair  Language:  Fair  Akathisia:  No  Handed:  Right   AIMS (if indicated):     Assets:  Communication Skills Desire for Improvement Financial Resources/Insurance Housing Intimacy Leisure Time Physical Health Resilience Social Support Talents/Skills  ADL's:  Intact  Cognition:  Impaired,  Mild  Sleep:        Demographic Factors:  Male and Adolescent or young adult  Loss Factors: NA  Historical Factors: NA  Risk Reduction Factors:   Living with another person, especially a relative, Positive social support, Positive therapeutic relationship and Positive coping skills or problem solving skills  Continued Clinical Symptoms:    Cognitive Features That Contribute To Risk:  None    Suicide Risk:  Minimal: No identifiable suicidal ideation.  Patients presenting with no risk factors but with morbid ruminations; may be classified as minimal risk based on the severity of the depressive symptoms    Plan Of Care/Follow-up recommendations:  Social work telephone patient's mother to advise of discharge today and arrange transportation home. Other:  Follow-up with established outpatient psychiatry provider.  Disposition: Discharge to care of mother Marvin Dolly, FNP 11/30/2019, 11:22 AM  Patient seen face-to-face for psychiatric evaluation, chart reviewed and case discussed with the physician extender and developed treatment plan. Reviewed the information documented and agree with the treatment plan. Thedore Mins, MD

## 2019-11-30 NOTE — Discharge Instructions (Signed)
10 Things You Can Do to Manage Your COVID-19 Symptoms at Home If you have possible or confirmed COVID-19: 1. Stay home from work and school. And stay away from other public places. If you must go out, avoid using any kind of public transportation, ridesharing, or taxis. 2. Monitor your symptoms carefully. If your symptoms get worse, call your healthcare provider immediately. 3. Get rest and stay hydrated. 4. If you have a medical appointment, call the healthcare provider ahead of time and tell them that you have or may have COVID-19. 5. For medical emergencies, call 911 and notify the dispatch personnel that you have or may have COVID-19. 6. Cover your cough and sneezes with a tissue or use the inside of your elbow. 7. Wash your hands often with soap and water for at least 20 seconds or clean your hands with an alcohol-based hand sanitizer that contains at least 60% alcohol. 8. As much as possible, stay in a specific room and away from other people in your home. Also, you should use a separate bathroom, if available. If you need to be around other people in or outside of the home, wear a mask. 9. Avoid sharing personal items with other people in your household, like dishes, towels, and bedding. 10. Clean all surfaces that are touched often, like counters, tabletops, and doorknobs. Use household cleaning sprays or wipes according to the label instructions. cdc.gov/coronavirus 02/05/2019 This information is not intended to replace advice given to you by your health care provider. Make sure you discuss any questions you have with your health care provider. Document Revised: 07/10/2019 Document Reviewed: 07/10/2019 Elsevier Patient Education  2020 Elsevier Inc.  

## 2019-11-30 NOTE — Progress Notes (Signed)
11/30/2019  1205  Called SW [phone] Left message regarding if IVC has been cancelled. Waiting for call back from SW. Notified psych NP regarding cancelling the IVC also, she states SW is working on [employer].

## 2019-11-30 NOTE — Progress Notes (Signed)
CSW spoke with pt's mother [name] [phone]) and provided disposition information. [name] stated she would be able to pick pt up from [facility] ED at 12:30pm on this date.    Ruthann Cancer MSW, LCSWA Clincal Social Worker Disposition  [facility] Ph: [phone] Fax: [phone]

## 2019-12-11 ENCOUNTER — Other Ambulatory Visit: Payer: Self-pay | Admitting: Family

## 2019-12-12 IMAGING — CT CT MAXILLOFACIAL WITHOUT CONTRAST
3 series · 16 of 47 positions shown, 19 images · non-contrast
Comparison: None.

CLINICAL DATA: Dentofacial anomalies. Wisdom teeth removed
approximately 1 month ago. Mandibular swelling and locking.

EXAM:
CT MAXILLOFACIAL WITHOUT CONTRAST
TECHNIQUE: Multidetector CT imaging of the maxillofacial structures was
performed. Multiplanar CT image reconstructions were also generated.

[Series 3: max soft · axial · 0.36mm/px · z∈[-230,-68]mm · 10 of 95 slices shown, 13 images]
[im 7/95  brain]
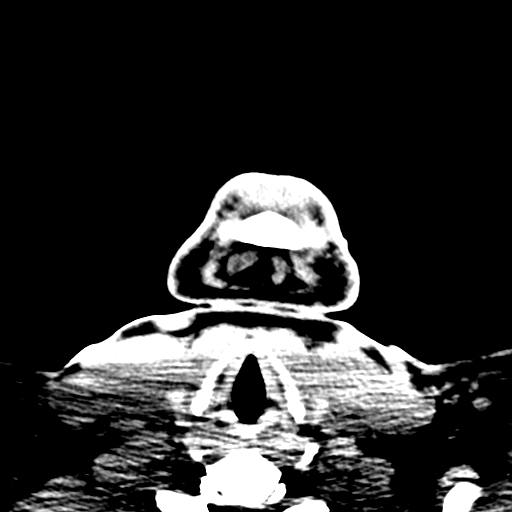
[im 7/95  bone]
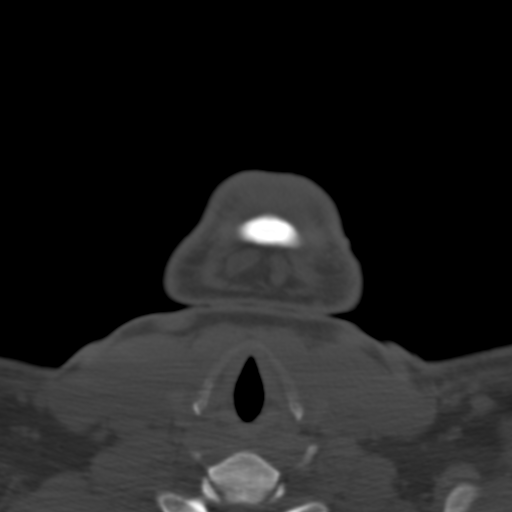
[im 17/95  bone]
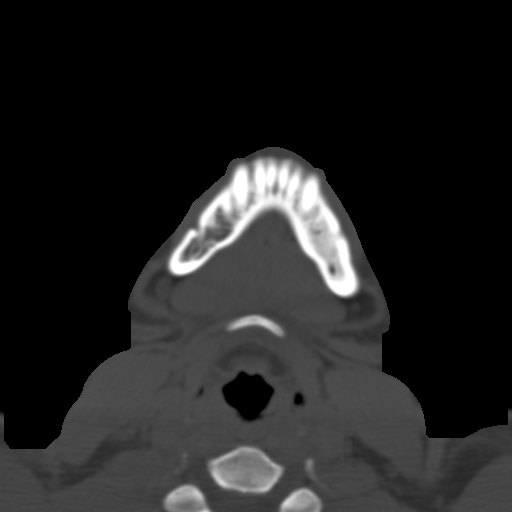
[im 26/95  bone]
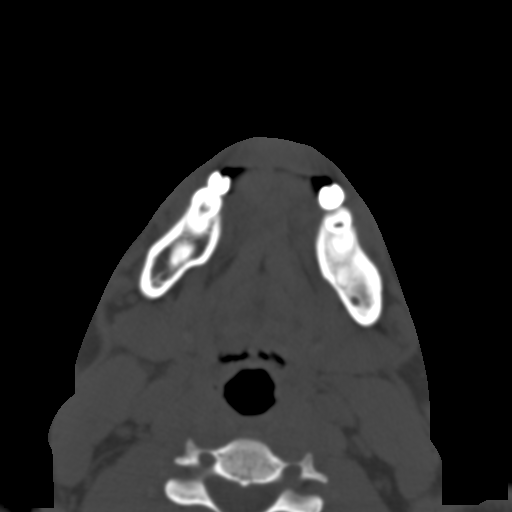
[im 33/95  bone]
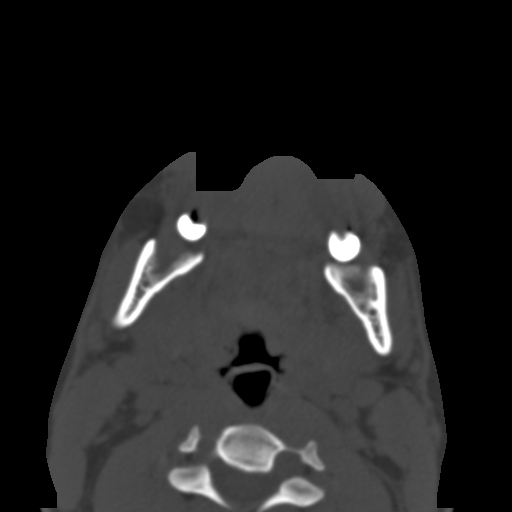
[im 43/95  brain]
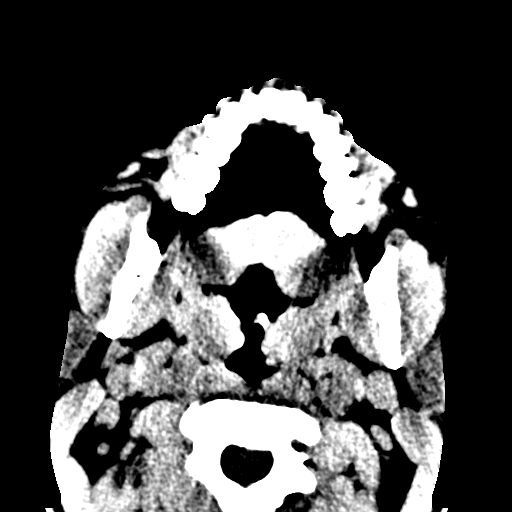
[im 43/95  bone]
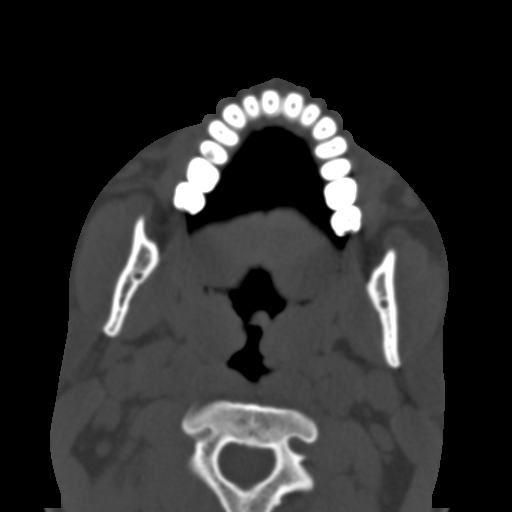
[im 52/95  bone]
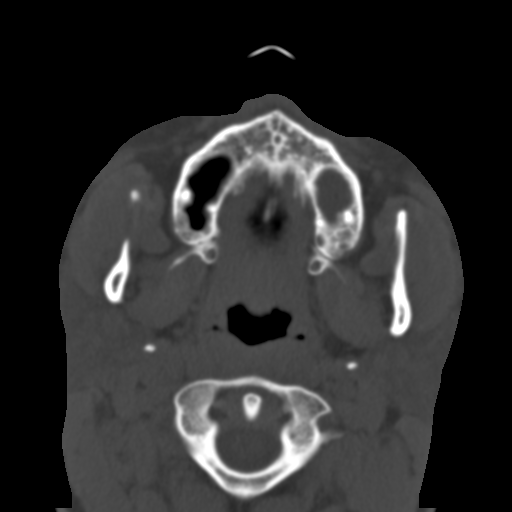
[im 62/95  bone]
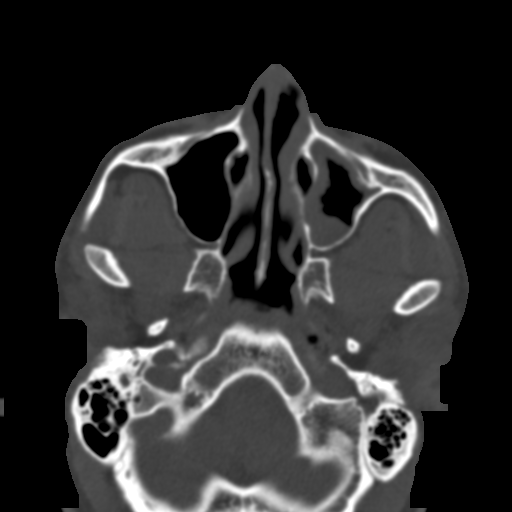
[im 72/95  bone]
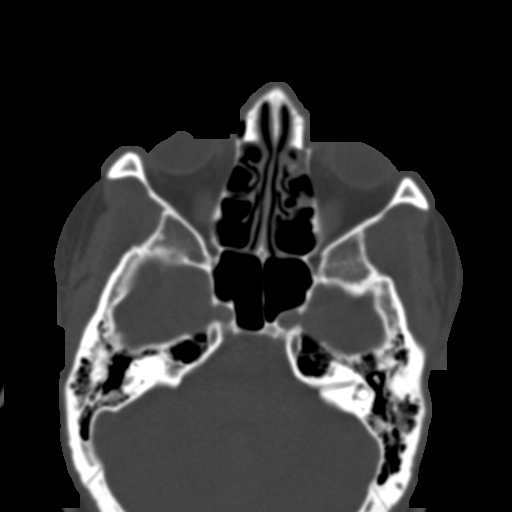
[im 78/95  brain]
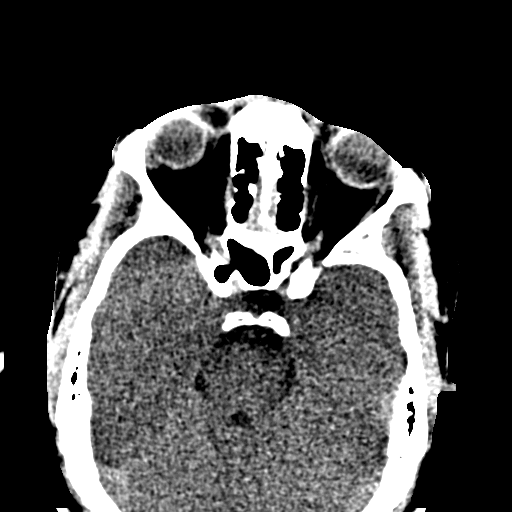
[im 78/95  bone]
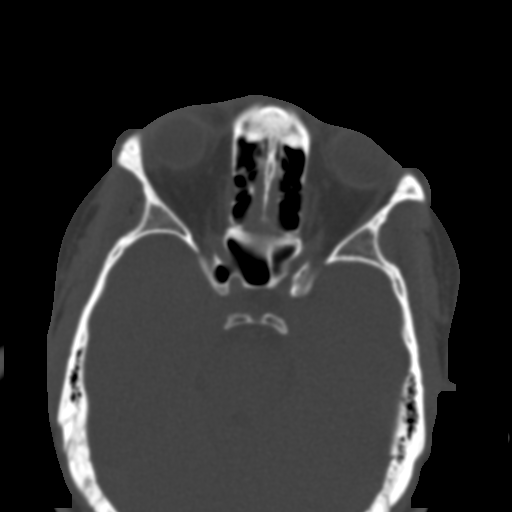
[im 88/95  bone]
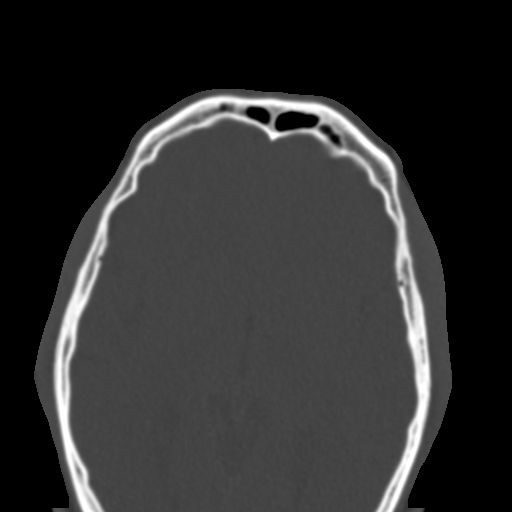

[Series 7: coronal soft · coronal · 0.43mm/px · 3 of 78 slices shown]
[im 26/78  bone]
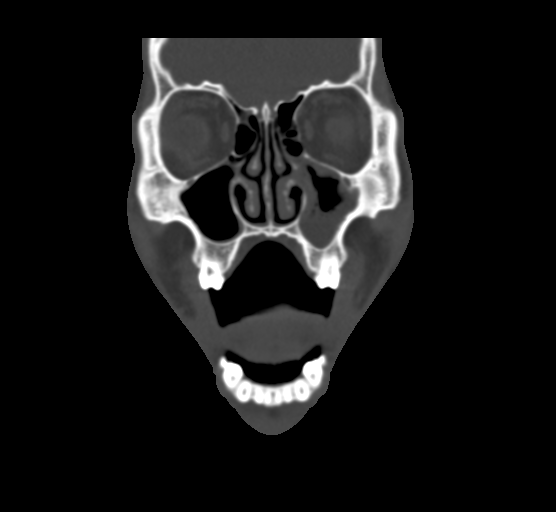
[im 35/78  bone]
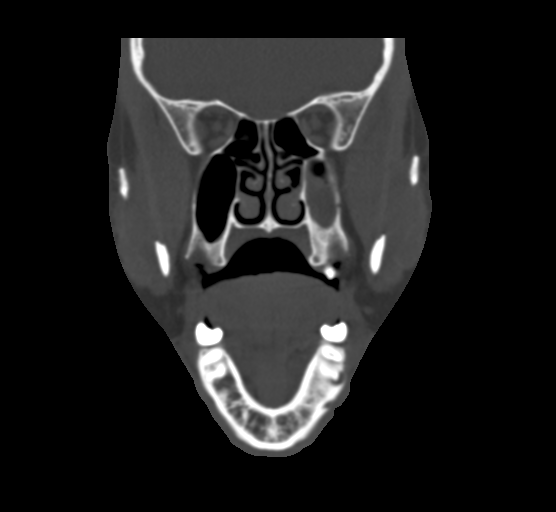
[im 43/78  bone]
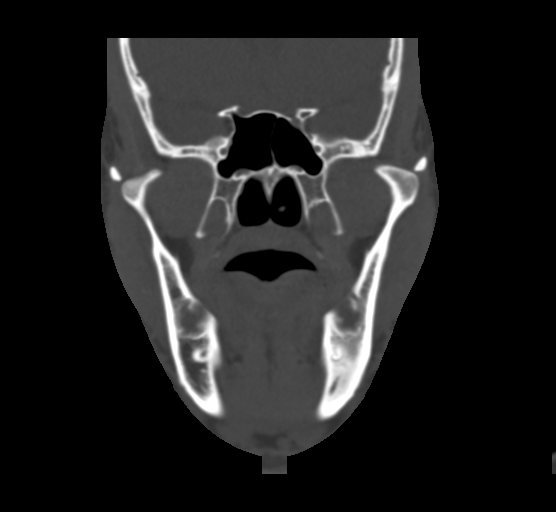

[Series 8: sagittal soft · sagittal · 0.37mm/px · 3 of 86 slices shown]
[im 29/86  bone]
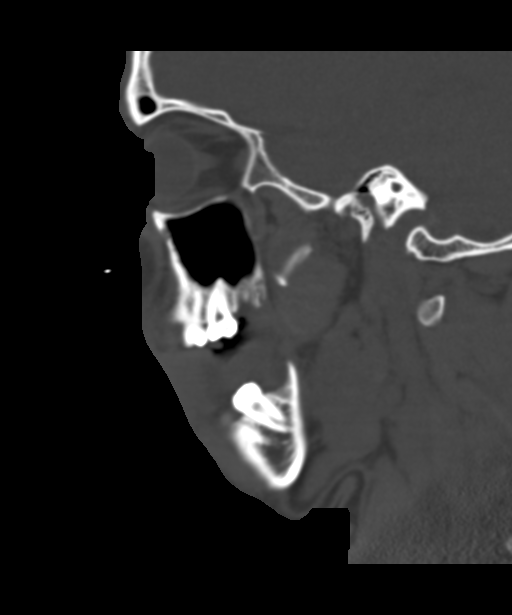
[im 43/86  bone]
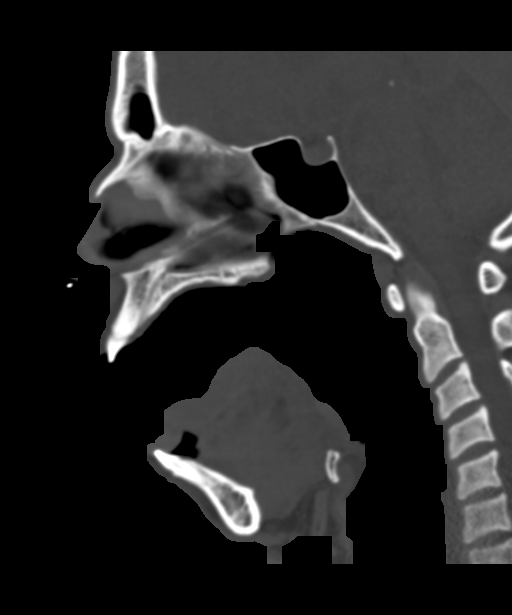
[im 57/86  bone]
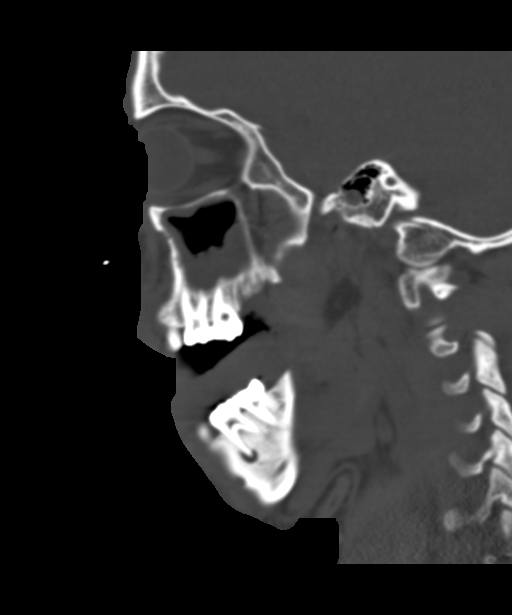

[16 of 47 positions shown; findings below may reference images not displayed]

FINDINGS: Osseous: Both mandibular condyles are located completely the
anterior to the temporal fossa.

Bone defects related 2 bilateral wisdom teeth removal. No specific
complicating feature related to those. The patient does have lucency
around the roots tooth 19 and a lesser extent tooth 20. Small
periapical abscesses at tooth 19. No other facial region bone
finding.

Orbits: Normal

Sinuses: Left maxillary sinusitis.  Other sinuses are clear.

Soft tissues: No significant soft tissue finding. No evidence of
facial abscess.

Limited intracranial: Normal
IMPRESSION: Complete anterior dislocation of the mandibular condyles relative to
their respective temporal fossa.

Recent wisdom tooth extraction without unexpected finding in those
regions.

Periodontal disease of tooth 19 and to a lesser extent tooth 20.

Left maxillary sinusitis per

## 2019-12-29 ENCOUNTER — Emergency Department (HOSPITAL_COMMUNITY)
Admission: EM | Admit: 2019-12-29 | Discharge: 2020-01-01 | Disposition: A | Discharge status: Home / Self Care | Payer: Medicaid Other | Attending: Emergency Medicine | Admitting: Emergency Medicine

## 2019-12-29 ENCOUNTER — Other Ambulatory Visit: Payer: Self-pay

## 2019-12-29 ENCOUNTER — Observation Stay (HOSPITAL_COMMUNITY)
Admission: RE | Admit: 2019-12-29 | Discharge: 2019-12-29 | Disposition: A | Discharge status: Home / Self Care | Payer: Medicaid Other | Attending: Psychiatry | Admitting: Psychiatry

## 2019-12-29 ENCOUNTER — Encounter (HOSPITAL_COMMUNITY): Payer: Self-pay

## 2019-12-29 DIAGNOSIS — R4689 Other symptoms and signs involving appearance and behavior: Secondary | ICD-10-CM | POA: Insufficient documentation

## 2019-12-29 DIAGNOSIS — F909 Attention-deficit hyperactivity disorder, unspecified type: Secondary | ICD-10-CM | POA: Insufficient documentation

## 2019-12-29 DIAGNOSIS — Z20822 Contact with and (suspected) exposure to covid-19: Secondary | ICD-10-CM | POA: Diagnosis not present

## 2019-12-29 DIAGNOSIS — F17210 Nicotine dependence, cigarettes, uncomplicated: Secondary | ICD-10-CM | POA: Insufficient documentation

## 2019-12-29 DIAGNOSIS — Z79899 Other long term (current) drug therapy: Secondary | ICD-10-CM | POA: Diagnosis not present

## 2019-12-29 DIAGNOSIS — F320 Major depressive disorder, single episode, mild: Secondary | ICD-10-CM | POA: Diagnosis not present

## 2019-12-29 DIAGNOSIS — F419 Anxiety disorder, unspecified: Secondary | ICD-10-CM | POA: Diagnosis not present

## 2019-12-29 DIAGNOSIS — Z91010 Allergy to peanuts: Secondary | ICD-10-CM | POA: Insufficient documentation

## 2019-12-29 DIAGNOSIS — Z8659 Personal history of other mental and behavioral disorders: Secondary | ICD-10-CM

## 2019-12-29 DIAGNOSIS — F209 Schizophrenia, unspecified: Principal | ICD-10-CM | POA: Insufficient documentation

## 2019-12-29 DIAGNOSIS — F329 Major depressive disorder, single episode, unspecified: Secondary | ICD-10-CM | POA: Diagnosis not present

## 2019-12-29 DIAGNOSIS — F333 Major depressive disorder, recurrent, severe with psychotic symptoms: Secondary | ICD-10-CM | POA: Diagnosis present

## 2019-12-29 DIAGNOSIS — F251 Schizoaffective disorder, depressive type: Secondary | ICD-10-CM | POA: Diagnosis present

## 2019-12-29 DIAGNOSIS — R4183 Borderline intellectual functioning: Secondary | ICD-10-CM

## 2019-12-29 LAB — CBC WITH DIFFERENTIAL/PLATELET
Abs Immature Granulocytes: 0.01 10*3/uL (ref 0.00–0.07)
Basophils Absolute: 0 10*3/uL (ref 0.0–0.1)
Basophils Relative: 1 %
Eosinophils Absolute: 0.3 10*3/uL (ref 0.0–0.5)
Eosinophils Relative: 5 %
HCT: 37.8 % — ABNORMAL LOW (ref 39.0–52.0)
Hemoglobin: 13 g/dL (ref 13.0–17.0)
Immature Granulocytes: 0 %
Lymphocytes Relative: 39 %
Lymphs Abs: 2.6 10*3/uL (ref 0.7–4.0)
MCH: 29.4 pg (ref 26.0–34.0)
MCHC: 34.4 g/dL (ref 30.0–36.0)
MCV: 85.5 fL (ref 80.0–100.0)
Monocytes Absolute: 0.5 10*3/uL (ref 0.1–1.0)
Monocytes Relative: 8 %
Neutro Abs: 3.1 10*3/uL (ref 1.7–7.7)
Neutrophils Relative %: 47 %
Platelets: 259 10*3/uL (ref 150–400)
RBC: 4.42 MIL/uL (ref 4.22–5.81)
RDW: 13.3 % (ref 11.5–15.5)
WBC: 6.6 10*3/uL (ref 4.0–10.5)
nRBC: 0 % (ref 0.0–0.2)

## 2019-12-29 LAB — COMPREHENSIVE METABOLIC PANEL
ALT: 49 U/L — ABNORMAL HIGH (ref 0–44)
AST: 41 U/L (ref 15–41)
Albumin: 4 g/dL (ref 3.5–5.0)
Alkaline Phosphatase: 65 U/L (ref 38–126)
Anion gap: 8 (ref 5–15)
BUN: 12 mg/dL (ref 6–20)
CO2: 26 mmol/L (ref 22–32)
Calcium: 9.5 mg/dL (ref 8.9–10.3)
Chloride: 103 mmol/L (ref 98–111)
Creatinine, Ser: 0.81 mg/dL (ref 0.61–1.24)
GFR calc Af Amer: >60 mL/min (ref >60)
GFR calc non Af Amer: >60 mL/min (ref >60)
Glucose, Bld: 93 mg/dL (ref 70–99)
Potassium: 3.8 mmol/L (ref 3.5–5.1)
Sodium: 137 mmol/L (ref 135–145)
Total Bilirubin: 0.5 mg/dL (ref 0.3–1.2)
Total Protein: 6.8 g/dL (ref 6.5–8.1)

## 2019-12-29 LAB — RAPID URINE DRUG SCREEN, HOSP PERFORMED
Amphetamines: NOT DETECTED
Barbiturates: NOT DETECTED
Benzodiazepines: NOT DETECTED
Cocaine: NOT DETECTED
Opiates: NOT DETECTED
Tetrahydrocannabinol: NOT DETECTED

## 2019-12-29 LAB — ACETAMINOPHEN LEVEL: Acetaminophen (Tylenol), Serum: <10 ug/mL — ABNORMAL LOW (ref 10–30)

## 2019-12-29 LAB — SALICYLATE LEVEL: Salicylate Lvl: <7 mg/dL — ABNORMAL LOW (ref 7.0–30.0)

## 2019-12-29 LAB — ETHANOL: Alcohol, Ethyl (B): <10 mg/dL (ref <10)

## 2019-12-29 MED ORDER — STERILE WATER FOR INJECTION IJ SOLN
INTRAMUSCULAR | Status: AC
  Administered 2019-12-29: 1.2 mL
  Filled 2019-12-29: qty 10

## 2019-12-29 MED ORDER — ZIPRASIDONE MESYLATE 20 MG IM SOLR
20.0000 mg | Freq: Once | INTRAMUSCULAR | Status: AC
  Administered 2019-12-29: 20 mg via INTRAMUSCULAR
  Filled 2019-12-29: qty 20
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
## 2019-12-29 NOTE — ED Triage Notes (Signed)
Pt brought to ER for medical clearance by [facility] Pt not answering questions in triage just laughing at them.

## 2019-12-30 LAB — SARS CORONAVIRUS 2 BY RT PCR (HOSPITAL ORDER, PERFORMED IN ~~LOC~~ HOSPITAL LAB): SARS Coronavirus 2: NEGATIVE

## 2019-12-30 MED ORDER — TRAZODONE HCL 100 MG PO TABS: 100.0000 mg | ORAL_TABLET | Freq: Every evening | ORAL | Status: DC | PRN

## 2019-12-30 MED ORDER — PALIPERIDONE PALMITATE ER 156 MG/ML IM SUSY: 1.0000 | PREFILLED_SYRINGE | INTRAMUSCULAR | Status: DC

## 2019-12-30 MED ORDER — DIVALPROEX SODIUM ER 500 MG PO TB24
500.0000 mg | ORAL_TABLET | Freq: Every day | ORAL | Status: DC
  Administered 2019-12-31 – 2020-01-01 (×2): 500 mg via ORAL
  Filled 2019-12-30 (×2): qty 1

## 2019-12-30 MED ORDER — PALIPERIDONE PALMITATE ER 234 MG/1.5ML IM SUSY
234.0000 mg | PREFILLED_SYRINGE | INTRAMUSCULAR | Status: DC
  Filled 2019-12-30: qty 1.5

## 2019-12-30 MED ORDER — ESCITALOPRAM OXALATE 10 MG PO TABS
10.0000 mg | ORAL_TABLET | Freq: Every day | ORAL | Status: DC
  Administered 2019-12-31 – 2020-01-01 (×2): 10 mg via ORAL
  Filled 2019-12-30 (×2): qty 1

## 2019-12-30 MED ORDER — ZIPRASIDONE MESYLATE 20 MG IM SOLR
20.0000 mg | Freq: Two times a day (BID) | INTRAMUSCULAR | Status: DC | PRN
  Administered 2019-12-30 – 2019-12-31 (×2): 20 mg via INTRAMUSCULAR
  Filled 2019-12-30 (×2): qty 20

## 2019-12-30 MED ORDER — LORAZEPAM 1 MG PO TABS
2.0000 mg | ORAL_TABLET | Freq: Four times a day (QID) | ORAL | Status: DC | PRN
  Administered 2019-12-31: 2 mg via ORAL
  Filled 2019-12-30: qty 2

## 2019-12-30 MED ORDER — LORAZEPAM 2 MG/ML IJ SOLN
2.0000 mg | Freq: Four times a day (QID) | INTRAMUSCULAR | Status: DC | PRN
  Administered 2019-12-30: 2 mg via INTRAMUSCULAR
  Filled 2019-12-30: qty 1
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
## 2019-12-30 NOTE — ED Notes (Signed)
Patient attempted to run to exit door multiple times, easily redirected the first few times after catching up with patient, progressed to having to be physically restrained in order to keep patient from exiting through EMS bay.

## 2019-12-30 NOTE — ED Notes (Signed)
Pt is selectively non-verbal.  He occasionally runs through the hallway but doesn't cause any trouble.  He went down one hall earlier and evidently pooped in the sink.  He then went back to bed

## 2019-12-30 NOTE — Progress Notes (Addendum)
Contacted [facility] to confirm medications and dosing. Patient is prescribed Depakote ER 500 mg Daily for mood stabilization, Invega Sustenna 156 mg IM Q28 Days for schizoaffective with next injection due this week, and Lexapro 10 mg PO Daily for depression and anxiety. The mother reports the patient has not had a significant benefit from the 156 mg injection. After consulting Dr. Lucianne Muss, have decided to increase the [place] to 234 mg IM Q28 Days and the mother agreed that he needed an increase. These have been ordered .

## 2019-12-30 NOTE — ED Notes (Signed)
One belongings bag placed in [place] [facility] cabinet.

## 2019-12-30 NOTE — BH Assessment (Signed)
12/30/2019: Per Reola Calkins, NP, patient meets criteria for inpatient treatment. Patient referred to the following hospitals:  [facility] Details    [facility] Details    [facility] Details    [facility] Details    [facility] Details    [facility] Details    [facility] Details    [facility] Details    [facility] Details    [facility] Details    [facility] Details    [facility] Details    [facility] Details    [facility] Details    [facility] Details    [facility] Details    [facility] Details    [facility] Details    [facility] Details    [facility] Details    [facility] Details    [facility]

## 2019-12-30 NOTE — BH Assessment (Signed)
Spoke to [place] at [facility]. She expressed interest in accepting patient. However, questioned his intellectual functioning. She also questioned if their were any incidences with feces as reported in the TTS assessment. After learning that their has been an incident patient is declined for now.   However, Delaney Meigs is willing to reconsider patient for admission tomorrow if their are not further incidences with feces.

## 2019-12-30 NOTE — ED Notes (Addendum)
Pt attempted to run out of ED multiple times. Pt redirected back to bed by staff and security. Pt given water and sandwich, but Pt refused.

## 2019-12-30 NOTE — ED Notes (Signed)
After multiple attempts at leaving department and risk of injury to self pt was escorted back to secure unit room 43 with security. Pt changed into burgundy scrubs and belongings placed in locker.

## 2019-12-31 MED ORDER — ZIPRASIDONE MESYLATE 20 MG IM SOLR: 10.0000 mg | Freq: Once | INTRAMUSCULAR | Status: DC

## 2019-12-31 NOTE — ED Notes (Addendum)
Patient is not directable. PRN meds have been given. Charge made aware we need sitter. Patient slapping himself in the face.    Maryagnes Amos, FNP paged for her note in computer. This RN has not spoken with her today.

## 2019-12-31 NOTE — ED Notes (Signed)
Patient given meal tray.

## 2019-12-31 NOTE — ED Notes (Signed)
Spoke with NP. Who states patient will need impatient placement but not at [facility] and have sent out other bed request at multiple places.

## 2019-12-31 NOTE — Progress Notes (Signed)
Patient ID: Marvin Crawford, male   DOB: [birth year], 24 y.o.   MRN: [id number] Unable to assess patient. Per staff report patient was resting and sound asleep this morning. Patient continues to be disruptive and is in need of inpatient care at this time. Per current nurse he does not respond and will look at the machine. Medications reviewed and reassessed at this time. Will continue to montior. He will need to have his Depakote levels rechecked in 3 days.

## 2019-12-31 NOTE — ED Notes (Signed)
Patient trying to walk around. This RN walked with patient to stretch his legs. Patient redirected back to bed.   Crayons and paper given to patient to color.   Bathroom offered.

## 2019-12-31 NOTE — ED Notes (Signed)
Patient placed in hospital bed for comfort.

## 2019-12-31 NOTE — ED Notes (Signed)
Sitter at bedside.

## 2020-01-01 DIAGNOSIS — F251 Schizoaffective disorder, depressive type: Secondary | ICD-10-CM

## 2020-01-01 NOTE — Consult Note (Signed)
Per further review it was determined the patient did not receive his scheduled dose of Invega Sustena 243mg  IM as ordered.  This was discussed with charge nurse and the patient's mother.  As the patient is already discharged the following discussed with concordance with patient's mother, Arta Silence  1.  [name] Invega 243mg  IM q28 days was [facility] to [facility] at [address], [place], [address]   2. Ms. Waterford will reach out to patient's outpatient provider for administrative assistance tomorrow.  IF she is unable to coordinate an appointment for the patient to receive the injection, she will bring him back to [facility] walk-in to receive the injection.  She understands if applicable, [facility] visit is for medication administration purposes and the patient can return home afterwards.

## 2020-01-01 NOTE — ED Notes (Signed)
PT continuing to rest.

## 2020-01-01 NOTE — Progress Notes (Signed)
Escorted  Marvin Crawford from the main ED to the [facility] area without incident. He was oriented to his new environment, received a snack of his choice and took a shower. At intervals he would scream out in his room, but redirectable. He slept at  short intervals throughout the night with multiple requests.

## 2020-01-01 NOTE — ED Notes (Signed)
PT was laying in hallway for a brief period, PT returned to room.

## 2020-01-01 NOTE — ED Notes (Signed)
PT refused to have blood pressure or temp taken due to PT did not move or cooperate with instruction for vitals.

## 2020-01-01 NOTE — ED Notes (Signed)
PT mumbling incoherently to self.

## 2020-01-01 NOTE — Discharge Instructions (Signed)
For your behavioral health needs, you are advised to continue treatment with your regular outpatient provider:       [facility]      [address]      [place], [place] [address]      [phone]

## 2020-01-01 NOTE — ED Notes (Signed)
Meal tray provided.

## 2020-01-01 NOTE — ED Notes (Signed)
TTS Consult cart with PT. 

## 2020-01-01 NOTE — ED Notes (Signed)
Pt DCd off unit to home. Pt calm, cooperative, no s/s of distress. DC information give to pt for family to review. Belongings given to pt. Pt ambulatory off unit, escorted by NT. Pt transported by family.

## 2020-01-01 NOTE — ED Notes (Signed)
Pt has been alert this shift. Pt calm. No s/s of distress.  Medication compliant this AM. Pt cooperative with directions.  Pt denied wanting self or others.

## 2020-01-01 NOTE — BH Assessment (Signed)
BHH Assessment Progress Note  Per Ophelia Shoulder, NP, this pt does not require psychiatric hospitalization at this time.  Pt is to be discharged from [facility] with recommendation to continue treatment with [facility].  This has been included in pt's discharge instructions.  Marlinda Mike reports that she has spoken to pt's mother who will present at [facility] to pick pt up.  Pt's nurse, Waynetta Sandy, has been notified.  Doylene Canning, MA Triage Specialist [phone]

## 2020-01-01 NOTE — BHH Suicide Risk Assessment (Cosign Needed Addendum)
Suicide Risk Assessment  Discharge Assessment   [facility] Discharge Suicide Risk Assessment   Principal Problem: Schizoaffective disorder, depressive type [facility]) Discharge Diagnoses: Principal Problem:   Schizoaffective disorder, depressive type (HCC) Active Problems:   Borderline intellectual functioning   Major depressive disorder, recurrent, severe with psychotic features (HCC)   Marvin Crawford, 24 y.o., male patient admitted for evaluation of suicidal ideation with a plan to step in front of a car.  Patient seen via telepsych by this provider and Dr. Lucianne Muss; chart reviewed on 01/01/20.  On evaluation Marvin Crawford reports, " I am ready to go home".  Per chart review, at baseline he has intellectual disabilities but he is able to appropriately make his needs known during the assessment today.  Mostly offers yes or no responses to questions but spontaneously alerts the mental health team of his desire to go home during the course of the interview.  He takes [place] 156mg  q28 days and was due for his next injection this week.  After reviewing with his mother, the dose was increased to 234mg .  He was also given prn Geodon and ativan per agitation protocol.  Today the patient demonstrates symptomatic improvement in his mood stability, no longer endorsing suicidal thoughts or audible or visual hallucinations.  Per chart review, the patient has remained calm, and no safety concerns seen in the past 24 hours.    During evaluation Marvin Crawford is seated with his legs dangled at the bedside; He is alert/oriented x 4; calm/cooperative; and mood congruent with affect.  Patient is speaking in a clear tone at moderate volume, and normal pace; with good eye contact.  His thought process is coherent and relevant; There is no indication that he is currently responding to internal/external stimuli or experiencing delusional thought content.  Patient denies suicidal/self-harm/homicidal ideation, psychosis,  and paranoia.  Patient has remained calm throughout assessment and has answered questions appropriately.    Total Time spent with patient: 30 minutes  Musculoskeletal: Strength & Muscle Tone: within normal limits Gait & Station: normal Patient leans: N/A  Psychiatric Specialty Exam:   Blood pressure 112/75, pulse 70, temperature 98.3 F (36.8 C), temperature source Oral, resp. rate 18, height 6' (1.829 m), weight 111.1 kg, SpO2 100 %.Body mass index is 33.23 kg/m.  General Appearance: Casual, Guarded and becomes more communicative within a few minutes during assessment  Eye Contact::  Fair  Speech:  Clear and Coherent, Normal Rate and patient responds to direct questions with yes or no.   Volume:  Normal  Mood:  Euthymic and appears fatigued as assessment completed in early AM  Affect:  Congruent and Restricted  Thought Process:  Coherent, Goal Directed and Descriptions of Associations: Intact  Orientation:  Full (Time, Place, and Person)  Thought Content:  Logical  Suicidal Thoughts:  No  Homicidal Thoughts:  No  Memory:  NA  Judgement:  Other:  intact as evidenced by patient responding no to suicidal or homicidal and safety questions  Insight:  Lacking and but appears baseline in patient with intellectual disabilities  Psychomotor Activity:  Normal  Concentration:  Good  Recall:  NA  Fund of Knowledge:Good  Language: Good  Akathisia:  NA  Handed:  Right  AIMS (if indicated):     Assets:  Desire for Improvement Financial Resources/Insurance Housing Social Support  Sleep:     Cognition: Impaired,  Mild appears baseline as patient has autism  ADL's:  Intact   Mental Status Per Nursing Assessment::  On Admission:     Demographic Factors:  Male  Loss Factors: NA  Historical Factors: Impulsivity and related to intellectual disabilties  Risk Reduction Factors:   Living with another person, especially a relative and Positive social support  Continued Clinical  Symptoms:  Previous Psychiatric Diagnoses and Treatments  Cognitive Features That Contribute To Risk:  Closed-mindedness    Suicide Risk:  Minimal: No identifiable suicidal ideation.  Patients presenting with no risk factors but with morbid ruminations; may be classified as minimal risk based on the severity of the depressive symptoms   Plan Of Care/Follow-up recommendations:  Plan- As per above assessment, there are no current grounds for involuntary commitment at this time. Patient is not currently interested in inpatient services, but is connected with [facility] for outpatient mental health care and will follow-up after discharged.  The patient lives with is mother,  [name], who helps coordinates his care.  Above information is communicated with her, she denies safety concerns, agrees with the plan.  She will pick the him up after she leaves work within the 2-3pm hour today.    Mallie Darting, NP 01/01/2020, 3:06 PM

## 2022-12-07 ENCOUNTER — Other Ambulatory Visit: Payer: Self-pay | Admitting: Family

## 2022-12-07 DIAGNOSIS — R1032 Left lower quadrant pain: Secondary | ICD-10-CM

## 2022-12-26 ENCOUNTER — Ambulatory Visit
Admission: RE | Admit: 2022-12-26 | Discharge: 2022-12-26 | Disposition: A | Discharge status: Home / Self Care | Payer: 59 | Source: Ambulatory Visit | Attending: Family | Admitting: Family

## 2022-12-26 DIAGNOSIS — R1032 Left lower quadrant pain: Secondary | ICD-10-CM

## 2024-05-15 ENCOUNTER — Encounter: Payer: Self-pay | Admitting: Neurology

## 2024-05-15 ENCOUNTER — Ambulatory Visit: Admitting: Neurology

## 2024-05-15 VITALS — BP 120/78 | HR 74 | Ht 73.0 in | Wt 222.5 lb

## 2024-05-15 DIAGNOSIS — R569 Unspecified convulsions: Secondary | ICD-10-CM | POA: Diagnosis not present

## 2024-05-15 DIAGNOSIS — R4189 Other symptoms and signs involving cognitive functions and awareness: Secondary | ICD-10-CM | POA: Diagnosis not present

## 2024-05-15 NOTE — Patient Instructions (Signed)
 Continue current medications  Will obtain routine EEG  Continue to follow up with PCP  Return as needed

## 2024-05-15 NOTE — Progress Notes (Signed)
 [facility]  PATIENT: Marvin Crawford DOB: July 07, 1996  REQUESTING CLINICIAN: Duwaine Annabella SAILOR, FNP HISTORY FROM: Mother and Caregiver  REASON FOR VISIT: Seizure like activity, changes in behavior    HISTORICAL  CHIEF COMPLAINT:  Chief Complaint  Patient presents with   New Patient (Initial Visit)    Pt in room 13. Jon (mom)  Dwayne ( caregiver ) in room. Paper referral for Transient Alteration of Awareness, ? Seizures.    HISTORY OF PRESENT ILLNESS:  Discussed the use of AI scribe software for clinical note transcription with the patient, who gave verbal consent to proceed.  Marvin Crawford is a 28 year old male with schizophrenia who presents with a decline in cognitive function and daily staring episodes.  Over the past two to three years, he has experienced a noticeable decline in cognitive function. Previously energetic and able to complete tasks independently, he now struggles to follow through with instructions, often pausing and not completing tasks. His communication has also changed; he no longer engages in full conversations and often responds with single words or short sentences. He occasionally follows directions but requires assistance with tasks he previously managed independently.  He experiences daily staring episodes, where he appears unresponsive and does not engage in activities. These episodes occur regularly throughout the day. During these episodes, he does not respond to verbal prompts and can remain inactive for extended periods, such as standing in the shower for an hour without washing. There is no history of seizures or family history of seizures. He has not experienced any incontinence or accidents.  He is currently on Abilify  injections, a medication he has been on for a while, though the dosage may have increased. He also takes Escitalopram  and trazodone . He was diagnosed with schizophrenia following a crisis post-COVID, during which  he exhibited self-endangering behaviors, and his medications were changed at that time.  He sleeps well and his eating habits are good, enjoying foods like pizza. There have been observations of him having internal conversations or interacting with unseen objects, though this has decreased recently. He has been seen talking to himself and making gestures as if interacting with something or someone not present.    OTHER MEDICAL CONDITIONS: Schizophrenia    REVIEW OF SYSTEMS: Full 14 system review of systems performed and negative with exception of: As noted in the HPI   ALLERGIES: Allergies  Allergen Reactions   Peanut-Containing Drug Products Other (See Comments)    Pt reports my throat swells and I have trouble swallowing    HOME MEDICATIONS: Outpatient Medications Prior to Visit  Medication Sig Dispense Refill   ABILIFY  MAINTENA 400 MG SRER injection Inject 400 mg into the muscle every 28 (twenty-eight) days.     escitalopram  (LEXAPRO ) 10 MG tablet Take 10 mg by mouth daily.     hydrOXYzine  (ATARAX /VISTARIL ) 25 MG tablet Take 1 tablet (25 mg total) by mouth 3 (three) times daily as needed for anxiety. 75 tablet 0   traZODone  (DESYREL ) 100 MG tablet Take 100 mg by mouth at bedtime as needed for sleep.     divalproex  (DEPAKOTE  ER) 500 MG 24 hr tablet Take 500 mg by mouth daily. (Patient not taking: Reported on 05/15/2024)     INVEGA  SUSTENNA 156 MG/ML SUSY injection Inject 1 Syringe into the muscle every 30 (thirty) days. (Patient not taking: Reported on 05/15/2024)     No facility-administered medications prior to visit.    PAST MEDICAL HISTORY: Past Medical History:  Diagnosis  Date   ADD (attention deficit disorder)    Anxiety    Depression    Environmental allergies    Schizo-affective psychosis (HCC)     PAST SURGICAL HISTORY: History reviewed. No pertinent surgical history.  FAMILY HISTORY: Family History  Problem Relation Age of Onset   Hypertension Mother     Hypertension Father    Mental illness Neg Hx     SOCIAL HISTORY: Social History   Socioeconomic History   Marital status: Single    Spouse name: Not on file   Number of children: Not on file   Years of education: Not on file   Highest education level: Not on file  Occupational History   Not on file  Tobacco Use   Smoking status: Every Day    Current packs/day: 2.00    Average packs/day: 2.0 packs/day for 5.3 years (10.5 ttl pk-yrs)    Types: Cigarettes    Start date: 02/05/2019   Smokeless tobacco: Never  Vaping Use   Vaping status: Never Used  Substance and Sexual Activity   Alcohol use: Yes    Alcohol/week: 1.0 standard drink of alcohol    Types: 1 Standard drinks or equivalent per week    Comment: once in a blue moon   Drug use: Yes    Types: Other-see comments, Marijuana    Comment: oil smoked in pipe calls [employer], friends buy for him   Sexual activity: Yes    Birth control/protection: Condom  Other Topics Concern   Not on file  Social History Narrative   Graduated from high school at age 26. He took IEP classes until he was in 10th grade. In 11th and 12th grade, he took normal classes and says he didn't need any extra help. He did 2 months of college at [employer], and this was difficult for him. He dropped out of college, due to difficulty balancing work and school.    Social Drivers of Health   Financial Resource Strain: Not at Risk (12/07/2022)   Received from [employer]    Financial Resource Strain: 1  Food Insecurity: Not at Risk (12/07/2022)   Received from [employer]    Food: 1  Transportation Needs: Not at Risk (12/07/2022)   Received from [employer] [facility]    Transportation: 1  Physical Activity: Not on File (11/24/2021)   Received from [facility]   Physical Activity    Physical Activity: 0  Stress: Not on File (11/24/2021)   Received from [facility]   Stress    Stress: 0  Social Connections: Not on File (04/10/2023)    Received from [employer]   Social Connections    Connectedness: 0  Intimate Partner Violence: Unknown (11/10/2021)   Received from [facility]   HITS    Physically Hurt: Not on file    Insult or Talk Down To: Not on file    Threaten Physical Harm: Not on file    Scream or Curse: Not on file    PHYSICAL EXAM  GENERAL EXAM/CONSTITUTIONAL: Vitals:  Vitals:   05/15/24 1510  BP: 120/78  Pulse: 74  Weight: 222 lb 8 oz (100.9 kg)  Height: 6' 1 (1.854 m)   Body mass index is 29.36 kg/m. Wt Readings from Last 3 Encounters:  05/15/24 222 lb 8 oz (100.9 kg)  12/29/19 245 lb (111.1 kg)  10/18/18 253 lb 8.5 oz (115 kg)   Patient is in no distress; well developed, nourished and  groomed; neck is supple  MUSCULOSKELETAL: Gait, strength, tone, movements noted in Neurologic exam below  NEUROLOGIC: MENTAL STATUS:      No data to display         awake, alert, oriented  Not provided any history  Delay in response  Was not able to tell me his age or DOB but was able to tell caregiver DOB when she asked.   MOTOR:  normal bulk and tone, full strength in the BUE, BLE  GAIT/STATION:  normal   DIAGNOSTIC DATA (LABS, IMAGING, TESTING) - I reviewed patient records, labs, notes, testing and imaging myself where available.  Lab Results  Component Value Date   WBC 6.6 12/29/2019   HGB 13.0 12/29/2019   HCT 37.8 (L) 12/29/2019   MCV 85.5 12/29/2019   PLT 259 12/29/2019      Component Value Date/Time   NA 137 12/29/2019 2223   K 3.8 12/29/2019 2223   CL 103 12/29/2019 2223   CO2 26 12/29/2019 2223   GLUCOSE 93 12/29/2019 2223   BUN 12 12/29/2019 2223   CREATININE 0.81 12/29/2019 2223   CREATININE 1.01 04/21/2015 1000   CALCIUM 9.5 12/29/2019 2223   PROT 6.8 12/29/2019 2223   ALBUMIN 4.0 12/29/2019 2223   AST 41 12/29/2019 2223   ALT 49 (H) 12/29/2019 2223   ALKPHOS 65 12/29/2019 2223   BILITOT 0.5 12/29/2019 2223   GFRNONAA >60 12/29/2019 2223   GFRAA >60 12/29/2019 2223    Lab Results  Component Value Date   CHOL 190 06/22/2019   HDL 41 06/22/2019   LDLCALC 137 (H) 06/22/2019   TRIG 60 06/22/2019   CHOLHDL 4.6 06/22/2019   Lab Results  Component Value Date   HGBA1C 5.5 05/16/2019   No results found for: VITAMINB12 Lab Results  Component Value Date   TSH 1.012 06/22/2019      ASSESSMENT AND PLAN  28 y.o. year old male with schizophrenia who present with changes in behavior and staring episodes   Cognitive decline with behavioral changes and staring episodes Cognitive decline and behavioral changes over the past two to three years, characterized by decreased responsiveness, staring episodes, and lack of task completion. Staring episodes occur daily and are prolonged, with no associated motor activity. No history of seizures or family history of seizures. Differential diagnosis includes seizure activity, though suspicion is low due to lack of convulsive episodes. - Order EEG to evaluate for seizure activity - I will contact you to go over the results  - Continue with follow up with PCP and psychiatry   Schizophrenia Diagnosed with schizophrenia prior to the onset of current symptoms. Currently on Abilify  injection and trazodone . No recent changes in medication other than a possible increase in Abilify  dosage. No evidence of auditory or visual hallucinations currently, though past behavior included internal conversations and hallucinations. Behavioral changes may be related to schizophrenia or medication effects.    1. Seizure-like activity (HCC)   2. Cognitive impairment      Patient Instructions  Continue current medications  Will obtain routine EEG  Continue to follow up with PCP  Return as needed   Orders Placed This Encounter  Procedures   EEG adult    No orders of the defined types were placed in this encounter.   Return if symptoms worsen or fail to improve.    Pastor Falling, MD 05/15/2024, 4:19 PM  [facility] [address] [place], [place] [address] [phone]

## 2024-06-11 ENCOUNTER — Other Ambulatory Visit: Admitting: *Deleted

## 2024-06-26 ENCOUNTER — Ambulatory Visit: Admitting: Neurology

## 2024-06-26 ENCOUNTER — Encounter: Payer: Self-pay | Admitting: *Deleted

## 2024-06-26 ENCOUNTER — Ambulatory Visit: Payer: Self-pay | Admitting: Neurology

## 2024-06-26 DIAGNOSIS — R569 Unspecified convulsions: Secondary | ICD-10-CM | POA: Diagnosis not present

## 2024-06-26 DIAGNOSIS — R4189 Other symptoms and signs involving cognitive functions and awareness: Secondary | ICD-10-CM

## 2024-06-26 NOTE — Progress Notes (Signed)
 Please and inform patient that her EEG (Brain wave test) was normal. In particular, there were no epileptiform discharges and no seizures. No further action is required on this test at this time. Please keep any upcoming appointments or tests and  call us  with any interim questions, concerns, problems or updates. Thanks,   Pastor Falling, MD

## 2024-06-26 NOTE — Procedures (Signed)
    History:  28 year old man with seizure like activity 12  EEG classification: Awake and drowsy  Duration: 30 minutes   Technical aspects: This EEG study was done with scalp electrodes positioned according to the 10-20 International system of electrode placement. Electrical activity was reviewed with band pass filter of 1-70Hz , sensitivity of 7 uV/mm, display speed of 76mm/sec with a 60Hz  notched filter applied as appropriate. EEG data were recorded continuously and digitally stored.   Description of the recording: The background rhythms of this recording consists of a fairly well modulated medium amplitude alpha rhythm of 12 Hz that is reactive to eye opening and closure. Present in the anterior head region is a 15-20 Hz beta activity. Photic stimulation was performed, did not show any abnormalities. Hyperventilation was also performed, did not show any abnormalities. Drowsiness was not seen. No abnormal epileptiform discharges seen during this recording. There was no focal slowing. There were no electrographic seizure identified.   Abnormality: None   Impression: This is a normal awake EEG. No evidence of interictal epileptiform discharges. Normal EEGs, however, do not rule out epilepsy.    Thursa Emme, MD [facility]

## 2024-07-20 ENCOUNTER — Ambulatory Visit (HOSPITAL_COMMUNITY)
Admission: EM | Admit: 2024-07-20 | Discharge: 2024-07-20 | Disposition: A | Discharge status: Home / Self Care | Attending: Nurse Practitioner | Admitting: Nurse Practitioner

## 2024-07-20 DIAGNOSIS — F32A Depression, unspecified: Secondary | ICD-10-CM | POA: Diagnosis present

## 2024-07-20 DIAGNOSIS — F209 Schizophrenia, unspecified: Secondary | ICD-10-CM | POA: Diagnosis not present

## 2024-07-20 NOTE — Progress Notes (Signed)
°   07/20/24 1416  [facility] Triage Screening (Walk-ins at [facility] only)  How Did You Hear About Us ? Self  What Is the Reason for Your Visit/Call Today? Patient is a 28 year old male that presents this date with his mother (guardian) Marvin Crawford [phone] with patient reporting he, doesn't feel well. Mother states that patient has a PMHx significant for Schizophrenia and has been residing with his caregiver Marvin Crawford for the last 4 years. Mother states she went to pick him up today for an outing and patient kept repeating, hospital, but was unable to vocalize his symptoms. Mother states patient is on a LAI (Abilify ) and is scheduled for his next shot on 12/22. Patient appears to be processing the content of this writer's questions and denies any SI, HI or AVH. When asked if he is in physical pain patient is observed to be pointing to his chest although does not appear to be in distress. Mother states she brought patient in to be evaluated for mental health symptoms stating he has voiced somatic complaints in the past when he was actually experiencing MH symptoms.  How Long Has This Been Causing You Problems? <Week  Have You Recently Had Any Thoughts About Hurting Yourself? No  Are You Planning to Commit Suicide/Harm Yourself At This time? No  Have you Recently Had Thoughts About Hurting Someone Sherral? No  Are You Planning To Harm Someone At This Time? No  Physical Abuse Denies  Verbal Abuse Denies  Sexual Abuse Denies  Exploitation of patient/patient's resources Denies  Self-Neglect Denies  Possible abuse reported to:  (NA)  Are you currently experiencing any auditory, visual or other hallucinations? No  Have You Used Any Alcohol or Drugs in the Past 24 Hours? No  Do you have any current medical co-morbidities that require immediate attention? No  Clinician description of patient physical appearance/behavior: Patient is cooperative  What Do You Feel Would Help You the Most Today? Medication(s)   If access to [facility] was not available, would you have sought care in the Emergency Department? No  Determination of Need Routine (7 days)  Options For Referral Other: Comment (To be determined)

## 2024-07-20 NOTE — Discharge Instructions (Signed)

## 2024-07-20 NOTE — ED Provider Notes (Signed)
 Behavioral Health Urgent Care Medical Screening Exam  Patient Name: Marvin Crawford MRN: [id number] Date of Evaluation: 07/20/2024 Chief Complaint:  Needing mental health evaluation as per mother. Diagnosis:  Final diagnoses:  Mild episode of depression   History of Present illness: Marvin Crawford is a 28 y.o. male who presents to the [facility] with his mother seeking a mental health evaluation. Per triage:  Patient is a 28 year old male that presents this date with his mother (guardian) Jon Sabal [phone] with patient reporting he, doesn't feel well. Mother states that patient has a PMHx significant for Schizophrenia and has been residing with his caregiver Jerel Poli for the last 4 years. Mother states she went to pick him up today for an outing and patient kept repeating, hospital, but was unable to vocalize his symptoms. Mother states patient is on a LAI (Abilify ) and is scheduled for his next shot on 12/22. Patient appears to be processing the content of this writer's questions and denies any SI, HI or AVH.   Assessment: During encounter with patient, he denies suicidal ideations, denies homicidal ideations, denies auditory or visual hallucinations.  Denies paranoia, denies delusional thinking, denies first rank symptoms.  Denies all other psychotic symptoms.  Patient appears slightly guarded, but mother states that this is his typical presentation.  Patient denies depressive symptoms with the exception of anhedonia and sleep; shares that he has trouble staying asleep, reports that he typically enjoys working, has not worked since [employer], and misses it. 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  Patient's mother inquired on how she can get patient back to the community and working, since it gave him a sense of purpose in the past.  Recommendations given  for the assertive community treatment team, and resources placed in discharge AVS. Mother shares that pt has community services in place  for med mgt , and therapy and currently resides with an [facility] porovider, and that she has no concerns regarding the environment where he resides. Denies substance use of any type in patient.  Suicide Risk:  Mild:  There are no identifiable suicide plans, no associated intent, mild dysphoria and related symptoms, good self-control (both objective and subjective assessment), few other risk factors, and identifiable protective factors, including available and accessible social support.   Plan: Discharge with community support.  Flowsheet Row ED from 07/20/2024 in [facility] Admission (Discharged) from OP [facility] Ad[facility] (Discharged) from OP [facility]  C-SSRS RISK CATEGORY No Risk High Risk High Risk    Psychiatric Specialty Exam  Presentation  General Appearance:Appropriate for Environment; Fairly Groomed  Eye Contact:Fair  It Sales Professional Volume:Normal  Handedness:Right   Mood and Affect  Mood:Depressed  Affect:Congruent   Thought Process  Thought Processes:Coherent  Descriptions of Associations:Intact  Orientation:Full (Time, Place and Person)  Thought Content:Logical    Hallucinations:None  Ideas of Reference:None  Suicidal Thoughts:No  Homicidal Thoughts:No   Sensorium  Memory:Immediate Fair  Judgment:Fair  Insight:Fair  Executive Functions  Concentration:Fair  Attention Span:Fair  Recall:Fair  Fund of Knowledge:Fair  Language:Fair  Psychomotor Activity  Psychomotor Activity:Normal  Assets  Assets:Resilience  Sleep  Sleep:Fair  Number of hours: No data recorded  Physical Exam: Physical Exam Constitutional:  Appearance: Normal  appearance.  Musculoskeletal:     Cervical back: Normal range of motion.  Neurological:     Mental Status: He is alert.  Psychiatric:        Mood and Affect: Mood normal.        Behavior: Behavior normal.        Thought Content: Thought content normal.        Judgment: Judgment normal.    Review of Systems  Psychiatric/Behavioral:  Positive for depression. Negative for hallucinations, substance abuse and suicidal ideas. The patient has insomnia. The patient is not nervous/anxious.    Blood pressure 122/77, pulse 90, temperature 98.9 F (37.2 C), temperature source Oral, resp. rate 18, SpO2 100%. There is no height or weight on file to calculate BMI.  Musculoskeletal: Strength & Muscle Tone: within normal limits Gait & Station: normal Patient leans: N/A   [facility] MSE Discharge Disposition for Follow up and Recommendations: Based on my evaluation the patient does not appear to have an emergency medical condition and can be discharged with resources and follow up care in outpatient services for Medication Management, Individual Therapy, and Recommended ACTT to assistance with peer support as well as assistance with vocational concerns such as a job.   Donia Snell, NP 07/20/2024, 4:48 PM
